# Patient Record
Sex: Male | Born: 1945 | Race: White | Hispanic: No | Marital: Married | Smoking: Former smoker
Health system: Southern US, Community
[De-identification: ages and names within clinical notes are randomized; demographics above are authoritative.]

## PROBLEM LIST (undated history)

## (undated) DIAGNOSIS — K219 Gastro-esophageal reflux disease without esophagitis: Secondary | ICD-10-CM

## (undated) DIAGNOSIS — E119 Type 2 diabetes mellitus without complications: Secondary | ICD-10-CM

## (undated) DIAGNOSIS — E539 Vitamin B deficiency, unspecified: Secondary | ICD-10-CM

## (undated) DIAGNOSIS — J449 Chronic obstructive pulmonary disease, unspecified: Secondary | ICD-10-CM

## (undated) DIAGNOSIS — L308 Other specified dermatitis: Secondary | ICD-10-CM

## (undated) DIAGNOSIS — N182 Chronic kidney disease, stage 2 (mild): Secondary | ICD-10-CM

## (undated) DIAGNOSIS — E559 Vitamin D deficiency, unspecified: Secondary | ICD-10-CM

## (undated) DIAGNOSIS — I10 Essential (primary) hypertension: Secondary | ICD-10-CM

## (undated) DIAGNOSIS — S7291XA Unspecified fracture of right femur, initial encounter for closed fracture: Secondary | ICD-10-CM

## (undated) DIAGNOSIS — C3490 Malignant neoplasm of unspecified part of unspecified bronchus or lung: Secondary | ICD-10-CM

## (undated) DIAGNOSIS — I77811 Abdominal aortic ectasia: Secondary | ICD-10-CM

## (undated) DIAGNOSIS — C61 Malignant neoplasm of prostate: Secondary | ICD-10-CM

## (undated) DIAGNOSIS — F1911 Other psychoactive substance abuse, in remission: Secondary | ICD-10-CM

## (undated) DIAGNOSIS — E785 Hyperlipidemia, unspecified: Secondary | ICD-10-CM

## (undated) DIAGNOSIS — F1021 Alcohol dependence, in remission: Secondary | ICD-10-CM

## (undated) DIAGNOSIS — S72009A Fracture of unspecified part of neck of unspecified femur, initial encounter for closed fracture: Secondary | ICD-10-CM

## (undated) DIAGNOSIS — G479 Sleep disorder, unspecified: Secondary | ICD-10-CM

## (undated) HISTORY — DX: Sleep disorder, unspecified: G47.9

## (undated) HISTORY — DX: Essential (primary) hypertension: I10

## (undated) HISTORY — DX: Gastro-esophageal reflux disease without esophagitis: K21.9

## (undated) HISTORY — DX: Hyperlipidemia, unspecified: E78.5

## (undated) HISTORY — DX: Unspecified fracture of right femur, initial encounter for closed fracture: S72.91XA

## (undated) HISTORY — DX: Other specified dermatitis: L30.8

## (undated) HISTORY — PX: HIP FRACTURE SURGERY: SHX118

## (undated) HISTORY — DX: Fracture of unspecified part of neck of unspecified femur, initial encounter for closed fracture: S72.009A

## (undated) HISTORY — PX: FEMUR FRACTURE SURGERY: SHX633

## (undated) HISTORY — DX: Type 2 diabetes mellitus without complications: E11.9

## (undated) HISTORY — PX: TIBIA FRACTURE SURGERY: SHX806

## (undated) HISTORY — DX: Vitamin D deficiency, unspecified: E55.9

## (undated) HISTORY — PX: COLONOSCOPY: SHX174

## (undated) HISTORY — DX: Vitamin B deficiency, unspecified: E53.9

## (undated) HISTORY — PX: JOINT REPLACEMENT: SHX530

## (undated) HISTORY — DX: Other psychoactive substance abuse, in remission: F19.11

---

## 1997-05-11 DIAGNOSIS — F1911 Other psychoactive substance abuse, in remission: Secondary | ICD-10-CM

## 1997-05-11 HISTORY — DX: Other psychoactive substance abuse, in remission: F19.11

## 1998-03-05 ENCOUNTER — Encounter: Admission: RE | Admit: 1998-03-05 | Discharge: 1998-06-03 | Payer: Self-pay | Admitting: Family Medicine

## 2004-09-26 ENCOUNTER — Ambulatory Visit (HOSPITAL_COMMUNITY): Admission: RE | Admit: 2004-09-26 | Discharge: 2004-09-26 | Payer: Self-pay | Admitting: Gastroenterology

## 2006-02-18 ENCOUNTER — Encounter: Admission: RE | Admit: 2006-02-18 | Discharge: 2006-05-19 | Payer: Self-pay | Admitting: Family Medicine

## 2006-07-06 ENCOUNTER — Encounter: Admission: RE | Admit: 2006-07-06 | Discharge: 2006-10-04 | Payer: Self-pay | Admitting: Family Medicine

## 2006-11-09 ENCOUNTER — Encounter: Admission: RE | Admit: 2006-11-09 | Discharge: 2006-11-09 | Payer: Self-pay | Admitting: Family Medicine

## 2009-02-22 ENCOUNTER — Encounter: Admission: RE | Admit: 2009-02-22 | Discharge: 2009-02-22 | Payer: Self-pay | Admitting: Family Medicine

## 2010-09-26 NOTE — Op Note (Signed)
NAMESEYON, Alan Hurst NO.:  [id number]   MEDICAL RECORD NO.:  [id number]          PATIENT TYPE:  AMB   LOCATION:  ENDO                         FACILITY:  [facility]   PHYSICIAN:  John C. Madilyn Fireman, M.D.    DATE OF BIRTH:  08-06-1945   DATE OF PROCEDURE:  09/26/2004  DATE OF DISCHARGE:                                 OPERATIVE REPORT   PROCEDURE:  Colonoscopy.   INDICATIONS FOR PROCEDURE:  Average risk colon cancer screening.   PROCEDURE:  The patient was placed in the left lateral decubitus position  and placed on the pulse monitor with continuous low-flow oxygen delivered by  nasal cannula and 75 mcg IV fentanyl and 7.5 milligrams IV Versed. Olympus  video colonoscope was inserted into the rectum and advanced to cecum,  confirmed by transillumination of McBurney's point and visualization of  ileocecal valve and appendiceal orifice. Prep was good. The cecum,  ascending, transverse, descending, sigmoid and rectum all appeared normal  with no masses, polyps, diverticula or other mucosal abnormalities. The  rectum appeared normal down to the anus where retroflexed view revealed  small internal hemorrhoids. The scope was then withdrawn and the patient  returned to the recovery room in stable condition.  He tolerated the  procedure well. There were no immediate complications.   IMPRESSION:  Internal hemorrhoids, otherwise normal study.   PLAN:  Next colonoscopy within 10 years, and consider sigmoidoscopy or  Hemoccults and five years.      JCH/MEDQ  D:  09/26/2004  T:  09/26/2004  Job:  [id number]   cc:   Talmadge Coventry, M.D.  [address]  [place]  [place] [address]  Fax: [phone]

## 2011-05-14 ENCOUNTER — Ambulatory Visit (INDEPENDENT_AMBULATORY_CARE_PROVIDER_SITE_OTHER): Payer: Medicare Other

## 2011-05-14 DIAGNOSIS — R319 Hematuria, unspecified: Secondary | ICD-10-CM

## 2011-05-14 DIAGNOSIS — J111 Influenza due to unidentified influenza virus with other respiratory manifestations: Secondary | ICD-10-CM

## 2011-05-14 DIAGNOSIS — M549 Dorsalgia, unspecified: Secondary | ICD-10-CM

## 2015-06-28 ENCOUNTER — Encounter (HOSPITAL_COMMUNITY): Payer: Self-pay | Admitting: *Deleted

## 2015-06-28 ENCOUNTER — Emergency Department (HOSPITAL_COMMUNITY)
Admission: EM | Admit: 2015-06-28 | Discharge: 2015-06-28 | Disposition: A | Discharge status: Home / Self Care | Payer: Medicare Other | Attending: Physician Assistant | Admitting: Physician Assistant

## 2015-06-28 DIAGNOSIS — H5711 Ocular pain, right eye: Secondary | ICD-10-CM | POA: Diagnosis present

## 2015-06-28 DIAGNOSIS — H53149 Visual discomfort, unspecified: Secondary | ICD-10-CM | POA: Diagnosis not present

## 2015-06-28 MED ORDER — FLUORESCEIN SODIUM 1 MG OP STRP
1.0000 | ORAL_STRIP | Freq: Once | OPHTHALMIC | Status: DC
  Filled 2015-06-28: qty 1

## 2015-06-28 MED ORDER — ERYTHROMYCIN 5 MG/GM OP OINT: TOPICAL_OINTMENT | OPHTHALMIC | Status: DC

## 2015-06-28 MED ORDER — ERYTHROMYCIN 5 MG/GM OP OINT
TOPICAL_OINTMENT | Freq: Once | OPHTHALMIC | Status: AC
  Administered 2015-06-28: 17:00:00 via OPHTHALMIC
  Filled 2015-06-28: qty 3.5

## 2015-06-28 MED ORDER — TETRACAINE HCL 0.5 % OP SOLN
2.0000 [drp] | Freq: Once | OPHTHALMIC | Status: DC
  Filled 2015-06-28: qty 4

## 2015-06-28 NOTE — Discharge Instructions (Signed)
You have been seen today for eye pain. Your exam showed no abnormalities. Follow up with ophthalmology as soon as possible. Call the office for an appointment as soon as you leave the ED. Follow up with PCP as needed. Return to ED should symptoms worsen. Use the erythromycin ointment 4 times a day for the next 5 days. Ibuprofen or Tylenol may be used for pain.

## 2015-06-28 NOTE — ED Notes (Signed)
Pt thinks he may have scratched rt eye in sleep Monday night, has had pain since,

## 2015-06-28 NOTE — ED Provider Notes (Signed)
CSN: [id number]     Arrival date & time 06/28/15  1326 History  By signing my name below, I, Rayna Sexton, attest that this documentation has been prepared under the direction and in the presence of Shawn C. Joy, PA-C. Electronically Signed: Rayna Sexton, ED Scribe. 06/28/2015. 3:24 PM.    Chief Complaint  Patient presents with  . Eye Pain   The history is provided by the patient. No language interpreter was used.    HPI Comments: Alan Hurst is a 70 y.o. male with a PMHx of pre-diabetes who presents to the Emergency Department complaining of constant, waxing and waning, 3/10, right eye pain onset 4 days ago. Pt is unsure of the cause of his pain noting that it intermittently worsens from his current 3/10 pain to 8/10. He reports associated eye watering and photophobia. He wears prescription eyewear but denies wearing contacts. Pt denies a hx of similar symptoms. Pt last saw his optometrist 8 months ago (Dr. Jodene Nam) whom he sees annually. He denies a PMHx of stroke or HTN. He denies HA or any other associated symptoms at this time.    History reviewed. No pertinent past medical history. Past Surgical History  Procedure Laterality Date  . Joint replacement     No family history on file. Social History  Substance Use Topics  . Smoking status: Never Smoker   . Smokeless tobacco: None  . Alcohol Use: No    Review of Systems  Constitutional: Negative for fever and chills.  Eyes: Positive for photophobia and pain. Negative for discharge, redness, itching and visual disturbance.  Neurological: Negative for headaches.    Allergies  Review of patient's allergies indicates no known allergies.  Home Medications   Prior to Admission medications   Medication Sig Start Date End Date Taking? Authorizing Provider  erythromycin ophthalmic ointment Place a 1/2 inch ribbon of ointment into the lower eyelid. Apply 4 times a day for the next 5 days. 06/28/15   Shawn C Joy, PA-C    BP 155/81 mmHg  Pulse 84  Temp(Src) 97.4 F (36.3 C) (Oral)  Resp 16  SpO2 98%    Physical Exam  Constitutional: He is oriented to person, place, and time. He appears well-developed and well-nourished.  HENT:  Head: Normocephalic and atraumatic.  No periorbital edema.  Eyes: Conjunctivae and EOM are normal. Pupils are equal, round, and reactive to light. Right eye exhibits no discharge.  Increased pain with leftward gaze of the right eye Tonometer reading: Left eye: 14 mmHg  Right eye: 16 mmHg [place] Lamp exam shows no increased uptake of fluorescein. Slit lamp exam was also performed with no signs of corneal abrasion or ulcer, iritis, anterior chamber damage, or globe damage.     Visual Acuity  Right Eye Distance: 20/40 Left Eye Distance: 20/70 Bilateral Distance:    Right Eye Near:   Left Eye Near:    Bilateral Near:       Neck: Normal range of motion. Neck supple.  Cardiovascular: Normal rate.   Pulmonary/Chest: Effort normal. No respiratory distress.  Neurological: He is alert and oriented to person, place, and time.  Skin: Skin is warm and dry.  Psychiatric: He has a normal mood and affect.  Nursing note and vitals reviewed.   ED Course  Procedures  DIAGNOSTIC STUDIES: Oxygen Saturation is 98% on RA, normal by my interpretation.    COORDINATION OF CARE: 3:20 PM Discussed next steps with pt. He verbalized understanding and is agreeable with the  plan.   SLIT LAMP EXAM: Tetracaine 2 drops used.  Lids everted and swept for exam, no evidence of foreign body.  Conjunctivae: No injection Cornea: No evidence of abrasion or corneal ulcer EOM: Intact  Pupils: PERRL  Fluorescein uptake: none   Patient tolerated procedure well without immediate complications.     Labs Review Labs Reviewed - No data to display  Imaging Review No results found.   EKG Interpretation None      MDM   Final diagnoses:  Eye pain, right    [name] presents with  right eye pain for the last 5 days.  Findings and plan of care discussed with Courteney Lyn Mackuen, MD.   No specific abnormal findings on physical or slit lamp exam. Normal pressures. No evidence of corneal ulcer, acute angle glaucoma, infarction, or any other serious issues.  Erythromycin ointment and follow-up with ophthalmology.  I personally performed the services described in this documentation, which was scribed in my presence. The recorded information has been reviewed and is accurate.   Lorayne Bender, PA-C 06/28/15 [place], MD 06/28/15 2246

## 2015-12-05 ENCOUNTER — Ambulatory Visit (INDEPENDENT_AMBULATORY_CARE_PROVIDER_SITE_OTHER): Payer: Medicare Other | Admitting: Family Medicine

## 2015-12-05 ENCOUNTER — Encounter: Payer: Self-pay | Admitting: Family Medicine

## 2015-12-05 VITALS — BP 112/71 | HR 91 | Ht 68.0 in | Wt 200.8 lb

## 2015-12-05 DIAGNOSIS — N183 Chronic kidney disease, stage 3 unspecified: Secondary | ICD-10-CM | POA: Insufficient documentation

## 2015-12-05 DIAGNOSIS — K219 Gastro-esophageal reflux disease without esophagitis: Secondary | ICD-10-CM

## 2015-12-05 DIAGNOSIS — L308 Other specified dermatitis: Secondary | ICD-10-CM | POA: Insufficient documentation

## 2015-12-05 DIAGNOSIS — Z87891 Personal history of nicotine dependence: Secondary | ICD-10-CM | POA: Insufficient documentation

## 2015-12-05 DIAGNOSIS — F1021 Alcohol dependence, in remission: Secondary | ICD-10-CM | POA: Insufficient documentation

## 2015-12-05 DIAGNOSIS — E669 Obesity, unspecified: Secondary | ICD-10-CM

## 2015-12-05 DIAGNOSIS — E0800 Diabetes mellitus due to underlying condition with hyperosmolarity without nonketotic hyperglycemic-hyperosmolar coma (NKHHC): Secondary | ICD-10-CM

## 2015-12-05 DIAGNOSIS — E785 Hyperlipidemia, unspecified: Secondary | ICD-10-CM | POA: Diagnosis not present

## 2015-12-05 DIAGNOSIS — E119 Type 2 diabetes mellitus without complications: Secondary | ICD-10-CM | POA: Insufficient documentation

## 2015-12-05 DIAGNOSIS — E539 Vitamin B deficiency, unspecified: Secondary | ICD-10-CM

## 2015-12-05 DIAGNOSIS — E559 Vitamin D deficiency, unspecified: Secondary | ICD-10-CM

## 2015-12-05 DIAGNOSIS — G479 Sleep disorder, unspecified: Secondary | ICD-10-CM | POA: Diagnosis not present

## 2015-12-05 HISTORY — DX: Other specified dermatitis: L30.8

## 2015-12-05 HISTORY — DX: Vitamin B deficiency, unspecified: E53.9

## 2015-12-05 HISTORY — DX: Alcohol dependence, in remission: F10.21

## 2015-12-05 HISTORY — DX: Vitamin D deficiency, unspecified: E55.9

## 2015-12-05 HISTORY — DX: Gastro-esophageal reflux disease without esophagitis: K21.9

## 2015-12-05 HISTORY — DX: Hyperlipidemia, unspecified: E78.5

## 2015-12-05 HISTORY — DX: Sleep disorder, unspecified: G47.9

## 2015-12-05 LAB — POCT GLYCOSYLATED HEMOGLOBIN (HGB A1C): HEMOGLOBIN A1C: 6

## 2015-12-05 NOTE — Progress Notes (Signed)
New patient office visit note:  Impression and Recommendations:    1. Diabetes mellitus due to underlying condition with hyperosmolarity without coma, without long-term current use of insulin ([place])   2. HLD (hyperlipidemia)   3. Gastroesophageal reflux disease, esophagitis presence not specified   4. Sleep disorder   5. Vitamin D insufficiency   6. Vitamin B deficiency   7. Recovering alcoholic in remission ([place])   8. History of tobacco abuse   9. Obese      Point of care A1c in the office today showed an A1c of 6.0.  Patient told me he prefers to stay on at least one tablet a day of the metformin. Continue to monitor sugars  Continue lovastatin and fish oil until we obtain values; prudent diet discussed  Continue omeprazole, symptoms well-controlled.  Melatonin works well, continue  Continue vitamin D supplementation. We will check labs in near future  Check vitamin B12 levels in the near future  We'll check liver enzymes etc.  Patient will get me med records from prior PCP to ensure he had for instance AAA ultrasound screening, low dose CT scan etc. Etc.  Prudent diet and exercise discussed with patient  Return in about 3 weeks (around 12/26/2015) for Fasting blood work, then OV with me one wk later..  The patient was counseled, risk factors were discussed, anticipatory guidance given.  Gross side effects, risk and benefits, and alternatives of medications discussed with patient.  Patient is aware that all medications have potential side effects and we are unable to predict every side effect or drug-drug interaction that may occur.  Expresses verbal understanding and consents to current therapy plan and treatment regimen.  Please see AVS handed out to patient at the end of our visit for further patient instructions/ counseling done pertaining to today's office visit.    Note: This document was prepared using Dragon voice recognition software and may include  unintentional dictation errors.  ----------------------------------------------------------------------------------------------------------------------    Subjective:    Chief Complaint  Patient presents with  . Establish Care    HPI: Alan Hurst is a pleasant 70 y.o. male who presents to [place] [facility] today to review their medical history with me and establish care.    Prior primary care doctor [facility] reg physicians [place].   Dm A1c lab- done about 3 months- have been under 6.2 over the past year. No high or lows.  Denies any diabetic sequela such as neuropathy, renal damage or visual changes etc.  Patient does not engage in regular exercise.  No other specialists besides eye doc.   Denies history of hypertension   Patient Active Problem List   Diagnosis Date Noted  . Diabetes mellitus 12/05/2015  . HLD (hyperlipidemia) 12/05/2015  . GERD (gastroesophageal reflux disease) 12/05/2015  . 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 Vitamin D insufficiency 12/05/2015  . Vitamin B deficiency 12/05/2015  . Recovering alcoholic in remission ([place]) 12/05/2015  . History of tobacco abuse 12/05/2015  . Obese 12/05/2015     Past Medical History:  Diagnosis Date  . Diabetes mellitus 12/05/2015   10 yrs- well controlled, no sequelae  . Diabetes mellitus without complication ([place])   . GERD (gastroesophageal reflux disease) 12/05/2015   Well controlled on meds  . HLD (hyperlipidemia) 12/05/2015   10+ yrs at least.   . Hyperlipidemia   . 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 Sleep disorder  12/05/2015  . Vitamin B deficiency 12/05/2015  . Vitamin D insufficiency 12/05/2015     Past Surgical History:  Procedure Laterality Date  . JOINT REPLACEMENT Bilateral    hips     Family History  Problem Relation Age of Onset  . Alcohol abuse Mother   . Alcohol abuse Father   . Congestive Heart Failure Father   . Kidney  disease Father      History  Drug Use No    History  Alcohol Use No    History  Smoking Status  . Former Smoker  . Quit date: 05/11/1993  Smokeless Tobacco  . Never Used    Patient's Medications  New Prescriptions   No medications on file  Previous Medications   ASPIRIN EC 81 MG TABLET    Take 1 tablet by mouth daily.   B COMPLEX VITAMINS (VITAMIN-B COMPLEX) TABS    Take 1 tablet by mouth daily.   CHOLECALCIFEROL (VITAMIN D) 2000 UNITS TABLET    Take 1 tablet by mouth daily.   LOVASTATIN (MEVACOR) 40 MG TABLET    Take 40 mg by mouth daily.   MELATONIN 1 MG TABS    Take 3 tablets by mouth at bedtime.   METFORMIN (GLUCOPHAGE) 500 MG TABLET    Take 500 mg by mouth daily.   OMEGA-3 FATTY ACIDS (FISH OIL) 1000 MG CAPS    Take 1 capsule by mouth daily.   OMEPRAZOLE (PRILOSEC) 20 MG CAPSULE    Take 20 mg by mouth daily.  Modified Medications   No medications on file  Discontinued Medications   ERYTHROMYCIN OPHTHALMIC OINTMENT    Place a 1/2 inch ribbon of ointment into the lower eyelid. Apply 4 times a day for the next 5 days.    Allergies: Review of patient's allergies indicates no known allergies.     ROS Review of Systems: General:   Denies fever, chills, unexplained weight loss.  Optho/Auditory:   Denies visual changes, blurred vision/LOV Respiratory:   Denies SOB, DOE.  Cardiovascular:   Denies chest pain, palpitations, new onset peripheral edema  Gastrointestinal:   Denies nausea, vomiting, diarrhea.  Genitourinary:    Denies dysuria, increased frequency, flank pain.  Endocrine:     Denies hot or cold intolerance, polyuria, polydipsia. Musculoskeletal:   Denies unexplained myalgias, joint swelling, unexplained arthralgias, gait problems.  Skin:  Denies rash, suspicious lesions Neurological:     Denies dizziness, unexplained weakness, lightheadedness, numbness  Psychiatric/Behavioral:   Denies mood changes, suicidal or homicidal ideations,  hallucinations  Objective:   Blood pressure 112/71, pulse 91, height '5\' 8"'$  (1.727 m), weight 200 lb 12.8 oz (91.1 kg). Body mass index is 30.53 kg/m.   General: Well Developed, well nourished, and in no acute distress.  Neuro: Alert and oriented x3, extra-ocular muscles intact, sensation grossly intact.  HEENT: Normocephalic, atraumatic, pupils equal round reactive to light, neck supple, no gross masses, no carotid bruits, no JVD apprec Skin: no gross suspicious lesions or rashes  Cardiac: Regular rate and rhythm, no murmurs rubs or gallops.  Respiratory: Essentially clear to auscultation bilaterally. Not using accessory muscles, speaking in full sentences.  Abdominal: Soft, not grossly distended Musculoskeletal: Ambulates w/o diff, FROM * 4 ext.  Vasc: less 2 sec cap RF, warm and pink  Psych:  No HI/SI, judgement and insight good.

## 2015-12-05 NOTE — Patient Instructions (Signed)
Diabetes and Exercise Exercising regularly is important. It is not just about losing weight. It has many health benefits, such as:  Improving your overall fitness, flexibility, and endurance.  Increasing your bone density.  Helping with weight control.  Decreasing your body fat.  Increasing your muscle strength.  Reducing stress and tension.  Improving your overall health. People with diabetes who exercise gain additional benefits because exercise:  Reduces appetite.  Improves the body's use of blood sugar (glucose).  Helps lower or control blood glucose.  Decreases blood pressure.  Helps control blood lipids (such as cholesterol and triglycerides).  Improves the body's use of the hormone insulin by:  Increasing the body's insulin sensitivity.  Reducing the body's insulin needs.  Decreases the risk for heart disease because exercising:  Lowers cholesterol and triglycerides levels.  Increases the levels of good cholesterol (such as high-density lipoproteins [HDL]) in the body.  Lowers blood glucose levels. YOUR ACTIVITY PLAN  Choose an activity that you enjoy, and set realistic goals. To exercise safely, you should begin practicing any new physical activity slowly, and gradually increase the intensity of the exercise over time. Your health care provider or diabetes educator can help create an activity plan that works for you. General recommendations include:  Encouraging children to engage in at least 60 minutes of physical activity each day.  Stretching and performing strength training exercises, such as yoga or weight lifting, at least 2 times per week.  Performing a total of at least 150 minutes of moderate-intensity exercise each week, such as brisk walking or water aerobics.  Exercising at least 3 days per week, making sure you allow no more than 2 consecutive days to pass without exercising.  Avoiding long periods of inactivity (90 minutes or more). When you  have to spend an extended period of time sitting down, take frequent breaks to walk or stretch. RECOMMENDATIONS FOR EXERCISING WITH TYPE 1 OR TYPE 2 DIABETES   Check your blood glucose before exercising. If blood glucose levels are greater than 240 mg/dL, check for urine ketones. Do not exercise if ketones are present.  Avoid injecting insulin into areas of the body that are going to be exercised. For example, avoid injecting insulin into:  The arms when playing tennis.  The legs when jogging.  Keep a record of:  Food intake before and after you exercise.  Expected peak times of insulin action.  Blood glucose levels before and after you exercise.  The type and amount of exercise you have done.  Review your records with your health care provider. Your health care provider will help you to develop guidelines for adjusting food intake and insulin amounts before and after exercising.  If you take insulin or oral hypoglycemic agents, watch for signs and symptoms of hypoglycemia. They include:  Dizziness.  Shaking.  Sweating.  Chills.  Confusion.  Drink plenty of water while you exercise to prevent dehydration or heat stroke. Body water is lost during exercise and must be replaced.  Talk to your health care provider before starting an exercise program to make sure it is safe for you. Remember, almost any type of activity is better than none.   This information is not intended to replace advice given to you by your health care provider. Make sure you discuss any questions you have with your health care provider.   Document Released: 07/18/2003 Document Revised: 09/11/2014 Document Reviewed: 10/04/2012 Elsevier Interactive Patient Education 2016 [place]. Diabetes and Foot Care Diabetes may cause you  to have problems because of poor blood supply (circulation) to your feet and legs. This may cause the skin on your feet to become thinner, break easier, and heal more slowly.  Your skin may become dry, and the skin may peel and crack. You may also have nerve damage in your legs and feet causing decreased feeling in them. You may not notice minor injuries to your feet that could lead to infections or more serious problems. Taking care of your feet is one of the most important things you can do for yourself.  HOME CARE INSTRUCTIONS  Wear shoes at all times, even in the house. Do not go barefoot. Bare feet are easily injured.  Check your feet daily for blisters, cuts, and redness. If you cannot see the bottom of your feet, use a mirror or ask someone for help.  Wash your feet with warm water (do not use hot water) and mild soap. Then pat your feet and the areas between your toes until they are completely dry. Do not soak your feet as this can dry your skin.  Apply a moisturizing lotion or petroleum jelly (that does not contain alcohol and is unscented) to the skin on your feet and to dry, brittle toenails. Do not apply lotion between your toes.  Trim your toenails straight across. Do not dig under them or around the cuticle. File the edges of your nails with an emery board or nail file.  Do not cut corns or calluses or try to remove them with medicine.  Wear clean socks or stockings every day. Make sure they are not too tight. Do not wear knee-high stockings since they may decrease blood flow to your legs.  Wear shoes that fit properly and have enough cushioning. To break in new shoes, wear them for just a few hours a day. This prevents you from injuring your feet. Always look in your shoes before you put them on to be sure there are no objects inside.  Do not cross your legs. This may decrease the blood flow to your feet.  If you find a minor scrape, cut, or break in the skin on your feet, keep it and the skin around it clean and dry. These areas may be cleansed with mild soap and water. Do not cleanse the area with peroxide, alcohol, or iodine.  When you remove an  adhesive bandage, be sure not to damage the skin around it.  If you have a wound, look at it several times a day to make sure it is healing.  Do not use heating pads or hot water bottles. They may burn your skin. If you have lost feeling in your feet or legs, you may not know it is happening until it is too late.  Make sure your health care provider performs a complete foot exam at least annually or more often if you have foot problems. Report any cuts, sores, or bruises to your health care provider immediately. SEEK MEDICAL CARE IF:   You have an injury that is not healing.  You have cuts or breaks in the skin.  You have an ingrown nail.  You notice redness on your legs or feet.  You feel burning or tingling in your legs or feet.  You have pain or cramps in your legs and feet.  Your legs or feet are numb.  Your feet always feel cold. SEEK IMMEDIATE MEDICAL CARE IF:   There is increasing redness, swelling, or pain in or around a  wound.  There is a red line that goes up your leg.  Pus is coming from a wound.  You develop a fever or as directed by your health care provider.  You notice a bad smell coming from an ulcer or wound.   This information is not intended to replace advice given to you by your health care provider. Make sure you discuss any questions you have with your health care provider.   Document Released: 04/24/2000 Document Revised: 12/28/2012 Document Reviewed: 10/04/2012 Elsevier Interactive Patient Education [employer].

## 2015-12-26 ENCOUNTER — Other Ambulatory Visit (INDEPENDENT_AMBULATORY_CARE_PROVIDER_SITE_OTHER): Payer: Medicare Other

## 2015-12-26 DIAGNOSIS — E785 Hyperlipidemia, unspecified: Secondary | ICD-10-CM

## 2015-12-26 DIAGNOSIS — E0800 Diabetes mellitus due to underlying condition with hyperosmolarity without nonketotic hyperglycemic-hyperosmolar coma (NKHHC): Secondary | ICD-10-CM | POA: Diagnosis not present

## 2015-12-26 DIAGNOSIS — E539 Vitamin B deficiency, unspecified: Secondary | ICD-10-CM

## 2015-12-26 DIAGNOSIS — G479 Sleep disorder, unspecified: Secondary | ICD-10-CM

## 2015-12-26 DIAGNOSIS — E559 Vitamin D deficiency, unspecified: Secondary | ICD-10-CM

## 2015-12-26 DIAGNOSIS — K219 Gastro-esophageal reflux disease without esophagitis: Secondary | ICD-10-CM

## 2015-12-27 LAB — COMPLETE METABOLIC PANEL WITH GFR
ALT: 15 U/L (ref 9–46)
AST: 18 U/L (ref 10–35)
Albumin: 3.8 g/dL (ref 3.6–5.1)
Alkaline Phosphatase: 47 U/L (ref 40–115)
BILIRUBIN TOTAL: 0.5 mg/dL (ref 0.2–1.2)
BUN: 16 mg/dL (ref 7–25)
CALCIUM: 9.2 mg/dL (ref 8.6–10.3)
CHLORIDE: 105 mmol/L (ref 98–110)
CO2: 24 mmol/L (ref 20–31)
Creat: 1.27 mg/dL — ABNORMAL HIGH (ref 0.70–1.25)
GFR, EST AFRICAN AMERICAN: 66 mL/min (ref >=60)
GFR, EST NON AFRICAN AMERICAN: 57 mL/min — AB (ref >=60)
Glucose, Bld: 108 mg/dL — ABNORMAL HIGH (ref 65–99)
Potassium: 4.4 mmol/L (ref 3.5–5.3)
Sodium: 137 mmol/L (ref 135–146)
Total Protein: 6.7 g/dL (ref 6.1–8.1)

## 2015-12-27 LAB — LIPID PANEL
CHOLESTEROL: 138 mg/dL (ref 125–200)
HDL: 51 mg/dL (ref >=40)
LDL CALC: 62 mg/dL (ref <130)
TRIGLYCERIDES: 123 mg/dL (ref <150)
Total CHOL/HDL Ratio: 2.7 Ratio (ref <=5.0)
VLDL: 25 mg/dL (ref <30)

## 2015-12-27 LAB — CBC WITH DIFFERENTIAL/PLATELET
BASOS ABS: 64 {cells}/uL (ref 0–200)
BASOS PCT: 1 %
EOS ABS: 192 {cells}/uL (ref 15–500)
Eosinophils Relative: 3 %
HEMATOCRIT: 45.4 % (ref 38.5–50.0)
HEMOGLOBIN: 14.9 g/dL (ref 13.2–17.1)
LYMPHS ABS: 2496 {cells}/uL (ref 850–3900)
Lymphocytes Relative: 39 %
MCH: 27.2 pg (ref 27.0–33.0)
MCHC: 32.8 g/dL (ref 32.0–36.0)
MCV: 82.8 fL (ref 80.0–100.0)
MPV: 9.2 fL (ref 7.5–12.5)
Monocytes Absolute: 704 cells/uL (ref 200–950)
Monocytes Relative: 11 %
NEUTROS ABS: 2944 {cells}/uL (ref 1500–7800)
Neutrophils Relative %: 46 %
PLATELETS: 270 10*3/uL (ref 140–400)
RBC: 5.48 MIL/uL (ref 4.20–5.80)
RDW: 14.5 % (ref 11.0–15.0)
WBC: 6.4 10*3/uL (ref 3.8–10.8)

## 2015-12-27 LAB — MICROALBUMIN / CREATININE URINE RATIO
Creatinine, Urine: 88 mg/dL (ref 20–370)
Microalb, Ur: <0.2 mg/dL

## 2015-12-27 LAB — VITAMIN D 25 HYDROXY (VIT D DEFICIENCY, FRACTURES): Vit D, 25-Hydroxy: 42 ng/mL (ref 30–100)

## 2015-12-27 LAB — TSH: TSH: 1.29 m[IU]/L (ref 0.40–4.50)

## 2015-12-27 LAB — VITAMIN B12: VITAMIN B 12: 400 pg/mL (ref 200–1100)

## 2016-01-02 ENCOUNTER — Encounter: Payer: Self-pay | Admitting: Family Medicine

## 2016-01-02 ENCOUNTER — Ambulatory Visit (INDEPENDENT_AMBULATORY_CARE_PROVIDER_SITE_OTHER): Payer: Medicare Other | Admitting: Family Medicine

## 2016-01-02 VITALS — BP 109/71 | HR 93 | Ht 68.0 in | Wt 200.9 lb

## 2016-01-02 DIAGNOSIS — N186 End stage renal disease: Secondary | ICD-10-CM

## 2016-01-02 DIAGNOSIS — Z992 Dependence on renal dialysis: Secondary | ICD-10-CM

## 2016-01-02 DIAGNOSIS — Z87891 Personal history of nicotine dependence: Secondary | ICD-10-CM

## 2016-01-02 DIAGNOSIS — E559 Vitamin D deficiency, unspecified: Secondary | ICD-10-CM

## 2016-01-02 DIAGNOSIS — E1122 Type 2 diabetes mellitus with diabetic chronic kidney disease: Secondary | ICD-10-CM | POA: Diagnosis not present

## 2016-01-02 DIAGNOSIS — E669 Obesity, unspecified: Secondary | ICD-10-CM

## 2016-01-02 DIAGNOSIS — N189 Chronic kidney disease, unspecified: Secondary | ICD-10-CM | POA: Insufficient documentation

## 2016-01-02 DIAGNOSIS — N183 Chronic kidney disease, stage 3 unspecified: Secondary | ICD-10-CM

## 2016-01-02 DIAGNOSIS — F1021 Alcohol dependence, in remission: Secondary | ICD-10-CM

## 2016-01-02 DIAGNOSIS — E539 Vitamin B deficiency, unspecified: Secondary | ICD-10-CM

## 2016-01-02 DIAGNOSIS — E785 Hyperlipidemia, unspecified: Secondary | ICD-10-CM

## 2016-01-02 DIAGNOSIS — K219 Gastro-esophageal reflux disease without esophagitis: Secondary | ICD-10-CM

## 2016-01-02 NOTE — Assessment & Plan Note (Addendum)
Encouraged weight loss; counseling done; handouts provided

## 2016-01-02 NOTE — Assessment & Plan Note (Signed)
>>  ASSESSMENT AND PLAN FOR TYPE 2 DIABETES MELLITUS WITHOUT COMPLICATION, WITHOUT LONG-TERM CURRENT USE OF INSULIN ([place]) WRITTEN ON 01/02/2016 10:08 PM BY OPALSKI, DEBORAH, DO  10 yrs- well controlled, no sequelae  Encouraged to monitor sugars.

## 2016-01-02 NOTE — Patient Instructions (Addendum)
We discussed you going on a B complex vitamin and go off your B6. Also men's once a day Centrum Silver.     - drink water- 1/2 wt in ounces water per day. 200lbs= 100 oz/waterday  Exercise- this will help your joints, your heart/ lungs and esp blood sugar and kidney function--> organs or always happier when they get more blood flow to them through exercise  - Make sure you try to control your blood sugars as much as possible which you have been doing.    Chronic Kidney Disease Chronic kidney disease occurs when the kidneys are damaged over a long period. The kidneys are two organs that lie on either side of the spine between the middle of the back and the front of the abdomen. The kidneys:  Remove wastes and extra water from the blood.  Produce important hormones. These help keep bones strong, regulate blood pressure, and help create red blood cells.  Balance the fluids and chemicals in the blood and tissues. A small amount of kidney damage may not cause problems, but a large amount of damage may make it difficult or impossible for the kidneys to work the way they should. If steps are not taken to slow down the kidney damage or stop it from getting worse, the kidneys may stop working permanently. Most of the time, chronic kidney disease does not go away. However, it can often be controlled, and those with the disease can usually live normal lives. CAUSES The most common causes of chronic kidney disease are diabetes and high blood pressure (hypertension). Chronic kidney disease may also be caused by:  Diseases that cause the kidneys' filters to become inflamed.  Diseases that affect the immune system.  Genetic diseases.  Medicines that damage the kidneys, such as anti-inflammatory medicines.  Poisoning or exposure to toxic substances.  A reoccurring kidney or urinary infection.  A problem with urine flow. This may be caused by:  Cancer.  Kidney stones.  An enlarged prostate  in males. SIGNS AND SYMPTOMS Because the kidney damage in chronic kidney disease occurs slowly, symptoms develop slowly and may not be obvious until the kidney damage becomes severe. A person may have a kidney disease for years without showing any symptoms. Symptoms can include:  Swelling (edema) of the legs, ankles, or feet.  Tiredness (lethargy).  Nausea or vomiting.  Confusion.  Problems with urination, such as:  Decreased urine production.  Frequent urination, especially at night.  Frequent accidents in children who are potty trained.  Muscle twitches and cramps.  Shortness of breath.  Weakness.  Persistent itchiness.  Loss of appetite.  Metallic taste in the mouth.  Trouble sleeping.  Slowed development in children.  Short stature in children. DIAGNOSIS Chronic kidney disease may be detected and diagnosed by tests, including blood, urine, imaging, or kidney biopsy tests. TREATMENT Most chronic kidney diseases cannot be cured. Treatment usually involves relieving symptoms and preventing or slowing the progression of the disease. Treatment may include:  A special diet. You may need to avoid alcohol and foods thatare salty and high in potassium.  Medicines. These may:  Lower blood pressure.  Relieve anemia.  Relieve swelling.  Protect the bones. HOME CARE INSTRUCTIONS  Follow your prescribed diet. Your health care provider may instruct you to limit daily salt (sodium) and protein intake.  Take medicines only as directed by your health care provider. Do not take any new medicines (prescription, over-the-counter, or nutritional supplements) unless approved by your health care provider.  Many medicines can worsen your kidney damage or need to have the dose adjusted.   Quit smoking if you smoke. Talk to your health care provider about a smoking cessation program.  Keep all follow-up visits as directed by your health care provider.  Monitor your blood  pressure.  Start or continue an exercise plan.  Get immunizations as directed by your health care provider.  Take vitamin and mineral supplements as directed by your health care provider. SEEK IMMEDIATE MEDICAL CARE IF:  Your symptoms get worse or you develop new symptoms.  You develop symptoms of end-stage kidney disease. These include:  Headaches.  Abnormally dark or light skin.  Numbness in the hands or feet.  Easy bruising.  Frequent hiccups.  Menstruation stops.  You have a fever.  You have decreased urine production.  You havepain or bleeding when urinating. MAKE SURE YOU:  Understand these instructions.  Will watch your condition.  Will get help right away if you are not doing well or get worse. FOR MORE INFORMATION   American Association of Kidney Patients: BombTimer.gl  National Kidney Foundation: www.kidney.[place]: https://mathis.com/  [facility]: www.lifeoptions.org and www.kidneyschool.org   This information is not intended to replace advice given to you by your health care provider. Make sure you discuss any questions you have with your health care provider.   Document Released: 02/04/2008 Document Revised: 05/18/2014 Document Reviewed: 12/25/2011 Elsevier Interactive Patient Education [employer].

## 2016-01-02 NOTE — Progress Notes (Signed)
Assessment and plan:  1. Controlled type 2 diabetes mellitus with chronic kidney disease on chronic dialysis, without long-term current use of insulin ([place])   2. Chronic kidney disease, stage 3 (moderate)   3. Obese   4. HLD (hyperlipidemia)   5. Recovering alcoholic in remission ([place])   6. History of tobacco abuse   7. Vitamin B deficiency   8. Vitamin D insufficiency   9. Gastroesophageal reflux disease, esophagitis presence not specified    - Blood pressure and blood sugars well-controlled, continue current medications.  - Extensive counseling regarding saturated and Transfats, low carbohydrate diet, effects of diabetes on blood vessels and the body-kidneys, cardiovascular organs etc.  - We discussed you going on a B complex vitamin and go off your B6- which he is not sure why he is on it.    Also take a men's once a day Centrum Silver.   - drink water- try to drink 1/2 wt in ounces water per day. Ie: 200lbs= 100 oz/waterday  - Exercise!- this will help your joints, your heart/ lungs and esp blood sugar and kidney function--> organs or always happier when they get more blood flow to them through exercise  - Make sure you try to control your blood sugars as much as possible which you have been doing- this will help your kidney disease.  Pt was in the office today for 40+ minutes, with over 50% time spent in face to face counseling of various medical concerns and in coordination of care Patient's Medications  New Prescriptions   No medications on file  Previous Medications   ASPIRIN EC 81 MG TABLET    Take 1 tablet by mouth daily.   B COMPLEX VITAMINS (VITAMIN-B COMPLEX) TABS    Take 1 tablet by mouth daily.   CHOLECALCIFEROL (VITAMIN D) 2000 UNITS TABLET    Take 1 tablet by mouth daily.   LISINOPRIL (PRINIVIL,ZESTRIL) 2.5 MG TABLET    Take 2.5 mg by mouth daily.   LOVASTATIN (MEVACOR) 40 MG TABLET    Take 40 mg  by mouth daily.   LOVASTATIN (MEVACOR) 40 MG TABLET    Take 40 mg by mouth daily.   MELATONIN 1 MG TABS    Take 3 tablets by mouth at bedtime.   METFORMIN (GLUCOPHAGE) 500 MG TABLET    Take 500 mg by mouth daily.   OMEGA-3 FATTY ACIDS (FISH OIL) 1000 MG CAPS    Take 1 capsule by mouth daily.   OMEPRAZOLE (PRILOSEC) 20 MG CAPSULE    Take 20 mg by mouth daily.  Modified Medications   No medications on file  Discontinued Medications   No medications on file    Return in about 2 months (around 03/06/2016) for Diabetes, Follow-up of current medical issues.  Anticipatory guidance and routine counseling done re: condition, txmnt options and need for follow up. All questions of patient's were answered.   Gross side effects, risk and benefits, and alternatives of medications discussed with patient.  Patient is aware that all medications have potential side effects and we are unable to predict every sideeffect or drug-drug interaction that may occur.  Expresses verbal understanding and consents to current therapy plan and treatment regiment.  Please see AVS handed out to patient at the end of our visit for additional patient instructions/ counseling done pertaining to today's office visit.  Note: This document was prepared using Dragon voice recognition software and may include unintentional dictation errors.   ----------------------------------------------------------------------------------------------------------------------  Subjective:   CC: Alan Hurst is a 70 y.o. male who presents to [place] at [facility] today for    Review of all of his recent labs that were done and general health counseling and promotion.  Diabetes-  type II. A1c well controlled and on metformin.  Denies side effects. Denies any high or lows or symptoms thereof. He has not checked his sugars regular. He denies any numbness or tingling in his extremities, visual changes etc  - Patient still  not engaging in regular exercise.     Wt Readings from Last 3 Encounters:  01/02/16 200 lb 14.4 oz (91.1 kg)  12/05/15 200 lb 12.8 oz (91.1 kg)   BP Readings from Last 3 Encounters:  01/02/16 109/71  12/05/15 112/71  06/28/15 155/81   Pulse Readings from Last 3 Encounters:  01/02/16 93  12/05/15 91  06/28/15 84      Full medical history updated and reviewed in the office today  Patient Active Problem List   Diagnosis Date Noted  . Chronic kidney disease- stage 3a 01/02/2016  . Controlled type 2 diabetes mellitus with chronic kidney disease on chronic dialysis, without long-term current use of insulin ([place]) 01/02/2016  . Diabetes mellitus type II, controlled ([place]) 12/05/2015  . HLD (hyperlipidemia) 12/05/2015  . GERD (gastroesophageal reflux disease) 12/05/2015  . 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 Vitamin D insufficiency 12/05/2015  . Vitamin B deficiency 12/05/2015  . Recovering alcoholic in remission ([place]) 12/05/2015  . History of tobacco abuse 12/05/2015  . Obese 12/05/2015    Past Medical History:  Diagnosis Date  . Diabetes mellitus 12/05/2015   10 yrs- well controlled, no sequelae  . Diabetes mellitus without complication ([place])   . GERD (gastroesophageal reflux disease) 12/05/2015   Well controlled on meds  . HLD (hyperlipidemia) 12/05/2015   10+ yrs at least.   . Hyperlipidemia   . 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 Vitamin B deficiency 12/05/2015  . Vitamin D insufficiency 12/05/2015    Past Surgical History:  Procedure Laterality Date  . JOINT REPLACEMENT Bilateral    hips    Social History  Substance Use Topics  . Smoking status: Former Smoker    Quit date: 05/11/1993  . Smokeless tobacco: Never Used  . Alcohol use No    family history includes Alcohol abuse in his father and mother; Congestive Heart Failure in his father; Kidney disease in his  father.   Medications: Current Outpatient Prescriptions  Medication Sig Dispense Refill  . aspirin EC 81 MG tablet Take 1 tablet by mouth daily.    . B Complex Vitamins (VITAMIN-B COMPLEX) TABS Take 1 tablet by mouth daily.    . Cholecalciferol (VITAMIN D) 2000 units tablet Take 1 tablet by mouth daily.    Marland Kitchen lisinopril (PRINIVIL,ZESTRIL) 2.5 MG tablet Take 2.5 mg by mouth daily.    Marland Kitchen lovastatin (MEVACOR) 40 MG tablet Take 40 mg by mouth daily.    . Melatonin 1 MG TABS Take 3 tablets by mouth at bedtime.    . metFORMIN (GLUCOPHAGE) 500 MG tablet Take 500 mg by mouth daily.    . Omega-3 Fatty Acids (FISH OIL) 1000 MG CAPS Take 1 capsule by mouth daily.    Marland Kitchen omeprazole (PRILOSEC) 20 MG capsule Take 20 mg by mouth daily.    Marland Kitchen lovastatin (MEVACOR) 40 MG tablet Take 40 mg by mouth daily.  No current facility-administered medications for this visit.     Allergies:  No Known Allergies   ROS:  Const:    no fevers, chills Eyes:    conjunctiva clear, no vision changes or blurred vision ENT:  no hearing difficulties, no dysphagia, no dysphonia, no nose bleeds CV:   no chest pain, arrhythmias, no orthopnea, no PND Pulm:   no SOB at rest or exertion, no Wheeze, no DIB, no hemoptysis GI:    no N/V/D/C, no abd pain GU:   no blood in urine or inc freq or urgency Heme/Onc:    no unexplained bleeding, no night sweats, no more fatigue than usual Neuro:   No dizziness, no LOC, No unexplained weakness or numbness Endo:   no unexplained wt loss or gain M-Sk:   no localized myalgias or arthralgias Psych:    No SI/HI, no memory prob or unexplained confusion    Objective:  Blood pressure 109/71, pulse 93, height _0  (1.727 m), weight 200 lb 14.4 oz (91.1 kg). Body mass index is 30.55 kg/m.  Gen: Well NAD, A and O *3 HEENT: [place]/AT, EOMI,  MMM, OP- clr Lungs: Normal work of breathing. CTA B/L, no Wh, rhonchi Heart: RRR, S1, S2 WNL's, no MRG Abd: Soft. No gross distention Exts: warm, pink,   Brisk capillary refill, warm and well perfused.    Recent Results (from the past 2160 hour(s))  POCT glycosylated hemoglobin (Hb A1C)     Status: Abnormal   Collection Time: 12/05/15  2:19 PM  Result Value Ref Range   Hemoglobin A1C 6.0   CBC with Differential/Platelet     Status: None   Collection Time: 12/26/15  8:08 AM  Result Value Ref Range   WBC 6.4 3.8 - 10.8 K/uL   RBC 5.48 4.20 - 5.80 MIL/uL   Hemoglobin 14.9 13.2 - 17.1 g/dL   HCT 45.4 38.5 - 50.0 %   MCV 82.8 80.0 - 100.0 fL   MCH 27.2 27.0 - 33.0 pg   MCHC 32.8 32.0 - 36.0 g/dL   RDW 14.5 11.0 - 15.0 %   Platelets 270 140 - 400 K/uL   MPV 9.2 7.5 - 12.5 fL   Neutro Abs 2,944 1,500 - 7,800 cells/uL   Lymphs Abs 2,496 850 - 3,900 cells/uL   Monocytes Absolute 704 200 - 950 cells/uL   Eosinophils Absolute 192 15 - 500 cells/uL   Basophils Absolute 64 0 - 200 cells/uL   Neutrophils Relative % 46 %   Lymphocytes Relative 39 %   Monocytes Relative 11 %   Eosinophils Relative 3 %   Basophils Relative 1 %   Smear Review Criteria for review not met   COMPLETE METABOLIC PANEL WITH GFR     Status: Abnormal   Collection Time: 12/26/15  8:08 AM  Result Value Ref Range   Sodium 137 135 - 146 mmol/L   Potassium 4.4 3.5 - 5.3 mmol/L   Chloride 105 98 - 110 mmol/L   CO2 24 20 - 31 mmol/L   Glucose, Bld 108 (H) 65 - 99 mg/dL   BUN 16 7 - 25 mg/dL   Creat 1.27 (H) 0.70 - 1.25 mg/dL    Comment:   For patients > or = 69 years of age: The upper reference limit for Creatinine is approximately 13% higher for people identified as African-American.      Total Bilirubin 0.5 0.2 - 1.2 mg/dL   Alkaline Phosphatase 47 40 - 115 U/L   AST 18  10 - 35 U/L   ALT 15 9 - 46 U/L   Total Protein 6.7 6.1 - 8.1 g/dL   Albumin 3.8 3.6 - 5.1 g/dL   Calcium 9.2 8.6 - 10.3 mg/dL   GFR, Est African American 66 >=60 mL/min   GFR, Est Non African American 57 (L) >=60 mL/min  Lipid panel     Status: None   Collection Time: 12/26/15  8:08 AM   Result Value Ref Range   Cholesterol 138 125 - 200 mg/dL   Triglycerides 123 <150 mg/dL   HDL 51 >=40 mg/dL   Total CHOL/HDL Ratio 2.7 <=5.0 Ratio   VLDL 25 <30 mg/dL   LDL Cholesterol 62 <130 mg/dL    Comment:   Total Cholesterol/HDL Ratio:CHD Risk                        Coronary Heart Disease Risk Table                                        Men       Women          1/2 Average Risk              3.4        3.3              Average Risk              5.0        4.4           2X Average Risk              9.6        7.1           3X Average Risk             23.4       11.0 Use the calculated Patient Ratio above and the CHD Risk table  to determine the patient's CHD Risk.   Microalbumin / creatinine urine ratio     Status: None   Collection Time: 12/26/15  8:08 AM  Result Value Ref Range   Creatinine, Urine 88 20 - 370 mg/dL   Microalb, Ur <0.2 Not estab mg/dL   Microalb Creat Ratio SEE NOTE <30 mcg/mg creat    Comment:   The microalbumin value is less than 0.2 mg/dL therefore we are unable to calculate excretion and/or creatinine ratio.   The ADA has defined abnormalities in albumin excretion as follows:           Category           Result                            (mcg/mg creatinine)                 Normal:    <30       Microalbuminuria:    30 - 299   Clinical albuminuria:    > or = 300   The ADA recommends that at least two of three specimens collected within a 3 - 6 month period be abnormal before considering a patient to be within a diagnostic category.     TSH     Status: None   Collection Time: 12/26/15  8:08 AM  Result Value Ref Range   TSH 1.29 0.40 - 4.50 mIU/L  VITAMIN D 25 Hydroxy (Vit-D Deficiency, Fractures)     Status: None   Collection Time: 12/26/15  8:08 AM  Result Value Ref Range   Vit D, 25-Hydroxy 42 30 - 100 ng/mL    Comment: Vitamin D Status           25-OH Vitamin D        Deficiency                <20 ng/mL        Insufficiency         20 -  29 ng/mL        Optimal             > or = 30 ng/mL   For 25-OH Vitamin D testing on patients on D2-supplementation and patients for whom quantitation of D2 and D3 fractions is required, the QuestAssureD 25-OH VIT D, (D2,D3), LC/MS/MS is recommended: order code [id number] (patients > 2 yrs).   Vitamin B12     Status: None   Collection Time: 12/26/15  8:08 AM  Result Value Ref Range   Vitamin B-12 400 200 - 1,100 pg/mL

## 2016-01-02 NOTE — Assessment & Plan Note (Signed)
On ACE inhibitor.

## 2016-01-02 NOTE — Assessment & Plan Note (Signed)
10 yrs- well controlled, no sequelae  Encouraged to monitor sugars.

## 2016-02-26 ENCOUNTER — Telehealth: Payer: Self-pay | Admitting: Family Medicine

## 2016-02-26 DIAGNOSIS — E0800 Diabetes mellitus due to underlying condition with hyperosmolarity without nonketotic hyperglycemic-hyperosmolar coma (NKHHC): Secondary | ICD-10-CM

## 2016-02-26 DIAGNOSIS — K219 Gastro-esophageal reflux disease without esophagitis: Secondary | ICD-10-CM

## 2016-02-26 MED ORDER — OMEPRAZOLE 20 MG PO CPDR: 20.0000 mg | DELAYED_RELEASE_CAPSULE | Freq: Every day | ORAL | 0 refills | Status: DC

## 2016-02-26 MED ORDER — LOVASTATIN 40 MG PO TABS: 40.0000 mg | ORAL_TABLET | Freq: Every day | ORAL | 0 refills | Status: DC

## 2016-02-26 MED ORDER — METFORMIN HCL 500 MG PO TABS: 500.0000 mg | ORAL_TABLET | Freq: Every day | ORAL | 0 refills | Status: DC

## 2016-02-26 MED ORDER — LISINOPRIL 2.5 MG PO TABS
2.5000 mg | ORAL_TABLET | Freq: Every day | ORAL | 0 refills | Status: DC
Start: 2016-02-26 — End: 2016-05-25

## 2016-02-26 NOTE — Addendum Note (Signed)
Addended by: Fonnie Mu on: 02/26/2016 02:43 PM   Modules accepted: Orders

## 2016-02-26 NOTE — Telephone Encounter (Signed)
Patient has an upcoming appt but states he will be out of these meds before then and would like a refill: metformin, lovastatin omeprazole and lisinopril

## 2016-03-02 ENCOUNTER — Ambulatory Visit (INDEPENDENT_AMBULATORY_CARE_PROVIDER_SITE_OTHER): Payer: Medicare Other | Admitting: Family Medicine

## 2016-03-02 ENCOUNTER — Encounter: Payer: Self-pay | Admitting: Family Medicine

## 2016-03-02 VITALS — BP 103/66 | HR 100 | Wt 195.7 lb

## 2016-03-02 DIAGNOSIS — E782 Mixed hyperlipidemia: Secondary | ICD-10-CM | POA: Diagnosis not present

## 2016-03-02 DIAGNOSIS — Z23 Encounter for immunization: Secondary | ICD-10-CM

## 2016-03-02 DIAGNOSIS — E663 Overweight: Secondary | ICD-10-CM

## 2016-03-02 DIAGNOSIS — N186 End stage renal disease: Secondary | ICD-10-CM

## 2016-03-02 DIAGNOSIS — E559 Vitamin D deficiency, unspecified: Secondary | ICD-10-CM

## 2016-03-02 DIAGNOSIS — N183 Chronic kidney disease, stage 3 unspecified: Secondary | ICD-10-CM

## 2016-03-02 DIAGNOSIS — E1122 Type 2 diabetes mellitus with diabetic chronic kidney disease: Secondary | ICD-10-CM | POA: Diagnosis not present

## 2016-03-02 DIAGNOSIS — Z992 Dependence on renal dialysis: Secondary | ICD-10-CM | POA: Diagnosis not present

## 2016-03-02 LAB — POCT GLYCOSYLATED HEMOGLOBIN (HGB A1C): HEMOGLOBIN A1C: 6.2

## 2016-03-02 NOTE — Patient Instructions (Addendum)
Has OD- at [facility]--> she is referrring pt to a Doc for cataracts sx eval---> told pt to get DM retinopathy exam- which you need yrly for diabetics  Please find out what your insurance company when you are due for your yearly physical. Please schedule that with me separately from when you come in for regular chronic disease management  Also please make sure you call GI and schedule your repeat colonoscopy.    Blood Glucose Monitoring, Adult Monitoring your blood glucose (also know as blood sugar) helps you to manage your diabetes. It also helps you and your health care provider monitor your diabetes and determine how well your treatment plan is working. WHY SHOULD YOU MONITOR YOUR BLOOD GLUCOSE?  It can help you understand how food, exercise, and medicine affect your blood glucose.  It allows you to know what your blood glucose is at any given moment. You can quickly tell if you are having low blood glucose (hypoglycemia) or high blood glucose (hyperglycemia).  It can help you and your health care provider know how to adjust your medicines.  It can help you understand how to manage an illness or adjust medicine for exercise. WHEN SHOULD YOU TEST? Your health care provider will help you decide how often you should check your blood glucose. This may depend on the type of diabetes you have, your diabetes control, or the types of medicines you are taking. Be sure to write down all of your blood glucose readings so that this information can be reviewed with your health care provider. See below for examples of testing times that your health care provider may suggest. Type 1 Diabetes  Test at least 2 times per day if your diabetes is well controlled, if you are using an insulin pump, or if you perform multiple daily injections.  If your diabetes is not well controlled or if you are sick, you may need to test more often.  It is a good idea to also test:  Before every insulin injection.  Before  and after exercise.  Between meals and 2 hours after a meal.  Occasionally between 2:00 a.m. and 3:00 a.m. Type 2 Diabetes  If you are taking insulin, test at least 2 times per day. However, it is best to test before every insulin injection.  If you take medicines by mouth (orally), test 2 times a day.  If you are on a controlled diet, test once a day.  If your diabetes is not well controlled or if you are sick, you may need to monitor more often. HOW TO MONITOR YOUR BLOOD GLUCOSE Supplies Needed  Blood glucose meter.  Test strips for your meter. Each meter has its own strips. You must use the strips that go with your own meter.  A pricking needle (lancet).  A device that holds the lancet (lancing device).  A journal or log book to write down your results. Procedure  Wash your hands with soap and water. Alcohol is not preferred.  Prick the side of your finger (not the tip) with the lancet.  Gently milk the finger until a small drop of blood appears.  Follow the instructions that come with your meter for inserting the test strip, applying blood to the strip, and using your blood glucose meter. Other Areas to Get Blood for Testing Some meters allow you to use other areas of your body (other than your finger) to test your blood. These areas are called alternative sites. The most common alternative sites are:  The forearm.  The thigh.  The back area of the lower leg.  The palm of the hand. The blood flow in these areas is slower. Therefore, the blood glucose values you get may be delayed, and the numbers are different from what you would get from your fingers. Do not use alternative sites if you think you are having hypoglycemia. Your reading will not be accurate. Always use a finger if you are having hypoglycemia. Also, if you cannot feel your lows (hypoglycemia unawareness), always use your fingers for your blood glucose checks. ADDITIONAL TIPS FOR GLUCOSE MONITORING  Do  not reuse lancets.  Always carry your supplies with you.  All blood glucose meters have a 24-hour "hotline" number to call if you have questions or need help.  Adjust (calibrate) your blood glucose meter with a control solution after finishing a few boxes of strips. BLOOD GLUCOSE RECORD KEEPING It is a good idea to keep a daily record or log of your blood glucose readings. Most glucose meters, if not all, keep your glucose records stored in the meter. Some meters come with the ability to download your records to your home computer. Keeping a record of your blood glucose readings is especially helpful if you are wanting to look for patterns. Make notes to go along with the blood glucose readings because you might forget what happened at that exact time. Keeping good records helps you and your health care provider to work together to achieve good diabetes management.    This information is not intended to replace advice given to you by your health care provider. Make sure you discuss any questions you have with your health care provider.   Document Released: 04/30/2003 Document Revised: 05/18/2014 Document Reviewed: 09/19/2012 Elsevier Interactive Patient Education [employer].

## 2016-03-02 NOTE — Progress Notes (Signed)
Impression and Recommendations:    1. Controlled type 2 diabetes mellitus with chronic kidney disease on chronic dialysis, without long-term current use of insulin ([place])   2. Mixed hyperlipidemia   3. Vitamin D insufficiency   4. Stage 3 chronic kidney disease   5. Overweight (BMI 25.0-29.9)   6. Need for prophylactic vaccination and inoculation against influenza   7. Need for prophylactic vaccination against Streptococcus pneumoniae (pneumococcus)    -Continue medications,  diabetes stable, well controlled. Patient understands he needs yearly eye exam to rule out diabetic retinopathy. He will make this appointment himself. Urine Microalbumin to creatinine ratio within normal limits. -Continue lovastatin and fish oil. Next -Continue vitamin D supplementation -Continue ACE inhibitor; blood pressure well controlled. Negative proteinuria; stage III CKD -Continue weight loss -- Vaccines updated.  RTC for yearly physicals in addition to chronic disease mgt  New Prescriptions   No medications on file    Modified Medications   No medications on file    Discontinued Medications   No medications on file    The patient was counseled, risk factors were discussed, anticipatory guidance given.  Gross side effects, risk and benefits, and alternatives of medications and treatment plan in general discussed with patient.  Patient is aware that all medications have potential side effects and we are unable to predict every side effect or drug-drug interaction that may occur.   Patient will call with any questions prior to using medication if they have concerns.  Expresses verbal understanding and consents to current therapy and treatment regimen.  No barriers to understanding were identified.  Red flag symptoms and signs discussed in detail.  Patient expressed understanding regarding what to do in case of emergency\urgent symptoms  Return in about 3 months (around 06/02/2016) for Follow-up  of current medical issues.  Please see AVS handed out to patient at the end of our visit for further patient instructions/ counseling done pertaining to today's office visit.    Note: This document was prepared using Dragon voice recognition software and may include unintentional dictation errors.   --------------------------------------------------------------------------------------------------------------------------------------------------------------------------------------------------------------------------------------------  Subjective:    CC:  Chief Complaint  Patient presents with  . Diabetes    HPI: Alan Hurst is a 70 y.o. male who presents to [place] at [facility] today for issues as discussed below.  DM:    A1c 6.2 today.  Not checking at all.  Denies any hyperglycemia or hypoglycemic symptoms. No complaints. No cardio vascular symptoms. No dysesthesias or vision problems.   HTN:    Well controlled, tol meds well. No headache, dizziness, visual changes, palpitations, leg swelling     Chol:    Tolerating meds well. No myalgias or generalized arthralgias that are new with the patient can attribute secondary to medications.  Colonoscopy:   Did the prelim PExam and all w GI- waiting for scheduling  Wt: Patient has lost 5 pounds since last seen over 2 months ago. Encouraged to continue.  Has OD- at [facility]--> she is referrring pt to a Doc for cataracts sx eval---> told pt to get DM retinopathy exam   Clinical care team he needs to be updated-asked patient to get a sheet from [address] on his way out an up-to-date information on it.   Wt Readings from Last 3 Encounters:  03/02/16 195 lb 11.2 oz (88.8 kg)  01/02/16 200 lb 14.4 oz (91.1 kg)  12/05/15 200 lb 12.8 oz (91.1 kg)   BP Readings from  Last 3 Encounters:  03/02/16 103/66  01/02/16 109/71  12/05/15 112/71   Pulse Readings from Last 3 Encounters:  03/02/16 100  01/02/16 93  12/05/15 91    BMI Readings from Last 3 Encounters:  03/02/16 29.76 kg/m  01/02/16 30.55 kg/m  12/05/15 30.53 kg/m     Patient Care Team    Relationship Specialty Notifications Start End  [name], DO PCP - General Family Medicine  11/21/15   Christain [place], [place] Referring Physician Optometry  03/02/16    Comment: [facility]    Patient Active Problem List   Diagnosis Date Noted  . Overweight (BMI 25.0-29.9) 03/02/2016  . Chronic kidney disease- stage 3a 01/02/2016  . Controlled type 2 diabetes mellitus with chronic kidney disease on chronic dialysis, without long-term current use of insulin ([place]) 01/02/2016  . Diabetes mellitus type II, controlled ([place]) 12/05/2015  . HLD (hyperlipidemia) 12/05/2015  . GERD (gastroesophageal reflux disease) 12/05/2015  . 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 Vitamin D insufficiency 12/05/2015  . Vitamin B deficiency 12/05/2015  . Recovering alcoholic in remission ([place]) 12/05/2015  . History of tobacco abuse 12/05/2015    Past Medical history, Surgical history, Family history, Social history, Allergies and Medications have been entered into the medical record, reviewed and changed as needed.   Allergies:  No Known Allergies  Review of Systems  Constitutional: Negative.  Negative for chills, diaphoresis, fever, malaise/fatigue and weight loss.  HENT: Negative.  Negative for congestion, sore throat and tinnitus.   Eyes: Negative.  Negative for blurred vision, double vision and photophobia.  Respiratory: Negative.  Negative for cough and wheezing.   Cardiovascular: Negative.  Negative for chest pain and palpitations.  Gastrointestinal: Negative.  Negative for blood in stool, diarrhea, nausea and vomiting.  Genitourinary: Negative.  Negative for dysuria, frequency and urgency.  Musculoskeletal: Negative.  Negative for joint pain and myalgias.  Skin: Negative.  Negative for itching and rash.  Neurological: Negative.  Negative for  dizziness, focal weakness, weakness and headaches.  Endo/Heme/Allergies: Negative.  Negative for environmental allergies and polydipsia. Does not bruise/bleed easily.  Psychiatric/Behavioral: Negative.  Negative for depression and memory loss. The patient is not nervous/anxious and does not have insomnia.      Objective:   Blood pressure 103/66, pulse 100, weight 195 lb 11.2 oz (88.8 kg). Body mass index is 29.76 kg/m. General: Well Developed, well nourished, appropriate for stated age.  Neuro: Alert and oriented x3, extra-ocular muscles intact, sensation grossly intact.  HEENT: Normocephalic, atraumatic, neck supple   Skin: Warm and dry, no gross rash. Cardiac: RRR, S1 S2,  no murmurs rubs or gallops.  Respiratory: ECTA B/L, Not using accessory muscles, speaking in full sentences-unlabored. Vascular:  No gross lower ext edema, cap RF less 2 sec. Psych: No HI/SI, judgement and insight good, Euthymic mood. Full Affect.

## 2016-04-15 ENCOUNTER — Other Ambulatory Visit: Payer: Self-pay | Admitting: Family Medicine

## 2016-04-15 DIAGNOSIS — E0800 Diabetes mellitus due to underlying condition with hyperosmolarity without nonketotic hyperglycemic-hyperosmolar coma (NKHHC): Secondary | ICD-10-CM

## 2016-04-15 DIAGNOSIS — K219 Gastro-esophageal reflux disease without esophagitis: Secondary | ICD-10-CM

## 2016-04-24 ENCOUNTER — Encounter: Payer: Self-pay | Admitting: Family Medicine

## 2016-04-24 ENCOUNTER — Ambulatory Visit (INDEPENDENT_AMBULATORY_CARE_PROVIDER_SITE_OTHER): Payer: Medicare Other | Admitting: Family Medicine

## 2016-04-24 VITALS — BP 114/68 | HR 87 | Ht 68.0 in | Wt 198.9 lb

## 2016-04-24 DIAGNOSIS — N183 Chronic kidney disease, stage 3 unspecified: Secondary | ICD-10-CM

## 2016-04-24 DIAGNOSIS — M722 Plantar fascial fibromatosis: Secondary | ICD-10-CM | POA: Diagnosis not present

## 2016-04-24 DIAGNOSIS — E1122 Type 2 diabetes mellitus with diabetic chronic kidney disease: Secondary | ICD-10-CM | POA: Diagnosis not present

## 2016-04-24 NOTE — Progress Notes (Signed)
Impression and Recommendations:    1. Controlled type 2 diabetes mellitus with chronic kidney disease, without long-term current use of insulin, unspecified CKD stage (HCC)   2. Stage 3 chronic kidney disease      Diabetes mellitus type II, controlled ([place]) Lab Results  Component Value Date   HGBA1C 6.2 03/02/2016   HGBA1C 6.0 12/05/2015   - Counseling done and diabetic foot exam WNL's today.   - Counseled patient on pathophysiology of disease and discussed various treatment options, which often includes dietary and lifestyle modifications as first line.  Importance of low carb/ketogenic diet discussed with patient in addition to regular exercise.   - Handouts given  - Check FBS and 2 hours after the biggest meal of your day.  Keep log and bring in next OV for my review.   Also, if you ever feel poorly, please check your blood pressure and blood sugar, as one or the other could be the cause of your symptoms.  - Being a diabetic, you need yearly eye and foot exams. Make appt.for diabetic eye exam  - Cont Metformin.  Chronic kidney disease- stage 3a - Cont Ace inh  - bp good control w/o Sx  - water intake, prudent diet and exercise d/c pt    No orders of the defined types were placed in this encounter.    New Prescriptions   No medications on file    Modified Medications   No medications on file    Discontinued Medications   No medications on file    The patient was counseled, risk factors were discussed, anticipatory guidance given.  Gross side effects, risk and benefits, and alternatives of medications and treatment plan in general discussed with patient.  Patient is aware that all medications have potential side effects and we are unable to predict every side effect or drug-drug interaction that may occur.   Patient will call with any questions prior to using medication if they have concerns.  Expresses verbal understanding and consents to current  therapy and treatment regimen.  No barriers to understanding were identified.  Red flag symptoms and signs discussed in detail.  Patient expressed understanding regarding what to do in case of emergency\urgent symptoms  [facility] with [facility] in .  Please see AVS handed out to patient at the end of our visit for further patient instructions/ counseling done pertaining to today's office visit.    Note: This document was prepared using Dragon voice recognition software and may include unintentional dictation errors.   --------------------------------------------------------------------------------------------------------------------------------------------------------------------------------------------------------------------------------------------    Subjective:    CC:  Chief Complaint  Patient presents with  . Foot Pain    HPI: Alan Hurst is a 70 y.o. male who presents to [place] at [facility] today for issues as discussed below.   3 wks ago awoke with sharp pain in L heel with wt bearing.  First steps of AM or after rest -hurt the W.   This started after [place] in [place] and walking around a lot more than usual.  Progressively less painful over next wk or so.   Then saw DM commercial on tv and got worried might be problems with that.    DM:  FBS- running-> NOT checking at all.     Wt Readings from Last 3 Encounters:  04/24/16 198 lb 14.4 oz (90.2 kg)  03/02/16 195 lb 11.2 oz (88.8 kg)  01/02/16 200 lb 14.4 oz (91.1 kg)   BP Readings from Last 3 Encounters:  04/24/16 114/68  03/02/16 103/66  01/02/16 109/71   Pulse Readings from Last 3 Encounters:  04/24/16 87  03/02/16 100  01/02/16 93   BMI Readings from Last 3 Encounters:  04/24/16 30.24 kg/m  03/02/16 29.76 kg/m  01/02/16 30.55 kg/m     Patient Care Team    Relationship Specialty Notifications Start End  [name], DO PCP -  General Family Medicine  11/21/15   Christain [place], [place] Referring Physician Optometry  03/02/16    Comment: [facility]    Patient Active Problem List   Diagnosis Date Noted  . Chronic kidney disease- stage 3a 01/02/2016    Priority: High  . Diabetes mellitus type II, controlled ([place]) 12/05/2015    Priority: High  . HLD (hyperlipidemia) 12/05/2015    Priority: High  . Overweight (BMI 25.0-29.9) 03/02/2016    Priority: Medium  . Recovering alcoholic in remission ([place]) 12/05/2015    Priority: Medium  . History of tobacco abuse 12/05/2015    Priority: Medium  . GERD (gastroesophageal reflux disease) 12/05/2015    Priority: Low  . Sleep disorder 12/05/2015    Priority: Low  . Vitamin D insufficiency 12/05/2015    Priority: Low  . Vitamin B deficiency 12/05/2015    Priority: Low  . 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   Allergies:  No Known Allergies  Review of Systems  Constitutional: Negative for chills and fever.  Musculoskeletal:       Left heel pain x 3 weeks ago, then resolved.  Pt is concerned because of having diabetes.     Objective:   Blood pressure 114/68, pulse 87, height '5\' 8"'$  (1.727 m), weight 198 lb 14.4 oz (90.2 kg). Body mass index is 30.24 kg/m. General: Well Developed, well nourished, appropriate for stated age.  Neuro: Alert and oriented x3, extra-ocular muscles intact, sensation grossly intact.  HEENT: Normocephalic, atraumatic, neck supple, no carotid bruits appreciated  Skin: no gross rash. Cardiac: RRR, S1 S2 Respiratory: ECTA B/L, Not using accessory muscles, speaking in full sentences-unlabored. Vascular:  Ext warm, dry, pink; cap RF less 2 sec. Psych: No HI/SI, judgement and insight good, Euthymic mood. Full Affect. Ext: ttp over heel, otherwise nl exam

## 2016-04-24 NOTE — Assessment & Plan Note (Addendum)
Lab Results  Component Value Date   HGBA1C 6.2 03/02/2016   HGBA1C 6.0 12/05/2015   - Counseling done and diabetic foot exam WNL's today.   - Counseled patient on pathophysiology of disease and discussed various treatment options, which often includes dietary and lifestyle modifications as first line.  Importance of low carb/ketogenic diet discussed with patient in addition to regular exercise.   - Handouts given  - Check FBS and 2 hours after the biggest meal of your day.  Keep log and bring in next OV for my review.   Also, if you ever feel poorly, please check your blood pressure and blood sugar, as one or the other could be the cause of your symptoms.  - Being a diabetic, you need yearly eye and foot exams. Make appt.for diabetic eye exam  - Cont Metformin.

## 2016-04-24 NOTE — Assessment & Plan Note (Addendum)
-   Cont Ace inh  - bp good control w/o Sx  - water intake, prudent diet and exercise d/c pt

## 2016-04-24 NOTE — Assessment & Plan Note (Signed)
>>  ASSESSMENT AND PLAN FOR TYPE 2 DIABETES MELLITUS WITHOUT COMPLICATION, WITHOUT LONG-TERM CURRENT USE OF INSULIN ([place]) WRITTEN ON 04/24/2016 11:59 PM BY OPALSKI, DEBORAH, DO  Lab Results  Component Value Date   HGBA1C 6.2 03/02/2016   HGBA1C 6.0 12/05/2015   - Counseling done and diabetic foot exam WNL's today.   - Counseled patient on pathophysiology of disease and discussed various treatment options, which often includes dietary and lifestyle modifications as first line.  Importance of low carb/ketogenic diet discussed with patient in addition to regular exercise.   - Handouts given  - Check FBS and 2 hours after the biggest meal of your day.  Keep log and bring in next OV for my review.   Also, if you ever feel poorly, please check your blood pressure and blood sugar, as one or the other could be the cause of your symptoms.  - Being a diabetic, you need yearly eye and foot exams. Make appt.for diabetic eye exam  - Cont Metformin.

## 2016-04-24 NOTE — Patient Instructions (Signed)
4 your type 2 diabetes you can check fasting blood sugar and 2 hours after largest meal if you wish. Numbers are well controlled blood sugar perfect is under 100 but a well-controlled diabetic should be less than 120. The 2 hour after largest meal should be less than 180-170.  Walking- 10 min daily

## 2016-05-18 ENCOUNTER — Ambulatory Visit: Payer: Medicare Other | Admitting: Family Medicine

## 2016-05-22 ENCOUNTER — Other Ambulatory Visit: Payer: Self-pay

## 2016-05-22 DIAGNOSIS — E1122 Type 2 diabetes mellitus with diabetic chronic kidney disease: Secondary | ICD-10-CM

## 2016-05-22 DIAGNOSIS — E559 Vitamin D deficiency, unspecified: Secondary | ICD-10-CM

## 2016-05-22 DIAGNOSIS — E539 Vitamin B deficiency, unspecified: Secondary | ICD-10-CM

## 2016-05-22 DIAGNOSIS — E782 Mixed hyperlipidemia: Secondary | ICD-10-CM

## 2016-05-25 ENCOUNTER — Other Ambulatory Visit: Payer: Medicare Other

## 2016-05-25 ENCOUNTER — Other Ambulatory Visit: Payer: Self-pay | Admitting: Family Medicine

## 2016-05-25 DIAGNOSIS — E0800 Diabetes mellitus due to underlying condition with hyperosmolarity without nonketotic hyperglycemic-hyperosmolar coma (NKHHC): Secondary | ICD-10-CM

## 2016-05-25 DIAGNOSIS — K219 Gastro-esophageal reflux disease without esophagitis: Secondary | ICD-10-CM

## 2016-05-26 ENCOUNTER — Other Ambulatory Visit (INDEPENDENT_AMBULATORY_CARE_PROVIDER_SITE_OTHER): Payer: Medicare Other

## 2016-05-26 DIAGNOSIS — E539 Vitamin B deficiency, unspecified: Secondary | ICD-10-CM

## 2016-05-26 DIAGNOSIS — E559 Vitamin D deficiency, unspecified: Secondary | ICD-10-CM | POA: Diagnosis not present

## 2016-05-26 DIAGNOSIS — E1122 Type 2 diabetes mellitus with diabetic chronic kidney disease: Secondary | ICD-10-CM | POA: Diagnosis not present

## 2016-05-26 DIAGNOSIS — E782 Mixed hyperlipidemia: Secondary | ICD-10-CM

## 2016-05-26 LAB — POCT GLYCOSYLATED HEMOGLOBIN (HGB A1C): Hemoglobin A1C: 5.9

## 2016-05-27 LAB — CBC WITH DIFFERENTIAL/PLATELET
BASOS ABS: 0.1 10*3/uL (ref 0.0–0.2)
Basos: 1 %
EOS (ABSOLUTE): 0.2 10*3/uL (ref 0.0–0.4)
Eos: 3 %
HEMOGLOBIN: 14.8 g/dL (ref 13.0–17.7)
Hematocrit: 43.9 % (ref 37.5–51.0)
IMMATURE GRANS (ABS): 0 10*3/uL (ref 0.0–0.1)
Immature Granulocytes: 0 %
LYMPHS: 34 %
Lymphocytes Absolute: 2 10*3/uL (ref 0.7–3.1)
MCH: 27.8 pg (ref 26.6–33.0)
MCHC: 33.7 g/dL (ref 31.5–35.7)
MCV: 83 fL (ref 79–97)
MONOCYTES: 11 %
Monocytes Absolute: 0.7 10*3/uL (ref 0.1–0.9)
NEUTROS ABS: 3 10*3/uL (ref 1.4–7.0)
Neutrophils: 51 %
PLATELETS: 277 10*3/uL (ref 150–379)
RBC: 5.32 x10E6/uL (ref 4.14–5.80)
RDW: 14.2 % (ref 12.3–15.4)
WBC: 5.9 10*3/uL (ref 3.4–10.8)

## 2016-05-27 LAB — COMPREHENSIVE METABOLIC PANEL
ALBUMIN: 4.1 g/dL (ref 3.5–4.8)
ALK PHOS: 57 IU/L (ref 39–117)
ALT: 16 IU/L (ref 0–44)
AST: 20 IU/L (ref 0–40)
Albumin/Globulin Ratio: 1.6 (ref 1.2–2.2)
BILIRUBIN TOTAL: 0.4 mg/dL (ref 0.0–1.2)
BUN / CREAT RATIO: 12 (ref 10–24)
BUN: 14 mg/dL (ref 8–27)
CHLORIDE: 101 mmol/L (ref 96–106)
CO2: 24 mmol/L (ref 18–29)
CREATININE: 1.2 mg/dL (ref 0.76–1.27)
Calcium: 9.4 mg/dL (ref 8.6–10.2)
GFR calc Af Amer: 70 mL/min/{1.73_m2} (ref >59)
GFR calc non Af Amer: 61 mL/min/{1.73_m2} (ref >59)
GLUCOSE: 106 mg/dL — AB (ref 65–99)
Globulin, Total: 2.6 g/dL (ref 1.5–4.5)
Potassium: 4.5 mmol/L (ref 3.5–5.2)
Sodium: 138 mmol/L (ref 134–144)
TOTAL PROTEIN: 6.7 g/dL (ref 6.0–8.5)

## 2016-05-27 LAB — LIPID PANEL
CHOLESTEROL TOTAL: 160 mg/dL (ref 100–199)
Chol/HDL Ratio: 3.1 ratio units (ref 0.0–5.0)
HDL: 51 mg/dL (ref >39)
LDL CALC: 81 mg/dL (ref 0–99)
Triglycerides: 140 mg/dL (ref 0–149)
VLDL CHOLESTEROL CAL: 28 mg/dL (ref 5–40)

## 2016-05-27 LAB — VITAMIN B12: Vitamin B-12: 703 pg/mL (ref 232–1245)

## 2016-05-27 LAB — TSH: TSH: 1.37 u[IU]/mL (ref 0.450–4.500)

## 2016-05-27 LAB — VITAMIN D 25 HYDROXY (VIT D DEFICIENCY, FRACTURES): Vit D, 25-Hydroxy: 47.6 ng/mL (ref 30.0–100.0)

## 2016-06-03 ENCOUNTER — Telehealth: Payer: Self-pay | Admitting: Family Medicine

## 2016-06-03 NOTE — Telephone Encounter (Signed)
Patient called back for lab results after missing your earlier attempt, he said he will be available this afternoon

## 2016-06-03 NOTE — Telephone Encounter (Signed)
See documentation on results note.  Charyl Bigger, CMA

## 2016-07-13 ENCOUNTER — Other Ambulatory Visit: Payer: Self-pay | Admitting: Family Medicine

## 2016-07-13 DIAGNOSIS — E0800 Diabetes mellitus due to underlying condition with hyperosmolarity without nonketotic hyperglycemic-hyperosmolar coma (NKHHC): Secondary | ICD-10-CM

## 2016-07-13 DIAGNOSIS — K219 Gastro-esophageal reflux disease without esophagitis: Secondary | ICD-10-CM

## 2016-08-04 ENCOUNTER — Emergency Department (HOSPITAL_COMMUNITY): Payer: Medicare Other

## 2016-08-04 ENCOUNTER — Encounter (HOSPITAL_COMMUNITY): Payer: Self-pay | Admitting: Emergency Medicine

## 2016-08-04 ENCOUNTER — Inpatient Hospital Stay (HOSPITAL_COMMUNITY)
Admission: EM | Admit: 2016-08-04 | Discharge: 2016-08-10 | DRG: 467 | Disposition: A | Discharge status: Home Health | Payer: Medicare Other | Attending: Family Medicine | Admitting: Family Medicine

## 2016-08-04 DIAGNOSIS — M25551 Pain in right hip: Secondary | ICD-10-CM | POA: Diagnosis present

## 2016-08-04 DIAGNOSIS — I10 Essential (primary) hypertension: Secondary | ICD-10-CM | POA: Diagnosis not present

## 2016-08-04 DIAGNOSIS — Z7982 Long term (current) use of aspirin: Secondary | ICD-10-CM

## 2016-08-04 DIAGNOSIS — W010XXA Fall on same level from slipping, tripping and stumbling without subsequent striking against object, initial encounter: Secondary | ICD-10-CM | POA: Diagnosis present

## 2016-08-04 DIAGNOSIS — K219 Gastro-esophageal reflux disease without esophagitis: Secondary | ICD-10-CM | POA: Diagnosis present

## 2016-08-04 DIAGNOSIS — Z7984 Long term (current) use of oral hypoglycemic drugs: Secondary | ICD-10-CM

## 2016-08-04 DIAGNOSIS — Y92000 Kitchen of unspecified non-institutional (private) residence as  the place of occurrence of the external cause: Secondary | ICD-10-CM

## 2016-08-04 DIAGNOSIS — K5900 Constipation, unspecified: Secondary | ICD-10-CM | POA: Diagnosis not present

## 2016-08-04 DIAGNOSIS — N189 Chronic kidney disease, unspecified: Secondary | ICD-10-CM | POA: Diagnosis present

## 2016-08-04 DIAGNOSIS — Z09 Encounter for follow-up examination after completed treatment for conditions other than malignant neoplasm: Secondary | ICD-10-CM

## 2016-08-04 DIAGNOSIS — Z79899 Other long term (current) drug therapy: Secondary | ICD-10-CM | POA: Diagnosis not present

## 2016-08-04 DIAGNOSIS — E1122 Type 2 diabetes mellitus with diabetic chronic kidney disease: Secondary | ICD-10-CM | POA: Diagnosis present

## 2016-08-04 DIAGNOSIS — E119 Type 2 diabetes mellitus without complications: Secondary | ICD-10-CM | POA: Diagnosis not present

## 2016-08-04 DIAGNOSIS — W19XXXA Unspecified fall, initial encounter: Secondary | ICD-10-CM

## 2016-08-04 DIAGNOSIS — I129 Hypertensive chronic kidney disease with stage 1 through stage 4 chronic kidney disease, or unspecified chronic kidney disease: Secondary | ICD-10-CM | POA: Diagnosis present

## 2016-08-04 DIAGNOSIS — S7221XA Displaced subtrochanteric fracture of right femur, initial encounter for closed fracture: Principal | ICD-10-CM | POA: Diagnosis present

## 2016-08-04 DIAGNOSIS — E785 Hyperlipidemia, unspecified: Secondary | ICD-10-CM | POA: Diagnosis present

## 2016-08-04 DIAGNOSIS — S7290XA Unspecified fracture of unspecified femur, initial encounter for closed fracture: Secondary | ICD-10-CM

## 2016-08-04 DIAGNOSIS — E1159 Type 2 diabetes mellitus with other circulatory complications: Secondary | ICD-10-CM

## 2016-08-04 DIAGNOSIS — M978XXA Periprosthetic fracture around other internal prosthetic joint, initial encounter: Secondary | ICD-10-CM

## 2016-08-04 DIAGNOSIS — Z96642 Presence of left artificial hip joint: Secondary | ICD-10-CM | POA: Diagnosis present

## 2016-08-04 DIAGNOSIS — Z87891 Personal history of nicotine dependence: Secondary | ICD-10-CM

## 2016-08-04 DIAGNOSIS — R110 Nausea: Secondary | ICD-10-CM | POA: Diagnosis not present

## 2016-08-04 DIAGNOSIS — Z96649 Presence of unspecified artificial hip joint: Secondary | ICD-10-CM

## 2016-08-04 DIAGNOSIS — M9701XA Periprosthetic fracture around internal prosthetic right hip joint, initial encounter: Secondary | ICD-10-CM | POA: Diagnosis present

## 2016-08-04 DIAGNOSIS — I152 Hypertension secondary to endocrine disorders: Secondary | ICD-10-CM

## 2016-08-04 DIAGNOSIS — Z419 Encounter for procedure for purposes other than remedying health state, unspecified: Secondary | ICD-10-CM

## 2016-08-04 LAB — CBC WITH DIFFERENTIAL/PLATELET
Basophils Absolute: 0 10*3/uL (ref 0.0–0.1)
Basophils Relative: 0 %
Eosinophils Absolute: 0.1 10*3/uL (ref 0.0–0.7)
Eosinophils Relative: 1 %
HCT: 39.3 % (ref 39.0–52.0)
Hemoglobin: 13 g/dL (ref 13.0–17.0)
Lymphocytes Relative: 22 %
Lymphs Abs: 2.2 10*3/uL (ref 0.7–4.0)
MCH: 27.3 pg (ref 26.0–34.0)
MCHC: 33.1 g/dL (ref 30.0–36.0)
MCV: 82.4 fL (ref 78.0–100.0)
Monocytes Absolute: 1 10*3/uL (ref 0.1–1.0)
Monocytes Relative: 11 %
Neutro Abs: 6.4 10*3/uL (ref 1.7–7.7)
Neutrophils Relative %: 66 %
Platelets: 243 10*3/uL (ref 150–400)
RBC: 4.77 MIL/uL (ref 4.22–5.81)
RDW: 14.7 % (ref 11.5–15.5)
WBC: 9.7 10*3/uL (ref 4.0–10.5)

## 2016-08-04 LAB — BASIC METABOLIC PANEL
Anion gap: 9 (ref 5–15)
BUN: 11 mg/dL (ref 6–20)
CO2: 25 mmol/L (ref 22–32)
Calcium: 8.8 mg/dL — ABNORMAL LOW (ref 8.9–10.3)
Chloride: 103 mmol/L (ref 101–111)
Creatinine, Ser: 1.02 mg/dL (ref 0.61–1.24)
GFR calc Af Amer: >60 mL/min (ref >60)
GFR calc non Af Amer: >60 mL/min (ref >60)
Glucose, Bld: 96 mg/dL (ref 65–99)
Potassium: 4 mmol/L (ref 3.5–5.1)
Sodium: 137 mmol/L (ref 135–145)

## 2016-08-04 MED ORDER — CEFAZOLIN SODIUM-DEXTROSE 2-4 GM/100ML-% IV SOLN: 2.0000 g | INTRAVENOUS | Status: AC

## 2016-08-04 MED ORDER — MORPHINE SULFATE (PF) 2 MG/ML IV SOLN
2.0000 mg | INTRAVENOUS | Status: DC | PRN
  Administered 2016-08-07 – 2016-08-08 (×2): 2 mg via INTRAVENOUS
  Filled 2016-08-04 (×2): qty 1

## 2016-08-04 MED ORDER — PRAVASTATIN SODIUM 40 MG PO TABS: 40.0000 mg | ORAL_TABLET | Freq: Every day | ORAL | Status: DC

## 2016-08-04 MED ORDER — POVIDONE-IODINE 10 % EX SWAB: 2.0000 "application " | Freq: Once | CUTANEOUS | Status: DC

## 2016-08-04 MED ORDER — HEPARIN SODIUM (PORCINE) 5000 UNIT/ML IJ SOLN
5000.0000 [IU] | Freq: Once | INTRAMUSCULAR | Status: AC
  Administered 2016-08-05: 5000 [IU] via SUBCUTANEOUS
  Filled 2016-08-04: qty 1

## 2016-08-04 MED ORDER — PANTOPRAZOLE SODIUM 40 MG PO TBEC
40.0000 mg | DELAYED_RELEASE_TABLET | Freq: Every day | ORAL | Status: DC
  Administered 2016-08-06 – 2016-08-10 (×4): 40 mg via ORAL
  Filled 2016-08-04 (×4): qty 1

## 2016-08-04 MED ORDER — SENNOSIDES-DOCUSATE SODIUM 8.6-50 MG PO TABS
1.0000 | ORAL_TABLET | Freq: Every evening | ORAL | Status: DC | PRN
  Administered 2016-08-08: 1 via ORAL
  Filled 2016-08-04: qty 1

## 2016-08-04 MED ORDER — LISINOPRIL 2.5 MG PO TABS
2.5000 mg | ORAL_TABLET | Freq: Every day | ORAL | Status: DC
  Administered 2016-08-05 – 2016-08-10 (×5): 2.5 mg via ORAL
  Filled 2016-08-04 (×5): qty 1

## 2016-08-04 MED ORDER — ACETAMINOPHEN 10 MG/ML IV SOLN
1000.0000 mg | INTRAVENOUS | Status: AC
Start: 2016-08-05 — End: 2016-08-06

## 2016-08-04 MED ORDER — ACETAMINOPHEN 325 MG PO TABS
650.0000 mg | ORAL_TABLET | Freq: Four times a day (QID) | ORAL | Status: DC | PRN
  Administered 2016-08-05 – 2016-08-06 (×7): 650 mg via ORAL
  Filled 2016-08-04 (×7): qty 2

## 2016-08-04 MED ORDER — SODIUM CHLORIDE 0.9 % IV SOLN
INTRAVENOUS | Status: AC
  Administered 2016-08-05 (×2): via INTRAVENOUS

## 2016-08-04 MED ORDER — BISACODYL 5 MG PO TBEC
5.0000 mg | DELAYED_RELEASE_TABLET | Freq: Every day | ORAL | Status: DC | PRN
  Administered 2016-08-08: 5 mg via ORAL
  Filled 2016-08-04: qty 1

## 2016-08-04 MED ORDER — CHLORHEXIDINE GLUCONATE 4 % EX LIQD
60.0000 mL | Freq: Once | CUTANEOUS | Status: AC
  Administered 2016-08-05: 4 via TOPICAL

## 2016-08-04 MED ORDER — TRANEXAMIC ACID 1000 MG/10ML IV SOLN
1000.0000 mg | INTRAVENOUS | Status: AC
  Filled 2016-08-04: qty 10

## 2016-08-04 NOTE — H&P (Signed)
Alan Hurst is an 71 y.o. male.   Chief Complaint: Right hip fx HPI: Alan Hurst was at home last night when he slipped on some water in his kitchen and fell on his right side. He had immediate pain. He was able to get up and hobble to his wife's wheelchair where he spent the night. He came to the ED this morning for evaluation and was found to have a periprosthetic right femur fx.  Past Medical History:  Diagnosis Date  . GERD (gastroesophageal reflux disease) 12/05/2015   Well controlled on meds  . HLD (hyperlipidemia) 12/05/2015   10+ yrs at least.   . 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 Vitamin B deficiency 12/05/2015  . Vitamin D insufficiency 12/05/2015    Past Surgical History:  Procedure Laterality Date  . JOINT REPLACEMENT Bilateral    hips    Family History  Problem Relation Age of Onset  . Alcohol abuse Mother   . Alcohol abuse Father   . Congestive Heart Failure Father   . Kidney disease Father    Social History:  reports that he quit smoking about 23 years ago. He has never used smokeless tobacco. He reports that he does not drink alcohol or use drugs.  Allergies: No Known Allergies   (Not in a hospital admission)  No results found for this or any previous visit (from the past 48 hour(s)). Dg Hip Unilat With Pelvis 2-3 Views Right  Result Date: 08/04/2016 CLINICAL DATA:  [place] last night landing on the right hip with pain and limited mobility EXAM: DG HIP (WITH OR WITHOUT PELVIS) 2-3V RIGHT COMPARISON:  None. FINDINGS: There is an oblique fracture of the proximal left femur several cms below the lesser trochanter. In addition there is a nondisplaced fracture of the lateral proximal right femur near the stem of the femoral component. It is noted that the femoral component head is not centered within the right acetabular cup. This could be due to thinning of the plastic insert over time, with an acute process less likely. The left  hip replacement is unremarkable. The bones are osteopenic. The pelvic rami are intact. The SI joints are corticated. IMPRESSION: 1. Oblique fracture of the proximal left subtrochanteric femur. 2. Transverse fracture of the proximal right femur near the femoral stem laterally. 3. Asymmetrical position of the head of the femoral component relative to the acetabular cup of questionable clinical significance. Electronically Signed   By: Ivar Drape M.D.   On: 08/04/2016 14:55    Review of Systems  Constitutional: Negative for weight loss.  HENT: Negative for ear discharge, ear pain, hearing loss and tinnitus.   Eyes: Negative for blurred vision, double vision, photophobia and pain.  Respiratory: Negative for cough, sputum production and shortness of breath.   Cardiovascular: Negative for chest pain.  Gastrointestinal: Negative for abdominal pain, nausea and vomiting.  Genitourinary: Negative for dysuria, flank pain, frequency and urgency.  Musculoskeletal: Positive for joint pain (Right hip). Negative for back pain, falls, myalgias and neck pain.  Neurological: Negative for dizziness, tingling, sensory change, focal weakness, loss of consciousness and headaches.  Endo/Heme/Allergies: Does not bruise/bleed easily.  Psychiatric/Behavioral: Negative for depression, memory loss and substance abuse. The patient is not nervous/anxious.     Blood pressure 127/74, pulse 77, height '5\' 9"'$  (1.753 m), weight 90.7 kg (200 lb), SpO2 97 %. Physical Exam  Constitutional: He appears well-developed and well-nourished. No distress.  HENT:  Head: Normocephalic  and atraumatic.  Eyes: Conjunctivae are normal. Right eye exhibits no discharge. Left eye exhibits no discharge. No scleral icterus.  Neck: Normal range of motion. Neck supple.  Cardiovascular: Normal rate and regular rhythm.   Respiratory: Effort normal. No respiratory distress.  GI: Soft.  Musculoskeletal:  Bilateral shoulder, elbow, wrist, digits- no  skin wounds, nontender, no instability, no blocks to motion  Sens  Ax/R/M/U intact  Mot   Ax/ R/ PIN/ M/ AIN/ U intact  Rad 2+  Pelvis--no traumatic wounds or rash, no ecchymosis, stable to manual stress, nontender  LLE No traumatic wounds, ecchymosis, or rash  Nontender  No effusions  Knee stable to varus/ valgus and anterior/posterior stress  Sens DPN, SPN, TN intact  Motor EHL, ext, flex, evers 5/5  DP 2+, PT 2+, No significant edema   RLE No traumatic wounds, ecchymosis, or rash             Slight external rotation  TTP hip  No effusions  Knee stable to varus/ valgus and anterior/posterior stress  Sens DPN, SPN, TN intact but some tingling in toes  Motor EHL, ext, flex, evers 5/5  DP 2+, PT 2+, No significant edema     Lymphadenopathy:    He has no cervical adenopathy.  Skin: He is not diaphoretic.     Assessment/Plan Right periprosthetic femur fx -- Will get full-length femur films and CT scan. ? Operative vs non-operative management. Dr. Stann Mainland to f/u with films and decide on definitive management. ?Left troch femur fx -- Given lack of pain to palpation or full weightbearing I suspect this is artifact HTN/Hyperlipidemia/DM -- All well controlled  IM to admit    Lisette Abu, PA-C Orthopedic Surgery [phone] 08/04/2016, 4:17 PM

## 2016-08-04 NOTE — ED Notes (Signed)
Report attempted 

## 2016-08-04 NOTE — Progress Notes (Signed)
I have seen the patient and discussed potential treatment plans.  Due to the complexity of his surgery, I have asked my total joint specialty partner Dr. Lyla Glassing to assume care.  He will need surgical management tomorrow afternoon, pending the proper equipment can be located.  Please keep pt NPO tonight at [place] and hold any chemical DVT ppx in the am.  Full consultation note forthcoming from Dr. Lyla Glassing.   Nicholes Stairs, MD [facility].

## 2016-08-04 NOTE — H&P (Signed)
[facility] Admission History and Physical Service Pager: [phone]  Patient name: Alan Hurst Medical record number: [id number] Date of birth: Sep 20, 1945 Age: 71 y.o. Gender: male  Primary Care Provider: [name], DO Consultants: Ortho Code Status: Full   Chief Complaint: Hip pain s/p fall   Assessment and Plan: Alan Hurst is a 71 y.o. male presenting with hip pain s/p fall. 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   Right Periprosthetic Femur Fracture: S/p mechanical fall. Xray right hip: Transverse fracture of the proximal right femur near the femoral stem laterally. Questionable left trochanter fracture on x-ray but given lack of TTP and ability to weight bear ortho suspected artifact. CT scan right hip and full length femur films ordered by ortho for decision making regarding operative vs. Non-operative management. CT right hip revealed acute, comminuted, closed periprosthetic fracture of the right femur along side the noncemented femoral stem of a total hip arthroplasty. A horizontal oblique fracture component was seen exiting roughly 2.5 cm from the tip of the uncemented femoral stem with minimal medial and anterior displacement of the distal fracture fragment. Patient reports pain fairly tolerable at admission and he is neurovascularly intact. Revised Cardiac Risk Index for Pre-operative risk is 0 with 0.4% chance of major cardiac risk.  -admit to FPTS; vital signs per unit  -ortho consulted, appreciated recs  -given CT right hip findings, Dr. Stann Mainland has asked total joint specialist Dr. Lyla Glassing to assume care; plan for OR for surgical management afternoon of 3/28  -pain control: tylenol 650 mg q6h prn and morphine 2 mg q4h prn  -PT/OT consult  -SQ heparin for DVT prophylaxis tonight; no DVT prophylaxis in AM  -NPO at MN for anticipated surgery  -EKG for pre-operative assessment  -PT/INR    HLD: Last lipid panel Jan 2018: Cholesterol 160, HDL 51, LDL 81, Triglycerides 140. On Lovastatin 40 mg and ASA 81 mg daily at home.  -pravastatin (formulary substitute for home lovastatin)  -ASCVD risk calculated to be >7.5%, patient may benefit from high intensity statin  -hold ASA for surgery   T2DM: Well controlled with recent A1c 5.9 in Jan 2018. On Metformin 500 mg daily.  -CBG daily  -hold metformin  -will order SSI if necessary in setting of stress during hospitalization   CKD: Cr 1.02 at admission. Baseline Cr appears to be 1.2. Likely related to h/o T2DM.  -monitor BMET -avoid nephrotoxic agents   FEN/GI: Carb Modified Diet then NPO at MN, NS 125 cc/hr x12h overnight, Protonix  Prophylaxis: SQ heparin then hold for OR   Disposition: Admit to FPTS; pending surgery   History of Present Illness:  Alan Hurst is a 71 y.o. male presenting with right hip pain after slipping on water on the kitchen floor on the evening of 3/26. Patient reports landing on the right hip and having persistent and immediate pain since his fall. Denies pain elsewhere in his joints. Denies LOC and hitting his head at the time of the fall. Came in this morning for evaluation.   In the ED, imaging revealed periprosthetic fracture of the right femur. Patient reports intact sensation of the right LE but does endorse some tingling in his toes on the right foot. Was able to bear weight on his left side without pain but not right prior to arrival at ED. Ortho was consulted and are planning for surgical management. FPTS was asked to admit for medical  management.   Prior to this fall, patient was quite active. 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 Reports he can lift her from the wheelchair without assistance.   Review Of Systems: Per HPI with the following additions:   Review of Systems  Constitutional: Negative for fever.  Eyes: Negative for blurred vision.  Cardiovascular: Negative for chest  pain and leg swelling.  Gastrointestinal: Negative for abdominal pain, nausea and vomiting.  Neurological: Negative for focal weakness, weakness and headaches.    Patient Active Problem List   Diagnosis Date Noted  . Overweight (BMI 25.0-29.9) 03/02/2016  . Chronic kidney disease- stage 3a 01/02/2016  . Diabetes mellitus type II, controlled ([place]) 12/05/2015  . HLD (hyperlipidemia) 12/05/2015  . GERD (gastroesophageal reflux disease) 12/05/2015  . 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 Vitamin D insufficiency 12/05/2015  . Vitamin B deficiency 12/05/2015  . Recovering alcoholic in remission ([place]) 12/05/2015  . History of tobacco abuse 12/05/2015    Past Medical History: Past Medical History:  Diagnosis Date  . GERD (gastroesophageal reflux disease) 12/05/2015   Well controlled on meds  . HLD (hyperlipidemia) 12/05/2015   10+ yrs at least.   . 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 Vitamin B deficiency 12/05/2015  . Vitamin D insufficiency 12/05/2015    Past Surgical History: Past Surgical History:  Procedure Laterality Date  . JOINT REPLACEMENT Bilateral    hips    Social History: Social History  Substance Use Topics  . Smoking status: Former Smoker    Quit date: 05/11/1993  . Smokeless tobacco: Never Used  . Alcohol use No   Additional social history: Former [employer] use. Has not drank for several years. No drug use.   Please also refer to relevant sections of EMR.  Family History: Family History  Problem Relation Age of Onset  . Alcohol abuse Mother   . Alcohol abuse Father   . Congestive Heart Failure Father   . Kidney disease Father     Allergies and Medications: No Known Allergies No current facility-administered medications on file prior to encounter.    Current Outpatient Prescriptions on File Prior to Encounter  Medication Sig Dispense Refill  . aspirin EC 81 MG tablet Take 1 tablet by  mouth daily.    . B Complex Vitamins (VITAMIN-B COMPLEX) TABS Take 1 tablet by mouth daily.    . Cholecalciferol (VITAMIN D) 2000 units tablet Take 1 tablet by mouth daily.    Marland Kitchen lisinopril (PRINIVIL,ZESTRIL) 2.5 MG tablet TAKE 1 TABLET BY MOUTH  DAILY 90 tablet 0  . lovastatin (MEVACOR) 40 MG tablet TAKE 1 TABLET BY MOUTH  DAILY 90 tablet 0  . magnesium oxide (MAG-OX) 400 MG tablet Take 400 mg by mouth daily.    . Melatonin 1 MG TABS Take 3 tablets by mouth at bedtime.    . metFORMIN (GLUCOPHAGE) 500 MG tablet TAKE 1 TABLET BY MOUTH  DAILY 90 tablet 0  . Multiple Vitamin (MULTIVITAMIN) tablet Take 1 tablet by mouth daily.    . Omega-3 Fatty Acids (FISH OIL) 1000 MG CAPS Take 1 capsule by mouth daily.    Marland Kitchen omeprazole (PRILOSEC) 20 MG capsule TAKE 1 CAPSULE BY MOUTH  DAILY 90 capsule 0    Objective: BP 127/74   Pulse 77   Ht '5\' 9"'$  (1.753 m)   Wt 200 lb (90.7 kg)   SpO2 97%   BMI 29.53 kg/m  Exam:  General: pleasant male lying in bed, NAD Eyes: EOMI. PERRL. ENTM: MMM. Oropharynx clear.  Neck: Full ROM. Supply.  Cardiovascular: RRR. No murmurs appreciated. 2+ pedal pulses.  Respiratory: CTAB. Normal WOB.  Gastrointestinal: +BS, soft, NTND  MSK: TTP over right hip and proximal femur. No TTP over left hip or femur. Moves extremities spontaneously.  Derm: Warm and dry.  Neuro: Sensation to LE grossly intact. Able to wiggle toes bilaterally. Strength 5/5 in LE bilaterally.  Psych: Appropriate mood and affect.   Labs and Imaging: CBC BMET   Recent Labs Lab 08/04/16 1628  WBC 9.7  HGB 13.0  HCT 39.3  PLT 243    Recent Labs Lab 08/04/16 1628  NA 137  K 4.0  CL 103  CO2 25  BUN 11  CREATININE 1.02  GLUCOSE 96  CALCIUM 8.8*     Ct Hip Right Wo Contrast  Result Date: 08/04/2016 CLINICAL DATA:  Periprostatic fracture of the right hip. EXAM: CT OF THE RIGHT HIP WITHOUT CONTRAST TECHNIQUE: Multidetector CT imaging of the right hip was performed according to the standard  protocol. Multiplanar CT image reconstructions were also generated. COMPARISON:  Same day radiographs of the right hip FINDINGS: Bones/Joint/Cartilage There is an acute, closed periprosthetic fracture of the right femur. A sagittal oblique fracture is noted through the proximal third of the femoral shaft extending from greater trochanter (just lateral to the femoral stem) and converging with a horizontal oblique fracture near the junction of the proximal middle third of the femoral shaft, exiting laterally. Slight medial displacement of the distal fracture fragment by 3 mm with 4 mm of anterior displacement is noted at this level. The horizontal oblique fracture is seen approximately 2.5 cm from the tip of the femoral stem. Small posterior butterfly fragment noted posteriorly just proximal to the horizontal oblique fracture, series 7, image 46. Ligaments Suboptimally assessed by CT. Muscles and Tendons No intramuscular hematoma or acute hemorrhage. Soft tissues No focal fluid collection. Mild eccentric positioning of the femoral head prosthetic within the acetabular component suggests asymmetric lining wear. IMPRESSION: 1. Acute, comminuted, closed periprosthetic fracture of the right femur along side the noncemented femoral stem of a total hip arthroplasty. A horizontal oblique fracture component is seen exiting roughly 2.5 cm from the tip of the uncemented femoral stem with minimal medial and anterior displacement of the distal fracture fragment. The larger sagittal oblique fracture is noted from the level of the greater trochanter just lateral to the femoral component converging with a horizontal oblique fracture distally. 2. Asymmetric positioning of the femoral head within the acetabular component suggest presence of asymmetric lining wear. Electronically Signed   By: [name] M.D.   On: 08/04/2016 19:09   Dg Hip Unilat With Pelvis 2-3 Views Right  Result Date: 08/04/2016 CLINICAL DATA:  [place] last night  landing on the right hip with pain and limited mobility EXAM: DG HIP (WITH OR WITHOUT PELVIS) 2-3V RIGHT COMPARISON:  None. FINDINGS: There is an oblique fracture of the proximal left femur several cms below the lesser trochanter. In addition there is a nondisplaced fracture of the lateral proximal right femur near the stem of the femoral component. It is noted that the femoral component head is not centered within the right acetabular cup. This could be due to thinning of the plastic insert over time, with an acute process less likely. The left hip replacement is unremarkable. The bones are osteopenic. The pelvic rami are intact. The SI joints are corticated. IMPRESSION: 1.  Oblique fracture of the proximal left subtrochanteric femur. 2. Transverse fracture of the proximal right femur near the femoral stem laterally. 3. Asymmetrical position of the head of the femoral component relative to the acetabular cup of questionable clinical significance. Electronically Signed   By: Ivar Drape M.D.   On: 08/04/2016 14:55   Dg Femur, Min 2 Views Right  Result Date: 08/04/2016 CLINICAL DATA:  Patient slipped and fell. Fracture of the right femur. EXAM: RIGHT FEMUR 2 VIEWS COMPARISON:  None. FINDINGS: Acute, comminuted closed fracture of the proximal femoral shaft surrounding an uncemented femoral stem component of the total hip arthroplasty. A horizontal component is seen laterally approximately 2.5 cm from the tip of the femoral stem. An oblique fracture seen more proximally along the medial aspect. No significant displacement. Asymmetric positioning of the femoral head prosthetic within the acetabular component suggests asymmetric lining wear. IMPRESSION: 1. Acute, closed periprosthetic comminuted fracture of the right femur as above. 2. Asymmetric lining wear suggested given the non centered appearance of the femoral head within the acetabular component. Electronically Signed   By: [name] M.D.   On: 08/04/2016  19:13    Nicolette Bang, DO 08/04/2016, 5:31 PM PGY-2, [place] Intern pager: [phone], text pages welcome

## 2016-08-04 NOTE — ED Notes (Signed)
Patient is stable and ready to be transport to the floor at this time.  Report was called to 5N RN.  Belongings taken with the patient to the floor.

## 2016-08-04 NOTE — ED Triage Notes (Signed)
Pt in via [facility] after fall last night, sustaining injury to R hip. Per pt, he slipped on water on his kitchen floor, landed directly on R hip. Denies hitting head, no LOC, doesn't take blood thinners. R leg shortening & rotation noted per EMS assessment. Hx of bilateral hip replacements, good pulses, PMS in RLE

## 2016-08-04 NOTE — ED Notes (Signed)
Gave pt ice water, per Aaron Edelman - RN

## 2016-08-05 DIAGNOSIS — E119 Type 2 diabetes mellitus without complications: Secondary | ICD-10-CM

## 2016-08-05 DIAGNOSIS — M9701XA Periprosthetic fracture around internal prosthetic right hip joint, initial encounter: Secondary | ICD-10-CM

## 2016-08-05 DIAGNOSIS — W19XXXA Unspecified fall, initial encounter: Secondary | ICD-10-CM

## 2016-08-05 DIAGNOSIS — I10 Essential (primary) hypertension: Secondary | ICD-10-CM

## 2016-08-05 LAB — CBC
HCT: 37.7 % — ABNORMAL LOW (ref 39.0–52.0)
Hemoglobin: 12.6 g/dL — ABNORMAL LOW (ref 13.0–17.0)
MCH: 27.3 pg (ref 26.0–34.0)
MCHC: 33.4 g/dL (ref 30.0–36.0)
MCV: 81.8 fL (ref 78.0–100.0)
Platelets: 235 10*3/uL (ref 150–400)
RBC: 4.61 MIL/uL (ref 4.22–5.81)
RDW: 14.6 % (ref 11.5–15.5)
WBC: 7.1 10*3/uL (ref 4.0–10.5)

## 2016-08-05 LAB — BASIC METABOLIC PANEL
Anion gap: 8 (ref 5–15)
BUN: 10 mg/dL (ref 6–20)
CO2: 25 mmol/L (ref 22–32)
Calcium: 8.9 mg/dL (ref 8.9–10.3)
Chloride: 104 mmol/L (ref 101–111)
Creatinine, Ser: 1.07 mg/dL (ref 0.61–1.24)
GFR calc Af Amer: >60 mL/min (ref >60)
GFR calc non Af Amer: >60 mL/min (ref >60)
Glucose, Bld: 150 mg/dL — ABNORMAL HIGH (ref 65–99)
Potassium: 3.8 mmol/L (ref 3.5–5.1)
Sodium: 137 mmol/L (ref 135–145)

## 2016-08-05 LAB — PROTIME-INR
INR: 1.09
Prothrombin Time: 14.1 seconds (ref 11.4–15.2)

## 2016-08-05 LAB — GLUCOSE, CAPILLARY
GLUCOSE-CAPILLARY: 106 mg/dL — AB (ref 65–99)
Glucose-Capillary: 100 mg/dL — ABNORMAL HIGH (ref 65–99)

## 2016-08-05 LAB — SURGICAL PCR SCREEN
MRSA, PCR: NEGATIVE
Staphylococcus aureus: POSITIVE — AB

## 2016-08-05 MED ORDER — ATORVASTATIN CALCIUM 40 MG PO TABS
40.0000 mg | ORAL_TABLET | Freq: Every day | ORAL | Status: DC
  Administered 2016-08-05 – 2016-08-10 (×6): 40 mg via ORAL
  Filled 2016-08-05 (×6): qty 1

## 2016-08-05 MED ORDER — MUPIROCIN 2 % EX OINT
1.0000 "application " | TOPICAL_OINTMENT | Freq: Two times a day (BID) | CUTANEOUS | Status: AC
  Administered 2016-08-05 – 2016-08-09 (×9): 1 via NASAL
  Filled 2016-08-05: qty 22

## 2016-08-05 MED ORDER — CHLORHEXIDINE GLUCONATE CLOTH 2 % EX PADS
6.0000 | MEDICATED_PAD | Freq: Every day | CUTANEOUS | Status: AC
  Administered 2016-08-05 – 2016-08-09 (×4): 6 via TOPICAL

## 2016-08-05 MED ORDER — ENOXAPARIN SODIUM 40 MG/0.4ML ~~LOC~~ SOLN
40.0000 mg | Freq: Once | SUBCUTANEOUS | Status: AC
  Administered 2016-08-05: 40 mg via SUBCUTANEOUS
  Filled 2016-08-05: qty 0.4

## 2016-08-05 NOTE — Progress Notes (Signed)
Family Medicine Teaching Service Daily Progress Note Intern Pager: [phone]  Patient name: CALAN DOREN Medical record number: [id number] Date of birth: August 26, 1945 Age: 71 y.o. Gender: male  Primary Care Provider: [name], DO Consultants: Ortho  Code Status: FULL  Pt Overview and Major Events to Date:  Admit 3/28  Assessment and Plan: Alan Hurst is a 71 y.o. male presenting with hip pain s/p fall. 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   Right Periprosthetic Femur Fracture: S/p mechanical fall. Xray right hip: Transverse fracture of the proximal right femur near the femoral stem laterally. Questionable left trochanter fracture on x-ray but given lack of TTP and ability to weight bear ortho suspected artifact.  CT right hip revealed acute, comminuted, closed periprosthetic fracture of the right femur along side the noncemented femoral stem of a total hip arthroplasty. A horizontal oblique fracture component was seen exiting roughly 2.5 cm from the tip of the uncemented femoral stem with minimal medial and anterior displacement of the distal fracture fragment. Patient reports pain fairly tolerable at admission and he is neurovascularly intact. Revised Cardiac Risk Index for Pre-operative risk is 0 with 0.4% chance of major cardiac risk.  -Ortho consulted, appreciate recs  -given CT right hip findings, Dr. Stann Mainland has asked total joint specialist Dr. Lyla Glassing to assume care; plan for OR for surgical management  (3/30)  -pain control: tylenol 650 mg q6h prn and morphine 2 mg q4h prn .  Patient has not required the morphine.  -PT/OT consult  -SQ heparin for DVT prophylaxis yesterday evening.  SCDs today.   HLD: Last lipid panel Jan 2018: Cholesterol 160, HDL 51, LDL 81, Triglycerides 140. On Lovastatin 40 mg and ASA 81 mg daily at home.   -pravastatin (formulary substitute for home lovastatin)  -ASCVD risk calculated to be >7.5%.   Will d/c Lovastatin and start high intensity statin Lipitor 40 mg daily today.   -hold ASA for surgery   T2DM: Well controlled with recent A1c 5.9 in Jan 2018. On Metformin 500 mg daily.  -CBG daily  -hold metformin  -will order SSI if necessary in setting of stress during hospitalization   CKD: Cr 1.07 this morning. Baseline Cr appears to be 1.2. Likely related to h/o T2DM.  -monitor BMET -avoid nephrotoxic agents   FEN/GI: carb modified,  NS 125 cc/hr x12h overnight, Protonix  Prophylaxis: SQ heparin then hold for OR   Disposition: Dispo pending clinical improvement. Plan for OR on Friday.   Subjective:  Patient appears comfortable in bed.  Has not required any of his morphine and reports pain is bearable.  No concerns at this time.    Objective: Temp:  [98 F (36.7 C)] 98 F (36.7 C) (03/28 1427) Pulse Rate:  [76-87] 80 (03/28 1427) Resp:  [16-18] 16 (03/28 1427) BP: (105-136)/(59-104) 127/67 (03/28 1427) SpO2:  [97 %-99 %] 97 % (03/28 1427)   Physical Exam: General: pleasant male lying in bed, NAD Eyes: EOMI, PERRL ENTM: MMM. o/p clear  Neck: Supple  Cardiovascular: RRR. No MRG noted  Respiratory: CTAB. Normal WOB.  Gastrointestinal: +BS, soft, NTND  MSK: TTP over right hip and proximal femur. No TTP over left hip or femur. Moves extremities spontaneously.  Derm: Skin is warm and dry  Neuro: Sensation to LE grossly intact. Able to wiggle toes bilaterally. Strength 5/5 in LE bilaterally.  Psych: Appropriate mood and affect.   Laboratory:  Recent Labs Lab 08/04/16 [id number]  08/05/16 0237  WBC 9.7 7.1  HGB 13.0 12.6*  HCT 39.3 37.7*  PLT 243 235    Recent Labs Lab 08/04/16 1628 08/05/16 0237  NA 137 137  K 4.0 3.8  CL 103 104  CO2 25 25  BUN 11 10  CREATININE 1.02 1.07  CALCIUM 8.8* 8.9  GLUCOSE 96 150*   Imaging/Diagnostic Tests: Ct Hip Right Wo Contrast  Result Date: 08/04/2016 CLINICAL DATA:  Periprostatic fracture of the right hip. EXAM: CT OF  THE RIGHT HIP WITHOUT CONTRAST TECHNIQUE: Multidetector CT imaging of the right hip was performed according to the standard protocol. Multiplanar CT image reconstructions were also generated. COMPARISON:  Same day radiographs of the right hip FINDINGS: Bones/Joint/Cartilage There is an acute, closed periprosthetic fracture of the right femur. A sagittal oblique fracture is noted through the proximal third of the femoral shaft extending from greater trochanter (just lateral to the femoral stem) and converging with a horizontal oblique fracture near the junction of the proximal middle third of the femoral shaft, exiting laterally. Slight medial displacement of the distal fracture fragment by 3 mm with 4 mm of anterior displacement is noted at this level. The horizontal oblique fracture is seen approximately 2.5 cm from the tip of the femoral stem. Small posterior butterfly fragment noted posteriorly just proximal to the horizontal oblique fracture, series 7, image 46. Ligaments Suboptimally assessed by CT. Muscles and Tendons No intramuscular hematoma or acute hemorrhage. Soft tissues No focal fluid collection. Mild eccentric positioning of the femoral head prosthetic within the acetabular component suggests asymmetric lining wear. IMPRESSION: 1. Acute, comminuted, closed periprosthetic fracture of the right femur along side the noncemented femoral stem of a total hip arthroplasty. A horizontal oblique fracture component is seen exiting roughly 2.5 cm from the tip of the uncemented femoral stem with minimal medial and anterior displacement of the distal fracture fragment. The larger sagittal oblique fracture is noted from the level of the greater trochanter just lateral to the femoral component converging with a horizontal oblique fracture distally. 2. Asymmetric positioning of the femoral head within the acetabular component suggest presence of asymmetric lining wear. Electronically Signed   By: [name] M.D.    On: 08/04/2016 19:09   Dg Femur, Min 2 Views Right  Result Date: 08/04/2016 CLINICAL DATA:  Patient slipped and fell. Fracture of the right femur. EXAM: RIGHT FEMUR 2 VIEWS COMPARISON:  None. FINDINGS: Acute, comminuted closed fracture of the proximal femoral shaft surrounding an uncemented femoral stem component of the total hip arthroplasty. A horizontal component is seen laterally approximately 2.5 cm from the tip of the femoral stem. An oblique fracture seen more proximally along the medial aspect. No significant displacement. Asymmetric positioning of the femoral head prosthetic within the acetabular component suggests asymmetric lining wear. IMPRESSION: 1. Acute, closed periprosthetic comminuted fracture of the right femur as above. 2. Asymmetric lining wear suggested given the non centered appearance of the femoral head within the acetabular component. Electronically Signed   By: [name] M.D.   On: 08/04/2016 19:13    Lovenia Kim, MD 08/05/2016, 4:06 PM PGY-1, [place] Intern pager: [phone], text pages welcome

## 2016-08-05 NOTE — Consult Note (Signed)
ORTHOPAEDIC CONSULTATION  REQUESTING PHYSICIAN: Alan Dawn, MD  PCP:  Alan Dance, DO  Chief Complaint: Right periprosthetic femur fracture  HPI: Alan Hurst is a 71 y.o. male who complains of right hip pain. 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 Amada Jupiter in 1995. He tripped and fell at home on 08/03/2016, and he landed on his right hip. He had difficulty weightbearing. He came to the hospital yesterday, where x-rays and a CT scan revealed a right [place] B2 periprosthetic femur fracture. 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 He was admitted to the hospitalist service for perioperative risk stratification and medical optimization. Due to the complexity of this injury, I was asked to see the patient by Dr. [name].  Past Medical History:  Diagnosis Date  . GERD (gastroesophageal reflux disease) 12/05/2015   Well controlled on meds  . HLD (hyperlipidemia) 12/05/2015   10+ yrs at least.   . 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 Vitamin B deficiency 12/05/2015  . Vitamin D insufficiency 12/05/2015   Past Surgical History:  Procedure Laterality Date  . JOINT REPLACEMENT Bilateral    hips   Social History   Social History  . Marital status: Married    Spouse name: N/A  . Number of children: N/A  . Years of education: N/A   Social History Main Topics  . Smoking status: Former Smoker    Quit date: 05/11/1993  . Smokeless tobacco: Never Used  . Alcohol use No  . Drug use: No  . Sexual activity: Yes    Birth control/ protection: None   Other Topics Concern  . None   Social History Narrative  . None   Family History  Problem Relation Age of Onset  . Alcohol abuse Mother   . Alcohol abuse Father   . Congestive Heart Failure Father   . Kidney disease Father    No Known Allergies Prior to Admission medications   Medication Sig Start Date End  Date Taking? Authorizing Provider  aspirin EC 81 MG tablet Take 1 tablet by mouth daily. 04/25/13  Yes Historical Provider, MD  B Complex Vitamins (VITAMIN-B COMPLEX) TABS Take 1 tablet by mouth daily.   Yes Historical Provider, MD  Cholecalciferol (VITAMIN D) 2000 units tablet Take 1 tablet by mouth daily. 04/25/13  Yes Historical Provider, MD  lisinopril (PRINIVIL,ZESTRIL) 2.5 MG tablet TAKE 1 TABLET BY MOUTH  DAILY 05/25/16  Yes [name], DO  lovastatin (MEVACOR) 40 MG tablet TAKE 1 TABLET BY MOUTH  DAILY 05/25/16  Yes [name], DO  magnesium oxide (MAG-OX) 400 MG tablet Take 400 mg by mouth daily.   Yes Historical Provider, MD  Melatonin 1 MG TABS Take 3 tablets by mouth at bedtime.   Yes Historical Provider, MD  metFORMIN (GLUCOPHAGE) 500 MG tablet TAKE 1 TABLET BY MOUTH  DAILY 05/25/16  Yes Alan Dance, DO  Multiple Vitamin (MULTIVITAMIN) tablet Take 1 tablet by mouth daily.   Yes Historical Provider, MD  Omega-3 Fatty Acids (FISH OIL) 1000 MG CAPS Take 1 capsule by mouth daily. 04/25/13  Yes Historical Provider, MD  omeprazole (PRILOSEC) 20 MG capsule TAKE 1 CAPSULE BY MOUTH  DAILY 05/25/16  Yes Alan Dance, DO   Ct Hip Right Wo Contrast  Result Date: 08/04/2016 CLINICAL DATA:  Periprostatic fracture of the right hip. EXAM: CT OF THE RIGHT HIP WITHOUT CONTRAST TECHNIQUE: Multidetector CT imaging  of the right hip was performed according to the standard protocol. Multiplanar CT image reconstructions were also generated. COMPARISON:  Same day radiographs of the right hip FINDINGS: Bones/Joint/Cartilage There is an acute, closed periprosthetic fracture of the right femur. A sagittal oblique fracture is noted through the proximal third of the femoral shaft extending from greater trochanter (just lateral to the femoral stem) and converging with a horizontal oblique fracture near the junction of the proximal middle third of the femoral shaft, exiting laterally. Slight medial  displacement of the distal fracture fragment by 3 mm with 4 mm of anterior displacement is noted at this level. The horizontal oblique fracture is seen approximately 2.5 cm from the tip of the femoral stem. Small posterior butterfly fragment noted posteriorly just proximal to the horizontal oblique fracture, series 7, image 46. Ligaments Suboptimally assessed by CT. Muscles and Tendons No intramuscular hematoma or acute hemorrhage. Soft tissues No focal fluid collection. Mild eccentric positioning of the femoral head prosthetic within the acetabular component suggests asymmetric lining wear. IMPRESSION: 1. Acute, comminuted, closed periprosthetic fracture of the right femur along side the noncemented femoral stem of a total hip arthroplasty. A horizontal oblique fracture component is seen exiting roughly 2.5 cm from the tip of the uncemented femoral stem with minimal medial and anterior displacement of the distal fracture fragment. The larger sagittal oblique fracture is noted from the level of the greater trochanter just lateral to the femoral component converging with a horizontal oblique fracture distally. 2. Asymmetric positioning of the femoral head within the acetabular component suggest presence of asymmetric lining wear. Electronically Signed   By: [name] M.D.   On: 08/04/2016 19:09   Dg Hip Unilat With Pelvis 2-3 Views Right  Result Date: 08/04/2016 CLINICAL DATA:  [place] last night landing on the right hip with pain and limited mobility EXAM: DG HIP (WITH OR WITHOUT PELVIS) 2-3V RIGHT COMPARISON:  None. FINDINGS: There is an oblique fracture of the proximal left femur several cms below the lesser trochanter. In addition there is a nondisplaced fracture of the lateral proximal right femur near the stem of the femoral component. It is noted that the femoral component head is not centered within the right acetabular cup. This could be due to thinning of the plastic insert over time, with an acute  process less likely. The left hip replacement is unremarkable. The bones are osteopenic. The pelvic rami are intact. The SI joints are corticated. IMPRESSION: 1. Oblique fracture of the proximal left subtrochanteric femur. 2. Transverse fracture of the proximal right femur near the femoral stem laterally. 3. Asymmetrical position of the head of the femoral component relative to the acetabular cup of questionable clinical significance. Electronically Signed   By: Ivar Drape M.D.   On: 08/04/2016 14:55   Dg Femur, Min 2 Views Right  Result Date: 08/04/2016 CLINICAL DATA:  Patient slipped and fell. Fracture of the right femur. EXAM: RIGHT FEMUR 2 VIEWS COMPARISON:  None. FINDINGS: Acute, comminuted closed fracture of the proximal femoral shaft surrounding an uncemented femoral stem component of the total hip arthroplasty. A horizontal component is seen laterally approximately 2.5 cm from the tip of the femoral stem. An oblique fracture seen more proximally along the medial aspect. No significant displacement. Asymmetric positioning of the femoral head prosthetic within the acetabular component suggests asymmetric lining wear. IMPRESSION: 1. Acute, closed periprosthetic comminuted fracture of the right femur as above. 2. Asymmetric lining wear suggested given the non centered appearance of the femoral  head within the acetabular component. Electronically Signed   By: [name] M.D.   On: 08/04/2016 19:13    Positive ROS: All other systems have been reviewed and were otherwise negative with the exception of those mentioned in the HPI and as above.  Physical Exam: General: Alert, no acute distress Cardiovascular: No pedal edema Respiratory: No cyanosis, no use of accessory musculature GI: No organomegaly, abdomen is soft and non-tender Skin: No lesions in the area of chief complaint Neurologic: Sensation intact distally Psychiatric: Patient is competent for consent with normal mood and affect Lymphatic:  No axillary or cervical lymphadenopathy  MUSCULOSKELETAL: Examination of the right hip reveals a healed posterior incision. He has pain with attempted logrolling of the hip. He is neurovascularly intact distally.  Assessment: Right [place] B2 periprosthetic femur fracture with polyliner wear  Plan: I discussed the situation with the patient. He has a failed right total hip arthroplasty secondary to femur fracture and loose femoral component. He is going to require revision right total hip arthroplasty. He will also need a polyliner exchange versus revision of the acetabular component. We discussed the risks, benefits, and alternatives. We will tentatively plan for surgery today, however due to implant availability, we may need to reschedule him for Friday morning. Continue nothing by mouth for now. Hold chemical DVT prophylaxis for now.   The risks, benefits, and alternatives were discussed with the patient. There are risks associated with the surgery including, but not limited to, problems with anesthesia (death), infection, instability (giving out of the joint), dislocation, differences in leg length/angulation/rotation, fracture of bones, loosening or failure of implants, malunion, nonunion, hematoma (blood accumulation) which may require surgical drainage, blood clots, pulmonary embolism, nerve injury (foot drop and lateral thigh numbness), and blood vessel injury. The patient understands these risks and elects to proceed.  Giavanna Kang, Horald Pollen, MD Cell [phone]    08/05/2016 8:12 AM

## 2016-08-05 NOTE — Progress Notes (Signed)
Patient arrived to unit [place] from emergency department.Assisted patient to bed by nursing staff.Patient denies pain except with movement slight external rotation noted right leg.Patient alert and oriented x 4 .Educated patient to nursing unit ,call bell and fall risk policy.Patient is at risk fall yellow arm bracelet and yellow socks applied.Patient verbalized understanding not to get up out of bed. No acute distress noted at present time will continue to monitor.

## 2016-08-05 NOTE — Progress Notes (Signed)
Patient surgical screen came back positive for for staphylococcus aureus .Dr.Wallace notified and protocol for positive staphylococcus aureus initiated.Patient informed no questions at present time.

## 2016-08-05 NOTE — Progress Notes (Signed)
OR unable to accommodate surgery today due to high volume of cases & emergencies. Will schedule surgery for Friday. Patient may have diet. Will give Lovenox one time dose today. NPO after MN Thursday night.

## 2016-08-05 NOTE — Anesthesia Preprocedure Evaluation (Addendum)
Anesthesia Evaluation  Patient identified by MRN, date of birth, ID band Patient awake    Reviewed: Allergy & Precautions, NPO status , Patient's Chart, lab work & pertinent test results  Airway Mallampati: II  TM Distance: >3 FB Neck ROM: Full    Dental no notable dental hx. (+) Edentulous Upper, Edentulous Lower   Pulmonary former smoker,    Pulmonary exam normal breath sounds clear to auscultation       Cardiovascular hypertension, Normal cardiovascular exam Rhythm:Regular Rate:Normal     Neuro/Psych    GI/Hepatic GERD  ,  Endo/Other  diabetes  Renal/GU Renal InsufficiencyRenal disease     Musculoskeletal   Abdominal   Peds  Hematology negative hematology ROS (+)   Anesthesia Other Findings   Reproductive/Obstetrics                                                            Anesthesia Evaluation  Patient identified by MRN, date of birth, ID band Patient awake    Reviewed: Allergy & Precautions, NPO status , Patient's Chart, lab work & pertinent test results  Airway        Dental   Pulmonary former smoker,           Cardiovascular hypertension,      Neuro/Psych    GI/Hepatic GERD  ,  Endo/Other  diabetes  Renal/GU Renal InsufficiencyRenal disease     Musculoskeletal   Abdominal   Peds  Hematology negative hematology ROS (+)   Anesthesia Other Findings   Reproductive/Obstetrics                             Anesthesia Physical Anesthesia Plan  ASA: III  Anesthesia Plan: General   Post-op Pain Management:    Induction: Intravenous  Airway Management Planned: Oral ETT  Additional Equipment:   Intra-op Plan:   Post-operative Plan: Extubation in OR  Informed Consent: I have reviewed the patients History and Physical, chart, labs and discussed the procedure including the risks, benefits and alternatives for the  proposed anesthesia with the patient or authorized representative who has indicated his/her understanding and acceptance.   Dental advisory given  Plan Discussed with: CRNA  Anesthesia Plan Comments:         Anesthesia Quick Evaluation  Anesthesia Physical Anesthesia Plan  ASA: III  Anesthesia Plan: General   Post-op Pain Management:    Induction: Intravenous  Airway Management Planned: Oral ETT  Additional Equipment:   Intra-op Plan:   Post-operative Plan: Extubation in OR  Informed Consent: I have reviewed the patients History and Physical, chart, labs and discussed the procedure including the risks, benefits and alternatives for the proposed anesthesia with the patient or authorized representative who has indicated his/her understanding and acceptance.   Dental advisory given  Plan Discussed with: CRNA  Anesthesia Plan Comments:         Anesthesia Quick Evaluation

## 2016-08-06 LAB — CBC
HEMATOCRIT: 41.1 % (ref 39.0–52.0)
HEMOGLOBIN: 13.7 g/dL (ref 13.0–17.0)
MCH: 27.3 pg (ref 26.0–34.0)
MCHC: 33.3 g/dL (ref 30.0–36.0)
MCV: 82 fL (ref 78.0–100.0)
Platelets: 253 10*3/uL (ref 150–400)
RBC: 5.01 MIL/uL (ref 4.22–5.81)
RDW: 14.5 % (ref 11.5–15.5)
WBC: 8.4 10*3/uL (ref 4.0–10.5)

## 2016-08-06 LAB — BASIC METABOLIC PANEL
ANION GAP: 9 (ref 5–15)
BUN: 12 mg/dL (ref 6–20)
CHLORIDE: 105 mmol/L (ref 101–111)
CO2: 24 mmol/L (ref 22–32)
Calcium: 9.2 mg/dL (ref 8.9–10.3)
Creatinine, Ser: 1.1 mg/dL (ref 0.61–1.24)
GFR calc non Af Amer: >60 mL/min (ref >60)
Glucose, Bld: 127 mg/dL — ABNORMAL HIGH (ref 65–99)
POTASSIUM: 4.2 mmol/L (ref 3.5–5.1)
SODIUM: 138 mmol/L (ref 135–145)

## 2016-08-06 LAB — GLUCOSE, CAPILLARY: GLUCOSE-CAPILLARY: 117 mg/dL — AB (ref 65–99)

## 2016-08-06 NOTE — Progress Notes (Signed)
Patient seen and evaluated. Pain is controlled. Plan for surgery tomorrow. Hold chemical DVT ppx today (received lovenox yesterday). NPO after MN.

## 2016-08-06 NOTE — Progress Notes (Signed)
Family Medicine Teaching Service Daily Progress Note Intern Pager: [phone]  Patient name: Alan Hurst Medical record number: [id number] Date of birth: 11/27/1945 Age: 71 y.o. Gender: male  Primary Care Provider: [name], DO Consultants: Ortho  Code Status: FULL  Pt Overview and Major Events to Date:  Admit 3/28  Assessment and Plan: Alan Hurst is a 71 y.o. male presenting with hip pain s/p fall. 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   Right Periprosthetic Femur Fracture: S/p mechanical fall. Xray right hip: Transverse fracture of the proximal right femur near the femoral stem laterally. Questionable left trochanter fracture on x-ray but given lack of TTP and ability to weight bear ortho suspected artifact.  CT right hip revealed acute, comminuted, closed periprosthetic fracture of the right femur along side the noncemented femoral stem of a total hip arthroplasty. A horizontal oblique fracture component was seen exiting roughly 2.5 cm from the tip of the uncemented femoral stem with minimal medial and anterior displacement of the distal fracture fragment. Patient reports pain fairly tolerable at admission and he is neurovascularly intact. Revised Cardiac Risk Index for Pre-operative risk is 0 with 0.4% chance of major cardiac risk.  -Ortho consulted, appreciate recs  -given CT right hip findings, Dr. Stann Mainland has asked total joint specialist Dr. Lyla Glassing to assume care; plan for OR for surgical management  (3/30) OR unable to accommodate surgery 3/28 due to high volume of cases.  -Diet carb modified today, 1 dose Lovenox given then NPO tonight for surgery tomorrow.  SCDs for now.  -pain control: tylenol 650 mg q6h prn and morphine 2 mg q4h prn .  Patient has not required the morphine.  -PT/OT consult   HLD: Last lipid panel Jan 2018: Cholesterol 160, HDL 51, LDL 81, Triglycerides 140. On Lovastatin 40 mg and ASA 81 mg daily  at home.   -pravastatin (formulary substitute for home lovastatin)  -ASCVD risk calculated to be >7.5%.  Discontinued lovastatin and started on high intensity statin Lipitor 40 mg daily 3/28.   -hold ASA for surgery   T2DM: Well controlled with recent A1c 5.9 in Jan 2018. On Metformin 500 mg daily.  -CBG daily  -hold metformin  -will order SSI if necessary in setting of stress during hospitalization   CKD: Cr 1.10 this morning. Baseline Cr appears to be 1.2. Likely related to h/o T2DM.  -monitor BMET -avoid nephrotoxic agents   FEN/GI: carb modified with NPO at MN,  NS 125 cc/hr x12h overnight, Protonix  Prophylaxis: 1 dose Lovenox then hold for OR   Disposition: Dispo pending clinical improvement. Plan for OR tomorrow 3/30.   Subjective:  Patient appears comfortable in bed.  Reports the pain is tolerable.  Has not required any of his morphine.  Would like for [place] to have SW speak with him regarding care for his wife at home who has MS.  Will place CSW consult.    Objective: Temp:  [97.7 F (36.5 C)-98.6 F (37 C)] 97.7 F (36.5 C) (03/29 0416) Pulse Rate:  [80-87] 84 (03/29 0416) Resp:  [16] 16 (03/29 0416) BP: (100-135)/(67) 135/67 (03/29 0416) SpO2:  [95 %-97 %] 95 % (03/29 0416)   Physical Exam: General: pleasant male lying in bed, NAD   Eyes: EOMI, PERRL  ENTM: MMM. o/p clear  Neck: Supple  Cardiovascular: RRR. No MRG noted  Respiratory: CTAB. Normal WOB.  Gastrointestinal: +BS, soft, NTND  MSK: TTP over right hip  and proximal femur Derm: Skin is warm and dry  Neuro: Sensation to LE grossly intact. Able to wiggle toes bilaterally. Strength 5/5 in LE bilaterally.  Psych: Appropriate mood and affect.   Laboratory:  Recent Labs Lab 08/04/16 1628 08/05/16 0237 08/06/16 0804  WBC 9.7 7.1 8.4  HGB 13.0 12.6* 13.7  HCT 39.3 37.7* 41.1  PLT 243 235 253    Recent Labs Lab 08/04/16 1628 08/05/16 0237 08/06/16 0804  NA 137 137 138  K 4.0 3.8 4.2  CL 103  104 105  CO2 '25 25 24  '$ BUN '11 10 12  '$ CREATININE 1.02 1.07 1.10  CALCIUM 8.8* 8.9 9.2  GLUCOSE 96 150* 127*   Imaging/Diagnostic Tests: No results found.   Lovenia Kim, MD 08/06/2016, 12:17 PM PGY-1, [facility] Intern pager: [phone], text pages welcome

## 2016-08-06 NOTE — Plan of Care (Signed)
Problem: Safety: Goal: Ability to remain free from injury will improve Outcome: Progressing Safety precautions maintained  Problem: Tissue Perfusion: Goal: Risk factors for ineffective tissue perfusion will decrease Outcome: Progressing On Lovenox, SCDs are on, no signs of DVT noted  Problem: Activity: Goal: Ability to ambulate and perform ADLs will improve Outcome: Not Progressing On bed rest  Problem: Physical Regulation: Goal: Will remain free from infection Outcome: Progressing No s/s of infection  Problem: Pain Management: Goal: Pain level will decrease Outcome: Progressing Medicated once with Tylenol for pain with full relief

## 2016-08-07 ENCOUNTER — Inpatient Hospital Stay (HOSPITAL_COMMUNITY): Payer: Medicare Other

## 2016-08-07 ENCOUNTER — Encounter (HOSPITAL_COMMUNITY)
Admission: EM | Disposition: A | Discharge status: Home Health | Payer: Self-pay | Source: Home / Self Care | Attending: Family Medicine

## 2016-08-07 ENCOUNTER — Inpatient Hospital Stay (HOSPITAL_COMMUNITY): Payer: Medicare Other | Admitting: Anesthesiology

## 2016-08-07 ENCOUNTER — Encounter (HOSPITAL_COMMUNITY): Payer: Self-pay | Admitting: Anesthesiology

## 2016-08-07 DIAGNOSIS — Z96649 Presence of unspecified artificial hip joint: Secondary | ICD-10-CM

## 2016-08-07 DIAGNOSIS — M978XXA Periprosthetic fracture around other internal prosthetic joint, initial encounter: Secondary | ICD-10-CM

## 2016-08-07 HISTORY — PX: TOTAL HIP REVISION: SHX763

## 2016-08-07 LAB — BASIC METABOLIC PANEL
ANION GAP: 10 (ref 5–15)
BUN: 15 mg/dL (ref 6–20)
CALCIUM: 9.1 mg/dL (ref 8.9–10.3)
CO2: 24 mmol/L (ref 22–32)
Chloride: 104 mmol/L (ref 101–111)
Creatinine, Ser: 1.12 mg/dL (ref 0.61–1.24)
GFR calc Af Amer: >60 mL/min (ref >60)
GFR calc non Af Amer: >60 mL/min (ref >60)
Glucose, Bld: 145 mg/dL — ABNORMAL HIGH (ref 65–99)
Potassium: 3.9 mmol/L (ref 3.5–5.1)
Sodium: 138 mmol/L (ref 135–145)

## 2016-08-07 LAB — CBC
HCT: 40 % (ref 39.0–52.0)
HEMOGLOBIN: 13.4 g/dL (ref 13.0–17.0)
MCH: 27.1 pg (ref 26.0–34.0)
MCHC: 33.5 g/dL (ref 30.0–36.0)
MCV: 80.8 fL (ref 78.0–100.0)
Platelets: 257 10*3/uL (ref 150–400)
RBC: 4.95 MIL/uL (ref 4.22–5.81)
RDW: 14.6 % (ref 11.5–15.5)
WBC: 7.4 10*3/uL (ref 4.0–10.5)

## 2016-08-07 LAB — ABO/RH: ABO/RH(D): O POS

## 2016-08-07 LAB — TYPE AND SCREEN
ABO/RH(D): O POS
Antibody Screen: NEGATIVE

## 2016-08-07 LAB — GLUCOSE, CAPILLARY: GLUCOSE-CAPILLARY: 120 mg/dL — AB (ref 65–99)

## 2016-08-07 SURGERY — TOTAL HIP REVISION
Anesthesia: General | Site: Hip | Laterality: Right

## 2016-08-07 MED ORDER — ALBUMIN HUMAN 5 % IV SOLN
INTRAVENOUS | Status: DC | PRN
  Administered 2016-08-07 (×2): via INTRAVENOUS

## 2016-08-07 MED ORDER — HYDROMORPHONE HCL 1 MG/ML IJ SOLN
INTRAMUSCULAR | Status: DC | PRN
  Administered 2016-08-07 (×2): 0.5 mg via INTRAVENOUS

## 2016-08-07 MED ORDER — 0.9 % SODIUM CHLORIDE (POUR BTL) OPTIME
TOPICAL | Status: DC | PRN
  Administered 2016-08-07: 1000 mL

## 2016-08-07 MED ORDER — SUGAMMADEX SODIUM 200 MG/2ML IV SOLN
INTRAVENOUS | Status: DC | PRN
  Administered 2016-08-07: 200 mg via INTRAVENOUS

## 2016-08-07 MED ORDER — DEXTROSE 5 % IV SOLN
INTRAVENOUS | Status: DC | PRN
  Administered 2016-08-07: 09:00:00 via INTRAVENOUS

## 2016-08-07 MED ORDER — MENTHOL 3 MG MT LOZG: 1.0000 | LOZENGE | OROMUCOSAL | Status: DC | PRN

## 2016-08-07 MED ORDER — PHENYLEPHRINE HCL 10 MG/ML IJ SOLN
INTRAMUSCULAR | Status: DC | PRN
  Administered 2016-08-07 (×3): 40 ug via INTRAVENOUS

## 2016-08-07 MED ORDER — CEFAZOLIN SODIUM 1 G IJ SOLR
INTRAMUSCULAR | Status: AC
  Filled 2016-08-07: qty 20

## 2016-08-07 MED ORDER — MIDAZOLAM HCL 2 MG/2ML IJ SOLN
INTRAMUSCULAR | Status: AC
  Filled 2016-08-07: qty 2

## 2016-08-07 MED ORDER — PHENYLEPHRINE 40 MCG/ML (10ML) SYRINGE FOR IV PUSH (FOR BLOOD PRESSURE SUPPORT)
PREFILLED_SYRINGE | INTRAVENOUS | Status: AC
  Filled 2016-08-07: qty 20

## 2016-08-07 MED ORDER — VANCOMYCIN HCL 1000 MG IV SOLR
INTRAVENOUS | Status: DC | PRN
  Administered 2016-08-07: 1000 mg via INTRAVENOUS

## 2016-08-07 MED ORDER — CEFAZOLIN SODIUM-DEXTROSE 2-3 GM-% IV SOLR
INTRAVENOUS | Status: DC | PRN
  Administered 2016-08-07 (×2): 2 g via INTRAVENOUS

## 2016-08-07 MED ORDER — ROCURONIUM BROMIDE 100 MG/10ML IV SOLN
INTRAVENOUS | Status: DC | PRN
Start: 2016-08-07 — End: 2016-08-07
  Administered 2016-08-07: 50 mg via INTRAVENOUS

## 2016-08-07 MED ORDER — PHENOL 1.4 % MT LIQD: 1.0000 | OROMUCOSAL | Status: DC | PRN

## 2016-08-07 MED ORDER — DEXAMETHASONE SODIUM PHOSPHATE 10 MG/ML IJ SOLN
INTRAMUSCULAR | Status: AC
  Filled 2016-08-07: qty 1

## 2016-08-07 MED ORDER — VECURONIUM BROMIDE 10 MG IV SOLR
INTRAVENOUS | Status: DC | PRN
  Administered 2016-08-07: 2 mg via INTRAVENOUS
  Administered 2016-08-07: 4 mg via INTRAVENOUS
  Administered 2016-08-07 (×2): 2 mg via INTRAVENOUS

## 2016-08-07 MED ORDER — FENTANYL CITRATE (PF) 250 MCG/5ML IJ SOLN
INTRAMUSCULAR | Status: AC
  Filled 2016-08-07: qty 5

## 2016-08-07 MED ORDER — PROPOFOL 500 MG/50ML IV EMUL
INTRAVENOUS | Status: DC | PRN
  Administered 2016-08-07: 50 ug/kg/min via INTRAVENOUS

## 2016-08-07 MED ORDER — PHENYLEPHRINE 40 MCG/ML (10ML) SYRINGE FOR IV PUSH (FOR BLOOD PRESSURE SUPPORT)
PREFILLED_SYRINGE | INTRAVENOUS | Status: AC
  Filled 2016-08-07: qty 10

## 2016-08-07 MED ORDER — METOCLOPRAMIDE HCL 5 MG/ML IJ SOLN: 5.0000 mg | Freq: Three times a day (TID) | INTRAMUSCULAR | Status: DC | PRN

## 2016-08-07 MED ORDER — ONDANSETRON HCL 4 MG/2ML IJ SOLN
INTRAMUSCULAR | Status: DC | PRN
  Administered 2016-08-07 (×2): 4 mg via INTRAVENOUS

## 2016-08-07 MED ORDER — ROCURONIUM BROMIDE 50 MG/5ML IV SOSY
PREFILLED_SYRINGE | INTRAVENOUS | Status: AC
  Filled 2016-08-07: qty 5

## 2016-08-07 MED ORDER — APIXABAN 2.5 MG PO TABS
2.5000 mg | ORAL_TABLET | Freq: Two times a day (BID) | ORAL | Status: DC
  Administered 2016-08-08 – 2016-08-10 (×5): 2.5 mg via ORAL
  Filled 2016-08-07 (×5): qty 1

## 2016-08-07 MED ORDER — FENTANYL CITRATE (PF) 100 MCG/2ML IJ SOLN
INTRAMUSCULAR | Status: DC | PRN
  Administered 2016-08-07 (×3): 50 ug via INTRAVENOUS
  Administered 2016-08-07: 100 ug via INTRAVENOUS
  Administered 2016-08-07: 50 ug via INTRAVENOUS
  Administered 2016-08-07: 25 ug via INTRAVENOUS
  Administered 2016-08-07: 50 ug via INTRAVENOUS
  Administered 2016-08-07: 25 ug via INTRAVENOUS
  Administered 2016-08-07: 50 ug via INTRAVENOUS

## 2016-08-07 MED ORDER — VECURONIUM BROMIDE 10 MG IV SOLR
INTRAVENOUS | Status: AC
  Filled 2016-08-07: qty 10

## 2016-08-07 MED ORDER — ONDANSETRON HCL 4 MG/2ML IJ SOLN
INTRAMUSCULAR | Status: AC
  Filled 2016-08-07: qty 2

## 2016-08-07 MED ORDER — LACTATED RINGERS IV SOLN
INTRAVENOUS | Status: DC | PRN
  Administered 2016-08-07 (×4): via INTRAVENOUS

## 2016-08-07 MED ORDER — PROMETHAZINE HCL 25 MG/ML IJ SOLN: 6.2500 mg | INTRAMUSCULAR | Status: DC | PRN

## 2016-08-07 MED ORDER — PROPOFOL 10 MG/ML IV BOLUS
INTRAVENOUS | Status: AC
  Filled 2016-08-07: qty 20

## 2016-08-07 MED ORDER — SODIUM CHLORIDE 0.9 % IR SOLN
Status: DC | PRN
  Administered 2016-08-07: 3000 mL

## 2016-08-07 MED ORDER — HYDROCODONE-ACETAMINOPHEN 5-325 MG PO TABS
1.0000 | ORAL_TABLET | Freq: Four times a day (QID) | ORAL | Status: DC | PRN
  Administered 2016-08-07 – 2016-08-10 (×11): 2 via ORAL
  Filled 2016-08-07 (×11): qty 2

## 2016-08-07 MED ORDER — ARTIFICIAL TEARS OP OINT
TOPICAL_OINTMENT | OPHTHALMIC | Status: AC
  Filled 2016-08-07: qty 3.5

## 2016-08-07 MED ORDER — OXYCODONE HCL 5 MG/5ML PO SOLN: 5.0000 mg | Freq: Once | ORAL | Status: DC | PRN

## 2016-08-07 MED ORDER — PROPOFOL 10 MG/ML IV BOLUS
INTRAVENOUS | Status: DC | PRN
  Administered 2016-08-07: 120 mg via INTRAVENOUS

## 2016-08-07 MED ORDER — OXYCODONE HCL 5 MG PO TABS: 5.0000 mg | ORAL_TABLET | Freq: Once | ORAL | Status: DC | PRN

## 2016-08-07 MED ORDER — EPHEDRINE SULFATE 50 MG/ML IJ SOLN
INTRAMUSCULAR | Status: DC | PRN
  Administered 2016-08-07: 15 mg via INTRAVENOUS
  Administered 2016-08-07: 5 mg via INTRAVENOUS

## 2016-08-07 MED ORDER — CEFAZOLIN SODIUM-DEXTROSE 2-4 GM/100ML-% IV SOLN
2.0000 g | Freq: Four times a day (QID) | INTRAVENOUS | Status: AC
  Administered 2016-08-07 – 2016-08-08 (×2): 2 g via INTRAVENOUS
  Filled 2016-08-07 (×2): qty 100

## 2016-08-07 MED ORDER — LIDOCAINE HCL (CARDIAC) 20 MG/ML IV SOLN
INTRAVENOUS | Status: DC | PRN
  Administered 2016-08-07: 60 mg via INTRAVENOUS

## 2016-08-07 MED ORDER — SUGAMMADEX SODIUM 200 MG/2ML IV SOLN
INTRAVENOUS | Status: AC
  Filled 2016-08-07: qty 2

## 2016-08-07 MED ORDER — MIDAZOLAM HCL 5 MG/5ML IJ SOLN
INTRAMUSCULAR | Status: DC | PRN
  Administered 2016-08-07: 2 mg via INTRAVENOUS

## 2016-08-07 MED ORDER — LIDOCAINE 2% (20 MG/ML) 5 ML SYRINGE
INTRAMUSCULAR | Status: AC
  Filled 2016-08-07: qty 5

## 2016-08-07 MED ORDER — METOCLOPRAMIDE HCL 5 MG/ML IJ SOLN
INTRAMUSCULAR | Status: AC
  Filled 2016-08-07: qty 2

## 2016-08-07 MED ORDER — METOCLOPRAMIDE HCL 5 MG PO TABS: 5.0000 mg | ORAL_TABLET | Freq: Three times a day (TID) | ORAL | Status: DC | PRN

## 2016-08-07 MED ORDER — DEXTROSE 5 % IV SOLN
INTRAVENOUS | Status: DC | PRN
  Administered 2016-08-07: 08:00:00 via INTRAVENOUS

## 2016-08-07 MED ORDER — PHENYLEPHRINE HCL 10 MG/ML IJ SOLN
INTRAMUSCULAR | Status: DC | PRN
  Administered 2016-08-07: 45 ug/min via INTRAVENOUS

## 2016-08-07 MED ORDER — ARTIFICIAL TEARS OP OINT
TOPICAL_OINTMENT | OPHTHALMIC | Status: DC | PRN
  Administered 2016-08-07: 1 via OPHTHALMIC

## 2016-08-07 MED ORDER — ONDANSETRON HCL 4 MG PO TABS: 4.0000 mg | ORAL_TABLET | Freq: Four times a day (QID) | ORAL | Status: DC | PRN

## 2016-08-07 MED ORDER — HYDROMORPHONE HCL 1 MG/ML IJ SOLN
0.2500 mg | INTRAMUSCULAR | Status: DC | PRN
Start: 2016-08-07 — End: 2016-08-07

## 2016-08-07 MED ORDER — EPHEDRINE 5 MG/ML INJ
INTRAVENOUS | Status: AC
  Filled 2016-08-07: qty 10

## 2016-08-07 MED ORDER — DEXAMETHASONE SODIUM PHOSPHATE 10 MG/ML IJ SOLN
INTRAMUSCULAR | Status: DC | PRN
  Administered 2016-08-07: 10 mg via INTRAVENOUS

## 2016-08-07 MED ORDER — HYDROMORPHONE HCL 1 MG/ML IJ SOLN
INTRAMUSCULAR | Status: AC
  Filled 2016-08-07: qty 1

## 2016-08-07 MED ORDER — ONDANSETRON HCL 4 MG/2ML IJ SOLN
4.0000 mg | Freq: Four times a day (QID) | INTRAMUSCULAR | Status: DC | PRN
Start: 2016-08-07 — End: 2016-08-11

## 2016-08-07 SURGICAL SUPPLY — 79 items
ADH SKN CLS APL DERMABOND .7 (GAUZE/BANDAGES/DRESSINGS) ×1
BAG DECANTER FOR FLEXI CONT (MISCELLANEOUS) ×3 IMPLANT
BIT DRILL AO MATTA 3.2MX230M (BIT) IMPLANT
BLADE SAW SGTL 18X1.27X75 (BLADE) ×2 IMPLANT
BLADE SAW SGTL 18X1.27X75MM (BLADE) ×1
BUR EGG ELITE 4.0 (BURR) ×1 IMPLANT
BUR EGG ELITE 4.0MM (BURR) ×1
CABLE CERLAGE W/CRIMP 1.8 (Cable) ×3 IMPLANT
CABLE CERLAGE W/CRIMP 1.8MM (Cable) ×3 IMPLANT
CHLORAPREP W/TINT 26ML (MISCELLANEOUS) ×3 IMPLANT
COVER SURGICAL LIGHT HANDLE (MISCELLANEOUS) ×3 IMPLANT
DERMABOND ADVANCED (GAUZE/BANDAGES/DRESSINGS) ×2
DERMABOND ADVANCED .7 DNX12 (GAUZE/BANDAGES/DRESSINGS) ×1 IMPLANT
DRAPE C-ARM 42X72 X-RAY (DRAPES) ×2 IMPLANT
DRAPE C-ARMOR (DRAPES) ×2 IMPLANT
DRAPE HALF SHEET 40X57 (DRAPES) ×2 IMPLANT
DRAPE HIP W/POCKET STRL (DRAPE) ×3 IMPLANT
DRAPE INCISE IOBAN 66X45 STRL (DRAPES) ×5 IMPLANT
DRAPE INCISE IOBAN 85X60 (DRAPES) ×3 IMPLANT
DRAPE POUCH INSTRU U-SHP 10X18 (DRAPES) ×3 IMPLANT
DRAPE SURG 17X11 SM STRL (DRAPES) ×3 IMPLANT
DRAPE U-SHAPE 47X51 STRL (DRAPES) ×5 IMPLANT
DRILL BIT AO MATTA 3.2MX230M (BIT) ×3
DRSG AQUACEL AG ADV 3.5X 6 (GAUZE/BANDAGES/DRESSINGS) ×2 IMPLANT
DRSG AQUACEL AG ADV 3.5X10 (GAUZE/BANDAGES/DRESSINGS) ×3 IMPLANT
DRSG AQUACEL AG ADV 3.5X14 (GAUZE/BANDAGES/DRESSINGS) ×2 IMPLANT
DRSG MEPILEX BORDER 4X4 (GAUZE/BANDAGES/DRESSINGS) ×2 IMPLANT
ELECT BLADE 4.0 EZ CLEAN MEGAD (MISCELLANEOUS) ×3
ELECT BLADE TIP CTD 4 INCH (ELECTRODE) ×3 IMPLANT
ELECT REM PT RETURN 9FT ADLT (ELECTROSURGICAL) ×3
ELECTRODE BLDE 4.0 EZ CLN MEGD (MISCELLANEOUS) IMPLANT
ELECTRODE REM PT RTRN 9FT ADLT (ELECTROSURGICAL) ×1 IMPLANT
EVACUATOR 1/8 PVC DRAIN (DRAIN) ×2 IMPLANT
FACESHIELD WRAPAROUND (MASK) ×3 IMPLANT
FACESHIELD WRAPAROUND OR TEAM (MASK) ×4 IMPLANT
GLOVE BIO SURGEON STRL SZ8.5 (GLOVE) ×3 IMPLANT
GLOVE BIOGEL PI IND STRL 8.5 (GLOVE) ×2 IMPLANT
GLOVE BIOGEL PI INDICATOR 8.5 (GLOVE) ×4
GOWN SPEC L3 XXLG W/TWL (GOWN DISPOSABLE) ×6 IMPLANT
GOWN STRL REUS W/TWL 2XL LVL3 (GOWN DISPOSABLE) ×5 IMPLANT
GUIDEROD T2 3X1000 (ROD) ×2 IMPLANT
HANDPIECE INTERPULSE COAX TIP (DISPOSABLE) ×3
HEAD FEM +0XOFST TPR 32X (Joint) IMPLANT
HIP BIOLOX FEM HD 32MM (Joint) ×3 IMPLANT
HOOD PEEL AWAY FLYTE STAYCOOL (MISCELLANEOUS) ×6 IMPLANT
INSERT ACTB 10D 58X32XSTRL LF (Orthopedic Implant) IMPLANT
INSERT OSTEO CFIRE ACET (Orthopedic Implant) ×3 IMPLANT
INSRT ACTB 10D 58X32XSTRL LF (Orthopedic Implant) ×1 IMPLANT
KIT BASIN OR (CUSTOM PROCEDURE TRAY) ×3 IMPLANT
MANIFOLD NEPTUNE II (INSTRUMENTS) ×3 IMPLANT
NDL SAFETY ECLIPSE 18X1.5 (NEEDLE) ×1 IMPLANT
NEEDLE HYPO 18GX1.5 SHARP (NEEDLE) ×3
NS IRRIG 1000ML POUR BTL (IV SOLUTION) ×3 IMPLANT
PACK TOTAL JOINT (CUSTOM PROCEDURE TRAY) ×3 IMPLANT
PILLOW ABDUCTION HIP (SOFTGOODS) ×2 IMPLANT
RASP HELIOCORDIAL MED (MISCELLANEOUS) ×2 IMPLANT
REAMER SHAFT BIXCUT (INSTRUMENTS) ×2 IMPLANT
REST MOD HIP SYS SZ 55MMP0 V40 (Hips) ×3 IMPLANT
SCREW CORTEX ST MATTA 4.5X40MM (Screw) ×2 IMPLANT
SCREW LOCKING 30X5.0MM (Screw) ×2 IMPLANT
SET HNDPC FAN SPRY TIP SCT (DISPOSABLE) IMPLANT
STAPLER VISISTAT 35W (STAPLE) ×4 IMPLANT
STEM FEM MODULAR SYS 195X17MM (Stem) ×2 IMPLANT
SUCTION FRAZIER HANDLE 10FR (MISCELLANEOUS) ×2
SUCTION TUBE FRAZIER 10FR DISP (MISCELLANEOUS) ×1 IMPLANT
SUT ETHIBOND 2 V 37 (SUTURE) ×6 IMPLANT
SUT ETHILON 3 0 PS 1 (SUTURE) ×2 IMPLANT
SUT MNCRL AB 4-0 PS2 18 (SUTURE) ×3 IMPLANT
SUT MON AB 2-0 CT1 36 (SUTURE) ×8 IMPLANT
SUT VIC AB 1 CT1 27 (SUTURE) ×6
SUT VIC AB 1 CT1 27XBRD ANBCTR (SUTURE) ×1 IMPLANT
SUT VIC AB 2-0 CT1 27 (SUTURE) ×3
SUT VIC AB 2-0 CT1 TAPERPNT 27 (SUTURE) ×1 IMPLANT
SUT VLOC 180 0 24IN GS25 (SUTURE) ×3 IMPLANT
SYR 50ML LL SCALE MARK (SYRINGE) ×1 IMPLANT
SYSTEM RST MD HIP SZ55MMP0 V40 (Hips) IMPLANT
TOWEL OR 17X26 10 PK STRL BLUE (TOWEL DISPOSABLE) ×6 IMPLANT
TRAY CATH 16FR W/PLASTIC CATH (SET/KITS/TRAYS/PACK) IMPLANT
TRAY FOLEY W/METER SILVER 16FR (SET/KITS/TRAYS/PACK) ×2 IMPLANT

## 2016-08-07 NOTE — ED Provider Notes (Signed)
[facility] [place] [facility] DEPT Provider Note   CSN: [id number] Arrival date & time: 08/04/16  1301     History   Chief Complaint Chief Complaint  Patient presents with  . Hip Pain  . Fall    HPI Alan Hurst is a 71 y.o. male.  HPI Patient presents to the emergency department with pain in his right hip following a fall.  The patient states that he slipped on some water in his kitchen.  The patient states he has had his hips replaced, but feels pain in his right hip.  Patient had no other injuries. The patient denies chest pain, shortness of breath, headache,blurred vision, neck pain, fever, cough, weakness, numbness, dizziness, anorexia, edema, abdominal pain, nausea, vomiting, diarrhea, rash, back pain, dysuria, hematemesis, bloody stool, near syncope, or syncope. Past Medical History:  Diagnosis Date  . GERD (gastroesophageal reflux disease) 12/05/2015   Well controlled on meds  . HLD (hyperlipidemia) 12/05/2015   10+ yrs at least.   . 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 Vitamin B deficiency 12/05/2015  . Vitamin D insufficiency 12/05/2015    Patient Active Problem List   Diagnosis Date Noted  . 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 Fall   . 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 Essential hypertension   . Femur fracture ([place]) 08/04/2016  . Overweight (BMI 25.0-29.9) 03/02/2016  . Chronic kidney disease- stage 3a 01/02/2016  . Type 2 diabetes mellitus without complication, without long-term current use of insulin ([place]) 12/05/2015  . HLD (hyperlipidemia) 12/05/2015  . GERD (gastroesophageal reflux disease) 12/05/2015  . 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 Vitamin D insufficiency 12/05/2015  . Vitamin B deficiency 12/05/2015  . Recovering alcoholic in remission ([place]) 12/05/2015  . History of tobacco abuse 12/05/2015    Past  Surgical History:  Procedure Laterality Date  . JOINT REPLACEMENT Bilateral    hips       Home Medications    Prior to Admission medications   Medication Sig Start Date End Date Taking? Authorizing Provider  aspirin EC 81 MG tablet Take 1 tablet by mouth daily. 04/25/13  Yes Historical Provider, MD  B Complex Vitamins (VITAMIN-B COMPLEX) TABS Take 1 tablet by mouth daily.   Yes Historical Provider, MD  Cholecalciferol (VITAMIN D) 2000 units tablet Take 1 tablet by mouth daily. 04/25/13  Yes Historical Provider, MD  lisinopril (PRINIVIL,ZESTRIL) 2.5 MG tablet TAKE 1 TABLET BY MOUTH  DAILY 05/25/16  Yes [name], DO  lovastatin (MEVACOR) 40 MG tablet TAKE 1 TABLET BY MOUTH  DAILY 05/25/16  Yes [name], DO  magnesium oxide (MAG-OX) 400 MG tablet Take 400 mg by mouth daily.   Yes Historical Provider, MD  Melatonin 1 MG TABS Take 3 tablets by mouth at bedtime.   Yes Historical Provider, MD  metFORMIN (GLUCOPHAGE) 500 MG tablet TAKE 1 TABLET BY MOUTH  DAILY 05/25/16  Yes [name], DO  Multiple Vitamin (MULTIVITAMIN) tablet Take 1 tablet by mouth daily.   Yes Historical Provider, MD  Omega-3 Fatty Acids (FISH OIL) 1000 MG CAPS Take 1 capsule by mouth daily. 04/25/13  Yes Historical Provider, MD  omeprazole (PRILOSEC) 20 MG capsule TAKE 1 CAPSULE BY MOUTH  DAILY 05/25/16  Yes [name], DO    Family History Family History  Problem Relation Age of Onset  . Alcohol abuse Mother   . Alcohol abuse  Father   . Congestive Heart Failure Father   . Kidney disease Father     Social History Social History  Substance Use Topics  . Smoking status: Former Smoker    Quit date: 05/11/1993  . Smokeless tobacco: Never Used  . Alcohol use No     Allergies   No known allergies   Review of Systems Review of Systems All other systems negative except as documented in the HPI. All pertinent positives and negatives as reviewed in the HPI. Physical Exam Updated Vital  Signs BP 124/74 (BP Location: Right Arm)   Pulse (!) 113   Temp 98.2 F (36.8 C) (Oral)   Resp 14   Ht '5\' 9"'$  (1.753 m)   Wt 90.7 kg   SpO2 96%   BMI 29.53 kg/m   Physical Exam  Constitutional: He is oriented to person, place, and time. He appears well-developed and well-nourished. No distress.  HENT:  Head: Normocephalic and atraumatic.  Mouth/Throat: Oropharynx is clear and moist.  Eyes: Pupils are equal, round, and reactive to light.  Neck: Normal range of motion. Neck supple.  Cardiovascular: Normal rate, regular rhythm and normal heart sounds.  Exam reveals no gallop and no friction rub.   No murmur heard. Pulmonary/Chest: Effort normal and breath sounds normal. No respiratory distress. He has no wheezes.  Musculoskeletal:       Right hip: He exhibits decreased range of motion, decreased strength, tenderness and bony tenderness.  Neurological: He is alert and oriented to person, place, and time. He exhibits normal muscle tone. Coordination normal.  Skin: Skin is warm and dry. Capillary refill takes less than 2 seconds. No rash noted. No erythema.  Psychiatric: He has a normal mood and affect. His behavior is normal.  Nursing note and vitals reviewed.    ED Treatments / Results  Labs (all labs ordered are listed, but only abnormal results are displayed) Labs Reviewed  SURGICAL PCR SCREEN - Abnormal; Notable for the following:       Result Value   Staphylococcus aureus POSITIVE (*)    All other components within normal limits  BASIC METABOLIC PANEL - Abnormal; Notable for the following:    Calcium 8.8 (*)    All other components within normal limits  BASIC METABOLIC PANEL - Abnormal; Notable for the following:    Glucose, Bld 150 (*)    All other components within normal limits  CBC - Abnormal; Notable for the following:    Hemoglobin 12.6 (*)    HCT 37.7 (*)    All other components within normal limits  GLUCOSE, CAPILLARY - Abnormal; Notable for the following:     Glucose-Capillary 100 (*)    All other components within normal limits  GLUCOSE, CAPILLARY - Abnormal; Notable for the following:    Glucose-Capillary 106 (*)    All other components within normal limits  BASIC METABOLIC PANEL - Abnormal; Notable for the following:    Glucose, Bld 127 (*)    All other components within normal limits  GLUCOSE, CAPILLARY - Abnormal; Notable for the following:    Glucose-Capillary 117 (*)    All other components within normal limits  BASIC METABOLIC PANEL - Abnormal; Notable for the following:    Glucose, Bld 145 (*)    All other components within normal limits  GLUCOSE, CAPILLARY - Abnormal; Notable for the following:    Glucose-Capillary 120 (*)    All other components within normal limits  CBC WITH DIFFERENTIAL/PLATELET  PROTIME-INR  CBC  CBC  TYPE AND SCREEN  ABO/RH    EKG  EKG Interpretation None       Radiology Pelvis Portable  Result Date: 08/07/2016 CLINICAL DATA:  Post RIGHT TOTAL HIP REVISION For Periprosthetic Right Femur Fracture. Proximal AP Femur imaged on Pelvis. Surgeon held for a frogleg lateral and did want/need a distal latera femur image since the first image had complete hardware. EXAM: PORTABLE PELVIS 1-2 VIEWS COMPARISON:  08/07/2016 FINDINGS: Remote left hip arthroplasty. Recent right hip revision. Femoral head component is well seated in the acetabular component based on the frontal view. No acute fracture. IMPRESSION: Status post right hip revision. Electronically Signed   By: Nolon Nations M.D.   On: 08/07/2016 13:27   Dg C-arm Gt 120 Min  Result Date: 08/07/2016 CLINICAL DATA:  Right hip revision EXAM: OPERATIVE RIGHT HIP WITH PELVIS; DG C-ARM GT 120 MIN COMPARISON:  08/04/2016 FLUOROSCOPY TIME:  Radiation Exposure Index (as provided by the fluoroscopic device): Not available If the device does not provide the exposure index: Fluoroscopy Time:  34 seconds Number of Acquired Images:  4 FINDINGS: New right hip  prosthesis is noted. No acute fracture is seen. Wires are noted surrounding the proximal femoral shaft. The fracture fragments previously seen are in near anatomic alignment. IMPRESSION: Status post right hip revision with proximal femoral wire placement Electronically Signed   By: Inez Catalina M.D.   On: 08/07/2016 11:53   Dg Hip Operative Unilat W Or W/o Pelvis Right  Result Date: 08/07/2016 CLINICAL DATA:  Right hip revision EXAM: OPERATIVE RIGHT HIP WITH PELVIS; DG C-ARM GT 120 MIN COMPARISON:  08/04/2016 FLUOROSCOPY TIME:  Radiation Exposure Index (as provided by the fluoroscopic device): Not available If the device does not provide the exposure index: Fluoroscopy Time:  34 seconds Number of Acquired Images:  4 FINDINGS: New right hip prosthesis is noted. No acute fracture is seen. Wires are noted surrounding the proximal femoral shaft. The fracture fragments previously seen are in near anatomic alignment. IMPRESSION: Status post right hip revision with proximal femoral wire placement Electronically Signed   By: Inez Catalina M.D.   On: 08/07/2016 11:53   Dg Femur Port, Min 2 Views Right  Result Date: 08/07/2016 CLINICAL DATA:  Post right hip revision. EXAM: RIGHT FEMUR PORTABLE 2 VIEW COMPARISON:  08/04/2016 FINDINGS: Interval revision of the right hip replacement with longer femoral shaft component and 3 cerclage wires across a nondisplaced midshaft right femoral fracture. Surgical drains in place. No hardware complicating feature. IMPRESSION: Right hip revision across the nondisplaced femoral shaft fracture. No complicating feature. Electronically Signed   By: Rolm Baptise M.D.   On: 08/07/2016 13:21    Procedures Procedures (including critical care time)  Medications Ordered in ED Medications  ceFAZolin (ANCEF) IVPB 2g/100 mL premix (2 g Intravenous Not Given 08/05/16 1600)  tranexamic acid (CYKLOKAPRON) 1,000 mg in sodium chloride 0.9 % 100 mL IVPB (1,000 mg Intravenous Not Given 08/05/16  1538)  acetaminophen (OFIRMEV) IV 1,000 mg (1,000 mg Intravenous Not Given 08/05/16 1600)  lisinopril (PRINIVIL,ZESTRIL) tablet 2.5 mg ( Oral MAR Unhold 08/07/16 1419)  pantoprazole (PROTONIX) EC tablet 40 mg ( Oral MAR Unhold 08/07/16 1419)  0.9 %  sodium chloride infusion ( Intravenous New Bag/Given 08/05/16 0807)  senna-docusate (Senokot-S) tablet 1 tablet ( Oral MAR Unhold 08/07/16 1419)  bisacodyl (DULCOLAX) EC tablet 5 mg ( Oral MAR Unhold 08/07/16 1419)  acetaminophen (TYLENOL) tablet 650 mg ( Oral MAR Unhold 08/07/16 1419)  morphine 2 MG/ML injection  2 mg ( Intravenous MAR Unhold 08/07/16 1419)  mupirocin ointment (BACTROBAN) 2 % 1 application ( Nasal MAR Unhold 08/07/16 1419)  Chlorhexidine Gluconate Cloth 2 % PADS 6 each ( Topical MAR Unhold 08/07/16 1419)  atorvastatin (LIPITOR) tablet 40 mg ( Oral MAR Unhold 08/07/16 1419)  HYDROcodone-acetaminophen (NORCO/VICODIN) 5-325 MG per tablet 1-2 tablet (2 tablets Oral Given 08/07/16 1438)  ondansetron (ZOFRAN) tablet 4 mg (not administered)    Or  ondansetron (ZOFRAN) injection 4 mg (not administered)  metoCLOPramide (REGLAN) tablet 5-10 mg (not administered)    Or  metoCLOPramide (REGLAN) injection 5-10 mg (not administered)  menthol-cetylpyridinium (CEPACOL) lozenge 3 mg (not administered)    Or  phenol (CHLORASEPTIC) mouth spray 1 spray (not administered)  ceFAZolin (ANCEF) IVPB 2g/100 mL premix (not administered)  apixaban (ELIQUIS) tablet 2.5 mg (not administered)  chlorhexidine (HIBICLENS) 4 % liquid 4 application (4 application Topical Given 08/05/16 0031)  heparin injection 5,000 Units (5,000 Units Subcutaneous Given 08/05/16 0028)  enoxaparin (LOVENOX) injection 40 mg (40 mg Subcutaneous Given 08/05/16 1703)     Initial Impression / Assessment and Plan / ED Course  I have reviewed the triage vital signs and the nursing notes.  Pertinent labs & imaging results that were available during my care of the patient were reviewed by me and  considered in my medical decision making (see chart for details).     I spoke with orthopedic surgery along with the [facility]. He will be admitted for periprosthetic fracture.  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

## 2016-08-07 NOTE — Progress Notes (Signed)
Orthopedic Tech Progress Note Patient Details:  Alan Hurst Dec 29, 1945 [id number]  Patient ID: [name], male   DOB: 1946-02-24, 71 y.o.   MRN: [id number]   [place] Pink 08/07/2016, 1:09 PMCalled [employer] for right Hip abduction brace.

## 2016-08-07 NOTE — Transfer of Care (Signed)
Immediate Anesthesia Transfer of Care Note  Patient: KHAYMAN KIRSCH  Procedure(s) Performed: Procedure(s): RIGHT TOTAL HIP REVISION (Right)  Patient Location: PACU  Anesthesia Type:General  Level of Consciousness: patient cooperative and responds to stimulation  Airway & Oxygen Therapy: Patient Spontanous Breathing and Patient connected to nasal cannula oxygen  Post-op Assessment: Report given to RN and Post -op Vital signs reviewed and stable  Post vital signs: Reviewed and stable  Last Vitals:  Vitals:   08/07/16 0600 08/07/16 1238  BP: 131/68   Pulse: 88   Resp: 16   Temp: 36.7 C (P) 36.5 C    Last Pain:  Vitals:   08/07/16 1238  TempSrc:   PainSc: (P) 0-No pain      Patients Stated Pain Goal: 2 (28/40/69 8614)  Complications: No apparent anesthesia complications

## 2016-08-07 NOTE — Brief Op Note (Signed)
08/07/2016  12:13 PM  PATIENT:  Alan Hurst  71 y.o. male  PRE-OPERATIVE DIAGNOSIS:  Periprosthetic Right Femur Fracture  POST-OPERATIVE DIAGNOSIS:  Periprosthetic Right Femur Fracture  PROCEDURE:  Procedure(s): TOTAL HIP REVISION (Right)  SURGEON:  Surgeon(s) and Role:    * Rod Can, MD - Primary  PHYSICIAN ASSISTANT: None  ASSISTANTS: Ky Barban, RNFA   ANESTHESIA:   general  EBL:  Total I/O In: 3800 [I.V.:3300; IV Piggyback:500] Out: 76 [Urine:460; Blood:400]  BLOOD ADMINISTERED:none  DRAINS: (1) Hemovact drain(s) in the right hip with  Suction Open   LOCAL MEDICATIONS USED:  NONE  SPECIMEN:  No Specimen  DISPOSITION OF SPECIMEN:  PATHOLOGY  COUNTS:  YES  TOURNIQUET:  * No tourniquets in log *  DICTATION: .Other Dictation: Dictation Number [id number]  PLAN OF CARE: Admit to inpatient   PATIENT DISPOSITION:  PACU - hemodynamically stable.   Delay start of Pharmacological VTE agent (>24hrs) due to surgical blood loss or risk of bleeding: no

## 2016-08-07 NOTE — Interval H&P Note (Signed)
History and Physical Interval Note:  08/07/2016 7:43 AM  Alan Hurst  has presented today for surgery, with the diagnosis of Periprosthetic Right Femur Fracture  The various methods of treatment have been discussed with the patient and family. After consideration of risks, benefits and other options for treatment, the patient has consented to  Procedure(s): TOTAL HIP REVISION (Right) as a surgical intervention .  The patient's history has been reviewed, patient examined, no change in status, stable for surgery.  I have reviewed the patient's chart and labs.  Questions were answered to the patient's satisfaction.     The risks, benefits, and alternatives were discussed with the patient. There are risks associated with the surgery including, but not limited to, problems with anesthesia (death), infection, instability (giving out of the joint), dislocation, differences in leg length/angulation/rotation, fracture of bones, malunion, nonunion, loosening or failure of implants, hematoma (blood accumulation) which may require surgical drainage, blood clots, pulmonary embolism, nerve injury (foot drop and lateral thigh numbness), and blood vessel injury. The patient understands these risks and elects to proceed.   Alan Hurst, Alan Hurst

## 2016-08-07 NOTE — H&P (View-Only) (Signed)
ORTHOPAEDIC CONSULTATION  REQUESTING PHYSICIAN: [name], MD  PCP:  [name], DO  Chief Complaint: Right periprosthetic femur fracture  HPI: Alan Hurst is a 71 y.o. male who complains of right hip pain. 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 Amada Jupiter in 1995. He tripped and fell at home on 08/03/2016, and he landed on his right hip. He had difficulty weightbearing. He came to the hospital yesterday, where x-rays and a CT scan revealed a right [place] B2 periprosthetic femur fracture. 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 He was admitted to the hospitalist service for perioperative risk stratification and medical optimization. Due to the complexity of this injury, I was asked to see the patient by Dr. [name].  Past Medical History:  Diagnosis Date  . GERD (gastroesophageal reflux disease) 12/05/2015   Well controlled on meds  . HLD (hyperlipidemia) 12/05/2015   10+ yrs at least.   . 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 Vitamin B deficiency 12/05/2015  . Vitamin D insufficiency 12/05/2015   Past Surgical History:  Procedure Laterality Date  . JOINT REPLACEMENT Bilateral    hips   Social History   Social History  . Marital status: Married    Spouse name: N/A  . Number of children: N/A  . Years of education: N/A   Social History Main Topics  . Smoking status: Former Smoker    Quit date: 05/11/1993  . Smokeless tobacco: Never Used  . Alcohol use No  . Drug use: No  . Sexual activity: Yes    Birth control/ protection: None   Other Topics Concern  . None   Social History Narrative  . None   Family History  Problem Relation Age of Onset  . Alcohol abuse Mother   . Alcohol abuse Father   . Congestive Heart Failure Father   . Kidney disease Father    No Known Allergies Prior to Admission medications   Medication Sig Start Date End  Date Taking? Authorizing Provider  aspirin EC 81 MG tablet Take 1 tablet by mouth daily. 04/25/13  Yes Historical Provider, MD  B Complex Vitamins (VITAMIN-B COMPLEX) TABS Take 1 tablet by mouth daily.   Yes Historical Provider, MD  Cholecalciferol (VITAMIN D) 2000 units tablet Take 1 tablet by mouth daily. 04/25/13  Yes Historical Provider, MD  lisinopril (PRINIVIL,ZESTRIL) 2.5 MG tablet TAKE 1 TABLET BY MOUTH  DAILY 05/25/16  Yes [name], DO  lovastatin (MEVACOR) 40 MG tablet TAKE 1 TABLET BY MOUTH  DAILY 05/25/16  Yes [name], DO  magnesium oxide (MAG-OX) 400 MG tablet Take 400 mg by mouth daily.   Yes Historical Provider, MD  Melatonin 1 MG TABS Take 3 tablets by mouth at bedtime.   Yes Historical Provider, MD  metFORMIN (GLUCOPHAGE) 500 MG tablet TAKE 1 TABLET BY MOUTH  DAILY 05/25/16  Yes [name], DO  Multiple Vitamin (MULTIVITAMIN) tablet Take 1 tablet by mouth daily.   Yes Historical Provider, MD  Omega-3 Fatty Acids (FISH OIL) 1000 MG CAPS Take 1 capsule by mouth daily. 04/25/13  Yes Historical Provider, MD  omeprazole (PRILOSEC) 20 MG capsule TAKE 1 CAPSULE BY MOUTH  DAILY 05/25/16  Yes [name], DO   Ct Hip Right Wo Contrast  Result Date: 08/04/2016 CLINICAL DATA:  Periprostatic fracture of the right hip. EXAM: CT OF THE RIGHT HIP WITHOUT CONTRAST TECHNIQUE: Multidetector CT imaging  of the right hip was performed according to the standard protocol. Multiplanar CT image reconstructions were also generated. COMPARISON:  Same day radiographs of the right hip FINDINGS: Bones/Joint/Cartilage There is an acute, closed periprosthetic fracture of the right femur. A sagittal oblique fracture is noted through the proximal third of the femoral shaft extending from greater trochanter (just lateral to the femoral stem) and converging with a horizontal oblique fracture near the junction of the proximal middle third of the femoral shaft, exiting laterally. Slight medial  displacement of the distal fracture fragment by 3 mm with 4 mm of anterior displacement is noted at this level. The horizontal oblique fracture is seen approximately 2.5 cm from the tip of the femoral stem. Small posterior butterfly fragment noted posteriorly just proximal to the horizontal oblique fracture, series 7, image 46. Ligaments Suboptimally assessed by CT. Muscles and Tendons No intramuscular hematoma or acute hemorrhage. Soft tissues No focal fluid collection. Mild eccentric positioning of the femoral head prosthetic within the acetabular component suggests asymmetric lining wear. IMPRESSION: 1. Acute, comminuted, closed periprosthetic fracture of the right femur along side the noncemented femoral stem of a total hip arthroplasty. A horizontal oblique fracture component is seen exiting roughly 2.5 cm from the tip of the uncemented femoral stem with minimal medial and anterior displacement of the distal fracture fragment. The larger sagittal oblique fracture is noted from the level of the greater trochanter just lateral to the femoral component converging with a horizontal oblique fracture distally. 2. Asymmetric positioning of the femoral head within the acetabular component suggest presence of asymmetric lining wear. Electronically Signed   By: [name] M.D.   On: 08/04/2016 19:09   Dg Hip Unilat With Pelvis 2-3 Views Right  Result Date: 08/04/2016 CLINICAL DATA:  [place] last night landing on the right hip with pain and limited mobility EXAM: DG HIP (WITH OR WITHOUT PELVIS) 2-3V RIGHT COMPARISON:  None. FINDINGS: There is an oblique fracture of the proximal left femur several cms below the lesser trochanter. In addition there is a nondisplaced fracture of the lateral proximal right femur near the stem of the femoral component. It is noted that the femoral component head is not centered within the right acetabular cup. This could be due to thinning of the plastic insert over time, with an acute  process less likely. The left hip replacement is unremarkable. The bones are osteopenic. The pelvic rami are intact. The SI joints are corticated. IMPRESSION: 1. Oblique fracture of the proximal left subtrochanteric femur. 2. Transverse fracture of the proximal right femur near the femoral stem laterally. 3. Asymmetrical position of the head of the femoral component relative to the acetabular cup of questionable clinical significance. Electronically Signed   By: Ivar Drape M.D.   On: 08/04/2016 14:55   Dg Femur, Min 2 Views Right  Result Date: 08/04/2016 CLINICAL DATA:  Patient slipped and fell. Fracture of the right femur. EXAM: RIGHT FEMUR 2 VIEWS COMPARISON:  None. FINDINGS: Acute, comminuted closed fracture of the proximal femoral shaft surrounding an uncemented femoral stem component of the total hip arthroplasty. A horizontal component is seen laterally approximately 2.5 cm from the tip of the femoral stem. An oblique fracture seen more proximally along the medial aspect. No significant displacement. Asymmetric positioning of the femoral head prosthetic within the acetabular component suggests asymmetric lining wear. IMPRESSION: 1. Acute, closed periprosthetic comminuted fracture of the right femur as above. 2. Asymmetric lining wear suggested given the non centered appearance of the femoral  head within the acetabular component. Electronically Signed   By: [name] M.D.   On: 08/04/2016 19:13    Positive ROS: All other systems have been reviewed and were otherwise negative with the exception of those mentioned in the HPI and as above.  Physical Exam: General: Alert, no acute distress Cardiovascular: No pedal edema Respiratory: No cyanosis, no use of accessory musculature GI: No organomegaly, abdomen is soft and non-tender Skin: No lesions in the area of chief complaint Neurologic: Sensation intact distally Psychiatric: Patient is competent for consent with normal mood and affect Lymphatic:  No axillary or cervical lymphadenopathy  MUSCULOSKELETAL: Examination of the right hip reveals a healed posterior incision. He has pain with attempted logrolling of the hip. He is neurovascularly intact distally.  Assessment: Right [place] B2 periprosthetic femur fracture with polyliner wear  Plan: I discussed the situation with the patient. He has a failed right total hip arthroplasty secondary to femur fracture and loose femoral component. He is going to require revision right total hip arthroplasty. He will also need a polyliner exchange versus revision of the acetabular component. We discussed the risks, benefits, and alternatives. We will tentatively plan for surgery today, however due to implant availability, we may need to reschedule him for Friday morning. Continue nothing by mouth for now. Hold chemical DVT prophylaxis for now.   The risks, benefits, and alternatives were discussed with the patient. There are risks associated with the surgery including, but not limited to, problems with anesthesia (death), infection, instability (giving out of the joint), dislocation, differences in leg length/angulation/rotation, fracture of bones, loosening or failure of implants, malunion, nonunion, hematoma (blood accumulation) which may require surgical drainage, blood clots, pulmonary embolism, nerve injury (foot drop and lateral thigh numbness), and blood vessel injury. The patient understands these risks and elects to proceed.  Mikaella Escalona, Horald Pollen, MD Cell [phone]    08/05/2016 8:12 AM

## 2016-08-07 NOTE — Anesthesia Procedure Notes (Addendum)
Procedure Name: Intubation Date/Time: 08/07/2016 8:13 AM Performed by: Jacquiline Doe A Pre-anesthesia Checklist: Patient identified, Emergency Drugs available, Suction available and Patient being monitored Patient Re-evaluated:Patient Re-evaluated prior to inductionOxygen Delivery Method: Circle System Utilized and Circle system utilized Preoxygenation: Pre-oxygenation with 100% oxygen Intubation Type: IV induction Ventilation: Mask ventilation without difficulty and Oral airway inserted - appropriate to patient size Laryngoscope Size: Mac and 3 Grade View: Grade I Tube type: Oral Tube size: 7.5 mm Number of attempts: 1 Airway Equipment and Method: Stylet and Oral airway Placement Confirmation: ETT inserted through vocal cords under direct vision,  positive ETCO2 and breath sounds checked- equal and bilateral Secured at: 23 cm Tube secured with: Tape Dental Injury: Teeth and Oropharynx as per pre-operative assessment

## 2016-08-07 NOTE — Progress Notes (Signed)
FPTS Interim Progress Note  Visited patient in room post-op as he was in the OR this AM.  Wife and family are at bedside.  He appears comfortable and pain well controlled on Vicodin Q6 PRN and IV Morphine 2g Q4 PRN.  Addressed questions with family and no concerns at this time.  Will continue to monitor throughout the day.   BP 124/74 (BP Location: Right Arm)   Pulse (!) 113   Temp 98.2 F (36.8 C) (Oral)   Resp 14   Ht '5\' 9"'$  (1.753 m)   Wt 200 lb (90.7 kg)   SpO2 96%   BMI 29.53 kg/m    Physical exam:  General: pleasant 71 y/o male laying in bed, NAD    Eyes: EOMI, PERRL  ENTM: MMM. o/p clear, Cook in place  Neck: Supple  Cardiovascular: RRR, no MRG noted, palpable pulses Respiratory: CTA B/L with comfortable work of breathing on 2L O2 Gastrointestinal: soft, NT, ND, +BS MSK: Dressing over surgical site, SCDs in place bilaterally  Derm: Skin is warm and dry  Neuro: AAOx3, Sensation to LE grossly intact. Able to wiggle toes bilaterally.  Psych: Appropriate mood and affect.   Lovenia Kim, MD 08/07/2016, 4:48 PM PGY-1, [facility] Service pager [phone]

## 2016-08-07 NOTE — Progress Notes (Signed)
Family Medicine Teaching Service Daily Progress Note Intern Pager: [phone]  Patient name: Alan Hurst Medical record number: [id number] Date of birth: 03-Jun-1945 Age: 71 y.o. Gender: male  Primary Care Provider: [name], DO Consultants: Ortho  Code Status: FULL  Pt Overview and Major Events to Date:  Admit 3/28  Assessment and Plan: Alan Hurst is a 71 y.o. male presenting with hip pain s/p fall. 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   Right Periprosthetic Femur Fracture: S/p mechanical fall.  CT right hip revealed acute, comminuted, closed periprosthetic fracture of the right femur along side the noncemented femoral stem of a total hip arthroplasty. A horizontal oblique fracture component was seen exiting roughly 2.5 cm from the tip of the uncemented femoral stem with minimal medial and anterior displacement of the distal fracture fragment.  -pre-op pain control: tylenol 650 mg q6h prn and morphine 2 mg q4h prn .  Patient has not required any morphine.  -Currently in OR for orthopedic surgery this AM, Dr. Lyla Glassing  -Will follow up ortho post-op recs -PT/OT consult   HLD: Last lipid panel Jan 2018: Cholesterol 160, HDL 51, LDL 81, Triglycerides 140. On Lovastatin 40 mg and ASA 81 mg daily at home.   -ASCVD risk calculated to be >7.5%.  Discontinued lovastatin and started on high intensity statin Lipitor 40 mg daily 3/28.   -hold ASA for surgery   T2DM: Well controlled with recent A1c 5.9 in Jan 2018. On Metformin 500 mg daily.  -CBG daily  -hold metformin  -will order SSI if necessary in setting of stress during hospitalization   CKD: Cr 1.12 this morning. Baseline Cr appears to be 1.2. Likely related to h/o T2DM.  -monitor BMET -avoid nephrotoxic agents   FEN/GI: NPO,  Protonix  Prophylaxis:  hold for OR   Disposition: Surgery today. Dispo pending clinical improvement, PT/OT to determine home vs SNF.    Subjective:  Patient in OR this morning so did not see. Will check on him after surgery.    Objective: Temp:  [98 F (36.7 C)-98.4 F (36.9 C)] 98 F (36.7 C) (03/30 0600) Pulse Rate:  [85-91] 88 (03/30 0600) Resp:  [16] 16 (03/30 0600) BP: (121-131)/(68-78) 131/68 (03/30 0600) SpO2:  [94 %-97 %] 94 % (03/30 0600)   Physical Exam: Please see interim progress note later today.  Patient not in room this morning.   Laboratory:  Recent Labs Lab 08/05/16 0237 08/06/16 0804 08/07/16 0508  WBC 7.1 8.4 7.4  HGB 12.6* 13.7 13.4  HCT 37.7* 41.1 40.0  PLT 235 253 257    Recent Labs Lab 08/05/16 0237 08/06/16 0804 08/07/16 0508  NA 137 138 138  K 3.8 4.2 3.9  CL 104 105 104  CO2 '25 24 24  '$ BUN '10 12 15  '$ CREATININE 1.07 1.10 1.12  CALCIUM 8.9 9.2 9.1  GLUCOSE 150* 127* 145*   Imaging/Diagnostic Tests:   Lovenia Kim, MD 08/07/2016, 10:23 AM PGY-1, [place] Intern pager: [phone], text pages welcome

## 2016-08-07 NOTE — Discharge Instructions (Signed)
Dr. Rod Can Joint Replacement Specialist [facility] [address], [place], [place] [address] [phone]   TOTAL HIP REPLACEMENT POSTOPERATIVE DIRECTIONS    Hip Rehabilitation, Guidelines Following Surgery   WEIGHT BEARING Partial weight bearing with assist device as directed.  Touch Down Weight Bearing Right Leg with walker  The results of a hip operation are greatly improved after range of motion and muscle strengthening exercises. Follow all safety measures which are given to protect your hip. If any of these exercises cause increased pain or swelling in your joint, decrease the amount until you are comfortable again. Then slowly increase the exercises. Call your caregiver if you have problems or questions.   HOME CARE INSTRUCTIONS  Most of the following instructions are designed to prevent the dislocation of your new hip.  Remove items at home which could result in a fall. This includes throw rugs or furniture in walking pathways.  Continue medications as instructed at time of discharge.  You may have some home medications which will be placed on hold until you complete the course of blood thinner medication.  You may start showering once you are discharged home. Do not remove your dressing. Do not put on socks or shoes without following the instructions of your caregivers.   Sit on chairs with arms. Use the chair arms to help push yourself up when arising.  Arrange for the use of a toilet seat elevator so you are not sitting low.   Walk with walker as instructed.  You may resume a sexual relationship in one month or when given the OK by your caregiver.  Use walker as long as suggested by your caregivers.  Avoid periods of inactivity such as sitting longer than an hour when not asleep. This helps prevent blood clots.  You may return to work once you are cleared by Engineer, production.  Do not drive a car for 6 weeks or until released by your  surgeon.  Do not drive while taking narcotics.  Wear elastic stockings for two weeks following surgery during the day but you may remove then at night.  Make sure you keep all of your appointments after your operation with all of your doctors and caregivers. You should call the office at the above phone number and make an appointment for approximately two weeks after the date of your surgery. Please pick up a stool softener and laxative for home use as long as you are requiring pain medications.  ICE to the affected hip every three hours for 30 minutes at a time and then as needed for pain and swelling. Continue to use ice on the hip for pain and swelling from surgery. You may notice swelling that will progress down to the foot and ankle.  This is normal after surgery.  Elevate the leg when you are not up walking on it.   It is important for you to complete the blood thinner medication as prescribed by your doctor.  Continue to use the breathing machine which will help keep your temperature down.  It is common for your temperature to cycle up and down following surgery, especially at night when you are not up moving around and exerting yourself.  The breathing machine keeps your lungs expanded and your temperature down.  RANGE OF MOTION AND STRENGTHENING EXERCISES  These exercises are designed to help you keep full movement of your hip joint. Follow your caregiver's or physical therapist's instructions. Perform all exercises about fifteen times, three times  per day or as directed. Exercise both hips, even if you have had only one joint replacement. These exercises can be done on a training (exercise) mat, on the floor, on a table or on a bed. Use whatever works the best and is most comfortable for you. Use music or television while you are exercising so that the exercises are a pleasant break in your day. This will make your life better with the exercises acting as a break in routine you can look forward  to.  Lying on your back, slowly slide your foot toward your buttocks, raising your knee up off the floor. Then slowly slide your foot back down until your leg is straight again.  Lying on your back spread your legs as far apart as you can without causing discomfort.  Lying on your side, raise your upper leg and foot straight up from the floor as far as is comfortable. Slowly lower the leg and repeat.  Lying on your back, tighten up the muscle in the front of your thigh (quadriceps muscles). You can do this by keeping your leg straight and trying to raise your heel off the floor. This helps strengthen the largest muscle supporting your knee.  Lying on your back, tighten up the muscles of your buttocks both with the legs straight and with the knee bent at a comfortable angle while keeping your heel on the floor.   SKILLED REHAB INSTRUCTIONS: If the patient is transferred to a skilled rehab facility following release from the hospital, a list of the current medications will be sent to the facility for the patient to continue.  When discharged from the skilled rehab facility, please have the facility set up the patient's [place] prior to being released. Also, the skilled facility will be responsible for providing the patient with their medications at time of release from the facility to include their pain medication and their blood thinner medication. If the patient is still at the rehab facility at time of the two week follow up appointment, the skilled rehab facility will also need to assist the patient in arranging follow up appointment in our office and any transportation needs.  MAKE SURE YOU:  Understand these instructions.  Will watch your condition.  Will get help right away if you are not doing well or get worse.  Pick up stool softner and laxative for home use following surgery while on pain medications. Do not remove your dressing. The dressing is waterproof--it is OK to  take showers. Continue to use ice for pain and swelling after surgery. Do not use any lotions or creams on the incision until instructed by your surgeon. Total Hip Protocol. POSTERIOR HIP PRECAUTIONS. WEAR HIP ABDUCTION BRACE AT ALL TIMES FOR 6 WEEKS. You may remove it for showering.   Information on my medicine - ELIQUIS (apixaban)  Why was Eliquis prescribed for you? Eliquis was prescribed for you to reduce the risk of blood clots forming after orthopedic surgery.    What do You need to know about Eliquis? Take your Eliquis TWICE DAILY - one tablet in the morning and one tablet in the evening with or without food.  It would be best to take the dose about the same time each day.  If you have difficulty swallowing the tablet whole please discuss with your pharmacist how to take the medication safely.  Take Eliquis exactly as prescribed by your doctor and DO NOT stop taking Eliquis without talking to the doctor who  prescribed the medication.  Stopping without other medication to take the place of Eliquis may increase your risk of developing a clot.  After discharge, you should have regular check-up appointments with your healthcare provider that is prescribing your Eliquis.  What do you do if you miss a dose? If a dose of ELIQUIS is not taken at the scheduled time, take it as soon as possible on the same day and twice-daily administration should be resumed.  The dose should not be doubled to make up for a missed dose.  Do not take more than one tablet of ELIQUIS at the same time.  Important Safety Information A possible side effect of Eliquis is bleeding. You should call your healthcare provider right away if you experience any of the following: ? Bleeding from an injury or your nose that does not stop. ? Unusual colored urine (red or dark brown) or unusual colored stools (red or black). ? Unusual bruising for unknown reasons. ? A serious fall or if you hit your head (even if  there is no bleeding).  Some medicines may interact with Eliquis and might increase your risk of bleeding or clotting while on Eliquis. To help avoid this, consult your healthcare provider or pharmacist prior to using any new prescription or non-prescription medications, including herbals, vitamins, non-steroidal anti-inflammatory drugs (NSAIDs) and supplements.  This website has more information on Eliquis (apixaban): http://www.eliquis.com/eliquis/home

## 2016-08-08 LAB — BASIC METABOLIC PANEL
ANION GAP: 7 (ref 5–15)
BUN: 14 mg/dL (ref 6–20)
CHLORIDE: 103 mmol/L (ref 101–111)
CO2: 27 mmol/L (ref 22–32)
Calcium: 8.4 mg/dL — ABNORMAL LOW (ref 8.9–10.3)
Creatinine, Ser: 1.23 mg/dL (ref 0.61–1.24)
GFR calc Af Amer: >60 mL/min (ref >60)
GFR calc non Af Amer: 58 mL/min — ABNORMAL LOW (ref >60)
GLUCOSE: 166 mg/dL — AB (ref 65–99)
POTASSIUM: 4.7 mmol/L (ref 3.5–5.1)
Sodium: 137 mmol/L (ref 135–145)

## 2016-08-08 LAB — CBC
HEMATOCRIT: 28.8 % — AB (ref 39.0–52.0)
HEMOGLOBIN: 9.5 g/dL — AB (ref 13.0–17.0)
MCH: 26.8 pg (ref 26.0–34.0)
MCHC: 33 g/dL (ref 30.0–36.0)
MCV: 81.4 fL (ref 78.0–100.0)
Platelets: 247 10*3/uL (ref 150–400)
RBC: 3.54 MIL/uL — AB (ref 4.22–5.81)
RDW: 14.4 % (ref 11.5–15.5)
WBC: 12 10*3/uL — AB (ref 4.0–10.5)

## 2016-08-08 LAB — GLUCOSE, CAPILLARY: Glucose-Capillary: 120 mg/dL — ABNORMAL HIGH (ref 65–99)

## 2016-08-08 NOTE — Progress Notes (Signed)
Family Medicine Teaching Service Daily Progress Note Intern Pager: [phone]  Patient name: Alan Hurst Medical record number: [id number] Date of birth: 1945-07-05 Age: 71 y.o. Gender: male  Primary Care Provider: [name], DO Consultants: Ortho  Code Status: FULL  Pt Overview and Major Events to Date:  -Admit 3/28 -Total hip revision 3/30 with Dr. Lyla Glassing  Assessment and Plan: Alan Hurst is a 71 y.o. male presenting with hip pain s/p fall, found to have a periprosthetic femur fracture. 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   Right Periprosthetic Femur Fracture: POD #1 from a total hip revision on 3/31. Required morphine x 1 overnight. -Ortho following, appreciate recommendations -Norco 1-2 tablets q6hrs prn and morphine '2mg'$  q4hrs prn for pain. -Zofran and Reglan prn for nausea -PT/OT   HLD: Last lipid panel Jan 2018: Cholesterol 160, HDL 51, LDL 81, Triglycerides 140. On Lovastatin 40 mg and ASA 81 mg daily at home.   -Switch to Lipitor 40 mg daily -Restart home Aspirin '81mg'$  today (previously holding for surgery)  T2DM: Well controlled with recent A1c 5.9 in Jan 2018. On Metformin 500 mg daily.  -CBG daily  -hold metformin while hospitalized  CKD: Cr 1.12 > 1.23. Baseline Cr appears to be 1.2. Likely related to h/o T2DM.  -monitor BMET -avoid nephrotoxic agents   FEN/GI: Carb-modified diet, Protonix  Prophylaxis: Eliquis  Disposition: Pending clinical improvement and PT/OT evals today. Pt would like to return home, as he is the sole caregiver for his wife.  Subjective:  Pt states he is doing fine today. Has a good appetite. Pain is moderately well controlled. He states he needed a dose of morphine overnight, but his pain is a little better this morning. States he is using the incentive spirometry regularly.  Objective: Temp:  [97.7 F (36.5 C)-98.2 F (36.8 C)] 98 F (36.7 C) (03/31 0415) Pulse  Rate:  [108-125] 108 (03/31 0415) Resp:  [11-19] 14 (03/30 1427) BP: (95-127)/(55-77) 111/56 (03/31 0415) SpO2:  [90 %-97 %] 94 % (03/31 0415)   Physical Exam: General: elderly male sitting up in bed eating breakfast, in NAD, pleasant HEENT: EOMI, MMM Cardiovascular: RRR, no murmurs, 2+ DP pulses bilaterally Respiratory: CTAB, [place] in place Gastrointestinal: soft, NT, ND, +BS MSK: Hip brace in place Neuro: AAOx3, sensation intact to light touch in the distal extremities  Laboratory:  Recent Labs Lab 08/06/16 0804 08/07/16 0508 08/08/16 0313  WBC 8.4 7.4 12.0*  HGB 13.7 13.4 9.5*  HCT 41.1 40.0 28.8*  PLT 253 257 247    Recent Labs Lab 08/06/16 0804 08/07/16 0508 08/08/16 0313  NA 138 138 137  K 4.2 3.9 4.7  CL 105 104 103  CO2 '24 24 27  '$ BUN '12 15 14  '$ CREATININE 1.10 1.12 1.23  CALCIUM 9.2 9.1 8.4*  GLUCOSE 127* 145* 166*   Imaging/Diagnostic Tests: CT right hip- acute, comminuted, closed periprosthetic fracture of the right femur along side the noncemented femoral stem of a total hip arthroplasty. A horizontal oblique fracture component wasseen exiting roughly 2.5 cm from the tip of the uncemented femoral stem with minimal medial and anterior displacement of the distal fracture fragment.   Sela Hua, MD 08/08/2016, 11:02 AM PGY-2, [place] Intern pager: [phone], text pages welcome

## 2016-08-08 NOTE — Care Management Note (Signed)
71 yo M sustained a fall at home while trying to break up a fight between his cat and dog. He sustained a R hip fx at his previous prosthesis which required a revision to his THA.  Received referral to assist pt with HHPT and DME.  Met with pt at bedside. He plans to return home with the support of his brother and friends. He is the primary caregiver for his wife who has MS. She has private duty care while he is recovering. He reports a good support system. He has a shower bench and a 3-in-1 BSC.  PT is recommending a RW and HHPT. Asked pt if he has a preference for a [facility] agency. They used [facility] in the past and he prefers to use them again.  Contacted Jamaine and Reggie at [facility] for referrals.

## 2016-08-08 NOTE — Evaluation (Addendum)
Occupational Therapy Evaluation Patient Details Name: Alan Hurst MRN: [id number] DOB: 01-Dec-1945 Today's Date: 08/08/2016    History of Present Illness Pt is a 71 yo male admitted through ED following a fall at home on 08/03/16. Pt was trying to break up a fight between his cat and dog. Pt suffered a R hip fx at his previous prosthesis which required a Revision to his THA on 08/07/16. PMH significant for HTN, DM2, HLD, B [place], recovering alcoholic.    Clinical Impression   PTA, pt was independent with ADL and functional mobility and was the primary caregiver for his wife who has MS. Pt currently requires maximum assistance with LB ADL while adhering to posterior hip precautions and moderate assistance with toilet transfers. Pt would benefit from continued OT services while admitted to improve independence with ADL and functional mobility. Feel pt would benefit from short-term SNF placement for continued rehabilitation but pt politely declines this as he reports that his wife does better when he is at home. As such, pt will require home health OT services with home health aide post-acute D/C to maximize safety and independence with ADL in the home environment. Will continue to follow acutely.  Of note, following ambulation for toilet transfer, pt's HR 138. After rest and assistance back to bed, HR 128. Notified RN who is monitoring.    Follow Up Recommendations  Home health OT;Supervision/Assistance - 24 hour; [place] aide   Equipment Recommendations  None recommended by OT    Recommendations for Other Services       Precautions / Restrictions Precautions Precautions: Posterior Hip Precaution Booklet Issued: No Precaution Comments: Reviewed precautions verbally Required Braces or Orthoses: Other Brace/Splint Other Brace/Splint: abductor brace Restrictions Weight Bearing Restrictions: Yes RLE Weight Bearing: Partial weight bearing (TDWB also listed) RLE Partial Weight  Bearing Percentage or Pounds: 30      Mobility Bed Mobility Overal bed mobility: Needs Assistance Bed Mobility: Sit to Supine     Supine to sit: Mod assist Sit to supine: Min assist   General bed mobility comments: Min assist to raise R LE into bed.  Transfers Overall transfer level: Needs assistance Equipment used: Rolling walker (2 wheeled) Transfers: Sit to/from [facility] Sit to Stand: Mod assist;Min assist Stand pivot transfers: Min assist       General transfer comment: Mod assist for initial sit<>stand from lower surface; min assist from raised bed.    Balance Overall balance assessment: Needs assistance Sitting-balance support: Single extremity supported;Feet supported Sitting balance-Leahy Scale: Fair Sitting balance - Comments: leaning to left and using rail for support Postural control: Left lateral lean Standing balance support: Bilateral upper extremity supported;During functional activity Standing balance-Leahy Scale: Poor Standing balance comment: Relies on RW for stability                           ADL either performed or assessed with clinical judgement   ADL Overall ADL's : Needs assistance/impaired Eating/Feeding: Set up;Sitting   Grooming: Set up;Sitting   Upper Body Bathing: Set up;Sitting   Lower Body Bathing: Maximal assistance;Sit to/from stand   Upper Body Dressing : Set up;Sitting   Lower Body Dressing: Maximal assistance;Sit to/from stand   Toilet Transfer: Moderate assistance;Ambulation;RW;BSC Toilet Transfer Details (indicate cue type and reason): Simulated with sit<>stand followed by ambulation. Pt fatigues easily and required therapeutic rest breaks. Toileting- Clothing Manipulation and Hygiene: Minimal assistance;Sit to/from stand  Functional mobility during ADLs: Minimal assistance;Moderate assistance;Rolling walker General ADL Comments: As pt fatigued, he required increased assistance with  functional mobility during toilet transfers. Educated on posterior hip precautions and brace wear.     Vision Patient Visual Report: No change from baseline Vision Assessment?: No apparent visual deficits     Perception     Praxis      Pertinent Vitals/Pain Pain Assessment: Faces Faces Pain Scale: Hurts even more Pain Location: right hip Pain Descriptors / Indicators: Aching;Grimacing;Guarding Pain Intervention(s): Limited activity within patient's tolerance;Monitored during session;Repositioned     Hand Dominance Left   Extremity/Trunk Assessment Upper Extremity Assessment Upper Extremity Assessment: Overall WFL for tasks assessed   Lower Extremity Assessment Lower Extremity Assessment: RLE deficits/detail RLE Deficits / Details: Pt with post-op pain and weakness. RLE: Unable to fully assess due to immobilization       Communication Communication Communication: No difficulties   Cognition Arousal/Alertness: Awake/alert Behavior During Therapy: WFL for tasks assessed/performed Overall Cognitive Status: Within Functional Limits for tasks assessed                                     General Comments       Exercises Exercises: Total Joint Total Joint Exercises Ankle Circles/Pumps: AROM;Both;20 reps;Supine Quad Sets: AROM;Both;10 reps;Supine   Shoulder Instructions      Home Living Family/patient expects to be discharged to:: Private residence Living Arrangements: Spouse/significant other Available Help at Discharge: Family;Friend(s);Personal care attendant;Available 24 hours/day;Available PRN/intermittently Type of Home: House Home Access: Ramped entrance     Home Layout: One level     Bathroom Shower/Tub: Occupational psychologist: Standard     Home Equipment: Bedside commode;Shower seat;Other (comment);Grab bars - toilet;Grab bars - tub/shower;Hand held shower head (hoyer lift, trapeze bar)          Prior  Functioning/Environment Level of Independence: Independent        Comments: was completely and full time caregiver for his wife prior to admission        OT Problem List: Decreased strength;Decreased range of motion;Decreased activity tolerance;Impaired balance (sitting and/or standing);Decreased safety awareness;Decreased knowledge of use of DME or AE;Decreased knowledge of precautions;Pain      OT Treatment/Interventions: Self-care/ADL training;Therapeutic exercise;Energy conservation;Therapeutic activities;Patient/family education;Balance training    OT Goals(Current goals can be found in the care plan section) Acute Rehab OT Goals Patient Stated Goal: to get home to his wife  OT Goal Formulation: With patient Time For Goal Achievement: 08/22/16 Potential to Achieve Goals: Good ADL Goals Pt Will Perform Lower Body Bathing: with modified independence;with adaptive equipment;sit to/from stand Pt Will Perform Lower Body Dressing: with modified independence;with adaptive equipment;sit to/from stand Pt Will Transfer to Toilet: with modified independence;ambulating;bedside commode (BSC over toilet) Pt Will Perform Toileting - Clothing Manipulation and hygiene: with modified independence;sit to/from stand Pt Will Perform Tub/Shower Transfer: Shower transfer;with modified independence;3 in 1;rolling walker;ambulating  OT Frequency: Min 2X/week   Barriers to D/C:            Co-evaluation              End of Session Equipment Utilized During Treatment: Gait belt;Rolling walker (Abductor brace)  Activity Tolerance: Patient tolerated treatment well Patient left: in bed;with call bell/phone within reach;with bed alarm set  OT Visit Diagnosis: Pain;Other abnormalities of gait and mobility (R26.89) Pain - Right/Left: Right Pain - part of body: Hip  Time: 1325-1400 OT Time Calculation (min): 35 min Charges:  OT General Charges $OT Visit: 1 Procedure OT  Evaluation $OT Eval Moderate Complexity: 1 Procedure OT Treatments $Self Care/Home Management : 8-22 mins G-Codes:     Norman Herrlich, MS OTR/L  Pager: [place] A Dorothye Berni 08/08/2016, 3:12 PM

## 2016-08-08 NOTE — Evaluation (Signed)
Physical Therapy Evaluation Patient Details Name: Alan Hurst MRN: [id number] DOB: July 22, 1945 Today's Date: 08/08/2016   History of Present Illness  Pt is a 71 yo male admitted through ED following a fall at home on 08/03/16. Pt was trying to break up a fight between his cat and dog. Pt suffered a R hip fx at his previous prosthesis which required a Revision to his THA on 08/07/16. PMH significant for HTN, DM2, HLD, B [place], recovering alcoholic.   Clinical Impression  Pt is POD 1 following the above procedure. Prior to admission, pt was living with his wife in a single level home where pt is her PCG. Pt's wife has MS. Currently, pt has help from friends, family and paid help to assist with his wife and himself once he returns home. Pt would like to defer a rehab stay and go home with HHPT services as his wife does better when he is home. Pt requires Min to Mod A for mobility this session but anticipate this will improve during acute stay prior to discharge. Pt will require continued acute PT services in order to address the below deficits to assist with a smooth transition home.     Follow Up Recommendations Home health PT;Supervision for mobility/OOB    Equipment Recommendations  Rolling walker with 5" wheels    Recommendations for Other Services       Precautions / Restrictions Precautions Precautions: Posterior Hip Precaution Booklet Issued: No Precaution Comments: Reviewed precautions verbally Required Braces or Orthoses: Other Brace/Splint Other Brace/Splint: abductor brace Restrictions Weight Bearing Restrictions: Yes RLE Weight Bearing: Partial weight bearing RLE Partial Weight Bearing Percentage or Pounds: 30      Mobility  Bed Mobility Overal bed mobility: Needs Assistance Bed Mobility: Supine to Sit     Supine to sit: Mod assist     General bed mobility comments: Mod A to bring RLE EOB and cues for using railings to sit upright at  EOB  Transfers Overall transfer level: Needs assistance Equipment used: Rolling walker (2 wheeled) Transfers: Sit to/from [facility] Sit to Stand: Mod assist;From elevated surface Stand pivot transfers: Min assist       General transfer comment: Mod A for sit to stand and MIn A throughout pivot transfer with cues for wbing restrictions.   Ambulation/Gait             General Gait Details: deferred this session. Will perform next session  Stairs            Wheelchair Mobility    Modified Rankin (Stroke Patients Only)       Balance Overall balance assessment: Needs assistance Sitting-balance support: Single extremity supported;Feet supported Sitting balance-Leahy Scale: Fair Sitting balance - Comments: leaning to left and using rail for support Postural control: Left lateral lean Standing balance support: Bilateral upper extremity supported;During functional activity Standing balance-Leahy Scale: Poor Standing balance comment: Relies on RW for stability                             Pertinent Vitals/Pain Pain Assessment: 0-10 Pain Score: 4  Pain Location: right hip Pain Descriptors / Indicators: Aching;Grimacing;Guarding Pain Intervention(s): Monitored during session;Premedicated before session;Repositioned    Home Living Family/patient expects to be discharged to:: Private residence Living Arrangements: Spouse/significant other Available Help at Discharge: Family;Friend(s);Personal care attendant;Available 24 hours/day;Available PRN/intermittently Type of Home: House Home Access: Ramped entrance     Home Layout:  One level Home Equipment: Bedside commode;Shower seat;Other (comment);Grab bars - toilet;Grab bars - tub/shower;Hand held shower head (hoyer lift, trapeze bar)      Prior Function Level of Independence: Independent         Comments: was completely and full time caregiver for his wife prior to admission      Hand Dominance   Dominant Hand: Left    Extremity/Trunk Assessment   Upper Extremity Assessment Upper Extremity Assessment: Defer to OT evaluation    Lower Extremity Assessment Lower Extremity Assessment: RLE deficits/detail RLE Deficits / Details: pt with post op pain and weakness. No formal MMT performed. Grossly 3/5 ankle and 2/5 knee and hip.  RLE: Unable to fully assess due to immobilization       Communication   Communication: No difficulties  Cognition Arousal/Alertness: Awake/alert Behavior During Therapy: WFL for tasks assessed/performed Overall Cognitive Status: Within Functional Limits for tasks assessed                                        General Comments      Exercises Total Joint Exercises Ankle Circles/Pumps: AROM;Both;20 reps;Supine Quad Sets: AROM;Both;10 reps;Supine   Assessment/Plan    PT Assessment Patient needs continued PT services  PT Problem List Decreased range of motion;Decreased strength;Decreased activity tolerance;Decreased balance;Decreased mobility;Decreased knowledge of use of DME;Pain       PT Treatment Interventions DME instruction;Gait training;Functional mobility training;Therapeutic activities;Therapeutic exercise;Balance training    PT Goals (Current goals can be found in the Care Plan section)  Acute Rehab PT Goals Patient Stated Goal: to get home to his wife  PT Goal Formulation: With patient Time For Goal Achievement: 08/15/16 Potential to Achieve Goals: Good    Frequency Min 5X/week   Barriers to discharge        Co-evaluation               End of Session Equipment Utilized During Treatment: Gait belt;Other (comment) (abduction brace) Activity Tolerance: Patient tolerated treatment well Patient left: in chair;with call bell/phone within reach Nurse Communication: Mobility status PT Visit Diagnosis: Difficulty in walking, not elsewhere classified (R26.2);Pain Pain - Right/Left:  Right Pain - part of body: Hip    Time: 6644-0347 PT Time Calculation (min) (ACUTE ONLY): 34 min   Charges:   PT Evaluation $PT Eval Moderate Complexity: 1 Procedure PT Treatments $Therapeutic Activity: 8-22 mins   PT G Codes:        Scheryl Marten PT, DPT  [phone]   Shanon Rosser 08/08/2016, 12:04 PM

## 2016-08-08 NOTE — Progress Notes (Signed)
   Subjective: 1 Day Post-Op Procedure(s) (LRB): RIGHT TOTAL HIP REVISION (Right)  Pt doing fairly well Pain is mild to moderate at times Denies any numbness or tingling distally Hip brace in place Patient reports pain as moderate.  Objective:   VITALS:   Vitals:   08/07/16 2300 08/08/16 0415  BP: 119/60 (!) 111/56  Pulse: (!) 121 (!) 108  Resp:    Temp: 98 F (36.7 C) 98 F (36.7 C)    Right hip incision healing well Brace in place Drain pulled nv intact distally  LABS  Recent Labs  08/06/16 0804 08/07/16 0508 08/08/16 0313  HGB 13.7 13.4 9.5*  HCT 41.1 40.0 28.8*  WBC 8.4 7.4 12.0*  PLT 253 257 247     Recent Labs  08/06/16 0804 08/07/16 0508 08/08/16 0313  NA 138 138 137  K 4.2 3.9 4.7  BUN '12 15 14  '$ CREATININE 1.10 1.12 1.23  GLUCOSE 127* 145* 166*     Assessment/Plan: 1 Day Post-Op Procedure(s) (LRB): RIGHT TOTAL HIP REVISION (Right) PT/OT D/c planning Pain management Pulmonary toilet    Brad Jakaree Pickard, MPAS, PA-C  08/08/2016, 10:08 AM

## 2016-08-09 LAB — CBC
HCT: 26.6 % — ABNORMAL LOW (ref 39.0–52.0)
Hemoglobin: 9 g/dL — ABNORMAL LOW (ref 13.0–17.0)
MCH: 27.8 pg (ref 26.0–34.0)
MCHC: 33.8 g/dL (ref 30.0–36.0)
MCV: 82.1 fL (ref 78.0–100.0)
PLATELETS: 230 10*3/uL (ref 150–400)
RBC: 3.24 MIL/uL — AB (ref 4.22–5.81)
RDW: 14.9 % (ref 11.5–15.5)
WBC: 12.3 10*3/uL — AB (ref 4.0–10.5)

## 2016-08-09 LAB — BASIC METABOLIC PANEL
Anion gap: 7 (ref 5–15)
BUN: 20 mg/dL (ref 6–20)
CO2: 26 mmol/L (ref 22–32)
Calcium: 8.4 mg/dL — ABNORMAL LOW (ref 8.9–10.3)
Chloride: 101 mmol/L (ref 101–111)
Creatinine, Ser: 1.21 mg/dL (ref 0.61–1.24)
GFR calc Af Amer: >60 mL/min (ref >60)
GFR, EST NON AFRICAN AMERICAN: 59 mL/min — AB (ref >60)
GLUCOSE: 135 mg/dL — AB (ref 65–99)
POTASSIUM: 4.9 mmol/L (ref 3.5–5.1)
Sodium: 134 mmol/L — ABNORMAL LOW (ref 135–145)

## 2016-08-09 LAB — GLUCOSE, CAPILLARY: Glucose-Capillary: 134 mg/dL — ABNORMAL HIGH (ref 65–99)

## 2016-08-09 MED ORDER — POLYETHYLENE GLYCOL 3350 17 G PO PACK
17.0000 g | PACK | Freq: Two times a day (BID) | ORAL | Status: DC
  Administered 2016-08-09 – 2016-08-10 (×3): 17 g via ORAL
  Filled 2016-08-09 (×3): qty 1

## 2016-08-09 MED ORDER — SENNOSIDES-DOCUSATE SODIUM 8.6-50 MG PO TABS
2.0000 | ORAL_TABLET | Freq: Two times a day (BID) | ORAL | Status: DC
  Administered 2016-08-09 – 2016-08-10 (×3): 2 via ORAL
  Filled 2016-08-09 (×3): qty 2

## 2016-08-09 NOTE — Discharge Summary (Signed)
[facility] Discharge Summary  Patient name: Alan Hurst Medical record number: [id number] Date of birth: April 18, 1946 Age: 71 y.o. Gender: male Date of Admission: 08/04/2016  Date of Discharge: 08/10/2016 Admitting Physician: [name], MD  Primary Care Provider: [name], DO Consultants: Orthopedic Surgery   Indication for Hospitalization: Hip pain s/p fall   Discharge Diagnoses/Problem List:  Right periprosthetic femur fracture HLD CKD T2DM  Disposition: Home with [facility] PT  Discharge Condition: Stable, improved  Discharge Exam:  General: Sitting up in bed in NAD HEENT: EOMI, MMM Cardiovascular: mildly tachycardic, no murmurs, 2+ DP pulses bilaterally Respiratory: CTAB, normal work of breathing on RA Gastrointestinal: soft, NT, nondistended, +BS MSK: Hip brace in place, lower extremities warm and well-perfused Neuro: AAOx3, sensation intact to light touch in the distal extremities  Brief Hospital Course:  Alan Hurst a 71 y.o.malepresenting with hip pain s/p fall, found to have a periprosthetic femur fracture. 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   Pt sustained mechanical fall at home in kitchen after slipping on wet floor. CT right hip revealed acute, comminuted, closed periprosthetic fracture of the right femur and horizontal oblique fracture. Orthopedics were consulted and performed repair of the fractures 3/30. Pt tolerated procedures well. PT/OT recommended home health with rolling walker. A bedside commode was also provided. Orthopedics d/c pt on Eliquis for DVT prophylaxis. Pt to wear hip abduction brace and f/u at ortho office.  Issues for Follow Up:  1. ASCVD risk calculated to be >7.5%.  At home was taking Lovastatin 40 mg but may benefit from high intensity statin.  Was started on Lipitor 40 mg daily this hospitalization.  2. To follow with Orthopedics outpatient and  wear hip abduction brace in the meantime. Pt d/c on Eliquis 2.5 mg PO BID for 1 month. 3. HHPT/OT arranged prior to d/c. Rolling walker, bedside commode provided. 4. Pt given Senna-S and Miralax with Norco 5-325 mg q6h, #14 pills and no refills. Bowl movement passed after constipation for 1 week prior to d/c. Please follow up.  Significant Procedures: Surgery for THA revision 08/07/2016.   Significant Labs and Imaging:   Recent Labs Lab 08/08/16 0313 08/09/16 0520 08/10/16 0531  WBC 12.0* 12.3* 11.0*  HGB 9.5* 9.0* 8.4*  HCT 28.8* 26.6* 24.9*  PLT 247 230 245    Recent Labs Lab 08/06/16 0804 08/07/16 0508 08/07/16 1014 08/08/16 0313 08/09/16 0520 08/10/16 0531  NA 138 138 139 137 134* 133*  K 4.2 3.9 4.3 4.7 4.9 4.3  CL 105 104  --  103 101 100*  CO2 24 24  --  '27 26 26  '$ GLUCOSE 127* 145* 160* 166* 135* 116*  BUN 12 15  --  '14 20 15  '$ CREATININE 1.10 1.12  --  1.23 1.21 0.99  CALCIUM 9.2 9.1  --  8.4* 8.4* 8.4*   Results/Tests Pending at Time of Discharge: None  Discharge Medications:  Allergies as of 08/10/2016      Reactions   No Known Allergies       Medication List    STOP taking these medications   aspirin EC 81 MG tablet     TAKE these medications   apixaban 2.5 MG Tabs tablet Commonly known as:  ELIQUIS Take 1 tablet (2.5 mg total) by mouth 2 (two) times daily.   Fish Oil 1000 MG Caps Take 1 capsule by mouth daily.   HYDROcodone-acetaminophen 5-325 MG tablet  Commonly known as:  NORCO/VICODIN Take 1-2 tablets by mouth every 6 (six) hours as needed for moderate pain.   lisinopril 2.5 MG tablet Commonly known as:  PRINIVIL,ZESTRIL TAKE 1 TABLET BY MOUTH  DAILY   lovastatin 40 MG tablet Commonly known as:  MEVACOR TAKE 1 TABLET BY MOUTH  DAILY   magnesium oxide 400 MG tablet Commonly known as:  MAG-OX Take 400 mg by mouth daily.   Melatonin 1 MG Tabs Take 3 tablets by mouth at bedtime.   metFORMIN 500 MG tablet Commonly known as:   GLUCOPHAGE TAKE 1 TABLET BY MOUTH  DAILY   multivitamin tablet Take 1 tablet by mouth daily.   omeprazole 20 MG capsule Commonly known as:  PRILOSEC TAKE 1 CAPSULE BY MOUTH  DAILY   polyethylene glycol packet Commonly known as:  MIRALAX / GLYCOLAX Take 17 g by mouth 2 (two) times daily.   senna-docusate 8.6-50 MG tablet Commonly known as:  Senokot-S Take 2 tablets by mouth 2 (two) times daily.   Vitamin D 2000 units tablet Take 1 tablet by mouth daily.   Vitamin-B Complex Tabs Take 1 tablet by mouth daily.     ASK your doctor about these medications   Raised Toilet Seat Misc 1 Device by Does not apply route once. Ask about: Should I take this medication?            Durable Medical Equipment        Start     Ordered   08/10/16 0000  For home use only DME Walker rolling    Comments:  5 inch roller  Question:  Patient needs a walker to treat with the following condition  Answer:  Leg fracture   08/10/16 1607   08/10/16 0000  DME Bedside commode    Question:  Patient needs a bedside commode to treat with the following condition  Answer:  Leg fracture   08/10/16 1607     Discharge Instructions: Please refer to Patient Instructions section of EMR for full details.  Patient was counseled important signs and symptoms that should prompt return to medical care, changes in medications, dietary instructions, activity restrictions, and follow up appointments.   Follow-Up Appointments: Follow-up Information    S[place], Horald Pollen, MD. Schedule an appointment as soon as possible for a visit in 2 week(s).   Specialty:  Orthopedic Surgery Why:  For wound re-check, For suture removal Contact information: [address] [address] [place] [address] [place] [address] [phone]        [facility] Follow up.   Why:  A representative from [facility] will contact you to arrange start date and time for therapy. Contact information: [place] [address] [place], DO 08/11/2016, 6:04 AM PGY-1, [facility]

## 2016-08-09 NOTE — Progress Notes (Signed)
  Patient Details:  SABAN HEINLEN June 13, 1945 [id number]    Subjective: 2 Days Post-Op Procedure(s) (LRB): RIGHT TOTAL HIP REVISION (Right)   The patient is comfortable in bed. He is tolerating food with no difficulty.  He has not had a bowel movement since Monday but he is passing flatus.  He has been doing PT with some difficulty. Denies any numbness or tingling distally Patient reports pain as mild to moderate.  Objective:   VITALS:   Vitals:   08/08/16 2200 08/09/16 0500  BP: (!) 117/50 (!) 126/59  Pulse: (!) 121 (!) 123  Resp:    Temp: 99.4 F (37.4 C) 98.5 F (36.9 C)    Alert and Oriented x 3 Dressing intact at the right hip and in good condition with the brace in place. Grossly neurovascularly intact distally. Sensation intact to light touch. Pulses 2+ bilaterally. Capillary refill less than 2 seconds in the right foot. Able to wiggle toes with no difficulty.   LABS  Recent Labs  08/07/16 0508 08/08/16 0313 08/09/16 0520  HGB 13.4 9.5* 9.0*  HCT 40.0 28.8* 26.6*  WBC 7.4 12.0* 12.3*  PLT 257 247 230     Recent Labs  08/07/16 0508 08/08/16 0313 08/09/16 0520  NA 138 137 134*  K 3.9 4.7 4.9  BUN '15 14 20  '$ CREATININE 1.12 1.23 1.21  GLUCOSE 145* 166* 135*     Assessment/Plan: 2 Days Post-Op Procedure(s) (LRB): RIGHT TOTAL HIP REVISION (Right) Continue PT/OT. Encouraged patient to continue with Pulmonary Toilet protocol.  Continue with pain management as needed.  Continue discharge planning.   Brynda Peon 08/09/2016, 8:52 AM

## 2016-08-09 NOTE — Progress Notes (Signed)
Physical Therapy Treatment Patient Details Name: Alan Hurst MRN: [id number] DOB: 06-Jan-1946 Today's Date: 08/09/2016    History of Present Illness Pt is a 71 yo male admitted through ED following a fall at home on 08/03/16. Pt was trying to break up a fight between his cat and dog. Pt suffered a R hip fx at his previous prosthesis which required a Revision to his THA on 08/07/16. PMH significant for HTN, DM2, HLD, B [place], recovering alcoholic.     PT Comments    Pt is POD 2 and continues to be moving slowly with therapy. Performed short distance gait to recliner with cues for sequencing. Pt maintains good weight bearing through RLE. Pt continues to be in increased pain, but has hopes of returning home. HR is elevated prior to this session and pt admits to be anxious regarding having a BM and being able to return home to his wife.     Follow Up Recommendations  Home health PT;Supervision for mobility/OOB     Equipment Recommendations  Rolling walker with 5" wheels    Recommendations for Other Services       Precautions / Restrictions Precautions Precautions: Posterior Hip Precaution Booklet Issued: No Precaution Comments: Reviewed precautions verbally Required Braces or Orthoses: Other Brace/Splint Other Brace/Splint: abductor brace Restrictions Weight Bearing Restrictions: Yes RLE Weight Bearing: Partial weight bearing RLE Partial Weight Bearing Percentage or Pounds: 30    Mobility  Bed Mobility Overal bed mobility: Needs Assistance Bed Mobility: Supine to Sit     Supine to sit: Mod assist     General bed mobility comments: Mod A this session to bring RLE OOB  Transfers Overall transfer level: Needs assistance Equipment used: Rolling walker (2 wheeled) Transfers: Sit to/from [facility] Sit to Stand: Mod assist;Min assist;From elevated surface Stand pivot transfers: Min assist       General transfer comment: Mod assist for  initial sit<>stand from lower surface; min assist from raised bed.  Ambulation/Gait                 Stairs            Wheelchair Mobility    Modified Rankin (Stroke Patients Only)       Balance Overall balance assessment: Needs assistance Sitting-balance support: Single extremity supported;Feet supported Sitting balance-Leahy Scale: Fair   Postural control: Left lateral lean Standing balance support: Bilateral upper extremity supported;During functional activity Standing balance-Leahy Scale: Poor Standing balance comment: Relies on RW for stability                            Cognition Arousal/Alertness: Awake/alert Behavior During Therapy: WFL for tasks assessed/performed Overall Cognitive Status: Within Functional Limits for tasks assessed                                        Exercises Total Joint Exercises Ankle Circles/Pumps: AROM;Both;20 reps;Supine Quad Sets: AROM;Both;10 reps;Supine    General Comments        Pertinent Vitals/Pain Pain Assessment: 0-10 Pain Score: 6  Pain Location: right hip Pain Descriptors / Indicators: Aching;Grimacing;Guarding Pain Intervention(s): Monitored during session;Limited activity within patient's tolerance;Repositioned;RN gave pain meds during session;Patient requesting pain meds-RN notified;Ice applied    Home Living  Prior Function            PT Goals (current goals can now be found in the care plan section) Acute Rehab PT Goals Patient Stated Goal: to get home to his wife  Progress towards PT goals: Progressing toward goals    Frequency    Min 5X/week      PT Plan Current plan remains appropriate    Co-evaluation             End of Session Equipment Utilized During Treatment: Gait belt;Other (comment) (abduction brace) Activity Tolerance: Patient tolerated treatment well Patient left: in chair;with call bell/phone within  reach Nurse Communication: Mobility status PT Visit Diagnosis: Difficulty in walking, not elsewhere classified (R26.2);Pain Pain - Right/Left: Right Pain - part of body: Hip     Time: 9163-8466 PT Time Calculation (min) (ACUTE ONLY): 30 min  Charges:  $Therapeutic Exercise: 8-22 mins $Therapeutic Activity: 8-22 mins                    G Codes:       Scheryl Marten PT, DPT  [phone]    Jacqulyn Liner Sloan Leiter 08/09/2016, 12:31 PM

## 2016-08-09 NOTE — Progress Notes (Signed)
Family Medicine Teaching Service Daily Progress Note Intern Pager: [phone]  Patient name: Alan Hurst Medical record number: [id number] Date of birth: 10/18/1945 Age: 71 y.o. Gender: male  Primary Care Provider: [name], DO Consultants: Ortho  Code Status: FULL  Pt Overview and Major Events to Date:  -Admit 3/28 -Total hip revision 3/30 with Dr. Lyla Glassing  Assessment and Plan: Alan Hurst is a 71 y.o. male presenting with hip pain s/p fall, found to have a periprosthetic femur fracture. 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   Right Periprosthetic Femur Fracture: POD #2 from a total hip revision on 3/30. Did not require morphine overnight. -Ortho following, appreciate recommendations -Stop Morphine prn, continue Norco 1-2 tablets q6hrs prn -Zofran and Reglan prn for nausea -PT/OT recommending home health- orders have been placed.  Constipation: Pt has not had a BM for almost 1 week. Likely related to narcotics. No vomiting and passing gas so SBO or ileus is unlikely. Abdomen mildly distended but soft without rebound or guarding. - Schedule Senokot-S 2 tablets bid and Miralax bid. - May need to consider suppository vs enema if no BM today  HLD: Last lipid panel Jan 2018: Cholesterol 160, HDL 51, LDL 81, Triglycerides 140. On Lovastatin 40 mg and ASA 81 mg daily at home.   -Lipitor 40 mg daily -Continue home Aspirin '81mg'$    T2DM: Well controlled with recent A1c 5.9 in Jan 2018. On Metformin 500 mg daily. CBG 134 this morning. -CBG daily  -hold metformin while hospitalized  CKD: Cr 1.12 > 1.23 > 1.21. Baseline Cr appears to be 1.2. Likely related to h/o T2DM.  -monitor BMET -avoid nephrotoxic agents   FEN/GI: Carb-modified diet, Protonix  Prophylaxis: Eliquis  Disposition: Discussed with ortho PA in person, plan for discharge home 4/2  Subjective:  Patient states he is doing fine today. He was able to get  up with PT yesterday. He has not had a bowel movement for almost 1 week and is starting to have some abdominal discomfort. No vomiting. Passing gas.  Objective: Temp:  [98.5 F (36.9 C)-99.4 F (37.4 C)] 98.5 F (36.9 C) (04/01 0500) Pulse Rate:  [121-123] 123 (04/01 0500) BP: (117-126)/(50-59) 126/59 (04/01 0500) SpO2:  [94 %-95 %] 95 % (04/01 0500)   Physical Exam: General: Sitting up in bed in NAD HEENT: EOMI, MMM Cardiovascular: RRR, no murmurs, 2+ DP pulses bilaterally Respiratory: CTAB, [place] in place Gastrointestinal: soft, NT, mildly distended, +BS MSK: Hip brace in place, lower extremities warm and well-perfused Neuro: AAOx3, sensation intact to light touch in the distal extremities  Laboratory:  Recent Labs Lab 08/07/16 0508 08/08/16 0313 08/09/16 0520  WBC 7.4 12.0* 12.3*  HGB 13.4 9.5* 9.0*  HCT 40.0 28.8* 26.6*  PLT 257 247 230    Recent Labs Lab 08/07/16 0508 08/08/16 0313 08/09/16 0520  NA 138 137 134*  K 3.9 4.7 4.9  CL 104 103 101  CO2 '24 27 26  '$ BUN '15 14 20  '$ CREATININE 1.12 1.23 1.21  CALCIUM 9.1 8.4* 8.4*  GLUCOSE 145* 166* 135*   Imaging/Diagnostic Tests: CT right hip- acute, comminuted, closed periprosthetic fracture of the right femur along side the noncemented femoral stem of a total hip arthroplasty. A horizontal oblique fracture component wasseen exiting roughly 2.5 cm from the tip of the uncemented femoral stem with minimal medial and anterior displacement of the distal fracture fragment.   Alan Hua, MD 08/09/2016, 8:47 AM  PGY-2, [place] Intern pager: [phone], text pages welcome

## 2016-08-09 NOTE — Plan of Care (Signed)
Problem: Safety: Goal: Ability to remain free from injury will improve Outcome: Progressing Safety precautions maintained  Problem: Tissue Perfusion: Goal: Risk factors for ineffective tissue perfusion will decrease Outcome: Progressing No s/s of dvt noted, scd are on. Patient is on Lovenox  Problem: Activity: Goal: Risk for activity intolerance will decrease Outcome: Progressing resting in the bed  Problem: Bowel/Gastric: Goal: Will not experience complications related to bowel motility Outcome: Progressing Given Senna, reported no BM since the 26th  Problem: Physical Regulation: Goal: Will remain free from infection Outcome: Progressing No s/s of infection noted, vs wnl Goal: Postoperative complications will be avoided or minimized Outcome: Progressing No post op complications noted  Problem: Self-Concept: Goal: Verbalizations of decreased anxiety will increase Outcome: Progressing No signs of anxiety noted, patient is very calm and quiet  Problem: Pain Management: Goal: Pain level will decrease Outcome: Progressing Medicated once for pain with full relief

## 2016-08-10 DIAGNOSIS — S7290XA Unspecified fracture of unspecified femur, initial encounter for closed fracture: Secondary | ICD-10-CM

## 2016-08-10 LAB — BASIC METABOLIC PANEL
ANION GAP: 7 (ref 5–15)
BUN: 15 mg/dL (ref 6–20)
CALCIUM: 8.4 mg/dL — AB (ref 8.9–10.3)
CO2: 26 mmol/L (ref 22–32)
Chloride: 100 mmol/L — ABNORMAL LOW (ref 101–111)
Creatinine, Ser: 0.99 mg/dL (ref 0.61–1.24)
GFR calc non Af Amer: >60 mL/min (ref >60)
Glucose, Bld: 116 mg/dL — ABNORMAL HIGH (ref 65–99)
Potassium: 4.3 mmol/L (ref 3.5–5.1)
Sodium: 133 mmol/L — ABNORMAL LOW (ref 135–145)

## 2016-08-10 LAB — CBC
HEMATOCRIT: 24.9 % — AB (ref 39.0–52.0)
HEMOGLOBIN: 8.4 g/dL — AB (ref 13.0–17.0)
MCH: 27.4 pg (ref 26.0–34.0)
MCHC: 33.7 g/dL (ref 30.0–36.0)
MCV: 81.1 fL (ref 78.0–100.0)
Platelets: 245 10*3/uL (ref 150–400)
RBC: 3.07 MIL/uL — ABNORMAL LOW (ref 4.22–5.81)
RDW: 14.7 % (ref 11.5–15.5)
WBC: 11 10*3/uL — AB (ref 4.0–10.5)

## 2016-08-10 LAB — POCT I-STAT 4, (NA,K, GLUC, HGB,HCT)
Glucose, Bld: 160 mg/dL — ABNORMAL HIGH (ref 65–99)
HCT: 34 % — ABNORMAL LOW (ref 39.0–52.0)
HEMOGLOBIN: 11.6 g/dL — AB (ref 13.0–17.0)
POTASSIUM: 4.3 mmol/L (ref 3.5–5.1)
SODIUM: 139 mmol/L (ref 135–145)

## 2016-08-10 LAB — GLUCOSE, CAPILLARY
GLUCOSE-CAPILLARY: 115 mg/dL — AB (ref 65–99)
GLUCOSE-CAPILLARY: 137 mg/dL — AB (ref 65–99)

## 2016-08-10 MED ORDER — MILK AND MOLASSES ENEMA
1.0000 | Freq: Once | RECTAL | Status: AC
  Administered 2016-08-10: 250 mL via RECTAL
  Filled 2016-08-10: qty 250

## 2016-08-10 MED ORDER — HYDROCODONE-ACETAMINOPHEN 5-325 MG PO TABS: 1.0000 | ORAL_TABLET | Freq: Four times a day (QID) | ORAL | 0 refills | Status: AC | PRN

## 2016-08-10 MED ORDER — APIXABAN 2.5 MG PO TABS: 2.5000 mg | ORAL_TABLET | Freq: Two times a day (BID) | ORAL | 0 refills | Status: DC

## 2016-08-10 MED ORDER — RAISED TOILET SEAT MISC: 1.0000 | Freq: Once | 0 refills | Status: AC

## 2016-08-10 MED ORDER — POLYETHYLENE GLYCOL 3350 17 G PO PACK: 17.0000 g | PACK | Freq: Two times a day (BID) | ORAL | 0 refills | Status: DC

## 2016-08-10 MED ORDER — SENNOSIDES-DOCUSATE SODIUM 8.6-50 MG PO TABS: 2.0000 | ORAL_TABLET | Freq: Two times a day (BID) | ORAL | 0 refills | Status: DC

## 2016-08-10 NOTE — Progress Notes (Signed)
qPhysical Therapy Treatment Patient Details Name: Alan Hurst MRN: [id number] DOB: Jul 21, 1945 Today's Date: 08/10/2016    History of Present Illness Pt is a 71 yo male admitted through ED following a fall at home on 08/03/16. Pt was trying to break up a fight between his cat and dog. Pt suffered a R hip fx at his previous prosthesis which required a Revision to his THA on 08/07/16. PMH significant for HTN, DM2, HLD, B [place], recovering alcoholic.     PT Comments    Patient is making progress toward mobility goals. Continues to be limited by elevated HR up to 143 with ambulation with pt diaphoretic and fatigued.  Discussed self monitoring for rest breaks. Continue to progress as tolerated.   Follow Up Recommendations  Home health PT;Supervision for mobility/OOB     Equipment Recommendations  Rolling walker with 5" wheels    Recommendations for Other Services       Precautions / Restrictions Precautions Precautions: Posterior Hip Precaution Comments: pt able to recall 2/3 precautions; reviewed 3/3 Required Braces or Orthoses: Other Brace/Splint Other Brace/Splint: abductor brace Restrictions Weight Bearing Restrictions: Yes RLE Weight Bearing: Partial weight bearing RLE Partial Weight Bearing Percentage or Pounds: 30    Mobility  Bed Mobility Overal bed mobility: Needs Assistance Bed Mobility: Supine to Sit     Supine to sit: Min guard     General bed mobility comments: min guard for safety; increased time and effort; cues for sequencing and technique to maintain post hip precautions  Transfers Overall transfer level: Needs assistance Equipment used: Rolling walker (2 wheeled) Transfers: Sit to/from Stand Sit to Stand: Min assist;From elevated surface         General transfer comment: assist to stabilize RW and to steady upon standing; cues for safe hand placement and technique to maintian PWB status  Ambulation/Gait Ambulation/Gait assistance:  Min assist Ambulation Distance (Feet):  (83f X2; seated break due to elevated HR and symptomatic) Assistive device: Rolling walker (2 wheeled) Gait Pattern/deviations: Step-to pattern;Decreased stance time - right;Decreased step length - left;Decreased weight shift to right Gait velocity: decreased   General Gait Details: cues for sequencing and technique to maintain PWB status; L shoe donned to help maintain WB status; pt required seated rest break due to HR up to 143 with ambulation and pt diaphoretic and reported feeling like his rate is racing; HR down to 112 with seated break and back up next bout of ambulation to upper 130s   Stairs            Wheelchair Mobility    Modified Rankin (Stroke Patients Only)       Balance Overall balance assessment: Needs assistance Sitting-balance support: Single extremity supported;Feet supported Sitting balance-Leahy Scale: Fair Sitting balance - Comments: L lateral lean   Standing balance support: Bilateral upper extremity supported;During functional activity Standing balance-Leahy Scale: Poor Standing balance comment: Relies on RW for stability                            Cognition Arousal/Alertness: Awake/alert Behavior During Therapy: WFL for tasks assessed/performed Overall Cognitive Status: Within Functional Limits for tasks assessed                                        Exercises      General Comments  Pertinent Vitals/Pain Pain Assessment: Faces Faces Pain Scale: Hurts little more Pain Location: right hip Pain Descriptors / Indicators: Aching;Grimacing;Guarding Pain Intervention(s): Limited activity within patient's tolerance;Monitored during session;Premedicated before session;Repositioned    Home Living                      Prior Function            PT Goals (current goals can now be found in the care plan section) Acute Rehab PT Goals Patient Stated Goal: to get  home to his wife  Progress towards PT goals: Progressing toward goals    Frequency    Min 5X/week      PT Plan Current plan remains appropriate    Co-evaluation             End of Session Equipment Utilized During Treatment: Gait belt;Other (comment) (abduction brace) Activity Tolerance: Treatment limited secondary to medical complications (Comment) (elevated HR) Patient left: in chair;with call bell/phone within reach Nurse Communication: Mobility status PT Visit Diagnosis: Difficulty in walking, not elsewhere classified (R26.2);Pain Pain - Right/Left: Right Pain - part of body: Hip     Time: 6599-3570 PT Time Calculation (min) (ACUTE ONLY): 35 min  Charges:  $Gait Training: 8-22 mins $Therapeutic Activity: 8-22 mins                    G Codes:       Earney Navy, PTA Pager: [phone]     Darliss Cheney 08/10/2016, 1:57 PM

## 2016-08-10 NOTE — Progress Notes (Signed)
Family Medicine Teaching Service Daily Progress Note Intern Pager: [phone]  Patient name: Alan Hurst Medical record number: [id number] Date of birth: [birth year] Age: 71 y.o. Gender: male  Primary Care Provider: [name], DO Consultants: Ortho  Code Status: FULL  Pt Overview and Major Events to Date:  03/28: Admit for right hip pain 03/30: Total hip revision with Dr. Lyla Glassing  Assessment and Plan: Alan Hurst is a 71 y.o. male presenting with hip pain s/p fall, found to have a periprosthetic femur fracture. 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   Right Periprosthetic Femur Fracture: POD #3 from a total hip revision on 3/30. --Ortho following, appreciate recommendations --Norco 1-2 tablets q6hrs prn --Zofran and Reglan prn for nausea --PT/OT recommending home health- orders have been placed.  Constipation: Pt has not had a BM for almost 1 week. Likely related to narcotics. No vomiting and passing gas so SBO or ileus is unlikely. Abdomen mildly distended but soft without rebound or guarding. --Schedule Senokot-S 2 tablets bid and Miralax bid. --May need to consider suppository vs enema if no BM today  HLD: Last lipid panel Jan 2018: Cholesterol 160, HDL 51, LDL 81, Triglycerides 140. On Lovastatin 40 mg and ASA 81 mg daily at home.   -Lipitor 40 mg daily -Continue home Aspirin '81mg'$    T2DM: Well controlled with recent A1c 5.9 in Jan 2018. On Metformin 500 mg daily. CBG 134 this morning. --CBG daily  --Hold metformin while hospitalized  CKD: Cr 1.12 > 1.23 > 1.21. Baseline Cr appears to be 1.2. Likely related to h/o T2DM.  --Monitor BMET --Avoid nephrotoxic agents   FEN/GI: Carb-modified diet, Protonix  Prophylaxis: Eliquis  Disposition: Pending bowel movement. Anticipate discharge home later today.  Subjective:  Patient states he is doing great today. Pain is well-controlled. Passing gas but no bowel  movement as of 1 week.  Objective: Temp:  [98.6 F (37 C)-98.7 F (37.1 C)] 98.6 F (37 C) (04/02 0648) Pulse Rate:  [107-111] 111 (04/02 0648) Resp:  [16] 16 (04/02 0648) BP: (118-126)/(51-59) 118/51 (04/02 0648) SpO2:  [94 %-98 %] 94 % (04/02 8588)   Physical Exam: General: Sitting up in bed in NAD HEENT: EOMI, MMM Cardiovascular: RRR, no murmurs, 2+ DP pulses bilaterally Respiratory: CTAB, Alan Hurst in place Gastrointestinal: soft, NT, nondistended, +BS MSK: Hip brace in place, lower extremities warm and well-perfused Neuro: AAOx3, sensation intact to light touch in the distal extremities  Laboratory:  Recent Labs Lab 08/08/16 0313 08/09/16 0520 08/10/16 0531  WBC 12.0* 12.3* 11.0*  HGB 9.5* 9.0* 8.4*  HCT 28.8* 26.6* 24.9*  PLT 247 230 245    Recent Labs Lab 08/08/16 0313 08/09/16 0520 08/10/16 0531  NA 137 134* 133*  K 4.7 4.9 4.3  CL 103 101 100*  CO2 '27 26 26  '$ BUN '14 20 15  '$ CREATININE 1.23 1.21 0.99  CALCIUM 8.4* 8.4* 8.4*  GLUCOSE 166* 135* 116*   Imaging/Diagnostic Tests: CT right hip- acute, comminuted, closed periprosthetic fracture of the right femur along side the noncemented femoral stem of a total hip arthroplasty. A horizontal oblique fracture component wasseen exiting roughly 2.5 cm from the tip of the uncemented femoral stem with minimal medial and anterior displacement of the distal fracture fragment.   [place] Bing, DO 08/10/2016, 7:23 AM PGY-1, [place] Intern pager: [phone], text pages welcome

## 2016-08-10 NOTE — Anesthesia Postprocedure Evaluation (Signed)
Anesthesia Post Note  Patient: Alan Hurst  Procedure(s) Performed: Procedure(s) (LRB): RIGHT TOTAL HIP REVISION (Right)  Patient location during evaluation: PACU Anesthesia Type: General Level of consciousness: awake and alert Pain management: pain level controlled Vital Signs Assessment: post-procedure vital signs reviewed and stable Respiratory status: spontaneous breathing, nonlabored ventilation and respiratory function stable Cardiovascular status: blood pressure returned to baseline and stable Postop Assessment: no signs of nausea or vomiting Anesthetic complications: no       Last Vitals:  Vitals:   08/09/16 2137 08/10/16 0648  BP: (!) 126/59 (!) 118/51  Pulse: (!) 107 (!) 111  Resp: 16 16  Temp: 37.1 C 37 C    Last Pain:  Vitals:   08/10/16 0648  TempSrc: Oral  PainSc:                  Lynda Rainwater

## 2016-08-10 NOTE — Progress Notes (Signed)
   Subjective:  Patient reports pain as mild to moderate.  No c/o. Currently working with PT.  Objective:   VITALS:   Vitals:   08/08/16 2200 08/09/16 0500 08/09/16 2137 08/10/16 0648  BP: (!) 117/50 (!) 126/59 (!) 126/59 (!) 118/51  Pulse: (!) 121 (!) 123 (!) 107 (!) 111  Resp:   16 16  Temp: 99.4 F (37.4 C) 98.5 F (36.9 C) 98.7 F (37.1 C) 98.6 F (37 C)  TempSrc: Oral Oral Oral Oral  SpO2: 94% 95% 98% 94%  Weight:      Height:        NAD ABD soft Sensation intact distally Intact pulses distally Dorsiflexion/Plantar flexion intact Incision: dressing C/D/I Compartment soft   Lab Results  Component Value Date   WBC 11.0 (H) 08/10/2016   HGB 8.4 (L) 08/10/2016   HCT 24.9 (L) 08/10/2016   MCV 81.1 08/10/2016   PLT 245 08/10/2016   BMET    Component Value Date/Time   NA 133 (L) 08/10/2016 0531   NA 138 05/26/2016 0921   K 4.3 08/10/2016 0531   CL 100 (L) 08/10/2016 0531   CO2 26 08/10/2016 0531   GLUCOSE 116 (H) 08/10/2016 0531   BUN 15 08/10/2016 0531   BUN 14 05/26/2016 0921   CREATININE 0.99 08/10/2016 0531   CREATININE 1.27 (H) 12/26/2015 0808   CALCIUM 8.4 (L) 08/10/2016 0531   GFRNONAA >60 08/10/2016 0531   GFRNONAA 57 (L) 12/26/2015 0808   GFRAA >60 08/10/2016 0531   GFRAA 66 12/26/2015 0808     Assessment/Plan: 3 Days Post-Op   Active Problems:   Femur fracture ([place])   Fall   

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
## 2016-08-10 NOTE — Care Management Important Message (Signed)
Important Message  Patient Details  Name: Alan Hurst MRN: [id number] Date of Birth: [birth year]   Medicare Important Message Given:  Yes    Orbie Pyo 08/10/2016, 4:43 PM
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
## 2016-08-11 ENCOUNTER — Telehealth: Payer: Self-pay

## 2016-08-11 ENCOUNTER — Encounter (HOSPITAL_COMMUNITY): Payer: Self-pay | Admitting: Orthopedic Surgery

## 2016-08-11 NOTE — Telephone Encounter (Signed)
I cannot prescribe pain medication beyond 7 days. Instructed patient to call orthopedic office for additional pain medication. Thank you. -- Harriet Butte, [place], PGY-1

## 2016-08-11 NOTE — Telephone Encounter (Signed)
Pt called because he was told he would be given pain meds to cover him for 2 weeks. His Rx was for 14 pills and to take 1-2 tablets every 6 hours for pain. He only has enough pills for tomorrow. Does not have enough for tomorrow pm. Please let him know when and where he can pick up the Rx. Ottis Stain, CMA

## 2016-08-12 ENCOUNTER — Telehealth: Payer: Self-pay

## 2016-08-12 NOTE — Telephone Encounter (Signed)
Pt informed and said he called ortho today. Bennetta Rudden Kennon Holter, CMA
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
## 2016-08-25 ENCOUNTER — Telehealth: Payer: Self-pay | Admitting: Family Medicine

## 2016-08-25 NOTE — Telephone Encounter (Signed)
Alan Hurst @ [facility] a [facility] cld they need Pt's latest BP & A1C reading faxed to [phone]-- or call to [phone] pls use Ref# [id number]

## 2016-08-25 NOTE — Telephone Encounter (Signed)
Please go ahead and send requested records.  Charyl Bigger, CMA

## 2016-08-29 ENCOUNTER — Other Ambulatory Visit: Payer: Self-pay | Admitting: Family Medicine

## 2016-08-29 DIAGNOSIS — E0800 Diabetes mellitus due to underlying condition with hyperosmolarity without nonketotic hyperglycemic-hyperosmolar coma (NKHHC): Secondary | ICD-10-CM

## 2016-08-29 DIAGNOSIS — K219 Gastro-esophageal reflux disease without esophagitis: Secondary | ICD-10-CM

## 2016-08-31 NOTE — Telephone Encounter (Signed)
Please call pt to schedule OV.  He must have office visit prior to any further refills on medications.  Charyl Bigger, CMA

## 2016-09-01 ENCOUNTER — Telehealth: Payer: Self-pay

## 2016-09-01 NOTE — Telephone Encounter (Signed)
Alan Hurst, OT with [facility] called stating that pt had total hip replacement and is only 10% weight bearing.  He is having problems with keeping his groin dry d/t urinating on himself and now has a very red rash in the groin area.  Per Dr. Raliegh Scarlet, advised Mr. Hurst to instruct pt to keep area as dry as possible, allow the groin area to air out several times daily and use OTC Monistat or Lamisil to the area.  Pt to call if symptoms persist.  Mr. Hurst stated that he would relay this information to the patient.  Charyl Bigger, CMA

## 2016-09-07 ENCOUNTER — Telehealth: Payer: Self-pay

## 2016-09-07 ENCOUNTER — Ambulatory Visit (INDEPENDENT_AMBULATORY_CARE_PROVIDER_SITE_OTHER): Payer: Medicare Other | Admitting: Family Medicine

## 2016-09-07 ENCOUNTER — Ambulatory Visit: Payer: Medicare Other

## 2016-09-07 ENCOUNTER — Telehealth: Payer: Self-pay | Admitting: Family Medicine

## 2016-09-07 ENCOUNTER — Encounter: Payer: Self-pay | Admitting: Family Medicine

## 2016-09-07 VITALS — BP 98/59 | HR 112 | Ht 68.0 in | Wt 195.0 lb

## 2016-09-07 DIAGNOSIS — B356 Tinea cruris: Secondary | ICD-10-CM | POA: Diagnosis not present

## 2016-09-07 DIAGNOSIS — Z79899 Other long term (current) drug therapy: Secondary | ICD-10-CM | POA: Diagnosis not present

## 2016-09-07 DIAGNOSIS — B354 Tinea corporis: Secondary | ICD-10-CM | POA: Diagnosis not present

## 2016-09-07 MED ORDER — TERBINAFINE HCL 250 MG PO TABS: 250.0000 mg | ORAL_TABLET | Freq: Every day | ORAL | 0 refills | Status: DC

## 2016-09-07 MED ORDER — MICONAZOLE NITRATE 2 % EX CREA: 1.0000 "application " | TOPICAL_CREAM | Freq: Two times a day (BID) | CUTANEOUS | 0 refills | Status: DC

## 2016-09-07 MED ORDER — ZINC OXIDE 40 % EX OINT: 1.0000 "application " | TOPICAL_OINTMENT | CUTANEOUS | 0 refills | Status: DC | PRN

## 2016-09-07 NOTE — Telephone Encounter (Signed)
Please advise.  T. Nelson, CMA 

## 2016-09-07 NOTE — Patient Instructions (Signed)
  Jock Itch Jock itch (tinea cruris) is a fungal infection of the skin in the groin area. It is sometimes called ringworm, even though it is not caused by worms. It is caused by a fungus, which is a type of germ that thrives in dark, damp places. Jock itch causes a rash and itching in the groin and upper thigh area. It usually goes away in 2-3 weeks with treatment. What are the causes? The fungus that causes jock itch may be spread by:  Touching a fungus infection elsewhere on your body-such as athlete's foot-and then touching your groin area.  Sharing towels or clothing with an infected person. What increases the risk? Jock itch is most common in men and adolescent boys. This condition is more likely to develop from:  Being in hot, humid climates.  Wearing tight-fitting clothing or wet bathing suits for long periods of time.  Participating in sports.  Being overweight.  Having diabetes. What are the signs or symptoms? Symptoms of jock itch may include:  A red, pink, or brown rash in the groin area. The rash may spread to the thighs, anus, and buttocks.  Dry and scaly skin on or around the rash.  Itchiness. How is this diagnosed? Most often, a health care provider can make the diagnosis by looking at your rash. Sometimes, a scraping of the infected skin will be taken. This sample may be tested by looking at it under a microscope or by trying to grow the fungus from the sample (culture). How is this treated? Treatment for this condition may include:  Antifungal medicine to kill the fungus. This may be in various forms:  Skin cream or ointment.  Medicine taken by mouth.  Skin cream or ointment to reduce the itching.  Compresses or medicated powders to dry the infected skin. Follow these instructions at home:  Take medicines only as directed by your health care provider. Apply skin creams or ointments exactly as directed.  Wear loose-fitting clothing.  Men should wear  cotton boxer shorts.  Women should wear cotton underwear.  Change your underwear every day to keep your groin dry.  Avoid hot baths.  Dry your groin area well after bathing.  Use a separate towel to dry your groin area. This will help to prevent a spreading of the infection to other areas of your body.  Do not scratch the affected area.  Do not share towels with other people. Contact a health care provider if:  Your rash does not improve or it gets worse after 2 weeks of treatment.  Your rash is spreading.  Your rash returns after treatment is finished.  You have a fever.  You have redness, swelling, or pain in the area around your rash.  You have fluid, blood, or pus coming from your rash.  Your have your rash for more than 4 weeks. This information is not intended to replace advice given to you by your health care provider. Make sure you discuss any questions you have with your health care provider. Document Released: 04/17/2002 Document Revised: 10/03/2015 Document Reviewed: 02/06/2014 Elsevier Interactive Patient Education  2017 [employer].

## 2016-09-07 NOTE — Telephone Encounter (Signed)
Patient forgot to talk to Dr. Jenetta Downer about a med while he was here today. While in patient he was put on Eliquis, he wants to know if he needs to continue with the med and if so he needs a refill called into the [facility] on [address]. He would like a call back either way explaining if he needs to continue it or not.

## 2016-09-07 NOTE — Telephone Encounter (Signed)
Pt called stating that Dr. Raliegh Scarlet advised him to use a powder for his yeast infection, however he doesn't remember the name of it.  Please advise of the name.  Charyl Bigger, CMA

## 2016-09-07 NOTE — Progress Notes (Signed)
Impression and Recommendations:    1. Tinea cruris   2. Tinea corporis   3. Long-term current use of high risk medication other than anticoagulant    - extensive tineal infection starting in groin region and now spread to other areas due to self inoculation.   - Will need oral and topical cream and powder recommended - avoid scratching and spreading - keep areas dry and open to air as much as possible;  May use diaper rash type ointments to help protect skin in contact with wet diaper/pads - lft future to monitor/   The patient was counseled, risk factors were discussed, anticipatory guidance given.   New Prescriptions   LIVER OIL-ZINC OXIDE (DESITIN) 40 % OINTMENT    Apply 1 application topically as needed for irritation. Apply after each diaper/pad change   MICONAZOLE (MICATIN) 2 % CREAM    Apply 1 application topically 2 (two) times daily. Use for 2 months or until rash completely gone   TERBINAFINE (LAMISIL) 250 MG TABLET    Take 1 tablet (250 mg total) by mouth daily.     Meds ordered this encounter  Medications         . terbinafine (LAMISIL) 250 MG tablet    Sig: Take 1 tablet (250 mg total) by mouth daily.    Dispense:  30 tablet    Refill:  0  . miconazole (MICATIN) 2 % cream    Sig: Apply 1 application topically 2 (two) times daily. Use for 2 months or until rash completely gone    Dispense:  28.35 g    Refill:  0  . liver oil-zinc oxide (DESITIN) 40 % ointment    Sig: Apply 1 application topically as needed for irritation. Apply after each diaper/pad change    Dispense:  56.7 g    Refill:  0   Orders Placed This Encounter  Procedures  . ALT  . AST   Gross side effects, risk and benefits, and alternatives of medications and treatment plan in general discussed with patient.  Patient is aware that all medications have potential side effects and we are unable to predict every side effect or drug-drug interaction that may occur.   Patient will call with any  questions prior to using medication if they have concerns.  Expresses verbal understanding and consents to current therapy and treatment regimen.  No barriers to understanding were identified.  Red flag symptoms and signs discussed in detail.  Patient expressed understanding regarding what to do in case of emergency\urgent symptoms  Please see AVS handed out to patient at the end of our visit for further patient instructions/ counseling done pertaining to today's office visit.   Return in about 4 weeks (around 10/05/2016), or if symptoms worsen or fail to improve return sooner/ call, for 4 wks- check ALT/AST and reck rash.     Note: This document was prepared using Dragon voice recognition software and may include unintentional dictation errors.   --------------------------------------------------------------------------------------------------------------------------------------------------------------------------------------------------------------------------------------------    Subjective:    CC:  Chief Complaint  Patient presents with  . Rash    HPI: Alan Hurst is a 71 y.o. male who presents to [employer] at [facility] today for issues as discussed below.   Rash every since sitting in wet diapers over night- ever since he had hip and femur sx recently by [facility]--> Sitting in wetness.  + itch started in groin.  Pt itches a lot and now has  spread to chest, antecubital fossa b/l, and even face.  No f/c, no open wounds/ sores per pt.   In his wife's electric wheelchair.  Self care has been lacking lately   Wt Readings from Last 3 Encounters:  09/07/16 195 lb (88.5 kg)  08/04/16 200 lb (90.7 kg)  04/24/16 198 lb 14.4 oz (90.2 kg)   BP Readings from Last 3 Encounters:  09/07/16 (!) 98/59  08/10/16 104/61  04/24/16 114/68   Pulse Readings from Last 3 Encounters:  09/07/16 (!) 112  08/10/16 (!) 106  04/24/16 87   BMI Readings from Last 3 Encounters:    09/07/16 29.65 kg/m  08/04/16 29.53 kg/m  04/24/16 30.24 kg/m     Patient Care Team    Relationship Specialty Notifications Start End  [name], DO PCP - General Family Medicine  11/21/15   Christain Sacramento, [place] Referring Physician Optometry  03/02/16    Comment: [employer]     Patient Active Problem List   Diagnosis Date Noted  . Chronic kidney disease- stage 3a 01/02/2016    Priority: High  . Type 2 diabetes mellitus without complication, without long-term current use of insulin ([place]) 12/05/2015    Priority: High  . HLD (hyperlipidemia) 12/05/2015    Priority: High  . Overweight (BMI 25.0-29.9) 03/02/2016    Priority: Medium  . Recovering alcoholic in remission ([place]) 12/05/2015    Priority: Medium  . History of tobacco abuse 12/05/2015    Priority: Medium  . GERD (gastroesophageal reflux disease) 12/05/2015    Priority: Low  . Sleep disorder 12/05/2015    Priority: Low  . Vitamin D insufficiency 12/05/2015    Priority: Low  . Vitamin B deficiency 12/05/2015    Priority: Low  . 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 Fall   . 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 Essential hypertension   . Femur fracture ([place]) 08/04/2016  . 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    Current Meds  Medication Sig  . apixaban (ELIQUIS) 2.5 MG TABS tablet Take 1 tablet (2.5 mg total) by mouth 2 (two) times daily.  . B Complex Vitamins (VITAMIN-B COMPLEX) TABS Take 1 tablet by mouth daily.  . Cholecalciferol (VITAMIN D) 2000 units tablet Take 1 tablet by mouth daily.  Marland Kitchen lisinopril (PRINIVIL,ZESTRIL) 2.5 MG tablet TAKE 1 TABLET BY MOUTH  DAILY  . lovastatin (MEVACOR) 40 MG tablet TAKE 1 TABLET BY MOUTH  DAILY  . magnesium oxide (MAG-OX)  400 MG tablet Take 400 mg by mouth daily.  . Melatonin 1 MG TABS Take 3 tablets by mouth at bedtime.  . metFORMIN (GLUCOPHAGE) 500 MG tablet TAKE 1 TABLET BY MOUTH  DAILY  . Multiple Vitamin (MULTIVITAMIN) tablet Take 1 tablet by mouth daily.  . Omega-3 Fatty Acids (FISH OIL) 1000 MG CAPS Take 1 capsule by mouth daily.  Marland Kitchen omeprazole (PRILOSEC) 20 MG capsule TAKE 1 CAPSULE BY MOUTH  DAILY  . senna-docusate (SENOKOT-S) 8.6-50 MG tablet Take 2 tablets by mouth 2 (two) times daily.  . [DISCONTINUED] polyethylene glycol (MIRALAX / GLYCOLAX) packet Take 17 g by mouth 2 (two) times daily.    Allergies:  Allergies  Allergen Reactions  . No Known Allergies      Review of Systems: General:   Denies fever, chills, unexplained weight  loss.  Optho/Auditory:   Denies visual changes, blurred vision/LOV Respiratory:   Denies wheeze, DOE more than baseline levels.  Cardiovascular:   Denies chest pain, palpitations, new onset peripheral edema  Gastrointestinal:   Denies nausea, vomiting, diarrhea, abd pain.  Genitourinary: Denies dysuria, freq/ urgency, flank pain or discharge from genitals.  Endocrine:     Denies hot or cold intolerance, polyuria, polydipsia. Musculoskeletal:   Denies unexplained myalgias, joint swelling, unexplained arthralgias, gait problems.  Skin:  Denies new onset rash, suspicious lesions Neurological:     Denies dizziness, unexplained weakness, numbness  Psychiatric/Behavioral:   Denies mood changes, suicidal or homicidal ideations, hallucinations   Objective:   Blood pressure (!) 98/59, pulse (!) 112, height '5\' 8"'$  (1.727 m), weight 195 lb (88.5 kg). Body mass index is 29.65 kg/m. General:  Well Developed, well nourished, appropriate for stated age.  Neuro:  Alert and oriented,  extra-ocular muscles intact, no conjunctival injection  HEENT:  Normocephalic, atraumatic Skin:  warm, pink.  + extensive tineal rash groin, upper legs, chest/trunk, b/l arms, even face.   Respiratory: Not using accessory muscles, speaking in full sentences- unlabored. Vascular:  Ext warm, no cyanosis apprec.; cap RF less 2 sec. Psych:  No HI/SI, judgement and insight good

## 2016-09-08 ENCOUNTER — Telehealth: Payer: Self-pay | Admitting: Family Medicine

## 2016-09-08 NOTE — Telephone Encounter (Signed)
Done.  See previous phone notes.  Charyl Bigger, CMA

## 2016-09-08 NOTE — Telephone Encounter (Signed)
Per hosp d/c notes:    "Pt sustained mechanical fall at home in kitchen after slipping on wet floor. CT right hip revealed acute, comminuted, closed periprosthetic fracture of the right femur and horizontal oblique fracture. Orthopedics were consulted and performed repair of the fractures 3/30. Pt tolerated procedures well. PT/OT recommended home health with rolling walker. A bedside commode was also provided. Orthopedics d/c pt on Eliquis for DVT prophylaxis. Pt to wear hip abduction brace and f/u at ortho office."  --> Thus pt will have to ask the treating doc about this.

## 2016-09-08 NOTE — Telephone Encounter (Signed)
Pt informed.  Pt expressed understanding and is agreeable.  T. Mehar Sagen, CMA  

## 2016-09-08 NOTE — Telephone Encounter (Signed)
Pt informed.  Pt expressed understanding and is agreeable.  T. Breon Diss, CMA  

## 2016-09-08 NOTE — Telephone Encounter (Signed)
Patient wants to speak with someone clinical, he has additional questions about some of things Dr. Jenetta Downer prescribed. He didn't directly specify what these items were on the VM.

## 2016-09-08 NOTE — Telephone Encounter (Signed)
Any OTC antifungal powder will do- I suggested lotrimin during our OV.

## 2016-09-18 ENCOUNTER — Telehealth: Payer: Self-pay | Admitting: Family Medicine

## 2016-09-18 NOTE — Telephone Encounter (Signed)
Patient called and stated that the rash he saw Dr. Jenetta Downer originally for a few weeks ago is clearing up but has now spread around the rest of his body and wants to speak to someone clinical about what he should do.

## 2016-09-21 NOTE — Telephone Encounter (Signed)
Pt states that the rash has spread to his legs, arms, under hip brace, and waist.  The rash is itching, no oozing sores, no chills, no fever, no N & V.  Pt states that his right LE is swelling, but denies fever to the area or sensitivity to touch.  Please advise.  Charyl Bigger, CMA

## 2016-09-21 NOTE — Telephone Encounter (Signed)
If his ROS is essentially negative excpt for itch and rash looks like it did when I saw him, we can give him an anti-itch med such as atarax to help with sx.    O/w tinea does not resolve quickly as we discussed last ov, and ESP in this HOT HUMID weather- esp if he is sweaty / damp under brace etc, it will be VERY difficult to clear up the tinea.   If rash worsens, let us know and maybe [name] can fit him onto her schedule to be seen for acute care OV in near future.

## 2016-09-22 ENCOUNTER — Other Ambulatory Visit: Payer: Self-pay | Admitting: Family Medicine

## 2016-09-22 DIAGNOSIS — L308 Other specified dermatitis: Secondary | ICD-10-CM

## 2016-09-22 DIAGNOSIS — R21 Rash and other nonspecific skin eruption: Secondary | ICD-10-CM | POA: Insufficient documentation

## 2016-09-22 MED ORDER — HYDROXYZINE HCL 50 MG PO TABS: 50.0000 mg | ORAL_TABLET | Freq: Four times a day (QID) | ORAL | 0 refills | Status: DC | PRN

## 2016-09-22 NOTE — Progress Notes (Signed)
Pt informed.  T. Tate Zagal, CMA 

## 2016-09-22 NOTE — Telephone Encounter (Signed)
Pt informed and expressed understanding.  Pt would like to have RX for Atarax sent to pharmacy.  Charyl Bigger, CMA

## 2016-09-22 NOTE — Progress Notes (Signed)
Please let pt know I sent it in

## 2016-09-28 ENCOUNTER — Ambulatory Visit (INDEPENDENT_AMBULATORY_CARE_PROVIDER_SITE_OTHER): Payer: Medicare Other | Admitting: Family Medicine

## 2016-09-28 ENCOUNTER — Encounter: Payer: Self-pay | Admitting: Family Medicine

## 2016-09-28 VITALS — BP 108/71 | HR 100 | Ht 68.0 in | Wt 181.5 lb

## 2016-09-28 DIAGNOSIS — Z79899 Other long term (current) drug therapy: Secondary | ICD-10-CM | POA: Diagnosis not present

## 2016-09-28 DIAGNOSIS — B354 Tinea corporis: Secondary | ICD-10-CM | POA: Diagnosis not present

## 2016-09-28 DIAGNOSIS — B356 Tinea cruris: Secondary | ICD-10-CM | POA: Diagnosis not present

## 2016-09-28 DIAGNOSIS — L259 Unspecified contact dermatitis, unspecified cause: Secondary | ICD-10-CM

## 2016-09-28 MED ORDER — TRIAMCINOLONE ACETONIDE 0.1 % EX CREA: 1.0000 "application " | TOPICAL_CREAM | Freq: Two times a day (BID) | CUTANEOUS | 0 refills | Status: DC

## 2016-09-28 NOTE — Progress Notes (Signed)
Impression and Recommendations:    1. Contact dermatitis, unspecified contact dermatitis type, unspecified trigger- diaper   2. Tinea corporis   3. Tinea cruris   4. Long-term current use of high risk medication other than anticoagulant     ---> please obtain LFT's prior leaving clinic today   I believe you have a dermatitis eczematous reaction on top of your pre-existing fungal infection from the diaper rash.   Please do not use the diapers any longer.  For this reason, you do not have to use the zinc oxide.   Please continue the Lamisil pills as well as the miconazole creams in the groin and buttock region.  After your done with the Lamisil pills, you can use terbinafine cream if you wish.   For the eczema, please use the triamcinolone cream - to be picked up at the pharmacy.   Also to help with itching, please use Benadryl which is an H1 blocker as well as Zantac ( ranitdine) an H2 blocker.     Please do not use these medicines along with hydroxyzine\  atarax as you will have compounded the sedating effects of the meds.   Also, we put in referral for you to follow up with dermatology as you have in the past.  Please call [name] if you have any questions about this in the future.   The patient was counseled, risk factors were discussed, anticipatory guidance given.   Meds ordered this encounter  Medications         . terbinafine (LAMISIL) 250 MG tablet    Sig: Take 1 tablet (250 mg total) by mouth daily.    Dispense:  30 tablet    Refill:  0  . miconazole (MICATIN) 2 % cream    Sig: Apply 1 application topically 2 (two) times daily. Use for 2 months or until rash completely gone    Dispense:  28.35 g    Refill:  0  . liver oil-zinc oxide (DESITIN) 40 % ointment    Sig: Apply 1 application topically as needed for irritation. Apply after each diaper/pad change    Dispense:  56.7 g    Refill:  0   Orders Placed This Encounter  Procedures  . Ambulatory  referral to Dermatology   Gross side effects, risk and benefits, and alternatives of medications and treatment plan in general discussed with patient.  Patient is aware that all medications have potential side effects and we are unable to predict every side effect or drug-drug interaction that may occur.   Patient will call with any questions prior to using medication if they have concerns.  Expresses verbal understanding and consents to current therapy and treatment regimen.  No barriers to understanding were identified.  Red flag symptoms and signs discussed in detail.  Patient expressed understanding regarding what to do in case of emergency\urgent symptoms  Please see AVS handed out to patient at the end of our visit for further patient instructions/ counseling done pertaining to today's office visit.   Return if symptoms worsen or fail to improve, for also f/up chronic med prob.     Note: This document was prepared using Dragon voice recognition software and may include unintentional dictation errors.   --------------------------------------------------------------------------------------------------------------------------------------------------------------------------------------------------------------------------------------------    Subjective:    CC:  Chief Complaint  Patient presents with  . Rash    HPI: Alan Hurst is a 71 y.o. male who presents to [place] at [facility] today  for issues as discussed below.  Rash directly in groin somewhat improved and now where diaper and hip brace appeared to contact skin--> W.  Pt relates today that this the same itchy rash he had in yrs past-- seen by Derm- Dr Radford Pax- txed him several rounds of creams, medicines, injections- more than a few.   Pt says that this rash lasted months in past and was difficult to resolve.    IN groin- much improved- then he put on his hip brace and rash went down leg and upper hip- now  into foot/ ankle etc.  Really itchy.   No benadryl etc.   Last OV:    Rash every since sitting in wet diapers over night- ever since he had hip and femur sx recently by [facility]--> Sitting in wetness.  + itch started in groin.  Pt itches a lot and now has spread to chest, antecubital fossa b/l, and even face.  No f/c, no open wounds/ sores per pt.   In his wife's electric wheelchair.  Self care has been lacking lately   Wt Readings from Last 3 Encounters:  09/28/16 181 lb 8 oz (82.3 kg)  09/07/16 195 lb (88.5 kg)  08/04/16 200 lb (90.7 kg)   BP Readings from Last 3 Encounters:  09/28/16 108/71  09/07/16 (!) 98/59  08/10/16 104/61   Pulse Readings from Last 3 Encounters:  09/28/16 100  09/07/16 (!) 112  08/10/16 (!) 106   BMI Readings from Last 3 Encounters:  09/28/16 27.60 kg/m  09/07/16 29.65 kg/m  08/04/16 29.53 kg/m     Patient Care Team    Relationship Specialty Notifications Start End  [name], DO PCP - General Family Medicine  11/21/15   Christain Sacramento, [place] Referring Physician Optometry  03/02/16    Comment: Mirna Mires, MD Attending Physician Dermatology  09/28/16      Patient Active Problem List   Diagnosis Date Noted  . Chronic kidney disease- stage 3a 01/02/2016    Priority: High  . Type 2 diabetes mellitus without complication, without long-term current use of insulin ([place]) 12/05/2015    Priority: High  . HLD (hyperlipidemia) 12/05/2015    Priority: High  . Overweight (BMI 25.0-29.9) 03/02/2016    Priority: Medium  . Recovering alcoholic in remission ([place]) 12/05/2015    Priority: Medium  . History of tobacco abuse 12/05/2015    Priority: Medium  . GERD (gastroesophageal reflux disease) 12/05/2015    Priority: Low  . Sleep disorder 12/05/2015    Priority: Low  . Vitamin D insufficiency 12/05/2015    Priority: Low  . Vitamin B deficiency 12/05/2015    Priority: Low  . Rash and nonspecific skin eruption 09/22/2016  .  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 Fall   . 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 Essential hypertension   . Femur fracture ([place]) 08/04/2016  . 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    Current Meds  Medication Sig  . B Complex Vitamins (VITAMIN-B COMPLEX) TABS Take 1 tablet by mouth daily.  . Cholecalciferol (VITAMIN D) 2000 units tablet Take 1 tablet by mouth daily.  . hydrOXYzine (ATARAX/VISTARIL) 50 MG tablet Take 1 tablet (50 mg total) by mouth every 6 (six) hours as needed for itching.  Marland Kitchen lisinopril (  PRINIVIL,ZESTRIL) 2.5 MG tablet TAKE 1 TABLET BY MOUTH  DAILY  . liver oil-zinc oxide (DESITIN) 40 % ointment Apply 1 application topically as needed for irritation. Apply after each diaper/pad change  . lovastatin (MEVACOR) 40 MG tablet TAKE 1 TABLET BY MOUTH  DAILY  . magnesium oxide (MAG-OX) 400 MG tablet Take 400 mg by mouth daily.  . Melatonin 1 MG TABS Take 3 tablets by mouth at bedtime.  . metFORMIN (GLUCOPHAGE) 500 MG tablet TAKE 1 TABLET BY MOUTH  DAILY  . miconazole (MICATIN) 2 % cream Apply 1 application topically 2 (two) times daily. Use for 2 months or until rash completely gone  . Multiple Vitamin (MULTIVITAMIN) tablet Take 1 tablet by mouth daily.  . Omega-3 Fatty Acids (FISH OIL) 1000 MG CAPS Take 1 capsule by mouth daily.  Marland Kitchen omeprazole (PRILOSEC) 20 MG capsule TAKE 1 CAPSULE BY MOUTH  DAILY  . terbinafine (LAMISIL) 250 MG tablet Take 1 tablet (250 mg total) by mouth daily.    Allergies:  Allergies  Allergen Reactions  . No Known Allergies      Review of Systems: General:   Denies fever, chills, unexplained weight loss.  Optho/Auditory:   Denies visual changes, blurred vision/LOV Respiratory:   Denies wheeze,  DOE more than baseline levels.  Cardiovascular:   Denies chest pain, palpitations, new onset peripheral edema  Gastrointestinal:   Denies nausea, vomiting, diarrhea, abd pain.  Genitourinary: Denies dysuria, freq/ urgency, flank pain or discharge from genitals.  Endocrine:     Denies hot or cold intolerance, polyuria, polydipsia. Musculoskeletal:   Denies unexplained myalgias, joint swelling, unexplained arthralgias, gait problems.  Skin:  Denies new onset rash, suspicious lesions Neurological:     Denies dizziness, unexplained weakness, numbness  Psychiatric/Behavioral:   Denies mood changes, suicidal or homicidal ideations, hallucinations   Objective:   Blood pressure 108/71, pulse 100, height '5\' 8"'$  (1.727 m), weight 181 lb 8 oz (82.3 kg). Body mass index is 27.6 kg/m. General:  Well Developed, well nourished, appropriate for stated age.  Neuro:  Alert and oriented,  extra-ocular muscles intact, no conjunctival injection  HEENT:  Normocephalic, atraumatic Skin:  warm, pink.  + extensive erythematous maculopapular rash groin, upper legs, and appears smaller erythematous papules on chest/trunk, b/l arms and legs- different appearance Respiratory: Not using accessory muscles, speaking in full sentences- unlabored. Vascular:  Ext warm, no cyanosis apprec.; cap RF less 2 sec. Psych:  No HI/SI, judgement and insight good

## 2016-09-28 NOTE — Patient Instructions (Addendum)
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   Please do not use the diapers any longer.  For this reason, you do not have to use the zinc oxide.   Please continue the Lamisil pills as well as the miconazole creams in the groin and buttock region.  After your done with the Lamisil pills, you can use terbinafine cream if you wish.   For the eczema, please use the triamcinolone cream - to be picked up at the pharmacy.   Also to help with itching, please use Benadryl which is an H1 blocker as well as Zantac ( ranitdine) an H2 blocker.     Please do not use these medicines along with hydroxyzine\  atarax as you will have compounded the sedating effects of the meds.   Also, we put in referral for you to follow up with dermatology as you have in the past.  Please call [name] if you have any questions about this in the future.    Eczema Eczema, also called atopic dermatitis, is a skin disorder that causes inflammation of the skin. It causes a red rash and dry, scaly skin. The skin becomes very itchy. Eczema is generally worse during the cooler winter months and often improves with the warmth of summer. Eczema usually starts showing signs in infancy. Some children outgrow eczema, but it may last through adulthood. What are the causes? The exact cause of eczema is not known, but it appears to run in families. People with eczema often have a family history of eczema, allergies, asthma, or hay fever. Eczema is not contagious. Flare-ups of the condition may be caused by:  Contact with something you are sensitive or allergic to.  Stress. What are the signs or symptoms?  Dry, scaly skin.  Red, itchy rash.  Itchiness. This may occur before the skin rash and may be very intense. How is this diagnosed? The diagnosis of eczema is usually made based on symptoms and  medical history. How is this treated? Eczema cannot be cured, but symptoms usually can be controlled with treatment and other strategies. A treatment plan might include:  Controlling the itching and scratching.  Use over-the-counter antihistamines as directed for itching. This is especially useful at night when the itching tends to be worse.  Use over-the-counter steroid creams as directed for itching.  Avoid scratching. Scratching makes the rash and itching worse. It may also result in a skin infection (impetigo) due to a break in the skin caused by scratching.  Keeping the skin well moisturized with creams every day. This will seal in moisture and help prevent dryness. Lotions that contain alcohol and water should be avoided because they can dry the skin.  Limiting exposure to things that you are sensitive or allergic to (allergens).  Recognizing situations that cause stress.  Developing a plan to manage stress. Follow these instructions at home:  Only take over-the-counter or prescription medicines as directed by your health care provider.  Do not use anything on the skin without checking with your health care provider.  Keep baths or showers short (5 minutes) in warm (not hot) water. Use mild cleansers for bathing. These should be unscented. You may add nonperfumed bath oil to the bath water. It is best to avoid soap and bubble bath.  Immediately after a bath or shower, when the skin is still damp, apply a moisturizing  ointment to the entire body. This ointment should be a petroleum ointment. This will seal in moisture and help prevent dryness. The thicker the ointment, the better. These should be unscented.  Keep fingernails cut short. Children with eczema may need to wear soft gloves or mittens at night after applying an ointment.  Dress in clothes made of cotton or cotton blends. Dress lightly, because heat increases itching.  A child with eczema should stay away from anyone  with fever blisters or cold sores. The virus that causes fever blisters (herpes simplex) can cause a serious skin infection in children with eczema. Contact a health care provider if:  Your itching interferes with sleep.  Your rash gets worse or is not better within 1 week after starting treatment.  You see pus or soft yellow scabs in the rash area.  You have a fever.  You have a rash flare-up after contact with someone who has fever blisters. This information is not intended to replace advice given to you by your health care provider. Make sure you discuss any questions you have with your health care provider. Document Released: 04/24/2000 Document Revised: 10/03/2015 Document Reviewed: 11/28/2012 Elsevier Interactive Patient Education  2017 [employer].

## 2016-09-29 LAB — AST: AST: 20 IU/L (ref 0–40)

## 2016-09-29 LAB — ALT: ALT: 12 IU/L (ref 0–44)

## 2016-10-07 ENCOUNTER — Ambulatory Visit: Payer: Medicare Other | Admitting: Family Medicine

## 2016-10-09 HISTORY — PX: CATARACT EXTRACTION, BILATERAL: SHX1313

## 2016-10-19 ENCOUNTER — Other Ambulatory Visit: Payer: Self-pay | Admitting: Family Medicine

## 2016-10-19 DIAGNOSIS — K219 Gastro-esophageal reflux disease without esophagitis: Secondary | ICD-10-CM

## 2016-10-19 DIAGNOSIS — E0800 Diabetes mellitus due to underlying condition with hyperosmolarity without nonketotic hyperglycemic-hyperosmolar coma (NKHHC): Secondary | ICD-10-CM

## 2016-10-26 ENCOUNTER — Encounter: Payer: Self-pay | Admitting: Family Medicine

## 2016-10-26 ENCOUNTER — Ambulatory Visit (INDEPENDENT_AMBULATORY_CARE_PROVIDER_SITE_OTHER): Payer: Medicare Other | Admitting: Family Medicine

## 2016-10-26 VITALS — BP 105/71 | HR 96 | Ht 68.0 in | Wt 179.8 lb

## 2016-10-26 DIAGNOSIS — R21 Rash and other nonspecific skin eruption: Secondary | ICD-10-CM | POA: Diagnosis not present

## 2016-10-26 DIAGNOSIS — N183 Chronic kidney disease, stage 3 unspecified: Secondary | ICD-10-CM

## 2016-10-26 DIAGNOSIS — L308 Other specified dermatitis: Secondary | ICD-10-CM

## 2016-10-26 DIAGNOSIS — E782 Mixed hyperlipidemia: Secondary | ICD-10-CM | POA: Diagnosis not present

## 2016-10-26 DIAGNOSIS — G479 Sleep disorder, unspecified: Secondary | ICD-10-CM

## 2016-10-26 DIAGNOSIS — E663 Overweight: Secondary | ICD-10-CM | POA: Diagnosis not present

## 2016-10-26 DIAGNOSIS — K219 Gastro-esophageal reflux disease without esophagitis: Secondary | ICD-10-CM | POA: Diagnosis not present

## 2016-10-26 DIAGNOSIS — E119 Type 2 diabetes mellitus without complications: Secondary | ICD-10-CM

## 2016-10-26 MED ORDER — LOVASTATIN 40 MG PO TABS: 40.0000 mg | ORAL_TABLET | Freq: Every day | ORAL | 1 refills | Status: DC

## 2016-10-26 MED ORDER — LISINOPRIL 2.5 MG PO TABS: 2.5000 mg | ORAL_TABLET | Freq: Every day | ORAL | 1 refills | Status: DC

## 2016-10-26 MED ORDER — METFORMIN HCL 500 MG PO TABS: 500.0000 mg | ORAL_TABLET | Freq: Every day | ORAL | 1 refills | Status: DC

## 2016-10-26 MED ORDER — OMEPRAZOLE 20 MG PO CPDR: 20.0000 mg | DELAYED_RELEASE_CAPSULE | Freq: Every day | ORAL | 1 refills | Status: DC

## 2016-10-26 NOTE — Assessment & Plan Note (Signed)
>>  ASSESSMENT AND PLAN FOR TYPE 2 DIABETES MELLITUS WITHOUT COMPLICATION, WITHOUT LONG-TERM CURRENT USE OF INSULIN ([place]) WRITTEN ON 10/26/2016 10:30 AM BY OPALSKI, DEBORAH, DO  A1c 5.9 in January.  Patient has been well controlled for many years, wishes to continue medications.  Discussed how metformin can slow progression of diabetes and since patient doing well and desires to continue, we will continue medication.  Yearly foot and eye exams.    Patient having cataract surgery and upcoming couple months.

## 2016-10-26 NOTE — Assessment & Plan Note (Signed)
Omeprazole appears to be a daily necessity.  Refill provided.

## 2016-10-26 NOTE — Assessment & Plan Note (Signed)
Refill Mevacor given.  FLP last checked in January

## 2016-10-26 NOTE — Assessment & Plan Note (Signed)
Continue melatonin when necessary.

## 2016-10-26 NOTE — Assessment & Plan Note (Signed)
A1c 5.9 in January.  Patient has been well controlled for many years, wishes to continue medications.  Discussed how metformin can slow progression of diabetes and since patient doing well and desires to continue, we will continue medication.  Yearly foot and eye exams.    Patient having cataract surgery and upcoming couple months.

## 2016-10-26 NOTE — Patient Instructions (Signed)

## 2016-10-26 NOTE — Assessment & Plan Note (Signed)
Recently seen Dr. Denna Haggard -agreed with contact dermatitis diagnosis and triamcinolone cream was prescribed to patient.    Patient never filled since that's what we gave him.  Rash now resolved.

## 2016-10-26 NOTE — Assessment & Plan Note (Signed)
Patient has lost a few pounds, encouraged to continue.

## 2016-10-26 NOTE — Progress Notes (Signed)
Impression and Recommendations:    1. Type 2 diabetes mellitus without complication, without long-term current use of insulin ([place])   2. Gastroesophageal reflux disease, esophagitis presence not specified   3. Stage 3 chronic kidney disease   4. Mixed hyperlipidemia   5. Sleep disorder   6. Rash and nonspecific skin eruption   7. Psoriasiform eczema   8. Overweight (BMI 25.0-29.9)     Chronic kidney disease- stage 3a Continue ACE inhibitor.  Serum creatinine decreasing when last checked.  Blood pressure stable without any side effects.  Discussed adequate water intake, exercise daily and prudent diet  HLD (hyperlipidemia) Refill Mevacor given.  FLP last checked in January    Type 2 diabetes mellitus without complication, without long-term current use of insulin ([facility]) A1c 5.9 in January.  Patient has been well controlled for many years, wishes to continue medications.  Discussed how metformin can slow progression of diabetes and since patient doing well and desires to continue, we will continue medication.  Yearly foot and eye exams.    Patient having cataract surgery and upcoming couple months.   Overweight (BMI 25.0-29.9) Patient has lost a few pounds, encouraged to continue.  GERD (gastroesophageal reflux disease) Omeprazole appears to be a daily necessity.  Refill provided.  Sleep disorder Continue melatonin when necessary.  Psoriasiform eczema Recently seen Dr. Denna Haggard -agreed with contact dermatitis diagnosis and triamcinolone cream was prescribed to patient.    Patient never filled since that's what we gave him.  Rash now resolved.    Education and routine counseling performed. Handouts provided.   Told patient they will need a yearly complete physical exam - which is when we do appropriate screening tests etc; this will need to be made in addition to any chronic disease mgt appts.   -  Refills given of metformin, omeprazole, Mevacor and  lisinopril today.   Return for For wellness exam & health maintenance eval-  in 3-4 mo.  The patient was counseled, risk factors were discussed, anticipatory guidance given.  Gross side effects, risk and benefits, and alternatives of medications discussed with patient.  Patient is aware that all medications have potential side effects and we are unable to predict every side effect or drug-drug interaction that may occur.  Expresses verbal understanding and consents to current therapy plan and treatment regimen.  Please see AVS handed out to patient at the end of our visit for further patient instructions/ counseling done pertaining to today's office visit.    Note: This document was prepared using Dragon voice recognition software and may include unintentional dictation errors.     Subjective:    Chief Complaint  Patient presents with  . Skin Problem    patient is here for follow up      Alan Hurst is a 71 y.o. male who presents [facility] today for Diabetes Management.    Having cataract sx next mo.     status post R total hip replacement: IN wheelchair about 1/2 time now since sx still- mandated by ortho.   F/up appt with them 26th this month. Off opiods excpt for tramadol only, feeling better  Rash- now resolved.   tvery please ith dr. Evern Core. He total pain to use trmcnolonecrea-same oe h we pried.    Gerd:  Omeprazole- needs to take daily.  If he trys not to take it-  will get heartburn in afternoons and be very uncomfortable.   CKD:  Lisinopril-   On low dose to help protect kidneys, no s-e, tol well  DM HPI: -  He has not really been working on diet and exercise for diabetes  Home glucose readings range under 100- FBS.    Denies polyuria/polydipsia. Denies hypo/ hyperglycemia symptoms - He denies new onset of: chest pain, exercise intolerance, shortness of breath, dizziness, visual changes, headache, lower extremity swelling or  claudication.   Last A1C in the office was:  Lab Results  Component Value Date   HGBA1C 5.9 05/26/2016   HGBA1C 6.2 03/02/2016   HGBA1C 6.0 12/05/2015    Lab Results  Component Value Date   MICROALBUR <0.2 12/26/2015   LDLCALC 81 05/26/2016   CREATININE 0.99 08/10/2016    Last 3 blood pressure readings in our office are as follows: BP Readings from Last 3 Encounters:  10/26/16 105/71  09/28/16 108/71  09/07/16 (!) 98/59     Patient Care Team    Relationship Specialty Notifications Start End  [name], DO PCP - General Family Medicine  11/21/15   Christain [place], [place] Referring Physician Optometry  03/02/16    Comment: Blanche East, MD Consulting Physician Dermatology  10/26/16      Patient Active Problem List   Diagnosis Date Noted  . Chronic kidney disease- stage 3a 01/02/2016    Priority: High  . Type 2 diabetes mellitus without complication, without long-term current use of insulin ([place]) 12/05/2015    Priority: High  . HLD (hyperlipidemia) 12/05/2015    Priority: High  . Overweight (BMI 25.0-29.9) 03/02/2016    Priority: Medium  . Recovering alcoholic in remission ([place]) 12/05/2015    Priority: Medium  . History of tobacco abuse 12/05/2015    Priority: Medium  . GERD (gastroesophageal reflux disease) 12/05/2015    Priority: Low  . Sleep disorder 12/05/2015    Priority: Low  . Vitamin D insufficiency 12/05/2015    Priority: Low  . Vitamin B deficiency 12/05/2015    Priority: Low  . Rash and nonspecific skin eruption 09/22/2016  . 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 Fall   . Periprosthetic fracture around internal prosthetic right hip joint ([place])   . Essential hypertension   . Femur fracture ([place]) 08/04/2016  . 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 Femur fracture, right ([place])   . GERD (gastroesophageal reflux disease) 12/05/2015   Well controlled on meds   . Hip fracture ([place])   . HLD (hyperlipidemia) 12/05/2015   10+ yrs at least.   . 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   [facility]  . 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 Vitamin B deficiency 12/05/2015  . Vitamin D insufficiency 12/05/2015     Past Surgical History:  Procedure Laterality Date  . FEMUR FRACTURE SURGERY    . HIP FRACTURE SURGERY    . JOINT REPLACEMENT Bilateral    hips  . TIBIA FRACTURE SURGERY    . TOTAL HIP REVISION Right 08/07/2016   Procedure: RIGHT TOTAL HIP REVISION;  Surgeon: Rod Can, MD;  Location: [facility];  Service: Orthopedics;  Laterality: Right;     Family History  Problem Relation Age of Onset  . Alcohol abuse Mother   . Alcohol abuse Father   . Congestive Heart Failure Father   . Kidney disease Father      History  Drug Use No  ,  History  Alcohol Use No  ,  History  Smoking  Status  . Former Smoker  . Quit date: 05/11/1993  Smokeless Tobacco  . Never Used  ,    Current Outpatient Prescriptions on File Prior to Visit  Medication Sig Dispense Refill  . B Complex Vitamins (VITAMIN-B COMPLEX) TABS Take 1 tablet by mouth daily.    . Cholecalciferol (VITAMIN D) 2000 units tablet Take 1 tablet by mouth daily.    . hydrOXYzine (ATARAX/VISTARIL) 50 MG tablet Take 1 tablet (50 mg total) by mouth every 6 (six) hours as needed for itching. 90 tablet 0  . liver oil-zinc oxide (DESITIN) 40 % ointment Apply 1 application topically as needed for irritation. Apply after each diaper/pad change 56.7 g 0  . magnesium oxide (MAG-OX) 400 MG tablet Take 400 mg by mouth daily.    . Melatonin 1 MG TABS Take 3 tablets by mouth at bedtime.    . miconazole (MICATIN) 2 % cream Apply 1 application topically 2 (two) times daily. Use for 2 months or until rash completely gone 28.35 g 0  . Multiple Vitamin (MULTIVITAMIN) tablet Take 1 tablet by mouth daily.    . Omega-3 Fatty Acids (FISH OIL) 1000 MG CAPS Take 1 capsule by mouth daily.    . traMADol (ULTRAM) 50 MG tablet  Take 1 tablet by mouth every 6 (six) hours as needed.    . triamcinolone cream (KENALOG) 0.1 % Apply 1 application topically 2 (two) times daily. Do not apply to fungal areas- groin, buttocks etc 453.6 g 0   No current facility-administered medications on file prior to visit.      Allergies  Allergen Reactions  . No Known Allergies      Review of Systems:   General:  Denies fever, chills Optho/Auditory:   Denies visual changes, blurred vision Respiratory:   Denies SOB, cough, wheeze, DIB  Cardiovascular:   Denies chest pain, palpitations, painful respirations Gastrointestinal:   Denies nausea, vomiting, diarrhea.  Endocrine:     Denies new hot or cold intolerance Musculoskeletal:  Denies joint swelling, gait issues, or new unexplained myalgias/ arthralgias Skin:  Denies rash, suspicious lesions  Neurological:    Denies dizziness, unexplained weakness, numbness  Psychiatric/Behavioral:   Denies mood changes    Objective:     Blood pressure 105/71, pulse 96, height 5\' 8"  (1.727 m), weight 179 lb 12 oz (81.5 kg).  Body mass index is 27.33 kg/m.  General: Well Developed, well nourished, and in no acute distress.  HEENT: Normocephalic, atraumatic, pupils equal round reactive to light, neck supple, No carotid bruits, no JVD Skin: Warm and dry, cap RF less 2 sec, no rash Cardiac: Regular rate and rhythm, S1, S2 WNL's, no murmurs rubs or gallops Respiratory: ECTA B/L, Not using accessory muscles, speaking in full sentences. NeuroM-Sk: Ambulates w/ assistance- wheeled walker, sensation grossly intact.  Ext: scant edema b/l lower ext, but improved from prior Psych: No HI/SI, judgement and insight good, Euthymic mood. Full Affect.

## 2016-10-26 NOTE — Assessment & Plan Note (Signed)
Continue ACE inhibitor.  Serum creatinine decreasing when last checked.  Blood pressure stable without any side effects.  Discussed adequate water intake, exercise daily and prudent diet

## 2017-01-26 ENCOUNTER — Ambulatory Visit: Payer: Medicare Other | Admitting: Family Medicine

## 2017-02-22 ENCOUNTER — Other Ambulatory Visit: Payer: Medicare Other

## 2017-02-22 DIAGNOSIS — E785 Hyperlipidemia, unspecified: Secondary | ICD-10-CM

## 2017-02-22 DIAGNOSIS — E539 Vitamin B deficiency, unspecified: Secondary | ICD-10-CM

## 2017-02-22 DIAGNOSIS — I10 Essential (primary) hypertension: Secondary | ICD-10-CM

## 2017-02-22 DIAGNOSIS — Z0000 Encounter for general adult medical examination without abnormal findings: Secondary | ICD-10-CM

## 2017-02-22 DIAGNOSIS — E559 Vitamin D deficiency, unspecified: Secondary | ICD-10-CM

## 2017-02-22 DIAGNOSIS — E119 Type 2 diabetes mellitus without complications: Secondary | ICD-10-CM

## 2017-02-23 LAB — HEMOGLOBIN A1C
Est. average glucose Bld gHb Est-mCnc: 117 mg/dL
Hgb A1c MFr Bld: 5.7 % — ABNORMAL HIGH (ref 4.8–5.6)

## 2017-02-23 LAB — CBC WITH DIFFERENTIAL/PLATELET
Basophils Absolute: 0 10*3/uL (ref 0.0–0.2)
Basos: 1 %
EOS (ABSOLUTE): 0.1 10*3/uL (ref 0.0–0.4)
EOS: 2 %
Hematocrit: 41.8 % (ref 37.5–51.0)
Hemoglobin: 13.7 g/dL (ref 13.0–17.7)
IMMATURE GRANS (ABS): 0 10*3/uL (ref 0.0–0.1)
IMMATURE GRANULOCYTES: 0 %
Lymphocytes Absolute: 2.1 10*3/uL (ref 0.7–3.1)
Lymphs: 33 %
MCH: 26.9 pg (ref 26.6–33.0)
MCHC: 32.8 g/dL (ref 31.5–35.7)
MCV: 82 fL (ref 79–97)
Monocytes Absolute: 0.7 10*3/uL (ref 0.1–0.9)
Monocytes: 11 %
Neutrophils Absolute: 3.3 10*3/uL (ref 1.4–7.0)
Neutrophils: 53 %
PLATELETS: 304 10*3/uL (ref 150–379)
RBC: 5.1 x10E6/uL (ref 4.14–5.80)
RDW: 18.4 % — ABNORMAL HIGH (ref 12.3–15.4)
WBC: 6.2 10*3/uL (ref 3.4–10.8)

## 2017-02-23 LAB — COMPREHENSIVE METABOLIC PANEL
A/G RATIO: 2 (ref 1.2–2.2)
ALT: 13 IU/L (ref 0–44)
AST: 17 IU/L (ref 0–40)
Albumin: 4.2 g/dL (ref 3.5–4.8)
Alkaline Phosphatase: 70 IU/L (ref 39–117)
BUN/Creatinine Ratio: 9 — ABNORMAL LOW (ref 10–24)
BUN: 9 mg/dL (ref 8–27)
Bilirubin Total: 0.3 mg/dL (ref 0.0–1.2)
CALCIUM: 9.5 mg/dL (ref 8.6–10.2)
CO2: 26 mmol/L (ref 20–29)
CREATININE: 1.03 mg/dL (ref 0.76–1.27)
Chloride: 98 mmol/L (ref 96–106)
GFR, EST AFRICAN AMERICAN: 84 mL/min/{1.73_m2} (ref >59)
GFR, EST NON AFRICAN AMERICAN: 73 mL/min/{1.73_m2} (ref >59)
Globulin, Total: 2.1 g/dL (ref 1.5–4.5)
Glucose: 104 mg/dL — ABNORMAL HIGH (ref 65–99)
Potassium: 5 mmol/L (ref 3.5–5.2)
Sodium: 137 mmol/L (ref 134–144)
TOTAL PROTEIN: 6.3 g/dL (ref 6.0–8.5)

## 2017-02-23 LAB — VITAMIN D 25 HYDROXY (VIT D DEFICIENCY, FRACTURES): VIT D 25 HYDROXY: 45.5 ng/mL (ref 30.0–100.0)

## 2017-02-23 LAB — TSH: TSH: 0.806 u[IU]/mL (ref 0.450–4.500)

## 2017-02-23 LAB — LIPID PANEL
CHOLESTEROL TOTAL: 132 mg/dL (ref 100–199)
Chol/HDL Ratio: 2.7 ratio (ref 0.0–5.0)
HDL: 49 mg/dL (ref >39)
LDL Calculated: 62 mg/dL (ref 0–99)
TRIGLYCERIDES: 104 mg/dL (ref 0–149)
VLDL CHOLESTEROL CAL: 21 mg/dL (ref 5–40)

## 2017-02-23 LAB — VITAMIN B12: Vitamin B-12: 515 pg/mL (ref 232–1245)

## 2017-02-25 ENCOUNTER — Ambulatory Visit: Payer: Medicare Other | Admitting: Family Medicine

## 2017-03-01 ENCOUNTER — Ambulatory Visit (INDEPENDENT_AMBULATORY_CARE_PROVIDER_SITE_OTHER): Payer: Medicare Other | Admitting: Family Medicine

## 2017-03-01 ENCOUNTER — Encounter: Payer: Self-pay | Admitting: Family Medicine

## 2017-03-01 VITALS — BP 138/71 | HR 86 | Ht 68.0 in | Wt 190.6 lb

## 2017-03-01 DIAGNOSIS — K219 Gastro-esophageal reflux disease without esophagitis: Secondary | ICD-10-CM | POA: Diagnosis not present

## 2017-03-01 DIAGNOSIS — I10 Essential (primary) hypertension: Secondary | ICD-10-CM

## 2017-03-01 DIAGNOSIS — E782 Mixed hyperlipidemia: Secondary | ICD-10-CM

## 2017-03-01 DIAGNOSIS — Z87891 Personal history of nicotine dependence: Secondary | ICD-10-CM | POA: Diagnosis not present

## 2017-03-01 DIAGNOSIS — E0822 Diabetes mellitus due to underlying condition with diabetic chronic kidney disease: Secondary | ICD-10-CM | POA: Diagnosis not present

## 2017-03-01 DIAGNOSIS — E1169 Type 2 diabetes mellitus with other specified complication: Secondary | ICD-10-CM | POA: Insufficient documentation

## 2017-03-01 DIAGNOSIS — E1159 Type 2 diabetes mellitus with other circulatory complications: Secondary | ICD-10-CM

## 2017-03-01 DIAGNOSIS — N183 Chronic kidney disease, stage 3 unspecified: Secondary | ICD-10-CM

## 2017-03-01 DIAGNOSIS — E663 Overweight: Secondary | ICD-10-CM | POA: Diagnosis not present

## 2017-03-01 DIAGNOSIS — Z23 Encounter for immunization: Secondary | ICD-10-CM | POA: Diagnosis not present

## 2017-03-01 DIAGNOSIS — R05 Cough: Secondary | ICD-10-CM

## 2017-03-01 DIAGNOSIS — E119 Type 2 diabetes mellitus without complications: Secondary | ICD-10-CM | POA: Diagnosis not present

## 2017-03-01 DIAGNOSIS — G479 Sleep disorder, unspecified: Secondary | ICD-10-CM

## 2017-03-01 DIAGNOSIS — E559 Vitamin D deficiency, unspecified: Secondary | ICD-10-CM | POA: Diagnosis not present

## 2017-03-01 DIAGNOSIS — I152 Hypertension secondary to endocrine disorders: Secondary | ICD-10-CM

## 2017-03-01 DIAGNOSIS — R059 Cough, unspecified: Secondary | ICD-10-CM | POA: Insufficient documentation

## 2017-03-01 MED ORDER — OMEPRAZOLE 20 MG PO CPDR: 20.0000 mg | DELAYED_RELEASE_CAPSULE | Freq: Every day | ORAL | 1 refills | Status: DC

## 2017-03-01 MED ORDER — FLUTICASONE PROPIONATE 50 MCG/ACT NA SUSP: 1.0000 | Freq: Two times a day (BID) | NASAL | 6 refills | Status: DC

## 2017-03-01 MED ORDER — METFORMIN HCL 500 MG PO TABS
500.0000 mg | ORAL_TABLET | Freq: Every day | ORAL | 1 refills | Status: DC
Start: 2017-03-01 — End: 2017-03-05

## 2017-03-01 MED ORDER — LISINOPRIL 2.5 MG PO TABS: 2.5000 mg | ORAL_TABLET | Freq: Every day | ORAL | 1 refills | Status: DC

## 2017-03-01 MED ORDER — LOVASTATIN 40 MG PO TABS: 40.0000 mg | ORAL_TABLET | Freq: Every day | ORAL | 1 refills | Status: DC

## 2017-03-01 NOTE — Progress Notes (Signed)
Impression and Recommendations:    1. Type 2 diabetes mellitus without complication, without long-term current use of insulin (HCC)   2. Stage 3 chronic kidney disease ([place])   3. Mixed hyperlipidemia   4. Sleep disorder   5. Overweight (BMI 25.0-29.9)   6. Hypertension associated with diabetes ([place])   7. 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, without long-term current use of insulin ([place])   8. Mixed diabetic hyperlipidemia associated with type 2 diabetes mellitus ([place])   9. Cough in adult at night primarily when lying down   10. History of tobacco abuse   11. Vitamin D deficiency   12. Gastroesophageal reflux disease, esophagitis presence not specified    - Old labs reviewed and cholesterol, A1c, kidney function etc. are all within acceptable limits and are well controlled.  Refill of all his chronic meds given today  Ayr or Milta Deiters med sinus rinse.  Please let me know if using the Flonase over the next month or so does not help resolve that postnasal drip you get when you're mostly at night lying down.  As discussed do not meet criteria for low-dose CT scan however, it's something we can consider in the future if needed.  Since you had a nurse come to your house that was likely your Medicare wellness yrly exam where they asked you questions if you're safe at home, risk for falling etc.  So in the future make an appointment for a complete physical exam with me.    If you would like call your insurance and asked them how to get an actual physical exam from your doctor  The patient was counseled, risk factors were discussed, anticipatory guidance given.   New Prescriptions   FLUTICASONE (FLONASE) 50 MCG/ACT NASAL SPRAY    Place 1 spray into both nostrils 2 (two) times daily. After sinus rinse    Discontinued Medications   DUREZOL 0.05 % EMUL    INSTILL 1 DROP INTO LEFT EYE 3 TIMES A DAY   HYDROXYZINE (ATARAX/VISTARIL) 50 MG TABLET     Take 1 tablet (50 mg total) by mouth every 6 (six) hours as needed for itching.   LIVER OIL-ZINC OXIDE (DESITIN) 40 % OINTMENT    Apply 1 application topically as needed for irritation. Apply after each diaper/pad change   MICONAZOLE (MICATIN) 2 % CREAM    Apply 1 application topically 2 (two) times daily. Use for 2 months or until rash completely gone   MOXIFLOXACIN (VIGAMOX) 0.5 % OPHTHALMIC SOLUTION    INSTILL 1 DROP INTO LEFT EYE 4 TIMES A DAY   PROLENSA 0.07 % SOLN    USE 1 DROP IN LEFT EYE AT BEDTIME   TRAMADOL (ULTRAM) 50 MG TABLET    Take 1 tablet by mouth every 6 (six) hours as needed.   TRIAMCINOLONE CREAM (KENALOG) 0.1 %    Apply 1 application topically 2 (two) times daily. Do not apply to fungal areas- groin, buttocks etc    Modified Medications   Modified Medication Previous Medication   LISINOPRIL (PRINIVIL,ZESTRIL) 2.5 MG TABLET lisinopril (PRINIVIL,ZESTRIL) 2.5 MG tablet      Take 1 tablet (2.5 mg total) by mouth daily.    Take 1 tablet (2.5 mg total) by mouth daily.   LOVASTATIN (MEVACOR) 40 MG TABLET lovastatin (MEVACOR) 40 MG tablet      Take 1 tablet (40 mg total) by mouth daily.    Take 1 tablet (40 mg  total) by mouth daily.   METFORMIN (GLUCOPHAGE) 500 MG TABLET metFORMIN (GLUCOPHAGE) 500 MG tablet      Take 1 tablet (500 mg total) by mouth daily.    Take 1 tablet (500 mg total) by mouth daily.   OMEPRAZOLE (PRILOSEC) 20 MG CAPSULE omeprazole (PRILOSEC) 20 MG capsule      Take 1 capsule (20 mg total) by mouth daily.    Take 1 capsule (20 mg total) by mouth daily.     Meds ordered this encounter  Medications  . aspirin EC 81 MG tablet    Sig: Take 81 mg by mouth daily.  . fluticasone (FLONASE) 50 MCG/ACT nasal spray    Sig: Place 1 spray into both nostrils 2 (two) times daily. After sinus rinse    Dispense:  16 g    Refill:  6  . omeprazole (PRILOSEC) 20 MG capsule    Sig: Take 1 capsule (20 mg total) by mouth daily.    Dispense:  90 capsule    Refill:  1  .  lovastatin (MEVACOR) 40 MG tablet    Sig: Take 1 tablet (40 mg total) by mouth daily.    Dispense:  90 tablet    Refill:  1  . lisinopril (PRINIVIL,ZESTRIL) 2.5 MG tablet    Sig: Take 1 tablet (2.5 mg total) by mouth daily.    Dispense:  90 tablet    Refill:  1  . metFORMIN (GLUCOPHAGE) 500 MG tablet    Sig: Take 1 tablet (500 mg total) by mouth daily.    Dispense:  90 tablet    Refill:  1     No orders of the defined types were placed in this encounter.    Gross side effects, risk and benefits, and alternatives of medications and treatment plan in general discussed with patient.  Patient is aware that all medications have potential side effects and we are unable to predict every side effect or drug-drug interaction that may occur.   Patient will call with any questions prior to using medication if they have concerns.  Expresses verbal understanding and consents to current therapy and treatment regimen.  No barriers to understanding were identified.  Red flag symptoms and signs discussed in detail.  Patient expressed understanding regarding what to do in case of emergency\urgent symptoms  Please see AVS handed out to patient at the end of our visit for further patient instructions/ counseling done pertaining to today's office visit.   Return in about 4 months (around 07/02/2017) for cpe near future..     Note: This document was prepared using Dragon voice recognition software and may include unintentional dictation errors.  [name] 11:05 AM --------------------------------------------------------------------------------------------------------------------------------------------------------------------------------------------------------------------------------------------    Subjective:    CC:  Chief Complaint  Patient presents with  . Follow-up    HPI: Alan Hurst is a 71 y.o. male who presents to [facility] today for issues as  discussed below.  Patient is here for follow-up of his chronic problems and discussion of recent labs done on 10\15\18.   - Acute compliant: Cough mostly at Night when lying down ever since sx.  Taking gerd meds daily.  No congestion or PND.   Denies heartburn.   No SOB, feels some congestion in chest and that he has to clear his throat.  DM HPI: A1c 5.7 just 7 days ago.  This is improved from 6.2 just 12 months ago.-   -  He has been working on  diet and exercise for diabetes  Pt is currently maintained on the following medications for diabetes:   see med list today  Medication compliance - good; metformin 500 once daily.  Home glucose readings range not checking   Denies polyuria/polydipsia.  Denies hypo/ hyperglycemia symptoms   Pancreatitis:    Never  Last diabetic eye exam was No results found for: HMDIABEYEEXA  Foot exam- UTD  Last A1C in the office was:  Lab Results  Component Value Date   HGBA1C 5.7 (H) 02/22/2017   HGBA1C 5.9 05/26/2016   HGBA1C 6.2 03/02/2016    Lab Results  Component Value Date   MICROALBUR <0.2 12/26/2015   LDLCALC 62 02/22/2017   CREATININE 1.03 02/22/2017      Wt Readings from Last 3 Encounters:  03/01/17 190 lb 9.6 oz (86.5 kg)  10/26/16 179 lb 12 oz (81.5 kg)  09/28/16 181 lb 8 oz (82.3 kg)    BP Readings from Last 3 Encounters:  03/01/17 138/71  10/26/16 105/71  09/28/16 108/71    Pulse Readings from Last 3 Encounters:  03/01/17 86  10/26/16 96  09/28/16 100    1. HTN HPI:  -  His blood pressure has been controlled at home.   - Patient reports good compliance with blood pressure medications  - Denies medication S-E   - Smoking Status noted   - He denies new onset of: chest pain, exercise intolerance, shortness of breath, dizziness, visual changes, headache, lower extremity swelling or claudication.   Today their BP is BP: 138/71   Last 3 blood pressure readings in our office are as follows: BP Readings from  Last 3 Encounters:  03/01/17 138/71  10/26/16 105/71  09/28/16 108/71     Filed Weights   03/01/17 1032  Weight: 190 lb 9.6 oz (86.5 kg)      CHOL HPI:   -  He  is currently managed with:  See med list from today  Txmnt compliance- good  Patient reports very little compliance with low chol/ saturated and trans fat diet.  No exercise  RUQ pain- n    Muscle aches- n  No other s-e  Last lipid panel as follows:  Lab Results  Component Value Date   CHOL 132 02/22/2017   HDL 49 02/22/2017   LDLCALC 62 02/22/2017   TRIG 104 02/22/2017   CHOLHDL 2.7 02/22/2017    Hepatic Function Latest Ref Rng & Units 02/22/2017 09/28/2016 05/26/2016  Total Protein 6.0 - 8.5 g/dL 6.3 - 6.7  Albumin 3.5 - 4.8 g/dL 4.2 - 4.1  AST 0 - 40 IU/L 17 20 20   ALT 0 - 44 IU/L 13 12 16   Alk Phosphatase 39 - 117 IU/L 70 - 57  Total Bilirubin 0.0 - 1.2 mg/dL 0.3 - 0.4       Wt Readings from Last 3 Encounters:  03/01/17 190 lb 9.6 oz (86.5 kg)  10/26/16 179 lb 12 oz (81.5 kg)  09/28/16 181 lb 8 oz (82.3 kg)   BP Readings from Last 3 Encounters:  03/01/17 138/71  10/26/16 105/71  09/28/16 108/71   Pulse Readings from Last 3 Encounters:  03/01/17 86  10/26/16 96  09/28/16 100   BMI Readings from Last 3 Encounters:  03/01/17 28.98 kg/m  10/26/16 27.33 kg/m  09/28/16 27.60 kg/m     Patient Care Team    Relationship Specialty Notifications Start End  [name], DO PCP - General Family Medicine  11/21/15   Christain [place], [place] Referring  Physician Optometry  03/02/16    Comment: Blanche East, MD Consulting Physician Dermatology  10/26/16      Patient Active Problem List   Diagnosis Date Noted  . 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, without long-term current use of insulin ([place]) 03/01/2017    Priority: High  . Mixed diabetic hyperlipidemia associated with type 2 diabetes mellitus ([place]) 03/01/2017    Priority: High  .  Hypertension associated with diabetes ([place]) 03/01/2017    Priority: High  . Type 2 diabetes mellitus without complication, without long-term current use of insulin ([place]) 12/05/2015    Priority: High  . Overweight (BMI 25.0-29.9) 03/02/2016    Priority: Medium  . Chronic kidney disease- stage 3a 01/02/2016    Priority: Medium  . HLD (hyperlipidemia) 12/05/2015    Priority: Medium  . Recovering alcoholic in remission ([place]) 12/05/2015    Priority: Medium  . History of tobacco abuse 12/05/2015    Priority: Medium  . GERD (gastroesophageal reflux disease) 12/05/2015    Priority: Low  . Sleep disorder 12/05/2015    Priority: Low  . Vitamin D insufficiency 12/05/2015    Priority: Low  . Vitamin B deficiency 12/05/2015    Priority: Low  . Cough in adult at night primarily when lying down 03/01/2017  . Vitamin D deficiency 03/01/2017  . Rash and nonspecific skin eruption 09/22/2016  . 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 Fall   . 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 Essential hypertension   . Femur fracture ([place]) 08/04/2016  . 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    Current Meds  Medication Sig  . aspirin EC 81 MG tablet Take 81 mg by mouth daily.  . B Complex Vitamins (VITAMIN-B COMPLEX) TABS Take 1 tablet by mouth daily.  . Cholecalciferol (VITAMIN D) 2000 units tablet Take 1 tablet by mouth daily.  Marland Kitchen lisinopril (PRINIVIL,ZESTRIL) 2.5 MG tablet Take 1 tablet (2.5 mg total) by mouth daily.  Marland Kitchen lovastatin (MEVACOR) 40 MG tablet Take 1 tablet (40 mg total) by mouth daily.  . magnesium oxide (MAG-OX) 400 MG tablet Take 400 mg by mouth daily.  . Melatonin 1 MG TABS Take 3 tablets by mouth at bedtime.  . metFORMIN (GLUCOPHAGE) 500 MG  tablet Take 1 tablet (500 mg total) by mouth daily.  . Multiple Vitamin (MULTIVITAMIN) tablet Take 1 tablet by mouth daily.  . Omega-3 Fatty Acids (FISH OIL) 1000 MG CAPS Take 1 capsule by mouth daily.  Marland Kitchen omeprazole (PRILOSEC) 20 MG capsule Take 1 capsule (20 mg total) by mouth daily.  . [DISCONTINUED] DUREZOL 0.05 % EMUL INSTILL 1 DROP INTO LEFT EYE 3 TIMES A DAY  . [DISCONTINUED] hydrOXYzine (ATARAX/VISTARIL) 50 MG tablet Take 1 tablet (50 mg total) by mouth every 6 (six) hours as needed for itching.  . [DISCONTINUED] lisinopril (PRINIVIL,ZESTRIL) 2.5 MG tablet Take 1 tablet (2.5 mg total) by mouth daily.  . [DISCONTINUED] liver oil-zinc oxide (DESITIN) 40 % ointment Apply 1 application topically as needed for irritation. Apply after each diaper/pad change  . [DISCONTINUED] lovastatin (MEVACOR) 40 MG tablet Take 1 tablet (40 mg total) by mouth daily.  . [DISCONTINUED] metFORMIN (GLUCOPHAGE) 500 MG tablet Take 1 tablet (500 mg total) by mouth daily.  . [DISCONTINUED] miconazole (  MICATIN) 2 % cream Apply 1 application topically 2 (two) times daily. Use for 2 months or until rash completely gone  . [DISCONTINUED] moxifloxacin (VIGAMOX) 0.5 % ophthalmic solution INSTILL 1 DROP INTO LEFT EYE 4 TIMES A DAY  . [DISCONTINUED] omeprazole (PRILOSEC) 20 MG capsule Take 1 capsule (20 mg total) by mouth daily.  . [DISCONTINUED] PROLENSA 0.07 % SOLN USE 1 DROP IN LEFT EYE AT BEDTIME  . [DISCONTINUED] traMADol (ULTRAM) 50 MG tablet Take 1 tablet by mouth every 6 (six) hours as needed.  . [DISCONTINUED] triamcinolone cream (KENALOG) 0.1 % Apply 1 application topically 2 (two) times daily. Do not apply to fungal areas- groin, buttocks etc    Allergies:  Allergies  Allergen Reactions  . No Known Allergies      Review of Systems: General:   Denies fever, chills, unexplained weight loss.  Optho/Auditory:   Denies visual changes, blurred vision/LOV Respiratory:   Denies wheeze, DOE more than baseline  levels.  Cardiovascular:   Denies chest pain, palpitations, new onset peripheral edema  Gastrointestinal:   Denies nausea, vomiting, diarrhea, abd pain.  Genitourinary: Denies dysuria, freq/ urgency, flank pain or discharge from genitals.  Endocrine:     Denies hot or cold intolerance, polyuria, polydipsia. Musculoskeletal:   Denies unexplained myalgias, joint swelling, unexplained arthralgias, gait problems.  Skin:  Denies new onset rash, suspicious lesions Neurological:     Denies dizziness, unexplained weakness, numbness  Psychiatric/Behavioral:   Denies mood changes, suicidal or homicidal ideations, hallucinations    Objective:   Blood pressure 138/71, pulse 86, height 5' 8"  (1.727 m), weight 190 lb 9.6 oz (86.5 kg). Body mass index is 28.98 kg/m. General:  Well Developed, well nourished, appropriate for stated age.  Neuro:  Alert and oriented,  extra-ocular muscles intact  HEENT:  Normocephalic, atraumatic, neck supple, no carotid bruits appreciated  Skin:  no gross rash, warm, pink. Cardiac:  RRR, S1 S2 Respiratory:  ECTA B/L and A/P, Not using accessory muscles, speaking in full sentences- unlabored. Vascular:  Ext warm, no cyanosis apprec.; cap RF less 2 sec. Psych:  No HI/SI, judgement and insight good, Euthymic mood. Full Affect.

## 2017-03-01 NOTE — Addendum Note (Signed)
Addended by: Lanier Prude D on: 03/01/2017 11:28 AM   Modules accepted: Orders

## 2017-03-01 NOTE — Patient Instructions (Addendum)
Ayr or Milta Deiters med sinus rinse.  Please let me know if using the Flonase over the next month or so does not help resolve that postnasal drip you get when you're mostly at night lying down.  As discussed do not meet criteria for low-dose CT scan however, it's something we can consider in the future if needed.  Since you had a nurse come to your house that was likely your Medicare wellness yrly exam where they asked you questions if you're safe at home, risk for falling etc.  So in the future make an appointment for a complete physical exam with me.    If you would like call your insurance and asked them how to get an actual physical exam from your doctor   Food Choices for Gastroesophageal Reflux Disease, Adult When you have gastroesophageal reflux disease (GERD), the foods you eat and your eating habits are very important. Choosing the right foods can help ease your discomfort. What guidelines do I need to follow?  Choose fruits, vegetables, whole grains, and low-fat dairy products.  Choose low-fat meat, fish, and poultry.  Limit fats such as oils, salad dressings, butter, nuts, and avocado.  Keep a food diary. This helps you identify foods that cause symptoms.  Avoid foods that cause symptoms. These may be different for everyone.  Eat small meals often instead of 3 large meals a day.  Eat your meals slowly, in a place where you are relaxed.  Limit fried foods.  Cook foods using methods other than frying.  Avoid drinking alcohol.  Avoid drinking large amounts of liquids with your meals.  Avoid bending over or lying down until 2-3 hours after eating. What foods are not recommended? These are some foods and drinks that may make your symptoms worse: Vegetables Tomatoes. Tomato juice. Tomato and spaghetti sauce. Chili peppers. Onion and garlic. Horseradish. Fruits Oranges, grapefruit, and lemon (fruit and juice). Meats High-fat meats, fish, and poultry. This includes hot dogs, ribs,  ham, sausage, salami, and bacon. Dairy Whole milk and chocolate milk. Sour cream. Cream. Butter. Ice cream. Cream cheese. Drinks Coffee and tea. Bubbly (carbonated) drinks or energy drinks. Condiments Hot sauce. Barbecue sauce. Sweets/Desserts Chocolate and cocoa. Donuts. Peppermint and spearmint. Fats and Oils High-fat foods. This includes [place] fries and potato chips. Other Vinegar. Strong spices. This includes black pepper, white pepper, red pepper, cayenne, curry powder, cloves, ginger, and chili powder. The items listed above may not be a complete list of foods and drinks to avoid. Contact your dietitian for more information. This information is not intended to replace advice given to you by your health care provider. Make sure you discuss any questions you have with your health care provider. Document Released: 10/27/2011 Document Revised: 10/03/2015 Document Reviewed: 03/01/2013 Elsevier Interactive Patient Education  2017 [employer].

## 2017-03-04 ENCOUNTER — Encounter: Payer: Self-pay | Admitting: Family Medicine

## 2017-03-05 ENCOUNTER — Telehealth: Payer: Self-pay | Admitting: Family Medicine

## 2017-03-05 ENCOUNTER — Other Ambulatory Visit: Payer: Self-pay

## 2017-03-05 DIAGNOSIS — N183 Chronic kidney disease, stage 3 unspecified: Secondary | ICD-10-CM

## 2017-03-05 DIAGNOSIS — K219 Gastro-esophageal reflux disease without esophagitis: Secondary | ICD-10-CM

## 2017-03-05 DIAGNOSIS — E119 Type 2 diabetes mellitus without complications: Secondary | ICD-10-CM

## 2017-03-05 DIAGNOSIS — E782 Mixed hyperlipidemia: Secondary | ICD-10-CM

## 2017-03-05 MED ORDER — OMEPRAZOLE 20 MG PO CPDR: 20.0000 mg | DELAYED_RELEASE_CAPSULE | Freq: Every day | ORAL | 1 refills | Status: DC

## 2017-03-05 MED ORDER — LOVASTATIN 40 MG PO TABS: 40.0000 mg | ORAL_TABLET | Freq: Every day | ORAL | 1 refills | Status: DC

## 2017-03-05 MED ORDER — METFORMIN HCL 500 MG PO TABS: 500.0000 mg | ORAL_TABLET | Freq: Every day | ORAL | 1 refills | Status: DC

## 2017-03-05 MED ORDER — LISINOPRIL 2.5 MG PO TABS: 2.5000 mg | ORAL_TABLET | Freq: Every day | ORAL | 1 refills | Status: DC

## 2017-03-05 NOTE — Telephone Encounter (Signed)
Corrected the issue and resent medications to pharmacy. Patient notified.  MPulliam, CMA/RT(R)

## 2017-03-05 NOTE — Telephone Encounter (Signed)
Patient called states only the Fluticasone (nasal spray was to go to [facility] ).  --- All other Rx(s) should have been sent to [facility] for 90 day supply:   1) Lisinopril 2.5 MG tablets (takes 1 daily by mouth)    2)Lovastatin 40 MG tablets (takes 1 daily by mouth)       3) Metformin 500 MG tablets (takes 1 daily by mouth)       4)Prilocec 20 MG capsules  (takes 1 daily by mouth)  Patient states did not collect any of the Rx except for the Fluticasone(nasal spray)--he will wait for [facility] to send the others via mail order.  --glh

## 2017-03-05 NOTE — Telephone Encounter (Signed)
Patient called states that refills from last office visit was suppose to be sent to [facility].  I resent all refills to [facility] and canceled ones at [facility]  Patient notified. MPulliam, CMA/RT(R)

## 2017-03-25 ENCOUNTER — Ambulatory Visit (INDEPENDENT_AMBULATORY_CARE_PROVIDER_SITE_OTHER): Payer: Medicare Other | Admitting: Family Medicine

## 2017-03-25 ENCOUNTER — Encounter: Payer: Self-pay | Admitting: Family Medicine

## 2017-03-25 VITALS — BP 132/86 | HR 90 | Ht 68.0 in | Wt 193.3 lb

## 2017-03-25 DIAGNOSIS — Z719 Counseling, unspecified: Secondary | ICD-10-CM

## 2017-03-25 DIAGNOSIS — Z23 Encounter for immunization: Secondary | ICD-10-CM

## 2017-03-25 DIAGNOSIS — E119 Type 2 diabetes mellitus without complications: Secondary | ICD-10-CM

## 2017-03-25 DIAGNOSIS — N183 Chronic kidney disease, stage 3 unspecified: Secondary | ICD-10-CM

## 2017-03-25 DIAGNOSIS — Z0000 Encounter for general adult medical examination without abnormal findings: Secondary | ICD-10-CM

## 2017-03-25 DIAGNOSIS — E0822 Diabetes mellitus due to underlying condition with diabetic chronic kidney disease: Secondary | ICD-10-CM

## 2017-03-25 DIAGNOSIS — J309 Allergic rhinitis, unspecified: Secondary | ICD-10-CM

## 2017-03-25 DIAGNOSIS — Z1211 Encounter for screening for malignant neoplasm of colon: Secondary | ICD-10-CM

## 2017-03-25 DIAGNOSIS — Z7984 Long term (current) use of oral hypoglycemic drugs: Secondary | ICD-10-CM

## 2017-03-25 LAB — POCT UA - MICROALBUMIN
Albumin/Creatinine Ratio, Urine, POC: <30
CREATININE, POC: 200 mg/dL
MICROALBUMIN (UR) POC: 10 mg/L

## 2017-03-25 MED ORDER — ZOSTER VAC RECOMB ADJUVANTED 50 MCG/0.5ML IM SUSR: 0.5000 mL | Freq: Once | INTRAMUSCULAR | 0 refills | Status: AC

## 2017-03-25 MED ORDER — FLUTICASONE PROPIONATE 50 MCG/ACT NA SUSP: 1.0000 | Freq: Two times a day (BID) | NASAL | 6 refills | Status: DC

## 2017-03-25 NOTE — Progress Notes (Signed)
Male physical  Impression and Recommendations:    1. Encounter for wellness examination   2. Type 2 diabetes mellitus without complication, without long-term current use of insulin (HCC)   3. Health education/counseling   4. 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 Allergic rhinitis, unspecified seasonality, unspecified trigger   6. Need for Tdap vaccination   7. Need for zoster vaccination   8. Screening for colon cancer    Orders Placed This Encounter  Procedures   Tdap vaccine greater than or equal to 7yo IM   Ambulatory referral to Gastroenterology    Referral Priority:   Routine    Referral Type:   Consultation    Referral Reason:   Specialty Services Required    Number of Visits Requested:   1   HM Diabetes Foot Exam     No problem-specific Assessment & Plan notes found for this encounter.      Medication List         Accurate as of 03/25/17  2:31 PM. Always use your most recent med list.            aspirin EC 81 MG tablet   Fish Oil 1000 MG Caps   fluticasone 50 MCG/ACT nasal spray Commonly known as:  FLONASE Place 1 spray 2 (two) times daily into both nostrils. After sinus rinse   Iron 240 (27 Fe) MG Tabs   lisinopril 2.5 MG tablet Commonly known as:  PRINIVIL,ZESTRIL Take 1 tablet (2.5 mg total) by mouth daily.   lovastatin 40 MG tablet Commonly known as:  MEVACOR Take 1 tablet (40 mg total) by mouth daily.   magnesium oxide 400 MG tablet Commonly known as:  MAG-OX   Melatonin 1 MG Tabs   metFORMIN 500 MG tablet Commonly known as:  GLUCOPHAGE Take 1 tablet (500 mg total) by mouth daily.   multivitamin tablet   omeprazole 20 MG capsule Commonly known as:  PRILOSEC Take 1 capsule (20 mg total) by mouth daily.   Vitamin D 2000 units tablet   Vitamin-B Complex Tabs   Zoster Vaccine Adjuvanted injection Commonly known as:   SHINGRIX Inject 0.5 mLs once for 1 dose into the muscle.         Where to Get Your Medications     These medications were sent to [facility] - [place], [place] - [address]  [address], [place] [address]    Phone:  [phone]  fluticasone 50 MCG/ACT nasal spray Zoster Vaccine Adjuvanted injection      Please see AVS handed out to patient at the end of our visit for further patient instructions/ counseling done pertaining to today's office visit.  1) Anticipatory Guidance: Discussed importance of wearing a seatbelt while driving, not texting while driving;   sunscreen when outside along with skin surveillance; eating a balanced and modest diet; physical activity at least 25 minutes per day or 150 min/ week moderate to intense activity.  2) Immunizations / Screenings / Labs:   All immunizations are up-to-date per recommendations or will be updated today. Patient is due for dental and vision screens which pt will schedule independently. Will obtain CBC, CMP, HgA1c, Lipid panel, TSH and vit D when fasting, if not already done recently.   3) Weight:  BMI meaning discussed with patient.  Discussed goal of losing 5-10% of current body weight which would improve  overall feelings of well being and improve objective health data. Improve nutrient density of diet through increasing intake of fruits and vegetables and decreasing saturated fats, white flour products and refined sugars.    Gross side effects, risk and benefits, and alternatives of medications discussed with patient.  Patient is aware that all medications have potential side effects and we are unable to predict every side effect or drug-drug interaction that may occur.  Expresses verbal understanding and consents to current therapy plan and treatment regimen.  Follow-up preventative CPE in 1 year. Follow-up office visit pending lab work.  F/up sooner for chronic care management  and/or prn    Subjective:    CC: CPE  HPI: Alan Hurst is a 71 y.o. male who presents to [facility] today for a yearly health maintenance exam.     Health Maintenance Summary Reviewed and updated, unless pt declines services.  Aspirin: administering 81 mg daily Colonoscopy:     (Unnecessary secondary to < 6 or > 15 years old.) Tdap: Up to date: needs TD  Pneumovax/PPSV23:  Up to date: see Immunizations. Prevnar 13/PCV13:    To be administered at this encounter. Zostavax:    Postponed. Tobacco History Reviewed:    CT scan for screening lung CA:    Abdominal Ultrasound:     ( Unnecessary secondary to < 32 or > 62 years old) Alcohol:    No concerns, no excessive use Exercise Habits:    STD concerns:   none Drug Use:   None Birth control method:   n/a Testicular/penile concerns:        Health Maintenance  Topic Date Due   FOOT EXAM  10/26/2017 (Originally 02/15/1956)   OPHTHALMOLOGY EXAM  10/26/2017 (Originally 02/15/1956)   COLONOSCOPY  10/26/2017 (Originally 09/27/2014)   TETANUS/TDAP  10/26/2017 (Originally 02/14/1965)   Hepatitis C Screening  10/26/2028 (Originally [birth year])   HEMOGLOBIN A1C  08/23/2017   INFLUENZA VACCINE  Completed   PNA vac Low Risk Adult  Completed      Wt Readings from Last 3 Encounters:  03/25/17 193 lb 4.8 oz (87.7 kg)  03/01/17 190 lb 9.6 oz (86.5 kg)  10/26/16 179 lb 12 oz (81.5 kg)   BP Readings from Last 3 Encounters:  03/25/17 132/86  03/01/17 138/71  10/26/16 105/71   Pulse Readings from Last 3 Encounters:  03/25/17 90  03/01/17 86  10/26/16 96    Patient Active Problem List   Diagnosis Date Noted   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, without long-term current use of insulin (HCC) 03/01/2017    Priority: High   Mixed diabetic hyperlipidemia associated with type 2 diabetes mellitus (HCC) 03/01/2017    Priority: High   Hypertension associated with  diabetes (HCC) 03/01/2017    Priority: High   Type 2 diabetes mellitus without complication, without long-term current use of insulin (HCC) 12/05/2015    Priority: High   Overweight (BMI 25.0-29.9) 03/02/2016    Priority: Medium   Chronic kidney disease- stage 3a 01/02/2016    Priority: Medium   HLD (hyperlipidemia) 12/05/2015    Priority: Medium   Recovering alcoholic in remission (HCC) 12/05/2015    Priority: Medium   History of tobacco abuse 12/05/2015    Priority: Medium   GERD (gastroesophageal reflux disease) 12/05/2015    Priority: Low   Sleep disorder 12/05/2015    Priority: Low   Vitamin D insufficiency 12/05/2015    Priority:  Low   Vitamin B deficiency 12/05/2015    Priority: Low   Health education/counseling 03/25/2017   Allergic rhinitis 03/25/2017   Cough in adult at night primarily when lying down 03/01/2017   Vitamin D deficiency 03/01/2017   Rash and nonspecific skin eruption 09/22/2016   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   [facility]   Sleep disorder 12/05/2015   Vitamin B deficiency 12/05/2015   Vitamin D insufficiency 12/05/2015    Past Surgical History:  Procedure Laterality Date   FEMUR FRACTURE SURGERY     HIP FRACTURE SURGERY     JOINT REPLACEMENT Bilateral    hips   TIBIA FRACTURE SURGERY     TOTAL HIP REVISION Right 08/07/2016   Procedure: RIGHT TOTAL HIP REVISION;  Surgeon: Samson Frederic, MD;  Location: [facility] OR;  Service: Orthopedics;  Laterality: Right;     Family History  Problem Relation Age of Onset   Alcohol abuse Mother    Alcohol abuse Father    Congestive Heart Failure Father    Kidney disease Father     Social History   Substance and Sexual Activity  Drug Use No  ,  Social History   Substance and Sexual Activity  Alcohol Use No  ,  Social History   Tobacco Use  Smoking Status Former Smoker   Last attempt to quit: 05/11/1993   Years since quitting: 23.8  Smokeless Tobacco Never Used  ,  Social History   Substance and Sexual Activity  Sexual Activity Yes   Birth control/protection: None      Medication List         Accurate as of 03/25/17  2:31 PM. Always use your most recent med list.            aspirin EC 81 MG tablet   Fish Oil 1000 MG Caps   fluticasone 50 MCG/ACT nasal spray Commonly known as:  FLONASE Place 1 spray 2 (two) times daily into both nostrils. After sinus rinse   Iron 240 (27 Fe) MG Tabs   lisinopril 2.5 MG tablet Commonly known as:  PRINIVIL,ZESTRIL Take 1 tablet (2.5 mg total) by mouth daily.   lovastatin 40 MG tablet Commonly known as:  MEVACOR Take 1 tablet (40 mg total) by mouth daily.   magnesium oxide 400 MG tablet Commonly known as:  MAG-OX   Melatonin 1 MG Tabs   metFORMIN 500 MG tablet Commonly known as:  GLUCOPHAGE Take 1 tablet (500 mg total) by mouth daily.   multivitamin tablet   omeprazole 20 MG capsule Commonly known as:  PRILOSEC Take 1 capsule (20 mg total) by mouth daily.   Vitamin D 2000 units tablet   Vitamin-B Complex Tabs   Zoster Vaccine Adjuvanted injection Commonly known as:  SHINGRIX Inject 0.5 mLs once for 1 dose into the muscle.         Where to Get Your Medications     These medications were sent to [facility] [phone] -  [place],  - [address]  [address], [place] [address]    Phone:  [phone]  fluticasone 50 MCG/ACT nasal spray Zoster Vaccine Adjuvanted injection      No known allergies  Review of Systems: General:   Denies fever, chills, unexplained weight loss.  Optho/Auditory:   Denies visual changes, blurred vision/LOV Respiratory:   Denies SOB, DOE more than baseline levels.   Cardiovascular:   Denies chest pain, palpitations, new onset peripheral edema  Gastrointestinal:   Denies nausea, vomiting, diarrhea.  Genitourinary: Denies dysuria, freq/ urgency, flank pain or discharge from genitals.  Endocrine:     Denies hot or cold intolerance, polyuria, polydipsia. Musculoskeletal:   Denies unexplained myalgias, joint swelling, unexplained arthralgias, gait problems.  Skin:  Denies rash, suspicious lesions Neurological:     Denies dizziness, unexplained weakness, numbness  Psychiatric/Behavioral:   Denies mood changes, suicidal or homicidal ideations, hallucinations    Objective:    Blood pressure 132/86, pulse 90, height 5\' 8"  (1.727 m), weight 193 lb 4.8 oz (87.7 kg). Body mass index is 29.39 kg/m. General Appearance:    Alert, cooperative, no distress, appears stated age  Head:    Normocephalic, without obvious abnormality, atraumatic  Eyes:    PERRL, conjunctiva/corneas clear, EOM's intact, fundi    benign, both eyes  Ears:    Normal TM's and external ear canals, both ears  Nose:   Nares normal, septum midline, mucosa normal, no drainage    or sinus tenderness  Throat:   Lips w/o lesion, mucosa moist, and tongue normal; teeth and   gums normal  Neck:   Supple, symmetrical, trachea midline, no adenopathy;    thyroid:  no enlargement/tenderness/nodules; no carotid   bruit or JVD  Back:     Symmetric, no curvature, ROM normal, no CVA tenderness  Lungs:     Clear to auscultation bilaterally, respirations unlabored, no       Wh/ R/ R  Chest Wall:    No tenderness or gross deformity; normal excursion   Heart:    Regular rate and rhythm, S1 and S2 normal, no murmur, rub   or gallop  Abdomen:     Soft, non-tender, bowel sounds active all  four quadrants, NO   G/R/R, no masses, no organomegaly  Genitalia:    Ext genitalia: without lesion, no penile rash or discharge, no hernias appreciated   Rectal:    Normal tone, prostate WNL's and equal b/l, no tenderness; guaiac negative stool  Extremities:   Extremities normal, atraumatic, no cyanosis or gross edema  Pulses:   2+ and symmetric all extremities  Skin:   Warm, dry, Skin color, texture, turgor normal, no obvious rashes or lesions  M-Sk:   Ambulates * 4 w/o difficulty, no gross deformities, tone WNL  Neurologic:   CNII-XII intact, normal strength, sensation and reflexes    Throughout Psych:  No HI/SI, judgement and insight good, Euthymic mood. Full Affect.

## 2017-03-25 NOTE — Patient Instructions (Addendum)
Preventive Care for Adults, Male A healthy lifestyle and preventive care can promote health and wellness. Preventive health guidelines for men include the following key practices:  A routine yearly physical is a good way to check with your health care provider about your health and preventative screening. It is a chance to share any concerns and updates on your health and to receive a thorough exam.  Visit your dentist for a routine exam and preventative care every 6 months. Brush your teeth twice a day and floss once a day. Good oral hygiene prevents tooth decay and gum disease.  The frequency of eye exams is based on your age, health, family medical history, use of contact lenses, and other factors. Follow your health care provider's recommendations for frequency of eye exams.  Eat a healthy diet. Foods such as vegetables, fruits, whole grains, low-fat dairy products, and lean protein foods contain the nutrients you need without too many calories. Decrease your intake of foods high in solid fats, added sugars, and salt. Eat the right amount of calories for you.Get information about a proper diet from your health care provider, if necessary.  Regular physical exercise is one of the most important things you can do for your health. Most adults should get at least 150 minutes of moderate-intensity exercise (any activity that increases your heart rate and causes you to sweat) each week. In addition, most adults need muscle-strengthening exercises on 2 or more days a week.  Maintain a healthy weight. The body mass index (BMI) is a screening tool to identify possible weight problems. It provides an estimate of body fat based on height and weight. Your health care provider can find your BMI and can help you achieve or maintain a healthy weight.For adults 20 years and older:  A BMI below 18.5 is considered underweight.  A BMI of 18.5 to 24.9 is normal.  A BMI of 25 to 29.9 is considered  overweight.  A BMI of 30 and above is considered obese.  Maintain normal blood lipids and cholesterol levels by exercising and minimizing your intake of saturated fat. Eat a balanced diet with plenty of fruit and vegetables. Blood tests for lipids and cholesterol should begin at age 20 and be repeated every 5 years. If your lipid or cholesterol levels are high, you are over 50, or you are at high risk for heart disease, you may need your cholesterol levels checked more frequently.Ongoing high lipid and cholesterol levels should be treated with medicines if diet and exercise are not working.  If you smoke, find out from your health care provider how to quit. If you do not use tobacco, do not start.  Lung cancer screening is recommended for adults aged 55-80 years who are at high risk for developing lung cancer because of a history of smoking. A yearly low-dose CT scan of the lungs is recommended for people who have at least a 30-pack-year history of smoking and are a current smoker or have quit within the past 15 years. A pack year of smoking is smoking an average of 1 pack of cigarettes a day for 1 year (for example: 1 pack a day for 30 years or 2 packs a day for 15 years). Yearly screening should continue until the smoker has stopped smoking for at least 15 years. Yearly screening should be stopped for people who develop a health problem that would prevent them from having lung cancer treatment.  If you choose to drink alcohol, do not have more   than 2 drinks per day. One drink is considered to be 12 ounces (355 mL) of beer, 5 ounces (148 mL) of wine, or 1.5 ounces (44 mL) of liquor.  Avoid use of street drugs. Do not share needles with anyone. Ask for help if you need support or instructions about stopping the use of drugs.  High blood pressure causes heart disease and increases the risk of stroke. Your blood pressure should be checked at least every 1-2 years. Ongoing high blood pressure should be  treated with medicines, if weight loss and exercise are not effective.  If you are 45-79 years old, ask your health care provider if you should take aspirin to prevent heart disease.  Diabetes screening is done by taking a blood sample to check your blood glucose level after you have not eaten for a certain period of time (fasting). If you are not overweight and you do not have risk factors for diabetes, you should be screened once every 3 years starting at age 45. If you are overweight or obese and you are 40-70 years of age, you should be screened for diabetes every year as part of your cardiovascular risk assessment.  Colorectal cancer can be detected and often prevented. Most routine colorectal cancer screening begins at the age of 50 and continues through age 75. However, your health care provider may recommend screening at an earlier age if you have risk factors for colon cancer. On a yearly basis, your health care provider may provide home test kits to check for hidden blood in the stool. Use of a small camera at the end of a tube to directly examine the colon (sigmoidoscopy or colonoscopy) can detect the earliest forms of colorectal cancer. Talk to your health care provider about this at age 50, when routine screening begins. Direct exam of the colon should be repeated every 5-10 years through age 75, unless early forms of precancerous polyps or small growths are found.  People who are at an increased risk for hepatitis B should be screened for this virus. You are considered at high risk for hepatitis B if:  You were born in a country where hepatitis B occurs often. Talk with your health care provider about which countries are considered high risk.  Your parents were born in a high-risk country and you have not received a shot to protect against hepatitis B (hepatitis B vaccine).  You have HIV or AIDS.  You use needles to inject street drugs.  You live with, or have sex with, someone who  has hepatitis B.  You are a man who has sex with other men (MSM).  You get hemodialysis treatment.  You take certain medicines for conditions such as cancer, organ transplantation, and autoimmune conditions.  Hepatitis C blood testing is recommended for all people born from 1945 through 1965 and any individual with known risks for hepatitis C.  Practice safe sex. Use condoms and avoid high-risk sexual practices to reduce the spread of sexually transmitted infections (STIs). STIs include gonorrhea, chlamydia, syphilis, trichomonas, herpes, HPV, and human immunodeficiency virus (HIV). Herpes, HIV, and HPV are viral illnesses that have no cure. They can result in disability, cancer, and death.  If you are a man who has sex with other men, you should be screened at least once per year for:  HIV.  Urethral, rectal, and pharyngeal infection of gonorrhea, chlamydia, or both.  If you are at risk of being infected with HIV, it is recommended that you take a   a prescription medicine daily to prevent HIV infection. This is called preexposure prophylaxis (PrEP). You are considered at risk if:  You are a man who has sex with other men (MSM) and have other risk factors.  You are a heterosexual man, are sexually active, and are at increased risk for HIV infection.  You take drugs by injection.  You are sexually active with a partner who has HIV.  Talk with your health care provider about whether you are at high risk of being infected with HIV. If you choose to begin PrEP, you should first be tested for HIV. You should then be tested every 3 months for as long as you are taking PrEP.  A one-time screening for abdominal aortic aneurysm (AAA) and surgical repair of large AAAs by ultrasound are recommended for men ages 31 to 16 years who are current or former smokers.  Healthy men should no longer receive prostate-specific antigen (PSA) blood tests as part of routine cancer screening. Talk with your health  care provider about prostate cancer screening.  Testicular cancer screening is not recommended for adult males who have no symptoms. Screening includes self-exam, a health care provider exam, and other screening tests. Consult with your health care provider about any symptoms you have or any concerns you have about testicular cancer.  Use sunscreen. Apply sunscreen liberally and repeatedly throughout the day. You should seek shade when your shadow is shorter than you. Protect yourself by wearing long sleeves, pants, a wide-brimmed hat, and sunglasses year round, whenever you are outdoors.  Once a month, do a whole-body skin exam, using a mirror to look at the skin on your back. Tell your health care provider about new moles, moles that have irregular borders, moles that are larger than a pencil eraser, or moles that have changed in shape or color.  Stay current with required vaccines (immunizations).  Influenza vaccine. All adults should be immunized every year.  Tetanus, diphtheria, and acellular pertussis (Td, Tdap) vaccine. An adult who has not previously received Tdap or who does not know his vaccine status should receive 1 dose of Tdap. This initial dose should be followed by tetanus and diphtheria toxoids (Td) booster doses every 10 years. Adults with an unknown or incomplete history of completing a 3-dose immunization series with Td-containing vaccines should begin or complete a primary immunization series including a Tdap dose. Adults should receive a Td booster every 10 years.  Varicella vaccine. An adult without evidence of immunity to varicella should receive 2 doses or a second dose if he has previously received 1 dose.  Human papillomavirus (HPV) vaccine. Males aged 11-21 years who have not received the vaccine previously should receive the 3-dose series. Males aged 22-26 years may be immunized. Immunization is recommended through the age of 45 years for any male who has sex with males  and did not get any or all doses earlier. Immunization is recommended for any person with an immunocompromised condition through the age of 31 years if he did not get any or all doses earlier. During the 3-dose series, the second dose should be obtained 4-8 weeks after the first dose. The third dose should be obtained 24 weeks after the first dose and 16 weeks after the second dose.  Zoster vaccine. One dose is recommended for adults aged 26 years or older unless certain conditions are present.  Measles, mumps, and rubella (MMR) vaccine. Adults born before [birth year] generally are considered immune to measles and mumps. Adults born in  1957 or later should have 1 or more doses of MMR vaccine unless there is a contraindication to the vaccine or there is laboratory evidence of immunity to each of the three diseases. A routine second dose of MMR vaccine should be obtained at least 28 days after the first dose for students attending postsecondary schools, health care workers, or international travelers. People who received inactivated measles vaccine or an unknown type of measles vaccine during 1963-1967 should receive 2 doses of MMR vaccine. People who received inactivated mumps vaccine or an unknown type of mumps vaccine before 1979 and are at high risk for mumps infection should consider immunization with 2 doses of MMR vaccine. Unvaccinated health care workers born before [birth year] who lack laboratory evidence of measles, mumps, or rubella immunity or laboratory confirmation of disease should consider measles and mumps immunization with 2 doses of MMR vaccine or rubella immunization with 1 dose of MMR vaccine.  Pneumococcal 13-valent conjugate (PCV13) vaccine. When indicated, a person who is uncertain of his immunization history and has no record of immunization should receive the PCV13 vaccine. All adults 42 years of age and older should receive this vaccine. An adult aged 90 years or older who has certain medical  conditions and has not been previously immunized should receive 1 dose of PCV13 vaccine. This PCV13 should be followed with a dose of pneumococcal polysaccharide (PPSV23) vaccine. Adults who are at high risk for pneumococcal disease should obtain the PPSV23 vaccine at least 8 weeks after the dose of PCV13 vaccine. Adults older than 71 years of age who have normal immune system function should obtain the PPSV23 vaccine dose at least 1 year after the dose of PCV13 vaccine.  Pneumococcal polysaccharide (PPSV23) vaccine. When PCV13 is also indicated, PCV13 should be obtained first. All adults aged 70 years and older should be immunized. An adult younger than age 71 years who has certain medical conditions should be immunized. Any person who resides in a nursing home or long-term care facility should be immunized. An adult smoker should be immunized. People with an immunocompromised condition and certain other conditions should receive both PCV13 and PPSV23 vaccines. People with human immunodeficiency virus (HIV) infection should be immunized as soon as possible after diagnosis. Immunization during chemotherapy or radiation therapy should be avoided. Routine use of PPSV23 vaccine is not recommended for American Indians, [place] Natives, or people younger than 65 years unless there are medical conditions that require PPSV23 vaccine. When indicated, people who have unknown immunization and have no record of immunization should receive PPSV23 vaccine. One-time revaccination 5 years after the first dose of PPSV23 is recommended for people aged 19-64 years who have chronic kidney failure, nephrotic syndrome, asplenia, or immunocompromised conditions. People who received 1-2 doses of PPSV23 before age 77 years should receive another dose of PPSV23 vaccine at age 59 years or later if at least 5 years have passed since the previous dose. Doses of PPSV23 are not needed for people immunized with PPSV23 at or after age 61  years.  Meningococcal vaccine. Adults with asplenia or persistent complement component deficiencies should receive 2 doses of quadrivalent meningococcal conjugate (MenACWY-D) vaccine. The doses should be obtained at least 2 months apart. Microbiologists working with certain meningococcal bacteria, [employer] recruits, people at risk during an outbreak, and people who travel to or live in countries with a high rate of meningitis should be immunized. A first-year college student up through age 48 years who is living in a residence hall should receive  a dose if he did not receive a dose on or after his 16th birthday. Adults who have certain high-risk conditions should receive one or more doses of vaccine.  Hepatitis A vaccine. Adults who wish to be protected from this disease, have chronic liver disease, work with hepatitis A-infected animals, work in hepatitis A research labs, or travel to or work in countries with a high rate of hepatitis A should be immunized. Adults who were previously unvaccinated and who anticipate close contact with an international adoptee during the first 60 days after arrival in the [place] from a country with a high rate of hepatitis A should be immunized.  Hepatitis B vaccine. Adults should be immunized if they wish to be protected from this disease, are under age 91 years and have diabetes, have chronic liver disease, have had more than one sex partner in the past 6 months, may be exposed to blood or other infectious body fluids, are household contacts or sex partners of hepatitis B positive people, are clients or workers in certain care facilities, or travel to or work in countries with a high rate of hepatitis B.  Haemophilus influenzae type b (Hib) vaccine. A previously unvaccinated person with asplenia or sickle cell disease or having a scheduled splenectomy should receive 1 dose of Hib vaccine. Regardless of previous immunization, a recipient of a hematopoietic stem cell  transplant should receive a 3-dose series 6-12 months after his successful transplant. Hib vaccine is not recommended for adults with HIV infection. Preventive Service / Frequency Ages 82 to 41  Blood pressure check.** / Every 3-5 years.  Lipid and cholesterol check.** / Every 5 years beginning at age 45.  Hepatitis C blood test.** / For any individual with known risks for hepatitis C.  Skin self-exam. / Monthly.  Influenza vaccine. / Every year.  Tetanus, diphtheria, and acellular pertussis (Tdap, Td) vaccine.** / Consult your health care provider. 1 dose of Td every 10 years.  Varicella vaccine.** / Consult your health care provider.  HPV vaccine. / 3 doses over 6 months, if 72 or younger.  Measles, mumps, rubella (MMR) vaccine.** / You need at least 1 dose of MMR if you were born in 1957 or later. You may also need a second dose.  Pneumococcal 13-valent conjugate (PCV13) vaccine.** / Consult your health care provider.  Pneumococcal polysaccharide (PPSV23) vaccine.** / 1 to 2 doses if you smoke cigarettes or if you have certain conditions.  Meningococcal vaccine.** / 1 dose if you are age 69 to 66 years and a Market researcher living in a residence hall, or have one of several medical conditions. You may also need additional booster doses.  Hepatitis A vaccine.** / Consult your health care provider.  Hepatitis B vaccine.** / Consult your health care provider.  Haemophilus influenzae type b (Hib) vaccine.** / Consult your health care provider. Ages 71 to 25  Blood pressure check.** / Every year.  Lipid and cholesterol check.** / Every 5 years beginning at age 29.  Lung cancer screening. / Every year if you are aged 37-80 years and have a 30-pack-year history of smoking and currently smoke or have quit within the past 15 years. Yearly screening is stopped once you have quit smoking for at least 15 years or develop a health problem that would prevent you from having  lung cancer treatment.  Fecal occult blood test (FOBT) of stool. / Every year beginning at age 54 and continuing until age 15. You may not have to  this test if you get a colonoscopy every 10 years.  Flexible sigmoidoscopy** or colonoscopy.** / Every 5 years for a flexible sigmoidoscopy or every 10 years for a colonoscopy beginning at age 50 and continuing until age 75.  Hepatitis C blood test.** / For all people born from 1945 through 1965 and any individual with known risks for hepatitis C.  Skin self-exam. / Monthly.  Influenza vaccine. / Every year.  Tetanus, diphtheria, and acellular pertussis (Tdap/Td) vaccine.** / Consult your health care provider. 1 dose of Td every 10 years.  Varicella vaccine.** / Consult your health care provider.  Zoster vaccine.** / 1 dose for adults aged 60 years or older.  Measles, mumps, rubella (MMR) vaccine.** / You need at least 1 dose of MMR if you were born in 1957 or later. You may also need a second dose.  Pneumococcal 13-valent conjugate (PCV13) vaccine.** / Consult your health care provider.  Pneumococcal polysaccharide (PPSV23) vaccine.** / 1 to 2 doses if you smoke cigarettes or if you have certain conditions.  Meningococcal vaccine.** / Consult your health care provider.  Hepatitis A vaccine.** / Consult your health care provider.  Hepatitis B vaccine.** / Consult your health care provider.  Haemophilus influenzae type b (Hib) vaccine.** / Consult your health care provider. Ages 65 and over  Blood pressure check.** / Every year.  Lipid and cholesterol check.**/ Every 5 years beginning at age 20.  Lung cancer screening. / Every year if you are aged 55-80 years and have a 30-pack-year history of smoking and currently smoke or have quit within the past 15 years. Yearly screening is stopped once you have quit smoking for at least 15 years or develop a health problem that would prevent you from having lung cancer treatment.  Fecal  occult blood test (FOBT) of stool. / Every year beginning at age 50 and continuing until age 75. You may not have to do this test if you get a colonoscopy every 10 years.  Flexible sigmoidoscopy** or colonoscopy.** / Every 5 years for a flexible sigmoidoscopy or every 10 years for a colonoscopy beginning at age 50 and continuing until age 75.  Hepatitis C blood test.** / For all people born from 1945 through 1965 and any individual with known risks for hepatitis C.  Abdominal aortic aneurysm (AAA) screening.** / A one-time screening for ages 65 to 75 years who are current or former smokers.  Skin self-exam. / Monthly.  Influenza vaccine. / Every year.  Tetanus, diphtheria, and acellular pertussis (Tdap/Td) vaccine.** / 1 dose of Td every 10 years.  Varicella vaccine.** / Consult your health care provider.  Zoster vaccine.** / 1 dose for adults aged 60 years or older.  Pneumococcal 13-valent conjugate (PCV13) vaccine.** / 1 dose for all adults aged 65 years and older.  Pneumococcal polysaccharide (PPSV23) vaccine.** / 1 dose for all adults aged 65 years and older.  Meningococcal vaccine.** / Consult your health care provider.  Hepatitis A vaccine.** / Consult your health care provider.  Hepatitis B vaccine.** / Consult your health care provider.  Haemophilus influenzae type b (Hib) vaccine.** / Consult your health care provider. **Family history and personal history of risk and conditions may change your health care provider's recommendations.   This information is not intended to replace advice given to you by your health care provider. Make sure you discuss any questions you have with your health care provider.   Document Released: 06/23/2001 Document Revised: 05/18/2014 Document Reviewed: 09/22/2010 Elsevier Interactive Patient Education 2016   2016 [employer].

## 2017-03-26 ENCOUNTER — Encounter: Payer: Self-pay | Admitting: Gastroenterology

## 2017-04-05 ENCOUNTER — Ambulatory Visit (INDEPENDENT_AMBULATORY_CARE_PROVIDER_SITE_OTHER): Payer: Medicare Other | Admitting: Adult Health

## 2017-04-05 ENCOUNTER — Encounter: Payer: Self-pay | Admitting: Adult Health

## 2017-04-05 VITALS — BP 117/69 | HR 72 | Temp 98.0°F | Ht 68.0 in | Wt 195.4 lb

## 2017-04-05 DIAGNOSIS — S50361A Insect bite (nonvenomous) of right elbow, initial encounter: Secondary | ICD-10-CM | POA: Diagnosis not present

## 2017-04-05 DIAGNOSIS — W57XXXA Bitten or stung by nonvenomous insect and other nonvenomous arthropods, initial encounter: Secondary | ICD-10-CM | POA: Diagnosis not present

## 2017-04-05 MED ORDER — DOXYCYCLINE HYCLATE 100 MG PO TABS: 100.0000 mg | ORAL_TABLET | Freq: Two times a day (BID) | ORAL | 0 refills | Status: DC

## 2017-04-05 NOTE — Assessment & Plan Note (Signed)
Please use Doxycycline as directed. Cold compress and OTC hydrocortisone cream as needed for pain and itching, OTC antihistamine once daily will also help reduce itching. If symptoms persist after antibiotic completed, then please call clinic.

## 2017-04-05 NOTE — Patient Instructions (Signed)
Insect Bite, Adult An insect bite can make your skin red, itchy, and swollen. An insect bite is different from an insect sting, which happens when an insect injects poison (venom) into the skin. Some insects can spread disease to people through a bite. However, most insect bites do not lead to disease and are not serious. What are the causes? Insects may bite for a variety of reasons, including:  Hunger.  To defend themselves.  Insects that bite include:  Spiders.  Mosquitoes.  Ticks.  Fleas.  Ants.  Flies.  Bedbugs.  What are the signs or symptoms? Symptoms of this condition include:  Itching or pain in the bite area.  Redness and swelling in the bite area.  An open wound (skin ulcer).  In many cases, symptoms last for 2-4 days. How is this diagnosed? This condition is usually diagnosed based on symptoms and a physical exam. How is this treated? Treatment is usually not needed. Symptoms often go away on their own. When treatment is recommended, it may involve:  Applying a cream or lotion to the bitten area. This treatment helps with itching.  Taking an antibiotic medicine. This treatment is needed if the bite area gets infected.  Getting a tetanus shot.  Applying ice to the affected area.  Medicines called antihistamines. This treatment is needed if you develop an allergic reaction to the insect bite.  Follow these instructions at home: Bite area care  Do not scratch the bite area.  Keep the bite area clean and dry. Wash it every day with soap and water as told by your health care provider.  Check the bite area every day for signs of infection. Check for: ? More redness, swelling, or pain. ? Fluid or blood. ? Warmth. ? Pus. Managing pain, itching, and swelling   You may apply a baking soda paste, cortisone cream, or calamine lotion to the bite area as told by your health care provider.  If directed, applyice to the bite area. ? Put ice in a  plastic bag. ? Place a towel between your skin and the bag. ? Leave the ice on for 20 minutes, 2-3 times per day. Medicines  Apply or take over-the-counter and prescription medicines only as told by your health care provider.  If you were prescribed an antibiotic medicine, use it as told by your health care provider. Do not stop using the antibiotic even if your condition improves. General instructions  Keep all follow-up visits as told by your health care provider. This is important. How is this prevented? To help reduce your risk of insect bites:  When you are outdoors, wear clothing that covers your arms and legs.  Use insect repellent. The best insect repellents contain: ? DEET, picaridin, oil of lemon eucalyptus (OLE), or IR3535. ? Higher amounts of an active ingredient.  If your home windows do not have screens, consider installing them.  Contact a health care provider if:  You have more redness, swelling, or pain in the bite area.  You have fluid, blood, or pus coming from the bite area.  The bite area feels warm to the touch.  You have a fever. Get help right away if:  You have joint pain.  You have a rash.  You have shortness of breath.  You feel unusually tired or sleepy.  You have neck pain.  You have a headache.  You have unusual weakness.  You have chest pain.  You have nausea, vomiting, or pain in the abdomen. This  information is not intended to replace advice given to you by your health care provider. Make sure you discuss any questions you have with your health care provider. Document Released: 06/04/2004 Document Revised: 12/25/2015 Document Reviewed: 11/04/2015 Elsevier Interactive Patient Education  2018 [employer].  Please use Doxycycline as directed. Cold compress and OTC hydrocortisone cream as needed for pain and itching, OTC antihistamine once daily will also help reduce itching. If symptoms persist after antibiotic completed, then  please call clinic. FEEL BETTER! NICE TO SEE YOU!

## 2017-04-05 NOTE — Progress Notes (Signed)
Subjective:    Patient ID: Alan Hurst, male    DOB: Jun 13, 1945, 71 y.o.   MRN: [id number]  HPI:  Alan Hurst is here for insect bite to L elbow that he noticed last Thursday am after rising from sleep.  L elbow is warm, red, and mildly tender (2/10, constant dull ache).  He denies seeing the insect that presumably bit him.  He denies fever/night sweats/HA/N/V/D. He denies any drainage from site.  Patient Care Team    Relationship Specialty Notifications Start End  [name], DO PCP - General Family Medicine  11/21/15   Christain Sacramento, [place] Referring Physician Optometry  03/02/16    Comment: Blanche East, MD Consulting Physician Dermatology  10/26/16     Patient Active Problem List   Diagnosis Date Noted  . Bite, insect 04/05/2017  . Health education/counseling 03/25/2017  . Allergic rhinitis 03/25/2017  . 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, without long-term current use of insulin ([place]) 03/01/2017  . Mixed diabetic hyperlipidemia associated with type 2 diabetes mellitus ([place]) 03/01/2017  . Hypertension associated with diabetes ([place]) 03/01/2017  . Cough in adult at night primarily when lying down 03/01/2017  . Vitamin D deficiency 03/01/2017  . Rash and nonspecific skin eruption 09/22/2016  . 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 Fall   . 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 Essential hypertension   . Femur fracture ([place]) 08/04/2016  . Overweight (BMI 25.0-29.9) 03/02/2016  . Chronic kidney disease- stage 3a 01/02/2016  . Type 2 diabetes mellitus without complication, without long-term current use of insulin ([place]) 12/05/2015  . HLD (hyperlipidemia) 12/05/2015  . GERD (gastroesophageal reflux disease) 12/05/2015  . 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 Vitamin D insufficiency 12/05/2015  . Vitamin B  deficiency 12/05/2015  . Recovering alcoholic in remission ([place]) 12/05/2015  . History of tobacco abuse 12/05/2015     Past Medical History:  Diagnosis Date  . Femur fracture, right ([place])   . GERD (gastroesophageal reflux disease) 12/05/2015   Well controlled on meds  . Hip fracture ([place])   . HLD (hyperlipidemia) 12/05/2015   10+ yrs at least.   . 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 Vitamin B deficiency 12/05/2015  . Vitamin D insufficiency 12/05/2015     Past Surgical History:  Procedure Laterality Date  . FEMUR FRACTURE SURGERY    . HIP FRACTURE SURGERY    . JOINT REPLACEMENT Bilateral    hips  . TIBIA FRACTURE SURGERY    . TOTAL HIP REVISION Right 08/07/2016   Procedure: RIGHT TOTAL HIP REVISION;  Surgeon: Rod Can, MD;  Location: [facility];  Service: Orthopedics;  Laterality: Right;     Family History  Problem Relation Age of Onset  . Alcohol abuse Mother   . Alcohol abuse Father   . Congestive Heart Failure Father   . Kidney disease Father      Social History   Substance and Sexual Activity  Drug Use No     Social History   Substance and Sexual Activity  Alcohol Use No     Social History   Tobacco Use  Smoking Status Former Smoker  . Last attempt to quit: 05/11/1993  . Years since quitting: 23.9  Smokeless Tobacco Never Used     Outpatient Encounter Medications as of 04/05/2017  Medication  Sig Note  . aspirin EC 81 MG tablet Take 81 mg by mouth daily.   . B Complex Vitamins (VITAMIN-B COMPLEX) TABS Take 1 tablet by mouth daily. 12/05/2015: Received from: [facility]: Take 1 tablet by mouth daily.  . Cholecalciferol (VITAMIN D) 2000 units tablet Take 1 tablet by mouth daily. 12/05/2015: Received from: [facility]: Take 1 tablet by mouth daily at 0600.  Marland Kitchen Ferrous Gluconate (IRON) 240 (27 Fe) MG TABS Take 1 tablet daily by mouth.   . fluticasone (FLONASE) 50 MCG/ACT nasal  spray Place 1 spray 2 (two) times daily into both nostrils. After sinus rinse   . lisinopril (PRINIVIL,ZESTRIL) 2.5 MG tablet Take 1 tablet (2.5 mg total) by mouth daily.   Marland Kitchen lovastatin (MEVACOR) 40 MG tablet Take 1 tablet (40 mg total) by mouth daily.   . magnesium oxide (MAG-OX) 400 MG tablet Take 400 mg by mouth daily.   . Melatonin 1 MG TABS Take 3 tablets by mouth at bedtime. 12/05/2015: Received from: [facility]: Take by mouth nightly.  . metFORMIN (GLUCOPHAGE) 500 MG tablet Take 1 tablet (500 mg total) by mouth daily.   . Multiple Vitamin (MULTIVITAMIN) tablet Take 1 tablet by mouth daily.   . Omega-3 Fatty Acids (FISH OIL) 1000 MG CAPS Take 1 capsule by mouth daily. 12/05/2015: Received from: [facility]: Take 1 capsule by mouth daily at 0600.  Marland Kitchen omeprazole (PRILOSEC) 20 MG capsule Take 1 capsule (20 mg total) by mouth daily.   Marland Kitchen doxycycline (VIBRA-TABS) 100 MG tablet Take 1 tablet (100 mg total) by mouth 2 (two) times daily.    No facility-administered encounter medications on file as of 04/05/2017.     Allergies: No known allergies  Body mass index is 29.71 kg/m.  Blood pressure 117/69, pulse 72, temperature 98 F (36.7 C), temperature source Oral, height 5\' 8"  (1.727 m), weight 195 lb 6.4 oz (88.6 kg).     Review of Systems  Constitutional: Positive for fatigue. Negative for activity change, appetite change, chills, diaphoresis, fever and unexpected weight change.  Respiratory: Negative for cough, chest tightness, shortness of breath, wheezing and stridor.   Cardiovascular: Negative for chest pain, palpitations and leg swelling.  Gastrointestinal: Negative for abdominal distention, abdominal pain, blood in stool, constipation, diarrhea, nausea and vomiting.  Endocrine: Negative for cold intolerance, heat intolerance, polydipsia, polyphagia and polyuria.  Skin: Positive for color change and wound. Negative for pallor and rash.  Neurological:  Negative for dizziness and headaches.  Hematological: Does not bruise/bleed easily.       Objective:   Physical Exam  Constitutional: He appears well-developed and well-nourished. No distress.  Skin: Skin is warm and dry. No rash noted. He is not diaphoretic. There is erythema. No pallor.     One discrete insect bite site L elbow. Redness, warmth, TTP from L elbow to mid FA. No open tissue/drainage/streaking noted. Strong L radial pulses noted, brisk cap refill  Psychiatric: He has a normal mood and affect. His behavior is normal. Judgment and thought content normal.  Nursing note and vitals reviewed.         Assessment & Plan:   1. Insect bite, initial encounter     Bite, insect Please use Doxycycline as directed. Cold compress and OTC hydrocortisone cream as needed for pain and itching, OTC antihistamine once daily will also help reduce itching. If symptoms persist after antibiotic completed, then please call clinic.  FOLLOW-UP:  Return if symptoms worsen or fail to improve.

## 2017-05-13 ENCOUNTER — Ambulatory Visit (AMBULATORY_SURGERY_CENTER): Payer: Self-pay | Admitting: *Deleted

## 2017-05-13 ENCOUNTER — Other Ambulatory Visit: Payer: Self-pay

## 2017-05-13 VITALS — Ht 69.0 in | Wt 194.6 lb

## 2017-05-13 DIAGNOSIS — Z1211 Encounter for screening for malignant neoplasm of colon: Secondary | ICD-10-CM

## 2017-05-13 MED ORDER — PEG-KCL-NACL-NASULF-NA ASC-C 140 G PO SOLR: 1.0000 | Freq: Once | ORAL | 0 refills | Status: AC

## 2017-05-13 NOTE — Progress Notes (Signed)
Denies allergies to eggs or soy products. Denies complications with sedation or anesthesia. Denies O2 use. Denies use of diet or weight loss medications.  Emmi instructions given for colonoscopy.  

## 2017-05-21 ENCOUNTER — Telehealth: Payer: Self-pay | Admitting: Gastroenterology

## 2017-05-21 ENCOUNTER — Encounter: Payer: Self-pay | Admitting: Gastroenterology

## 2017-05-21 DIAGNOSIS — Z1211 Encounter for screening for malignant neoplasm of colon: Secondary | ICD-10-CM

## 2017-05-21 MED ORDER — NA SULFATE-K SULFATE-MG SULF 17.5-3.13-1.6 GM/177ML PO SOLN: 1.0000 | Freq: Once | ORAL | 0 refills | Status: AC

## 2017-05-21 NOTE — Telephone Encounter (Signed)
Spoke with wife. She states Plenvu $129. Notified her that I called his pharmacy and Suprep would be $45. She states he would be able to pay that. New suprep instructions at front desk on 4th floor and new Suprep rx called into pharmacy. Wife verbalizes understanding and she will tell the pt.

## 2017-05-27 ENCOUNTER — Ambulatory Visit (AMBULATORY_SURGERY_CENTER): Payer: Medicare Other | Admitting: Gastroenterology

## 2017-05-27 ENCOUNTER — Other Ambulatory Visit: Payer: Self-pay

## 2017-05-27 ENCOUNTER — Encounter: Payer: Self-pay | Admitting: Gastroenterology

## 2017-05-27 VITALS — BP 105/66 | HR 72 | Temp 97.7°F | Resp 12 | Ht 69.0 in | Wt 194.0 lb

## 2017-05-27 DIAGNOSIS — Z1212 Encounter for screening for malignant neoplasm of rectum: Secondary | ICD-10-CM | POA: Diagnosis not present

## 2017-05-27 DIAGNOSIS — D125 Benign neoplasm of sigmoid colon: Secondary | ICD-10-CM | POA: Diagnosis not present

## 2017-05-27 DIAGNOSIS — Z1211 Encounter for screening for malignant neoplasm of colon: Secondary | ICD-10-CM

## 2017-05-27 DIAGNOSIS — D120 Benign neoplasm of cecum: Secondary | ICD-10-CM | POA: Diagnosis not present

## 2017-05-27 DIAGNOSIS — D123 Benign neoplasm of transverse colon: Secondary | ICD-10-CM | POA: Diagnosis not present

## 2017-05-27 MED ORDER — SODIUM CHLORIDE 0.9 % IV SOLN: 500.0000 mL | INTRAVENOUS | Status: DC

## 2017-05-27 NOTE — Progress Notes (Signed)
Report given to PACU, vss 

## 2017-05-27 NOTE — Op Note (Signed)
[facility] Patient Name: Alan Hurst Procedure Date: 05/27/2017 8:51 AM MRN: [id number] Endoscopist: Remo Lipps P. Armbruster MD, MD Age: 72 Referring MD:  Date of Birth: 1945/09/14 Gender: Male Account #: [id number] Procedure:                Colonoscopy Indications:              Screening for colorectal malignant neoplasm Medicines:                Monitored Anesthesia Care Procedure:                Pre-Anesthesia Assessment:                           - Prior to the procedure, a History and Physical                            was performed, and patient medications and                            allergies were reviewed. The patient's tolerance of                            previous anesthesia was also reviewed. The risks                            and benefits of the procedure and the sedation                            options and risks were discussed with the patient.                            All questions were answered, and informed consent                            was obtained. Prior Anticoagulants: The patient has                            taken no previous anticoagulant or antiplatelet                            agents. ASA Grade Assessment: II - A patient with                            mild systemic disease. After reviewing the risks                            and benefits, the patient was deemed in                            satisfactory condition to undergo the procedure.                           After obtaining informed consent, the colonoscope  was passed under direct vision. Throughout the                            procedure, the patient's blood pressure, pulse, and                            oxygen saturations were monitored continuously. The                            Colonoscope was introduced through the anus and                            advanced to the the cecum, identified by                            appendiceal orifice  and ileocecal valve. The                            colonoscopy was performed without difficulty. The                            patient tolerated the procedure well. The quality                            of the bowel preparation was adequate. The                            ileocecal valve, appendiceal orifice, and rectum                            were photographed. Scope In: 8:56:28 AM Scope Out: 9:16:02 AM Scope Withdrawal Time: 0 hours 18 minutes 2 seconds  Total Procedure Duration: 0 hours 19 minutes 34 seconds  Findings:                 The perianal exam findings include a large skin tag                            / external hemorrhoid.                           A 8 mm polyp was found in the cecum. The polyp was                            sessile. The polyp was removed with a cold snare.                            Resection and retrieval were complete.                           A 5 mm polyp was found in the transverse colon. The                            polyp was sessile. The polyp was removed  with a                            cold snare. Resection and retrieval were complete.                           A 4 mm polyp was found in the sigmoid colon. The                            polyp was sessile. The polyp was removed with a                            cold snare. Resection and retrieval were complete.                           Internal hemorrhoids were found during                            retroflexion. The hemorrhoids were large.                           The preparation was mostly adequate with exception                            of the cecal cap and a small portion of the splenic                            flexure - in which residual seeds / nuts were noted                            and could not be cleared. Attempts at rotating the                            patient to clear the cecal cap were not successful.                            The exam was otherwise without  abnormality. Complications:            No immediate complications. Estimated blood loss:                            Minimal. Estimated Blood Loss:     Estimated blood loss was minimal. Impression:               - Large perianal skin tag found on perianal exam.                           - One 8 mm polyp in the cecum, removed with a cold                            snare. Resected and retrieved.                           - One 5  mm polyp in the transverse colon, removed                            with a cold snare. Resected and retrieved.                           - One 4 mm polyp in the sigmoid colon, removed with                            a cold snare. Resected and retrieved.                           - Internal hemorrhoids.                           - The examination was otherwise normal with                            exception of a small area of the cecal cap and                            splenic flexure which could not be cleared as                            outlined due to residual seeds / nuts. Recommendation:           - Patient has a contact number available for                            emergencies. The signs and symptoms of potential                            delayed complications were discussed with the                            patient. Return to normal activities tomorrow.                            Written discharge instructions were provided to the                            patient.                           - Resume previous diet.                           - Continue present medications.                           - Await pathology results.                           - Repeat colonoscopy is recommended for  surveillance. Would recommend interval sooner than                            normally recommended given 2 focal areas not well                            seen on this colonoscopy as outlined, will discuss                            with  patient Alan Hurst. Armbruster MD, MD 05/27/2017 9:22:58 AM This report has been signed electronically.

## 2017-05-27 NOTE — Progress Notes (Signed)
Called to room to assist during endoscopic procedure.  Patient ID and intended procedure confirmed with present staff. Received instructions for my participation in the procedure from the performing physician.  

## 2017-05-27 NOTE — Patient Instructions (Signed)
YOU HAD AN ENDOSCOPIC PROCEDURE TODAY AT [facility]:   Refer to the procedure report that was given to you for any specific questions about what was found during the examination.  If the procedure report does not answer your questions, please call your gastroenterologist to clarify.  If you requested that your care partner not be given the details of your procedure findings, then the procedure report has been included in a sealed envelope for you to review at your convenience later.  YOU SHOULD EXPECT: Some feelings of bloating in the abdomen. Passage of more gas than usual.  Walking can help get rid of the air that was put into your GI tract during the procedure and reduce the bloating. If you had a lower endoscopy (such as a colonoscopy or flexible sigmoidoscopy) you may notice spotting of blood in your stool or on the toilet paper. If you underwent a bowel prep for your procedure, you may not have a normal bowel movement for a few days.  Please Note:  You might notice some irritation and congestion in your nose or some drainage.  This is from the oxygen used during your procedure.  There is no need for concern and it should clear up in a day or so.  SYMPTOMS TO REPORT IMMEDIATELY:   Following lower endoscopy (colonoscopy or flexible sigmoidoscopy):  Excessive amounts of blood in the stool  Significant tenderness or worsening of abdominal pains  Swelling of the abdomen that is new, acute  Fever of 100F or higher   For urgent or emergent issues, a gastroenterologist can be reached at any hour by calling [phone].   DIET:  We do recommend a small meal at first, but then you may proceed to your regular diet.  Drink plenty of fluids but you should avoid alcoholic beverages for 24 hours.  ACTIVITY:  You should plan to take it easy for the rest of today and you should NOT DRIVE or use heavy machinery until tomorrow (because of the sedation medicines used during the test).     FOLLOW UP: Our staff will call the number listed on your records the next business day following your procedure to check on you and address any questions or concerns that you may have regarding the information given to you following your procedure. If we do not reach you, we will leave a message.  However, if you are feeling well and you are not experiencing any problems, there is no need to return our call.  We will assume that you have returned to your regular daily activities without incident.  If any biopsies were taken you will be contacted by phone or by letter within the next 1-3 weeks.  Please call us at [phone] if you have not heard about the biopsies in 3 weeks.    SIGNATURES/CONFIDENTIALITY: You and/or your care partner have signed paperwork which will be entered into your electronic medical record.  These signatures attest to the fact that that the information above on your After Visit Summary has been reviewed and is understood.  Full responsibility of the confidentiality of this discharge information lies with you and/or your care-partner.  Fair prep noted.  Read all handouts given to you by your recovery room nurse.

## 2017-05-28 ENCOUNTER — Telehealth: Payer: Self-pay | Admitting: *Deleted

## 2017-05-28 NOTE — Telephone Encounter (Signed)
  Follow up Call-  Call back number 05/27/2017  Post procedure Call Back phone  # [phone]  Permission to leave phone message Yes  Some recent data might be hidden     Patient questions:  Do you have a fever, pain , or abdominal swelling? No. Pain Score  0 *  Have you tolerated food without any problems? Yes.    Have you been able to return to your normal activities? Yes.    Do you have any questions about your discharge instructions: Diet   No. Medications  No. Follow up visit  No.  Do you have questions or concerns about your Care? No.  Actions: * If pain score is 4 or above: No action needed, pain <4.

## 2017-06-02 ENCOUNTER — Encounter: Payer: Self-pay | Admitting: Gastroenterology

## 2017-06-14 ENCOUNTER — Ambulatory Visit (INDEPENDENT_AMBULATORY_CARE_PROVIDER_SITE_OTHER): Payer: Medicare Other | Admitting: Family Medicine

## 2017-06-14 ENCOUNTER — Encounter: Payer: Self-pay | Admitting: Family Medicine

## 2017-06-14 VITALS — BP 108/72 | HR 89 | Temp 98.0°F | Ht 68.0 in | Wt 195.0 lb

## 2017-06-14 DIAGNOSIS — J069 Acute upper respiratory infection, unspecified: Secondary | ICD-10-CM

## 2017-06-14 DIAGNOSIS — J309 Allergic rhinitis, unspecified: Secondary | ICD-10-CM | POA: Diagnosis not present

## 2017-06-14 DIAGNOSIS — R062 Wheezing: Secondary | ICD-10-CM | POA: Diagnosis not present

## 2017-06-14 DIAGNOSIS — J3089 Other allergic rhinitis: Secondary | ICD-10-CM | POA: Insufficient documentation

## 2017-06-14 MED ORDER — FLUTICASONE PROPIONATE 50 MCG/ACT NA SUSP: 1.0000 | Freq: Two times a day (BID) | NASAL | 6 refills | Status: DC

## 2017-06-14 MED ORDER — ALBUTEROL SULFATE HFA 108 (90 BASE) MCG/ACT IN AERS: 1.0000 | INHALATION_SPRAY | RESPIRATORY_TRACT | 2 refills | Status: DC | PRN

## 2017-06-14 NOTE — Patient Instructions (Signed)
You most likely have a viral infection that should resolve on its own over time (or this could be a flare of seasonal allergies as well).   Symptoms for a viral upper respiratory tract infection usually last 3-7 days but can stretch out to 2-3 weeks before you're feeling back to normal.  Your symptoms should not worsen after 7-10 days and if they truely do, please notify our office, as you may need antibiotics.  You can use over-the-counter afrin nasal spray for up to 3 days (NO longer than that) which will help acutely with nasal drainage/ congestion short term.   Also, sterile saline nasal rinses, such as Milta Deiters med or AYR sinus rinses, can be very helpful and should be done twice daily- especially throughout the allergy season.   Remember you should use distilled water or previously boiled water to do this.   You can also use an over the counter cold and flu medication such as Tylenol Severe Cold and Sinus/Flu or Dayquil, Nyquil and the like, which will help with cough, congestion, headache/ pain, fevers/chills etc.  Please note, if you being treated for hypertension or have high blood pressure, you should be using the cold meds designated "HBP".    Unfortunately, antibiotics are not helpful for viral infections.  Wash your hands frequently, as you did not want to get those around you sick as well. Never sneeze or cough on others.  And you should not be going to school or work if you are running a temperature of 100.5 or more on two separate occasions.   Drink plenty of fluids and stay hydrated, especially if you are running fevers.  We don't know why, but chicken soup also helps, try it! :)

## 2017-06-14 NOTE — Progress Notes (Signed)
Acute Care Office visit  Assessment and plan:  1. Viral upper respiratory tract infection   2. Wheeze   3. Environmental and seasonal allergies   4. Allergic rhinitis, unspecified seasonality, unspecified trigger     - Viral vs Allergic vs Bacterial causes for pt's symptoms reveiwed.    - Supportive care and various OTC medications discussed in addition to any prescribed. - Call or RTC if new symptoms, or if no improvement or worse over next several days.   - Will consider ABX if sx continue past 10 days and worsening if not already given.   Sinusitis  - Advised the patient to begin using AYR or Neilmed sinus rinses BID, followed by flonase BID (one spray to each nostril).  Patient should always use distilled water if not prescribed rinses.  As we enter allergy season, patient may incorporate zyrtec, allegra, etc. as-needed.  - Continue pushing fluids, more than half of the patient's body weight in ounces of water daily.  - Continue OTC medicines that are working.  - The effects of low humidity, especially during this time of year, were reviewed at length with the patient.  Humidifier by bedside at night encouraged.  Make sure to empty and clean the humidifier, clean it out with white vinegar, and let it completely dry out at least all day every couple of weeks, prior to using it again.  Prescriptions - Write for an inhaler for as-needed use, just in case.  Flonase refilled.   Meds ordered this encounter  Medications  . albuterol (PROVENTIL HFA;VENTOLIN HFA) 108 (90 Base) MCG/ACT inhaler    Sig: Inhale 1-2 puffs into the lungs every 4 (four) hours as needed for wheezing or shortness of breath.    Dispense:  1 Inhaler    Refill:  2  . fluticasone (FLONASE) 50 MCG/ACT nasal spray    Sig: Place 1 spray into both nostrils 2 (two) times daily. After sinus rinse    Dispense:  16 g    Refill:  6    No orders of the defined types were placed in this encounter.   Gross side  effects, risk and benefits, and alternatives of medications discussed with patient.  Patient is aware that all medications have potential side effects and we are unable to predict every sideeffect or drug-drug interaction that may occur.  Expresses verbal understanding and consents to current therapy plan and treatment regiment.   Education and routine counseling performed. Handouts provided.  Anticipatory guidance and routine counseling done re: condition, txmnt options and need for follow up. All questions of patient's were answered.  Return for For chronic care as previously discussed.  Please see AVS handed out to patient at the end of our visit for additional patient instructions/ counseling done pertaining to today's office visit.  Note: This document was partially repared using Dragon voice recognition software and may include unintentional dictation errors.  This document serves as a record of services personally performed by [name], DO. It was created on her behalf by Toni Amend, a trained medical scribe. The creation of this record is based on the scribe's personal observations and the provider's statements to them.   I have reviewed the above medical documentation for accuracy and completeness and I concur.  [name] 06/14/17 3:02 PM    Subjective:    Chief Complaint  Patient presents with  . Cough    nonproductive cough and congestion x 6 days    HPI:  Pt presents  with Sx for 6 days.  Notes that today he's "pretty much over it."   C/o: Cough, wheezing, stuffy feeling, runny nose.  Produced sputum and phlegm when he coughed.  Notes that it was "just that cruddy feeling."  Denies: Fever or chills.  Does not feel SOB or poorly today.  Overall denies many current symptoms.    For symptoms patient has tried:  Mucinex, Tussin, Delsym, tylenol,   Overall getting:   Feeling much better overall, noting that he only came in today to get his lungs  checked out.   Patient Care Team    Relationship Specialty Notifications Start End  [name], DO PCP - General Family Medicine  11/21/15   Christain [place], [place] Referring Physician Optometry  03/02/16    Comment: Blanche East, MD Consulting Physician Dermatology  10/26/16     Past medical history, Surgical history, Family history reviewed and noted below, Social history, Allergies, and Medications have been entered into the medical record, reviewed and changed as needed.   Allergies  Allergen Reactions  . No Known Allergies     Review of Systems: - see above HPI for pertinent positives General:   No F/C, wt loss Pulm:   No DIB, pleuritic chest pain Card:  No CP, palpitations Abd:  No n/v/d or pain Ext:  No inc edema from baseline   Objective:   Blood pressure 108/72, pulse 89, temperature 98 F (36.7 C), height 5\' 8"  (1.727 m), weight 195 lb (88.5 kg), SpO2 96 %. Body mass index is 29.65 kg/m. General: Well Developed, well nourished, appropriate for stated age.  Neuro: Alert and oriented x3, extra-ocular muscles intact, sensation grossly intact.  HEENT: Normocephalic, atraumatic, pupils equal round reactive to light, neck supple, no masses, no painful lymphadenopathy, TM's intact B/L, no acute findings. Nares- patent, clear d/c, OP- clear, very very mild erythema, No TTP sinuses Skin: Warm and dry, no gross rash. Cardiac: RRR, S1 S2,  no murmurs rubs or gallops.  Respiratory: ECTA B/L and A/P, Not using accessory muscles, speaking in full sentences- unlabored.  One slight end expiratory wheeze, left upper lobe, posteriorly only. Vascular:  No gross lower ext edema, cap RF less 2 sec. Psych: No HI/SI, judgement and insight good, Euthymic mood. Full Affect.

## 2017-06-28 ENCOUNTER — Encounter: Payer: Self-pay | Admitting: Family Medicine

## 2017-06-28 ENCOUNTER — Ambulatory Visit (INDEPENDENT_AMBULATORY_CARE_PROVIDER_SITE_OTHER): Payer: Medicare Other | Admitting: Family Medicine

## 2017-06-28 VITALS — BP 112/63 | HR 94 | Ht 67.99 in | Wt 198.0 lb

## 2017-06-28 DIAGNOSIS — K219 Gastro-esophageal reflux disease without esophagitis: Secondary | ICD-10-CM

## 2017-06-28 DIAGNOSIS — E782 Mixed hyperlipidemia: Secondary | ICD-10-CM

## 2017-06-28 DIAGNOSIS — N183 Chronic kidney disease, stage 3 unspecified: Secondary | ICD-10-CM

## 2017-06-28 DIAGNOSIS — E0822 Diabetes mellitus due to underlying condition with diabetic chronic kidney disease: Secondary | ICD-10-CM

## 2017-06-28 DIAGNOSIS — E1159 Type 2 diabetes mellitus with other circulatory complications: Secondary | ICD-10-CM | POA: Diagnosis not present

## 2017-06-28 DIAGNOSIS — I10 Essential (primary) hypertension: Secondary | ICD-10-CM | POA: Diagnosis not present

## 2017-06-28 DIAGNOSIS — E1169 Type 2 diabetes mellitus with other specified complication: Secondary | ICD-10-CM

## 2017-06-28 DIAGNOSIS — E663 Overweight: Secondary | ICD-10-CM

## 2017-06-28 DIAGNOSIS — F1021 Alcohol dependence, in remission: Secondary | ICD-10-CM | POA: Diagnosis not present

## 2017-06-28 DIAGNOSIS — E119 Type 2 diabetes mellitus without complications: Secondary | ICD-10-CM | POA: Diagnosis not present

## 2017-06-28 LAB — POCT GLYCOSYLATED HEMOGLOBIN (HGB A1C): Hemoglobin A1C: 5.9

## 2017-06-28 MED ORDER — LOVASTATIN 40 MG PO TABS: 40.0000 mg | ORAL_TABLET | Freq: Every day | ORAL | 3 refills | Status: DC

## 2017-06-28 MED ORDER — METFORMIN HCL 500 MG PO TABS: 500.0000 mg | ORAL_TABLET | Freq: Every day | ORAL | 3 refills | Status: DC

## 2017-06-28 MED ORDER — OMEPRAZOLE 20 MG PO CPDR: 20.0000 mg | DELAYED_RELEASE_CAPSULE | Freq: Every day | ORAL | 3 refills | Status: DC

## 2017-06-28 MED ORDER — LISINOPRIL 2.5 MG PO TABS: 2.5000 mg | ORAL_TABLET | Freq: Every day | ORAL | 3 refills | Status: DC

## 2017-06-28 NOTE — Progress Notes (Signed)
Impression and Recommendations:    1. 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, without long-term current use of insulin ([place])   2. Mixed diabetic hyperlipidemia associated with type 2 diabetes mellitus ([place])   3. Type 2 diabetes mellitus without complication, without long-term current use of insulin ([place])   4. Hypertension associated with diabetes ([place])   5. Recovering alcoholic in remission ([place])   6. Overweight (BMI 25.0-29.9)   7. Stage 3 chronic kidney disease ([place])   8. Mixed hyperlipidemia   9. Gastroesophageal reflux disease, esophagitis presence not specified     - 4 prescriptions refilled today.  1. Diabetes Mellitus - A1C in office today 5.9. - Diabetes is currently well controlled. - Continue on metformin as prescribed.  - If he would like to begin checking his sugars again, he may. - Encouraged the patient to check his blood sugar after he eats particularly poorly, or particularly well.  - Advised the patient that as BMI goes up, A1C will go up. - Patient reviewed plans on beginning to eat better and exercise, now that he feels up to it.  2. Blood Pressure - Blood pressure looks fantastic today. - Patient should continue to monitor it at home. - Continue on lisinopril as prescribed.  3. HLD - Patient does take his statins at bedtime. - Continue on statin as prescribed.  4. Reflux - Continue taking omeprazole as prescribed.  5. Breathing - Patient has an albuterol inhaler. - Advised not to use his albuterol inhaler unless he needs it.  6. Dermatology - Needs to obtain skin screening and follow-up on rash through dermatology (Dr. Denna Haggard). - Dark spot on the top of patient's head should be reviewed by dermatology.  7. General Health Maintenance - Discussed the importance of maintaining a BMI below 30.  - Exercising regularly will help his breathing, combat atelectasis, and improve his lung health. - Advised  patient to continue working toward exercising to improve health, making small achievable goals.  - PT will begin with 20 minutes of activity, 3 days a week.  Moderate activity including "huffing and puffing" to expand his lungs. - Recommended that the patient eventually strive for at least 150 minutes of cardio per week according to the [facility].   - Recommended using weights to do strength training exercises, pushups, or other exercises.  - Healthy dietary habits encouraged, including low-carb, and high amounts of lean protein in diet.  - Patient should also consume adequate amounts of water - half of body weight in oz of water per day. - Patient should consume more water on days that he is very active.  - BMI Review Explained to patient what BMI refers to, and what it means medically.    Told patient to think about it as a "medical risk stratification measurement" and how increasing BMI is associated with increasing risk/ or worsening state of various diseases such as hypertension, hyperlipidemia, diabetes, premature OA, depression etc.  American Heart Association guidelines for healthy diet, basically Mediterranean diet, and exercise guidelines of 30 minutes 5 days per week or more discussed in detail.  Health counseling performed.  All questions answered.  8. Follow-Up - Reviewed the importance of obtaining a yearly physical examination, and medicare wellness. - Patient will return in 4 months for diabetes and healthy habits follow-up.    Education and routine counseling performed. Handouts provided.  Orders Placed This Encounter  Procedures  . POCT HgB A1C  Meds ordered this encounter  Medications  . lovastatin (MEVACOR) 40 MG tablet    Sig: Take 1 tablet (40 mg total) by mouth at bedtime.    Dispense:  90 tablet    Refill:  3  . lisinopril (PRINIVIL,ZESTRIL) 2.5 MG tablet    Sig: Take 1 tablet (2.5 mg total) by mouth daily.    Dispense:  90 tablet    Refill:  3  .  metFORMIN (GLUCOPHAGE) 500 MG tablet    Sig: Take 1 tablet (500 mg total) by mouth daily.    Dispense:  90 tablet    Refill:  3  . omeprazole (PRILOSEC) 20 MG capsule    Sig: Take 1 capsule (20 mg total) by mouth daily.    Dispense:  90 capsule    Refill:  3    Return for 4 mo or so- DM.   The patient was counseled, risk factors were discussed, anticipatory guidance given.  Gross side effects, risk and benefits, and alternatives of medications discussed with patient.  Patient is aware that all medications have potential side effects and we are unable to predict every side effect or drug-drug interaction that may occur.  Expresses verbal understanding and consents to current therapy plan and treatment regimen.  Please see AVS handed out to patient at the end of our visit for further patient instructions/ counseling done pertaining to today's office visit.    Note: This document was prepared using Dragon voice recognition software and may include unintentional dictation errors.  This document serves as a record of services personally performed by [name], DO. It was created on her behalf by Toni Amend, a trained medical scribe. The creation of this record is based on the scribe's personal observations and the provider's statements to them.   I have reviewed the above medical documentation for accuracy and completeness and I concur.  [name] 07/17/17 2:58 PM    Subjective:    Chief Complaint  Patient presents with  . Follow-up    Alan Hurst is a 72 y.o. male who presents to [place] [facility] today for Diabetes Management.    Finally got a blood pressure machine at home.  Reports that his blood pressure has been running good at home.  Denies chest pains, shortness of breath, dizziness, exercise intolerance.  Notes that his rash still comes and goes, so he may like to return to Dermatology.  Continues taking his lisinopril as  prescribed. Continues taking omeprazole every day. Continues taking his statins at bedtime.  Notes that he makes a point of drinking a couple of glasses of water whenever he is in the kitchen.  DM HPI: -  He has not been working on diet and exercise for diabetes.  Is limiting sugar intake.  Notes that he knows he needs to eat better.  Currently doing minimal exercise, but has plans to get back on the treadmill now that he's able to walk. He got no exercise at all for six months.  He is eager to begin walking again. When he was sick, he got down to 179 (around 10/26/2016).  Pt is currently maintained on the following medications for diabetes:  Metformin Medication compliance - continues to be compliant with his medication.  Home glucose readings range: does not currently measure at home.   Denies polyuria/polydipsia. Denies hypo/ hyperglycemia symptoms - He denies new onset of: chest pain, exercise intolerance, shortness of breath, dizziness, visual changes, headache, lower extremity swelling  or claudication.   Last diabetic eye exam was No results found for: HMDIABEYEEXA  Foot exam - UTD  Last A1C in the office was: 5.9 (today)  Lab Results  Component Value Date   HGBA1C 5.9 06/28/2017   HGBA1C 5.7 (H) 02/22/2017   HGBA1C 5.9 05/26/2016    Lab Results  Component Value Date   MICROALBUR 10 03/25/2017   [place] 62 02/22/2017   CREATININE 1.03 02/22/2017    Last 3 blood pressure readings in our office are as follows: BP Readings from Last 3 Encounters:  06/28/17 112/63  06/14/17 108/72  05/27/17 105/66    BMI Readings from Last 3 Encounters:  06/28/17 30.11 kg/m  06/14/17 29.65 kg/m  05/27/17 28.65 kg/m     No problems updated.    Patient Care Team    Relationship Specialty Notifications Start End  [name], DO PCP - General Family Medicine  11/21/15   Christain Sacramento, [place] Referring Physician Optometry  03/02/16    Comment: Blanche East,  MD Consulting Physician Dermatology  10/26/16      Patient Active Problem List   Diagnosis Date Noted  . 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, without long-term current use of insulin ([place]) 03/01/2017    Priority: High  . Mixed diabetic hyperlipidemia associated with type 2 diabetes mellitus ([place]) 03/01/2017    Priority: High  . Hypertension associated with diabetes ([place]) 03/01/2017    Priority: High  . Type 2 diabetes mellitus without complication, without long-term current use of insulin ([place]) 12/05/2015    Priority: High  . Chronic kidney disease- stage 3a 01/02/2016    Priority: Medium  . HLD (hyperlipidemia) 12/05/2015    Priority: Medium  . Recovering alcoholic in remission ([place]) 12/05/2015    Priority: Medium  . History of tobacco abuse 12/05/2015    Priority: Medium  . GERD (gastroesophageal reflux disease) 12/05/2015    Priority: Low  . Sleep disorder 12/05/2015    Priority: Low  . Vitamin D insufficiency 12/05/2015    Priority: Low  . Vitamin B deficiency 12/05/2015    Priority: Low  . Environmental and seasonal allergies 06/14/2017  . Bite, insect 04/05/2017  . Health education/counseling 03/25/2017  . Allergic rhinitis 03/25/2017  . Cough in adult at night primarily when lying down 03/01/2017  . Vitamin D deficiency 03/01/2017  . Rash and nonspecific skin eruption 09/22/2016  . 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 Fall   . 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 Essential hypertension   . Femur fracture ([place]) 08/04/2016  . 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 Diabetes ([place])   . Femur fracture, right ([place])   . GERD (gastroesophageal reflux disease) 12/05/2015   Well controlled on meds  . H/O: substance abuse 1999  . Hip fracture ([place])   . HLD (hyperlipidemia) 12/05/2015   10+ yrs at least.     . 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 Vitamin B deficiency 12/05/2015  . Vitamin D insufficiency 12/05/2015     Past Surgical History:  Procedure Laterality Date  . CATARACT EXTRACTION, BILATERAL  10/2016  . FEMUR FRACTURE SURGERY    . HIP FRACTURE SURGERY    . JOINT REPLACEMENT Bilateral    hips  . TIBIA FRACTURE SURGERY    . TOTAL HIP REVISION Right  08/07/2016   Procedure: RIGHT TOTAL HIP REVISION;  Surgeon: Rod Can, MD;  Location: [facility];  Service: Orthopedics;  Laterality: Right;     Family History  Problem Relation Age of Onset  . Alcohol abuse Mother   . Alcohol abuse Father   . Congestive Heart Failure Father   . Kidney disease Father   . Colon cancer Neg Hx   . Esophageal cancer Neg Hx   . Rectal cancer Neg Hx   . Stomach cancer Neg Hx      Social History   Substance and Sexual Activity  Drug Use No  ,  Social History   Substance and Sexual Activity  Alcohol Use No  ,  Social History   Tobacco Use  Smoking Status Former Smoker  . Last attempt to quit: 05/11/1993  . Years since quitting: 24.2  Smokeless Tobacco Never Used    Current Outpatient Medications on File Prior to Visit  Medication Sig Dispense Refill  . albuterol (PROVENTIL HFA;VENTOLIN HFA) 108 (90 Base) MCG/ACT inhaler Inhale 1-2 puffs into the lungs every 4 (four) hours as needed for wheezing or shortness of breath. 1 Inhaler 2  . aspirin EC 81 MG tablet Take 81 mg by mouth daily.    . Cholecalciferol (VITAMIN D) 2000 units tablet Take 1 tablet by mouth daily.    . fluticasone (FLONASE) 50 MCG/ACT nasal spray Place 1 spray into both nostrils 2 (two) times daily. After sinus rinse 16 g 6  . magnesium oxide (MAG-OX) 400 MG tablet Take 400 mg by mouth daily.    . Melatonin 1 MG TABS Take 5 mg by mouth at bedtime.     . Omega-3 Fatty Acids (FISH OIL) 1000 MG CAPS Take 1 capsule by mouth daily.     Current Facility-Administered Medications on  File Prior to Visit  Medication Dose Route Frequency Provider Last Rate Last Dose  . 0.9 %  sodium chloride infusion  500 mL Intravenous Continuous Armbruster, Carlota Raspberry, MD         Allergies  Allergen Reactions  . No Known Allergies      Review of Systems:   General:  Denies fever, chills Optho/Auditory:   Denies visual changes, blurred vision Respiratory:   Denies SOB, cough, wheeze, DIB  Cardiovascular:   Denies chest pain, palpitations, painful respirations Gastrointestinal:   Denies nausea, vomiting, diarrhea.  Endocrine:     Denies new hot or cold intolerance Musculoskeletal:  Denies joint swelling, gait issues, or new unexplained myalgias/ arthralgias Skin:  Denies rash, suspicious lesions  Neurological:    Denies dizziness, unexplained weakness, numbness  Psychiatric/Behavioral:   Denies mood changes    Objective:     Blood pressure 112/63, pulse 94, height 5' 7.99" (1.727 m), weight 198 lb (89.8 kg), SpO2 96 %.  Body mass index is 30.11 kg/m.  General: Well Developed, well nourished, and in no acute distress.  HEENT: Normocephalic, atraumatic, pupils equal round reactive to light, neck supple, No carotid bruits, no JVD Skin: Warm and dry, cap RF less 2 sec Cardiac: Regular rate and rhythm, S1, S2 WNL's, no murmurs rubs or gallops Respiratory: ECTA B/L, bibasilar crackles.  Not using accessory muscles, speaking in full sentences.   NeuroM-Sk: Ambulates w/o assistance, moves ext * 4 w/o difficulty, sensation grossly intact.  Ext: scant edema b/l lower ext Psych: No HI/SI, judgement and insight good, Euthymic mood. Full Affect.

## 2017-06-28 NOTE — Patient Instructions (Addendum)
Dr. Lavonna Monarch  - Derm f/up.   Goals for next time: 11mo- 3 d /wk 20 min per time of moderate intensity cardio on treadmill - 1/2 wt in ounces of water per     Diabetes Mellitus and Standards of Medical Care  Managing diabetes (diabetes mellitus) can be complicated. Your diabetes treatment may be managed by a team of health care providers, including:  A diet and nutrition specialist (registered dietitian).  A nurse.  A certified diabetes educator (CDE).  A diabetes specialist (endocrinologist).  An eye doctor.  A primary care provider.  A dentist.  Your health care providers follow a schedule in order to help you get the best quality of care. The following schedule is a general guideline for your diabetes management plan. Your health care providers may also give you more specific instructions.  HbA1c (hemoglobin A1c) test This test provides information about blood sugar (glucose) control over the previous 2-3 months. It is used to check whether your diabetes management plan needs to be adjusted.  If you are meeting your treatment goals, this test is done at least 2 times a year.  If you are not meeting treatment goals or if your treatment goals have changed, this test is done 4 times a year.  Blood pressure test  This test is done at every routine medical visit. For most people, the goal is less than 130/80. Ask your health care provider what your goal blood pressure should be.  Dental and eye exams  Visit your dentist two times a year.  If you have type 1 diabetes, get an eye exam 3-5 years after you are diagnosed, and then once a year after your first exam. ? If you were diagnosed with type 1 diabetes as a child, get an eye exam when you are age 4549or older and have had diabetes for 3-5 years. After the first exam, you should get an eye exam once a year.  If you have type 2 diabetes, have an eye exam as soon as you are diagnosed, and then once a year after your  first exam.  Foot care exam  Visual foot exams are done at every routine medical visit. The exams check for cuts, bruises, redness, blisters, sores, or other problems with the feet.  A complete foot exam is done by your health care provider once a year. This exam includes an inspection of the structure and skin of your feet, and a check of the pulses and sensation in your feet. ? Type 1 diabetes: Get your first exam 3-5 years after diagnosis. ? Type 2 diabetes: Get your first exam as soon as you are diagnosed.  Check your feet every day for cuts, bruises, redness, blisters, or sores. If you have any of these or other problems that are not healing, contact your health care provider.  Kidney function test (urine microalbumin)  This test is done once a year. ? Type 1 diabetes: Get your first test 5 years after diagnosis. ? Type 2 diabetes: Get your first test as soon as you are diagnosed._  If you have chronic kidney disease (CKD), get a serum creatinine and estimated glomerular filtration rate (eGFR) test once a year.  Lipid profile (cholesterol, HDL, LDL, triglycerides)  This test should be done when you are diagnosed with diabetes, and every 5 years after the first test. If you are on medicines to lower your cholesterol, you may need to get this test done every year. ? The goal  for LDL is less than 100 mg/dL (5.5 mmol/L). If you are at high risk, the goal is less than 70 mg/dL (3.9 mmol/L). ? The goal for HDL is 40 mg/dL (2.2 mmol/L) for men and 50 mg/dL(2.8 mmol/L) for women. An HDL cholesterol of 60 mg/dL (3.3 mmol/L) or higher gives some protection against heart disease. ? The goal for triglycerides is less than 150 mg/dL (8.3 mmol/L).  Immunizations  The yearly flu (influenza) vaccine is recommended for everyone 6 months or older who has diabetes.  The pneumonia (pneumococcal) vaccine is recommended for everyone 2 years or older who has diabetes. If you are 47 or older, you may get  the pneumonia vaccine as a series of two separate shots.  The hepatitis B vaccine is recommended for adults shortly after they have been diagnosed with diabetes.  The Tdap (tetanus, diphtheria, and pertussis) vaccine should be given: ? According to normal childhood vaccination schedules, for children. ? Every 10 years, for adults who have diabetes.  The shingles vaccine is recommended for people who have had chicken pox and are 50 years or older.  Mental and emotional health  Screening for symptoms of eating disorders, anxiety, and depression is recommended at the time of diagnosis and afterward as needed. If your screening shows that you have symptoms (you have a positive screening result), you may need further evaluation and be referred to a mental health care provider.  Diabetes self-management education  Education about how to manage your diabetes is recommended at diagnosis and ongoing as needed.  Treatment plan  Your treatment plan will be reviewed at every medical visit.  Summary  Managing diabetes (diabetes mellitus) can be complicated. Your diabetes treatment may be managed by a team of health care providers.  Your health care providers follow a schedule in order to help you get the best quality of care.  Standards of care including having regular physical exams, blood tests, blood pressure monitoring, immunizations, screening tests, and education about how to manage your diabetes.  Your health care providers may also give you more specific instructions based on your individual health.      Type 2 Diabetes Mellitus, Self Care, Adult Caring for yourself after you have been diagnosed with type 2 diabetes (type 2 diabetes mellitus) means keeping your blood sugar (glucose) under control with a balance of:  Nutrition.  Exercise.  Lifestyle changes.  Medicines or insulin, if necessary.  Support from your team of health care providers and others.  The following  information explains what you need to know to manage your diabetes at home. What do I need to do to manage my blood glucose?  Check your blood glucose every day, as often as told by your health care provider.  Contact your health care provider if your blood glucose is above your target for 2 tests in a row.  Have your A1c (hemoglobin A1c) level checked at least two times a year, or as often as told by your health care provider. Your health care provider will set individualized treatment goals for you. Generally, the goal of treatment is to maintain the following blood glucose levels:  Before meals (preprandial): 80-130 mg/dL (4.4-7.2 mmol/L).  After meals (postprandial): below 180 mg/dL (10 mmol/L).  A1c level: less than 7%.  What do I need to know about hyperglycemia and hypoglycemia? What is hyperglycemia? Hyperglycemia, also called high blood glucose, occurs when blood glucose is too high.Make sure you know the early signs of hyperglycemia, such as:  Increased  thirst.  Hunger.  Feeling very tired.  Needing to urinate more often than usual.  Blurry vision.  What is hypoglycemia? Hypoglycemia, also called low blood glucose, occurswith a blood glucose level at or below 70 mg/dL (3.9 mmol/L). The risk for hypoglycemia increases during or after exercise, during sleep, during illness, and when skipping meals or not eating for a long time (fasting). It is important to know the symptoms of hypoglycemia and treat it right away. Always have a 15-gram rapid-acting carbohydrate snack with you to treat low blood glucose. Family members and close friends should also know the symptoms and should understand how to treat hypoglycemia, in case you are not able to treat yourself. What are the symptoms of hypoglycemia? Hypoglycemia symptoms can include:  Hunger.  Anxiety.  Sweating and feeling clammy.  Confusion.  Dizziness or feeling light-headed.  Sleepiness.  Nausea.  Increased  heart rate.  Headache.  Blurry vision.  Seizure.  Nightmares.  Tingling or numbness around the mouth, lips, or tongue.  A change in speech.  Decreased ability to concentrate.  A change in coordination.  Restless sleep.  Tremors or shakes.  Fainting.  Irritability.  How do I treat hypoglycemia?  If you are alert and able to swallow safely, follow the 15:15 rule:  Take 15 grams of a rapid-acting carbohydrate. Rapid-acting options include: ? 1 tube of glucose gel. ? 3 glucose pills. ? 6-8 pieces of hard candy. ? 4 oz (120 mL) of fruit juice. ? 4 oz (120 mL) of regular (not diet) soda.  Check your blood glucose 15 minutes after you take the carbohydrate.  If the repeat blood glucose level is still at or below 70 mg/dL (3.9 mmol/L), take 15 grams of a carbohydrate again.  If your blood glucose level does not increase above 70 mg/dL (3.9 mmol/L) after 3 tries, seek emergency medical care.  After your blood glucose level returns to normal, eat a meal or a snack within 1 hour.  How do I treat severe hypoglycemia? Severe hypoglycemia is when your blood glucose level is at or below 54 mg/dL (3 mmol/L). Severe hypoglycemia is an emergency. Do not wait to see if the symptoms will go away. Get medical help right away. Call your local emergency services (911 in the [place] Do not drive yourself to the hospital. If you have severe hypoglycemia and you cannot eat or drink, you may need an injection of glucagon. A family member or close friend should learn how to check your blood glucose and how to give you a glucagon injection. Ask your health care provider if you need to have an emergency glucagon injection kit available. Severe hypoglycemia may need to be treated in a hospital. The treatment may include getting glucose through an IV tube. You may also need treatment for the cause of your hypoglycemia. Can having diabetes put me at risk for other conditions? Having diabetes can put  you at risk for other long-term (chronic) conditions, such as heart disease and kidney disease. Your health care provider may prescribe medicines to help prevent complications from diabetes. These medicines may include:  Aspirin.  Medicine to lower cholesterol.  Medicine to control blood pressure.  What else can I do to manage my diabetes? Take your diabetes medicines as told  If your health care provider prescribed insulin or diabetes medicines, take them every day.  Do not run out of insulin or other diabetes medicines that you take. Plan ahead so you always have these available.  If  you use insulin, adjust your dosage based on how physically active you are and what foods you eat. Your health care provider will tell you how to adjust your dosage. Make healthy food choices  The things that you eat and drink affect your blood glucose and your insulin dosage. Making good choices helps to control your diabetes and prevent other health problems. A healthy meal plan includes eating lean proteins, complex carbohydrates, fresh fruits and vegetables, low-fat dairy products, and healthy fats. Make an appointment to see a diet and nutrition specialist (registered dietitian) to help you create an eating plan that is right for you. Make sure that you:  Follow instructions from your health care provider about eating or drinking restrictions.  Drink enough fluid to keep your urine clear or pale yellow.  Eat healthy snacks between nutritious meals.  Track the carbohydrates that you eat. Do this by reading food labels and learning the standard serving sizes of foods.  Follow your sick day plan whenever you cannot eat or drink as usual. Make this plan in advance with your health care provider.  Stay active  Exercise regularly, as told by your health care provider. This may include:  Stretching and doing strength exercises, such as yoga or weightlifting, at least 2 times a week.  Doing at least  150 minutes of moderate-intensity or vigorous-intensity exercise each week. This could be brisk walking, biking, or water aerobics. ? Spread out your activity over at least 3 days of the week. ? Do not go more than 2 days in a row without doing some kind of physical activity.  When you start a new exercise or activity, work with your health care provider to adjust your insulin, medicines, or food intake as needed. Make healthy lifestyle choices  Do not use any tobacco products, such as cigarettes, chewing tobacco, and e-cigarettes. If you need help quitting, ask your health care provider.  If your health care provider says that alcohol is safe for you, limit alcohol intake to no more than 1 drink per day for nonpregnant women and 2 drinks per day for men. One drink equals 12 oz of beer, 5 oz of wine, or 1 oz of hard liquor.  Learn to manage stress. If you need help with this, ask your health care provider. Care for your body   Keep your immunizations up to date. In addition to getting vaccinations as told by your health care provider, it is recommended that you get vaccinated against the following illnesses: ? The flu (influenza). Get a flu shot every year. ? Pneumonia. ? Hepatitis B.  Schedule an eye exam soon after your diagnosis, and then one time every year after that.  Check your skin and feet every day for cuts, bruises, redness, blisters, or sores. Schedule a foot exam with your health care provider once every year.  Brush your teeth and gums two times a day, and floss at least one time a day. Visit your dentist at least once every 6 months.  Maintain a healthy weight. General instructions  Take over-the-counter and prescription medicines only as told by your health care provider.  Share your diabetes management plan with people in your workplace, school, and household.  Check your urine for ketones when you are ill and as told by your health care provider.  Ask your health  care provider: ? Do I need to meet with a diabetes educator? ? Where can I find a support group for people with diabetes?  Carry  a medical alert card or wear medical alert jewelry.  Keep all follow-up visits as told by your health care provider. This is important. Where to find more information: For more information about diabetes, visit:  American Diabetes Association (ADA): www.diabetes.org  American Association of Diabetes Educators (AADE): www.diabeteseducator.org/patient-resources  This information is not intended to replace advice given to you by your health care provider. Make sure you discuss any questions you have with your health care provider. Document Released: 08/19/2015 Document Revised: 10/03/2015 Document Reviewed: 05/31/2015 Elsevier Interactive Patient Education  2017 [place].      Blood Glucose Monitoring, Adult Monitoring your blood sugar (glucose) helps you manage your diabetes. It also helps you and your health care provider determine how well your diabetes management plan is working. Blood glucose monitoring involves checking your blood glucose as often as directed, and keeping a record (log) of your results over time. Why should I monitor my blood glucose? Checking your blood glucose regularly can:  Help you understand how food, exercise, illnesses, and medicines affect your blood glucose.  Let you know what your blood glucose is at any time. You can quickly tell if you are having low blood glucose (hypoglycemia) or high blood glucose (hyperglycemia).  Help you and your health care provider adjust your medicines as needed.  When should I check my blood glucose? Follow instructions from your health care provider about how often to check your blood glucose.   This may depend on:  The type of diabetes you have.  How well-controlled your diabetes is.  Medicines you are taking.  If you have type 1 diabetes:  Check your blood glucose at least 2  times a day.  Also check your blood glucose: ? Before every insulin injection. ? Before and after exercise. ? Between meals. ? 2 hours after a meal. ? Occasionally between 2:00 a.m. and 3:00 a.m., as directed. ? Before potentially dangerous tasks, like driving or using heavy machinery. ? At bedtime.  You may need to check your blood glucose more often, up to 6-10 times a day: ? If you use an insulin pump. ? If you need multiple daily injections (MDI). ? If your diabetes is not well-controlled. ? If you are ill. ? If you have a history of severe hypoglycemia. ? If you have a history of not knowing when your blood glucose is getting low (hypoglycemia unawareness).  If you have type 2 diabetes:  If you take insulin or other diabetes medicines, check your blood glucose at least 2 times a day.  If you are on intensive insulin therapy, check your blood glucose at least 4 times a day. Occasionally, you may also need to check between 2:00 a.m. and 3:00 a.m., as directed.  Also check your blood glucose: ? Before and after exercise. ? Before potentially dangerous tasks, like driving or using heavy machinery.  You may need to check your blood glucose more often if: ? Your medicine is being adjusted. ? Your diabetes is not well-controlled. ? You are ill.  What is a blood glucose log?  A blood glucose log is a record of your blood glucose readings. It helps you and your health care provider: ? Look for patterns in your blood glucose over time. ? Adjust your diabetes management plan as needed.  Every time you check your blood glucose, write down your result and notes about things that may be affecting your blood glucose, such as your diet and exercise for the day.  Most glucose  meters store a record of glucose readings in the meter. Some meters allow you to download your records to a computer. How do I check my blood glucose? Follow these steps to get accurate readings of your blood  glucose: Supplies needed   Blood glucose meter.  Test strips for your meter. Each meter has its own strips. You must use the strips that come with your meter.  A needle to prick your finger (lancet). Do not use lancets more than once.  A device that holds the lancet (lancing device).  A journal or log book to write down your results.  Procedure  Wash your hands with soap and water.  Prick the side of your finger (not the tip) with the lancet. Use a different finger each time.  Gently rub the finger until a small drop of blood appears.  Follow instructions that come with your meter for inserting the test strip, applying blood to the strip, and using your blood glucose meter.  Write down your result and any notes.  Alternative testing sites  Some meters allow you to use areas of your body other than your finger (alternative sites) to test your blood.  If you think you may have hypoglycemia, or if you have hypoglycemia unawareness, do not use alternative sites. Use your finger instead.  Alternative sites may not be as accurate as the fingers, because blood flow is slower in these areas. This means that the result you get may be delayed, and it may be different from the result that you would get from your finger.  The most common alternative sites are: ? Forearm. ? Thigh. ? Palm of the hand.  Additional tips  Always keep your supplies with you.  If you have questions or need help, all blood glucose meters have a 24-hour "hotline" number that you can call. You may also contact your health care provider.  After you use a few boxes of test strips, adjust (calibrate) your blood glucose meter by following instructions that came with your meter.    The American Diabetes Association suggests the following targets for most nonpregnant adults with diabetes.  More or less stringent glycemic goals may be appropriate for each individual.  A1C: Less than 7% A1C may also be reported  as eAG: Less than 154 mg/dl Before a meal (preprandial plasma glucose): 80-130 mg/dl 1-2 hours after beginning of the meal (Postprandial plasma glucose)*: Less than 180 mg/dl  *Postprandial glucose may be targeted if A1C goals are not met despite reaching preprandial glucose goals.   GOALS in short:  The goals are for the Hgb A1C to be less than 7.0 & blood pressure to be less than 130/80.    It is recommended that all diabetics are educated on and follow a healthy diabetic diet, exercise for 30 minutes 3-4 times per week (walking, biking, swimming, or machine), monitor blood glucose readings and bring that record with you to be reviewed at your next office visit.     You should be checking fasting blood sugars- especially after you eat poorly or eat really healthy, and also check 2 hour postprandial blood sugars after largest meal of the day.    Write these down and bring in your log at each office visit.    You will need to be seen every 3 months by the provider managing your Diabetes unless told otherwise by that provider.   You will need yearly eye exams from an eye specialist and foot exams to check the  nerves of your feet.  Also, your urine should be checked yearly as well to make sure excess protein is not present.   If you are checking your blood pressure at home, please record it and bring it to your next office visit.    Follow the Dietary Approaches to Stop Hypertension (DASH) diet (3 servings of fruit and vegetables daily, whole grains, low sodium, low-fat proteins).  See below.    Lastly, when it comes to your cholesterol, the goal is to have the HDL (good cholesterol) >40, and the LDL (bad cholesterol) <100.   It is recommended that you follow a heart healthy, low saturated and trans-fat diet and exercise for 30 minutes at least 5 times a week.     (( Check out the DASH diet = 1.5 Gram Low Sodium Diet   A 1.5 gram sodium diet restricts the amount of sodium in the diet to no  more than 1.5 g or 1500 mg daily.  The American Heart Association recommends Americans over the age of 29 to consume no more than 1500 mg of sodium each day to reduce the risk of developing high blood pressure.  Research also shows that limiting sodium may reduce heart attack and stroke risk.  Many foods contain sodium for flavor and sometimes as a preservative.  When the amount of sodium in a diet needs to be low, it is important to know what to look for when choosing foods and drinks.  The following includes some information and guidelines to help make it easier for you to adapt to a low sodium diet.    QUICK TIPS  Do not add salt to food.  Avoid convenience items and fast food.  Choose unsalted snack foods.  Buy lower sodium products, often labeled as "lower sodium" or "no salt added."  Check food labels to learn how much sodium is in 1 serving.  When eating at a restaurant, ask that your food be prepared with less salt or none, if possible.    READING FOOD LABELS FOR SODIUM INFORMATION  The nutrition facts label is a good place to find how much sodium is in foods. Look for products with no more than 400 mg of sodium per serving.  Remember that 1.5 g = 1500 mg.  The food label may also list foods as:  Sodium-free: Less than 5 mg in a serving.  Very low sodium: 35 mg or less in a serving.  Low-sodium: 140 mg or less in a serving.  Light in sodium: 50% less sodium in a serving. For example, if a food that usually has 300 mg of sodium is changed to become light in sodium, it will have 150 mg of sodium.  Reduced sodium: 25% less sodium in a serving. For example, if a food that usually has 400 mg of sodium is changed to reduced sodium, it will have 300 mg of sodium.    CHOOSING FOODS  Grains  Avoid: Salted crackers and snack items. Some cereals, including instant hot cereals. Bread stuffing and biscuit mixes. Seasoned rice or pasta mixes.  Choose: Unsalted snack items. Low-sodium cereals,  oats, puffed wheat and rice, shredded wheat. English muffins and bread. Pasta.  Meats  Avoid: Salted, canned, smoked, spiced, pickled meats, including fish and poultry. Bacon, ham, sausage, cold cuts, hot dogs, anchovies.  Choose: Low-sodium canned tuna and salmon. Fresh or frozen meat, poultry, and fish.  Dairy  Avoid: Processed cheese and spreads. Cottage cheese. Buttermilk and condensed milk. Regular cheese.  Choose: Milk. Low-sodium cottage cheese. Yogurt. Sour cream. Low-sodium cheese.  Fruits and Vegetables  Avoid: Regular canned vegetables. Regular canned tomato sauce and paste. Frozen vegetables in sauces. Olives. Angie Fava. Relishes. Sauerkraut.  Choose: Low-sodium canned vegetables. Low-sodium tomato sauce and paste. Frozen or fresh vegetables. Fresh and frozen fruit.  Condiments  Avoid: Canned and packaged gravies. Worcestershire sauce. Tartar sauce. Barbecue sauce. Soy sauce. Steak sauce. Ketchup. Onion, garlic, and table salt. Meat flavorings and tenderizers.  Choose: Fresh and dried herbs and spices. Low-sodium varieties of mustard and ketchup. Lemon juice. Tabasco sauce. Horseradish.    SAMPLE 1.5 GRAM SODIUM MEAL PLAN:   Breakfast / Sodium (mg)  1 cup low-fat milk / 143 mg  1 whole-wheat English muffin / 240 mg  1 tbs heart-healthy margarine / 153 mg  1 hard-boiled egg / 139 mg  1 small orange / 0 mg  Lunch / Sodium (mg)  1 cup raw carrots / 76 mg  2 tbs no salt added peanut butter / 5 mg  2 slices whole-wheat bread / 270 mg  1 tbs jelly / 6 mg   cup red grapes / 2 mg  Dinner / Sodium (mg)  1 cup whole-wheat pasta / 2 mg  1 cup low-sodium tomato sauce / 73 mg  3 oz lean ground beef / 57 mg  1 small side salad (1 cup raw spinach leaves,  cup cucumber,  cup yellow bell pepper) with 1 tsp olive oil and 1 tsp red wine vinegar / 25 mg  Snack / Sodium (mg)  1 container low-fat vanilla yogurt / 107 mg  3 graham cracker squares / 127 mg  Nutrient Analysis  Calories: 1745   Protein: 75 g  Carbohydrate: 237 g  Fat: 57 g  Sodium: 1425 mg  Document Released: 04/27/2005 Document Revised: 01/07/2011 Document Reviewed: 07/29/2009  ExitCare Patient Information 2012 [place].))    This information is not intended to replace advice given to you by your health care provider. Make sure you discuss any questions you have with your health care provider. Document Released: 04/30/2003 Document Revised: 11/15/2015 Document Reviewed: 10/07/2015 Elsevier Interactive Patient Education  2017 [employer].

## 2017-06-28 NOTE — Progress Notes (Signed)
f °

## 2017-10-27 ENCOUNTER — Encounter: Payer: Self-pay | Admitting: Family Medicine

## 2017-10-27 ENCOUNTER — Ambulatory Visit (INDEPENDENT_AMBULATORY_CARE_PROVIDER_SITE_OTHER): Payer: Medicare Other | Admitting: Family Medicine

## 2017-10-27 VITALS — BP 105/64 | HR 99 | Ht 68.0 in | Wt 197.0 lb

## 2017-10-27 DIAGNOSIS — N183 Chronic kidney disease, stage 3 unspecified: Secondary | ICD-10-CM

## 2017-10-27 DIAGNOSIS — E119 Type 2 diabetes mellitus without complications: Secondary | ICD-10-CM

## 2017-10-27 DIAGNOSIS — I129 Hypertensive chronic kidney disease with stage 1 through stage 4 chronic kidney disease, or unspecified chronic kidney disease: Secondary | ICD-10-CM

## 2017-10-27 DIAGNOSIS — E782 Mixed hyperlipidemia: Secondary | ICD-10-CM | POA: Diagnosis not present

## 2017-10-27 DIAGNOSIS — E1159 Type 2 diabetes mellitus with other circulatory complications: Secondary | ICD-10-CM

## 2017-10-27 DIAGNOSIS — I10 Essential (primary) hypertension: Secondary | ICD-10-CM

## 2017-10-27 DIAGNOSIS — E1122 Type 2 diabetes mellitus with diabetic chronic kidney disease: Secondary | ICD-10-CM

## 2017-10-27 DIAGNOSIS — E0822 Diabetes mellitus due to underlying condition with diabetic chronic kidney disease: Secondary | ICD-10-CM

## 2017-10-27 DIAGNOSIS — E1169 Type 2 diabetes mellitus with other specified complication: Secondary | ICD-10-CM

## 2017-10-27 NOTE — Progress Notes (Signed)
Impression and Recommendations:    1. Type 2 diabetes mellitus without complication, without long-term current use of insulin ([place])   2. Mixed diabetic hyperlipidemia associated with type 2 diabetes mellitus ([place])   3. 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, without long-term current use of insulin ([place])   4. Hypertension associated with diabetes ([place])      1. DM2 -A1c last checked on 06-28-17 was 5.9 and under great control.  -compliant with meds and tolerating well. Continues meds. -continue prudent diet/exercise. Reduce intake of sweets and other carbohydrates.  2. HTN -BP well controlled at this time.  -pt is asymptomatic. Continue meds as listed below. -check BP at home and keep a log. Bring this into next OV. If BP spikes too low we may need to change med doses at that time.  -currently on 2.5 mg lisinopril.   3. Stage 3 CKD -GFR last checked 02-22-17 was 73. -control BS and BP. -drink adequate amounts of water, equal to half of your wt in oz per day.    4. Overweight -recommend losing weight. Prudent diet/exercise discussed.  -AHA exercise and dietary guidelines discussed. Encouraged to walk regularly 30+ minutes a day, 5 days a week. -join the [facility]  Sleep disturbances Continue melatonin 1 mg qhs.    Education and routine counseling performed. Handouts provided.  No orders of the defined types were placed in this encounter.   No orders of the defined types were placed in this encounter.   Return for Follow-up around mid October for fasting blood work and  wellness exam.   The patient was counseled, risk factors were discussed, anticipatory guidance given.  Gross side effects, risk and benefits, and alternatives of medications discussed with patient.  Patient is aware that all medications have potential side effects and we are unable to predict every side effect or drug-drug interaction that may occur.   Expresses verbal understanding and consents to current therapy plan and treatment regimen.  Please see AVS handed out to patient at the end of our visit for further patient instructions/ counseling done pertaining to today's office visit.    Note: This document was prepared using Dragon voice recognition software and may include unintentional dictation errors.  This document serves as a record of services personally performed by [name], DO. It was created on her behalf by Mayer Masker, a trained medical scribe. The creation of this record is based on the scribe's personal observations and the provider's statements to them.   I have reviewed the above medical documentation for accuracy and completeness and I concur.  [name] 10/28/17 3:04 PM    Subjective:    Chief Complaint  Patient presents with  . Follow-up     IHSAN NOMURA is a 72 y.o. male who presents to [facility] today for Diabetes Management, HTN, and mood.    Mood He's okay emotionally. He reports his brother just passed recently. His brother had issues of COPD and CHF and neglected his health for many years. He is sleeping alright and takes melatonin nightly. He declines meds today.  He has to take care of his brother's funeral arrangements.   Skin He complains of sun burn on his arms with itchiness to the affected area. He was at a festival recently and was out in the sun.  HTN HPI:  -  His blood pressure has been controlled at home.  Pt is  not checking it at home.   Is taking 2.5 lisinopril.   - Patient reports good compliance with blood pressure medications  - Denies medication S-E   - Smoking Status noted   - He denies new onset of: chest pain, exercise intolerance, shortness of breath, dizziness, visual changes, headache, lower extremity swelling or claudication.   Last 3 blood pressure readings in our office are as follows: BP Readings from Last 3  Encounters:  10/27/17 105/64  06/28/17 112/63  06/14/17 108/72    Filed Weights   10/27/17 1439  Weight: 197 lb (89.4 kg)   DM HPI: A1c last checked on 06-28-17 was 5.9 and under great control.   -  He has been working on diet and exercise for diabetes  Pt is currently maintained on the following medications for diabetes:   see med list today Medication compliance - yes, 500 mg qd.    Denies polyuria/polydipsia. Denies hypo/ hyperglycemia symptoms - He denies new onset of: chest pain, exercise intolerance, shortness of breath, dizziness, visual changes, headache, lower extremity swelling or claudication.   Last diabetic eye exam was No results found for: HMDIABEYEEXA  Foot exam- UTD  Last A1C in the office was:  Lab Results  Component Value Date   HGBA1C 5.9 06/28/2017   HGBA1C 5.7 (H) 02/22/2017   HGBA1C 5.9 05/26/2016    Lab Results  Component Value Date   MICROALBUR 10 03/25/2017   [place] 62 02/22/2017   CREATININE 1.03 02/22/2017      Last 3 blood pressure readings in our office are as follows: BP Readings from Last 3 Encounters:  10/27/17 105/64  06/28/17 112/63  06/14/17 108/72    BMI Readings from Last 3 Encounters:  10/27/17 29.95 kg/m  06/28/17 30.11 kg/m  06/14/17 29.65 kg/m     No problems updated.    Patient Care Team    Relationship Specialty Notifications Start End  [name], DO PCP - General Family Medicine  11/21/15   Christain [place], [place] Referring Physician Optometry  03/02/16    Comment: Blanche East, MD Consulting Physician Dermatology  10/26/16      Patient Active Problem List   Diagnosis Date Noted  . 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, without long-term current use of insulin ([place]) 03/01/2017    Priority: High  . Mixed diabetic hyperlipidemia associated with type 2 diabetes mellitus ([place]) 03/01/2017    Priority: High  . Hypertension associated with diabetes  ([place]) 03/01/2017    Priority: High  . Type 2 diabetes mellitus without complication, without long-term current use of insulin ([place]) 12/05/2015    Priority: High  . Chronic kidney disease- stage 3a 01/02/2016    Priority: Medium  . HLD (hyperlipidemia) 12/05/2015    Priority: Medium  . Recovering alcoholic in remission ([place]) 12/05/2015    Priority: Medium  . History of tobacco abuse 12/05/2015    Priority: Medium  . GERD (gastroesophageal reflux disease) 12/05/2015    Priority: Low  . Sleep disorder 12/05/2015    Priority: Low  . Vitamin D insufficiency 12/05/2015    Priority: Low  . Vitamin B deficiency 12/05/2015    Priority: Low  . Environmental and seasonal allergies 06/14/2017  . Bite, insect 04/05/2017  . Health education/counseling 03/25/2017  . Allergic rhinitis 03/25/2017  . Cough in adult at night primarily when lying down 03/01/2017  . Vitamin D deficiency 03/01/2017  . Rash and nonspecific skin eruption 09/22/2016  . 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 Fall   . Periprosthetic fracture around internal prosthetic right hip joint ([place])   . Essential hypertension   . Femur fracture ([place]) 08/04/2016  . 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 Diabetes ([place])   . Femur fracture, right ([place])   . GERD (gastroesophageal reflux disease) 12/05/2015   Well controlled on meds  . H/O: substance abuse 1999  . Hip fracture ([place])   . HLD (hyperlipidemia) 12/05/2015   10+ yrs at least.   . 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 Vitamin B deficiency 12/05/2015  . Vitamin D insufficiency 12/05/2015     Past Surgical History:  Procedure Laterality Date  . CATARACT EXTRACTION, BILATERAL  10/2016  . FEMUR FRACTURE SURGERY    . HIP FRACTURE SURGERY    . JOINT REPLACEMENT Bilateral    hips  . TIBIA FRACTURE SURGERY    . TOTAL HIP REVISION Right 08/07/2016     Procedure: RIGHT TOTAL HIP REVISION;  Surgeon: Rod Can, MD;  Location: [place];  Service: Orthopedics;  Laterality: Right;     Family History  Problem Relation Age of Onset  . Alcohol abuse Mother   . Alcohol abuse Father   . Congestive Heart Failure Father   . Kidney disease Father   . Colon cancer Neg Hx   . Esophageal cancer Neg Hx   . Rectal cancer Neg Hx   . Stomach cancer Neg Hx      Social History   Substance and Sexual Activity  Drug Use No  ,  Social History   Substance and Sexual Activity  Alcohol Use No  ,  Social History   Tobacco Use  Smoking Status Former Smoker  . Last attempt to quit: 05/11/1993  . Years since quitting: 24.4  Smokeless Tobacco Never Used  ,    Current Outpatient Medications on File Prior to Visit  Medication Sig Dispense Refill  . albuterol (PROVENTIL HFA;VENTOLIN HFA) 108 (90 Base) MCG/ACT inhaler Inhale 1-2 puffs into the lungs every 4 (four) hours as needed for wheezing or shortness of breath. 1 Inhaler 2  . aspirin EC 81 MG tablet Take 81 mg by mouth daily.    . Cholecalciferol (VITAMIN D) 2000 units tablet Take 1 tablet by mouth daily.    . fluticasone (FLONASE) 50 MCG/ACT nasal spray Place 1 spray into both nostrils 2 (two) times daily. After sinus rinse 16 g 6  . lisinopril (PRINIVIL,ZESTRIL) 2.5 MG tablet Take 1 tablet (2.5 mg total) by mouth daily. 90 tablet 3  . lovastatin (MEVACOR) 40 MG tablet Take 1 tablet (40 mg total) by mouth at bedtime. 90 tablet 3  . magnesium oxide (MAG-OX) 400 MG tablet Take 400 mg by mouth daily.    . Melatonin 1 MG TABS Take 5 mg by mouth at bedtime.     . metFORMIN (GLUCOPHAGE) 500 MG tablet Take 1 tablet (500 mg total) by mouth daily. 90 tablet 3  . Omega-3 Fatty Acids (FISH OIL) 1000 MG CAPS Take 1 capsule by mouth daily.    Marland Kitchen omeprazole (PRILOSEC) 20 MG capsule Take 1 capsule (20 mg total) by mouth daily. 90 capsule 3   Current Facility-Administered Medications on File Prior to Visit   Medication Dose Route Frequency Provider Last Rate Last Dose  . 0.9 %  sodium chloride infusion  500 mL Intravenous Continuous Armbruster,  Carlota Raspberry, MD         Allergies  Allergen Reactions  . No Known Allergies      Review of Systems:   General:  Denies fever, chills Optho/Auditory:   Denies visual changes, blurred vision Respiratory:   Denies SOB, cough, wheeze, DIB  Cardiovascular:   Denies chest pain, palpitations, painful respirations Gastrointestinal:   Denies nausea, vomiting, diarrhea.  Endocrine:     Denies new hot or cold intolerance Musculoskeletal:  Denies joint swelling, gait issues, or new unexplained myalgias/ arthralgias Skin:  Denies rash, suspicious lesions  Neurological:    Denies dizziness, unexplained weakness, numbness  Psychiatric/Behavioral:   Denies mood changes    Objective:     Blood pressure 105/64, pulse 99, height 5\' 8"  (1.727 m), weight 197 lb (89.4 kg), SpO2 98 %.  Body mass index is 29.95 kg/m.  General: Well Developed, well nourished, and in no acute distress.  HEENT: Normocephalic, atraumatic, pupils equal round reactive to light, neck supple, No carotid bruits, no JVD Skin: Warm and dry, cap RF less 2 sec Cardiac: Regular rate and rhythm, S1, S2 WNL's, no murmurs rubs or gallops Respiratory: ECTA B/L, Not using accessory muscles, speaking in full sentences. NeuroM-Sk: Ambulates w/o assistance, moves ext * 4 w/o difficulty, sensation grossly intact.  Ext: scant edema b/l lower ext Psych: No HI/SI, judgement and insight good, Euthymic mood. Full Affect.

## 2017-10-27 NOTE — Patient Instructions (Signed)
Please keep your eye on your blood pressure at home.  If you develop any symptoms of dizziness lightheadedness with getting up from a seated or laying down position, please let us know.  -Goal is to do some form of moderate intensity aerobic activity for 5 days a week at 30 minutes of pop or more.  Joining the [employer] would be fantastic.     GOALS :  The goals are for the Hgb A1C to be less than 7.0 & blood pressure to be less than 130/80.    It is recommended that all diabetics are educated on and follow a healthy diabetic diet, exercise for 30 minutes 3-4 times per week (walking, biking, swimming, or machine), monitor blood glucose readings and bring that record with you to be reviewed at your next office visit.     You should be checking fasting blood sugars- especially after you eat poorly or eat really healthy, and also check 2 hour postprandial blood sugars after largest meal of the day.    Write these down and bring in your log at each office visit.    You will need to be seen every 3 months by the provider managing your Diabetes unless told otherwise by that provider.   You will need yearly eye exams from an eye specialist and foot exams to check the nerves of your feet.  Also, your urine should be checked yearly as well to make sure excess protein is not present.   If you are checking your blood pressure at home, please record it and bring it to your next office visit.    Follow the Dietary Approaches to Stop Hypertension (DASH) diet (3 servings of fruit and vegetables daily, whole grains, low sodium, low-fat proteins).  See below.    Lastly, when it comes to your cholesterol, the goal is to have the HDL (good cholesterol) >40, and the LDL (bad cholesterol) <100.   It is recommended that you follow a heart healthy, low saturated and trans-fat diet and exercise for 30 minutes at least 5 times a week.     (( Check out the DASH diet = 1.5 Gram Low Sodium Diet   A 1.5 gram sodium diet  restricts the amount of sodium in the diet to no more than 1.5 g or 1500 mg daily.  The American Heart Association recommends Americans over the age of 60 to consume no more than 1500 mg of sodium each day to reduce the risk of developing high blood pressure.  Research also shows that limiting sodium may reduce heart attack and stroke risk.  Many foods contain sodium for flavor and sometimes as a preservative.  When the amount of sodium in a diet needs to be low, it is important to know what to look for when choosing foods and drinks.  The following includes some information and guidelines to help make it easier for you to adapt to a low sodium diet.    QUICK TIPS  Do not add salt to food.  Avoid convenience items and fast food.  Choose unsalted snack foods.  Buy lower sodium products, often labeled as "lower sodium" or "no salt added."  Check food labels to learn how much sodium is in 1 serving.  When eating at a restaurant, ask that your food be prepared with less salt or none, if possible.    READING FOOD LABELS FOR SODIUM INFORMATION  The nutrition facts label is a good place to find how much sodium is in  foods. Look for products with no more than 400 mg of sodium per serving.  Remember that 1.5 g = 1500 mg.  The food label may also list foods as:  Sodium-free: Less than 5 mg in a serving.  Very low sodium: 35 mg or less in a serving.  Low-sodium: 140 mg or less in a serving.  Light in sodium: 50% less sodium in a serving. For example, if a food that usually has 300 mg of sodium is changed to become light in sodium, it will have 150 mg of sodium.  Reduced sodium: 25% less sodium in a serving. For example, if a food that usually has 400 mg of sodium is changed to reduced sodium, it will have 300 mg of sodium.    CHOOSING FOODS  Grains  Avoid: Salted crackers and snack items. Some cereals, including instant hot cereals. Bread stuffing and biscuit mixes. Seasoned rice or pasta mixes.   Choose: Unsalted snack items. Low-sodium cereals, oats, puffed wheat and rice, shredded wheat. English muffins and bread. Pasta.  Meats  Avoid: Salted, canned, smoked, spiced, pickled meats, including fish and poultry. Bacon, ham, sausage, cold cuts, hot dogs, anchovies.  Choose: Low-sodium canned tuna and salmon. Fresh or frozen meat, poultry, and fish.  Dairy  Avoid: Processed cheese and spreads. Cottage cheese. Buttermilk and condensed milk. Regular cheese.  Choose: Milk. Low-sodium cottage cheese. Yogurt. Sour cream. Low-sodium cheese.  Fruits and Vegetables  Avoid: Regular canned vegetables. Regular canned tomato sauce and paste. Frozen vegetables in sauces. Olives. Angie Fava. Relishes. Sauerkraut.  Choose: Low-sodium canned vegetables. Low-sodium tomato sauce and paste. Frozen or fresh vegetables. Fresh and frozen fruit.  Condiments  Avoid: Canned and packaged gravies. Worcestershire sauce. Tartar sauce. Barbecue sauce. Soy sauce. Steak sauce. Ketchup. Onion, garlic, and table salt. Meat flavorings and tenderizers.  Choose: Fresh and dried herbs and spices. Low-sodium varieties of mustard and ketchup. Lemon juice. Tabasco sauce. Horseradish.    SAMPLE 1.5 GRAM SODIUM MEAL PLAN:   Breakfast / Sodium (mg)  1 cup low-fat milk / 143 mg  1 whole-wheat English muffin / 240 mg  1 tbs heart-healthy margarine / 153 mg  1 hard-boiled egg / 139 mg  1 small orange / 0 mg  Lunch / Sodium (mg)  1 cup raw carrots / 76 mg  2 tbs no salt added peanut butter / 5 mg  2 slices whole-wheat bread / 270 mg  1 tbs jelly / 6 mg   cup red grapes / 2 mg  Dinner / Sodium (mg)  1 cup whole-wheat pasta / 2 mg  1 cup low-sodium tomato sauce / 73 mg  3 oz lean ground beef / 57 mg  1 small side salad (1 cup raw spinach leaves,  cup cucumber,  cup yellow bell pepper) with 1 tsp olive oil and 1 tsp red wine vinegar / 25 mg  Snack / Sodium (mg)  1 container low-fat vanilla yogurt / 107 mg  3 graham cracker  squares / 127 mg  Nutrient Analysis  Calories: 1745  Protein: 75 g  Carbohydrate: 237 g  Fat: 57 g  Sodium: 1425 mg  Document Released: 04/27/2005 Document Revised: 01/07/2011 Document Reviewed: 07/29/2009  [facility] Patient Information 2012 [place], [place].))

## 2018-02-14 ENCOUNTER — Ambulatory Visit: Payer: Medicare Other | Admitting: Family Medicine

## 2018-02-15 ENCOUNTER — Ambulatory Visit (INDEPENDENT_AMBULATORY_CARE_PROVIDER_SITE_OTHER): Payer: Medicare Other

## 2018-02-15 ENCOUNTER — Ambulatory Visit: Payer: Medicare Other | Admitting: Family Medicine

## 2018-02-15 VITALS — BP 110/66 | HR 90 | Temp 98.6°F

## 2018-02-15 DIAGNOSIS — Z23 Encounter for immunization: Secondary | ICD-10-CM

## 2018-02-15 NOTE — Progress Notes (Signed)
Patient is here for flu vaccine.  Patient tolerated injection well. MPulliam, CMA/RT(R)  

## 2018-03-17 ENCOUNTER — Encounter: Payer: Self-pay | Admitting: Family Medicine

## 2018-03-17 ENCOUNTER — Ambulatory Visit (INDEPENDENT_AMBULATORY_CARE_PROVIDER_SITE_OTHER): Payer: Medicare Other | Admitting: Family Medicine

## 2018-03-17 VITALS — BP 134/79 | HR 85 | Temp 97.8°F | Ht 68.0 in | Wt 198.0 lb

## 2018-03-17 DIAGNOSIS — Z87891 Personal history of nicotine dependence: Secondary | ICD-10-CM

## 2018-03-17 DIAGNOSIS — Z0000 Encounter for general adult medical examination without abnormal findings: Secondary | ICD-10-CM | POA: Diagnosis not present

## 2018-03-17 NOTE — Patient Instructions (Signed)

## 2018-03-17 NOTE — Progress Notes (Signed)
Subjective:   Alan Hurst is a 72 y.o. male who presents for Medicare Annual/Subsequent preventive examination.   HPI - Medicare Wellness Visit: Uses the treadmill twice weekly, for about 15-20 minutes per use.  Feels that he has borderline hearing problems. Took the test and planned to get hearing aids at that time.  He did not. Test took place over a year ago, and was told he will eventually need hearing aids. States he changed insurance companies to help him pay for his eventual hearing aids.  Notes that his mood is good overall. States "I've been blessed.  I swear.  I get in sour moods, and I get depressed over Janet's condition, but it doesn't last long.  I just need to walk outside and do a quick gratitude check, and it's all better."  Confirms that he has good social network and activities outside of his home life.  Calls his circle of friends his "angel band."  Feels he has lack of energy at times.  Notes he wakes up several times in the night to take care of Marcie Bal. 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 Believes that his fatigue is mainly due to waking up several times in the night.   Notes "some days by 3 o 4 o'clock, I'm dead and I need to take a nap."  States some days he feels he is spinning his wheels and getting nothing accomplished. 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  Quit smoking in 1995. He cannot remember if he's had an abdominal ultrasond before.  In the past, patient notes that he attempted to start getting the shingles vaccine, but something happened to interfere, perhaps with insurance.  He continues to follow up with dermatology.  Feels his eczema is "under control, but it's always there."  Notes "I don't think it's  ever going to go away."  Activities of Daily Living In your present state of health, do you have difficulty performing the following activities?  1- Driving - no 2- Managing money - no 3- Feeding yourself - no 4- Getting from the bed to the chair - no 5- Climbing a flight of stairs - no 6- Preparing food and eating - no 7- Bathing or showering - no 8- Getting dressed - no 9- Getting to the toilet - no 10- Using the toilet - no 11- Moving around from place to place - no  Patient states that he feels safe at home.  Fall Risk  03/17/2018 06/14/2017 03/01/2017 10/26/2016 12/05/2015  Falls in the past year? 0 No No Yes No  Number falls in past yr: - - - 1 -  Injury with Fall? - - - Yes -  Risk Factor Category  - - - High Fall Risk -  Risk for fall due to : - - - History of fall(s);Impaired mobility -  Follow up - - - (No Data) -  Comment - - - patient is using a walker -   Current Exercise Habits: Home exercise routine, Type of exercise: treadmill, Frequency (Times/Week): 2, Intensity: Moderate    Depression screen [facility] 2/9 03/17/2018 06/28/2017 06/14/2017 04/05/2017 03/25/2017  Decreased Interest 0 0 0 0 0  Down, Depressed, Hopeless 0 0 0 0 0  PHQ - 2 Score  0 0 0 0 0  Altered sleeping 1 0 - 0 0  Tired, decreased energy 1 0 - 1 0  Change in appetite 0 0 - 0 0  Feeling bad or failure about yourself  0 0 - 0 0  Trouble concentrating 0 0 - 0 0  Moving slowly or fidgety/restless 0 0 - 0 0  Suicidal thoughts 0 0 - 0 0  PHQ-9 Score 2 0 - 1 0  Difficult doing work/chores Not difficult at all - - Not difficult at all -   6CIT Screen 03/17/2018  What Year? 0 points  What month? 0 points  What time? 0 points  Count back from 20 0 points  Months in reverse 0 points  Repeat phrase 0 points  Total Score 0    Functional Status Survey: Is the patient deaf or have difficulty hearing?: Yes Does the patient have difficulty seeing, even when wearing glasses/contacts?: No Does the patient have  difficulty concentrating, remembering, or making decisions?: No Does the patient have difficulty walking or climbing stairs?: No Does the patient have difficulty dressing or bathing?: No Does the patient have difficulty doing errands alone such as visiting a doctor's office or shopping?: No     Objective:    Vitals: BP 134/79   Pulse 85   Temp 97.8 F (36.6 C)   Ht 5\' 8"  (1.727 m)   Wt 198 lb (89.8 kg)   SpO2 99%   BMI 30.11 kg/m   Body mass index is 30.11 kg/m.  Advanced Directives 10/26/2016 08/05/2016 08/04/2016 08/04/2016 12/05/2015 06/28/2015  Does Patient Have a Medical Advance Directive? No No No No Yes No  Type of Advance Directive - - - - Living will;Healthcare Power of Attorney -  Would patient like information on creating a medical advance directive? No - Patient declined No - Patient declined No - Patient declined - - -    Tobacco Social History   Tobacco Use  Smoking Status Former Smoker  . Last attempt to quit: 05/11/1993  . Years since quitting: 24.8  Smokeless Tobacco Never Used     Counseling given: Not Answered   Past Medical History:  Diagnosis Date  . Diabetes ([place])   . Femur fracture, right ([place])   . GERD (gastroesophageal reflux disease) 12/05/2015   Well controlled on meds  . H/O: substance abuse ([place]) 1999  . Hip fracture ([place])   . HLD (hyperlipidemia) 12/05/2015   10+ yrs at least.   . 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 Vitamin B deficiency 12/05/2015  . Vitamin D insufficiency 12/05/2015   Past Surgical History:  Procedure Laterality Date  . CATARACT EXTRACTION, BILATERAL  10/2016  . FEMUR FRACTURE SURGERY    . HIP FRACTURE SURGERY    . JOINT REPLACEMENT Bilateral    hips  . TIBIA FRACTURE SURGERY    . 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 Alcohol abuse Mother   . Alcohol abuse Father   . Congestive Heart Failure Father   . Kidney disease Father   . Colon cancer Neg Hx   . Esophageal cancer Neg Hx   . Rectal cancer Neg Hx   . Stomach cancer Neg Hx    Social History   Socioeconomic History  .  Marital status: Married    Spouse name: Not on file  . Number of children: Not on file  . Years of education: Not on file  . Highest education level: Not on file  Occupational History  . Not on file  Social Needs  . Financial resource strain: Not on file  . Food insecurity:    Worry: Not on file    Inability: Not on file  . Transportation needs:    Medical: Not on file    Non-medical: Not on file  Tobacco Use  . Smoking status: Former Smoker    Last attempt to quit: 05/11/1993    Years since quitting: 24.8  . Smokeless tobacco: Never Used  Substance and Sexual Activity  . Alcohol use: No  . Drug use: No  . Sexual activity: Yes    Birth control/protection: None  Lifestyle  . Physical activity:    Days per week: Not on file    Minutes per session: Not on file  . Stress: Not on file  Relationships  . Social connections:    Talks on phone: Not on file    Gets together: Not on file    Attends religious service: Not on file    Active member of club or organization: Not on file    Attends meetings of clubs or organizations: Not on file    Relationship status: Not on file  Other Topics Concern  . Not on file  Social History Narrative  . Not on file    Outpatient Encounter Medications as of 03/17/2018  Medication Sig  . albuterol (PROVENTIL HFA;VENTOLIN HFA) 108 (90 Base) MCG/ACT inhaler Inhale 1-2 puffs into the lungs every 4 (four) hours as needed for wheezing or shortness of breath.  Marland Kitchen aspirin EC 81 MG tablet Take 81 mg by mouth daily.  . Cholecalciferol (VITAMIN D) 2000 units tablet Take 1 tablet by mouth daily.  . fluticasone (FLONASE) 50 MCG/ACT nasal spray Place 1 spray into both  nostrils 2 (two) times daily. After sinus rinse  . lisinopril (PRINIVIL,ZESTRIL) 2.5 MG tablet Take 1 tablet (2.5 mg total) by mouth daily.  Marland Kitchen lovastatin (MEVACOR) 40 MG tablet Take 1 tablet (40 mg total) by mouth at bedtime.  . magnesium oxide (MAG-OX) 400 MG tablet Take 400 mg by mouth daily.  . Melatonin 1 MG TABS Take 5 mg by mouth at bedtime.   . metFORMIN (GLUCOPHAGE) 500 MG tablet Take 1 tablet (500 mg total) by mouth daily.  . Omega-3 Fatty Acids (FISH OIL) 1000 MG CAPS Take 1 capsule by mouth daily.  Marland Kitchen omeprazole (PRILOSEC) 20 MG capsule Take 1 capsule (20 mg total) by mouth daily.   Facility-Administered Encounter Medications as of 03/17/2018  Medication  . 0.9 %  sodium chloride infusion    Activities of Daily Living In your present state of health, do you have any difficulty performing the following activities: 03/17/2018  Hearing? Y  Vision? N  Difficulty concentrating or making decisions? N  Walking or climbing stairs? N  Dressing or bathing? N  Doing errands, shopping? N  Some recent data might be hidden    Patient Care Team: [name], DO as PCP - General (Family Medicine) Christain [place], [place] as Referring Physician (Optometry) Lavonna Monarch, MD as Consulting Physician (Dermatology)   Assessment:   This is a routine wellness examination for [place].  Exercise Activities and Dietary recommendations Current Exercise Habits: Home exercise routine, Type of exercise: treadmill, Frequency (Times/Week): 2, Intensity: Moderate  Goals  None     Fall Risk Fall Risk  03/17/2018 06/14/2017 03/01/2017 10/26/2016 12/05/2015  Falls in the past year? 0 No No Yes No  Number falls in past yr: - - - 1 -  Injury with Fall? - - - Yes -  Risk Factor Category  - - - High Fall Risk -  Risk for fall due to : - - - History of fall(s);Impaired mobility -  Follow up - - - (No Data) -  Comment - - - patient is using a walker -   Is the patient's home free of loose throw rugs in  walkways, pet beds, electrical cords, etc?   yes      Grab bars in the bathroom? yes      Handrails on the stairs?   yes      Adequate lighting?   yes  Timed Get Up and Go Performed: passed   Depression Screen PHQ 2/9 Scores 03/17/2018 06/28/2017 06/14/2017 04/05/2017  PHQ - 2 Score 0 0 0 0  PHQ- 9 Score 2 0 - 1    Cognitive Function     6CIT Screen 03/17/2018  What Year? 0 points  What month? 0 points  What time? 0 points  Count back from 20 0 points  Months in reverse 0 points  Repeat phrase 0 points  Total Score 0    Immunization History  Administered Date(s) Administered  . Influenza Split 05/20/2012  . Influenza, High Dose Seasonal PF 03/31/2013, 05/10/2015, 03/02/2016, 03/01/2017, 02/15/2018  . Pneumococcal Conjugate-13 05/10/2015, 03/02/2016  . Pneumococcal Polysaccharide-23 03/31/2013  . Tdap 03/25/2017    Qualifies for Shingles Vaccine? Patient was given information to contact insurance in regards to coverage.   Screening Tests Health Maintenance  Topic Date Due  . HEMOGLOBIN A1C  12/26/2017  . OPHTHALMOLOGY EXAM  03/10/2018  . Hepatitis C Screening  10/26/2028 (Originally January 08, 1946)  . FOOT EXAM  03/25/2018  . COLONOSCOPY  05/27/2018  . TETANUS/TDAP  03/26/2027  . INFLUENZA VACCINE  Completed  . PNA vac Low Risk Adult  Completed   Cancer Screenings: Lung: Low Dose CT Chest recommended if Age 64-80 years, 30 pack-year currently smoking OR have quit w/in 15years. Patient does qualify. Colorectal: 05/27/17  Additional Screenings:  Hepatitis C Screening:      Plan:    Assessment & Plan:  - Advised patient to follow up regarding his need for hearing aids. - Recommended that the patient identify when he has the most difficulty hearing (high pitch, low pitch, etc).   - Discussed importance of tailoring hearing aids to personal needs.  - Encouraged patient to continue practicing active gratefulness and self-care. - Advised patient to keep up with his  hobbies and [place] patient to sleep as well as he can each day, given his constraints. - Reviewed adequate sleep hygiene with the patient today and discussed tips and tricks to help him obtain more rest.  - Need for AAA screen.  Patient former smoker; quit smoking in 1995.  Per pt, has never had this screening in the past.  - Reviewed need for shingles vaccine with patient today.  Patient willing to be UTD on all vaccinations.  - Advised patient to follow up with dermatology yearly for skin screenings and follow-up as advised.  - Health counseling and education provided.  I have personally reviewed and noted the following in the patient's chart:   . Medical and social history . Use of alcohol, tobacco or illicit  drugs  . Current medications and supplements . Functional ability and status . Nutritional status . Physical activity . Advanced directives . List of other physicians . Hospitalizations, surgeries, and ER visits in previous 12 months . Vitals . Screenings to include cognitive, depression, and falls . Referrals and appointments  In addition, I have reviewed and discussed with patient certain preventive protocols, quality metrics, and best practice recommendations. A written personalized care plan for preventive services as well as general preventive health recommendations were provided to patient.

## 2018-03-21 ENCOUNTER — Ambulatory Visit
Admission: RE | Admit: 2018-03-21 | Discharge: 2018-03-21 | Disposition: A | Discharge status: Home / Self Care | Payer: Medicare Other | Source: Ambulatory Visit | Attending: Family Medicine | Admitting: Family Medicine

## 2018-03-21 DIAGNOSIS — Z87891 Personal history of nicotine dependence: Secondary | ICD-10-CM

## 2018-06-16 ENCOUNTER — Encounter: Payer: Self-pay | Admitting: Gastroenterology

## 2018-07-18 ENCOUNTER — Ambulatory Visit (INDEPENDENT_AMBULATORY_CARE_PROVIDER_SITE_OTHER): Payer: Medicare Other | Admitting: Family Medicine

## 2018-07-18 ENCOUNTER — Encounter: Payer: Self-pay | Admitting: Family Medicine

## 2018-07-18 VITALS — BP 120/79 | HR 85 | Ht 68.0 in | Wt 201.0 lb

## 2018-07-18 DIAGNOSIS — N183 Chronic kidney disease, stage 3 unspecified: Secondary | ICD-10-CM

## 2018-07-18 DIAGNOSIS — I152 Hypertension secondary to endocrine disorders: Secondary | ICD-10-CM

## 2018-07-18 DIAGNOSIS — E559 Vitamin D deficiency, unspecified: Secondary | ICD-10-CM

## 2018-07-18 DIAGNOSIS — E782 Mixed hyperlipidemia: Secondary | ICD-10-CM

## 2018-07-18 DIAGNOSIS — E119 Type 2 diabetes mellitus without complications: Secondary | ICD-10-CM

## 2018-07-18 DIAGNOSIS — E1169 Type 2 diabetes mellitus with other specified complication: Secondary | ICD-10-CM | POA: Diagnosis not present

## 2018-07-18 DIAGNOSIS — E0822 Diabetes mellitus due to underlying condition with diabetic chronic kidney disease: Secondary | ICD-10-CM

## 2018-07-18 DIAGNOSIS — I10 Essential (primary) hypertension: Secondary | ICD-10-CM

## 2018-07-18 DIAGNOSIS — E539 Vitamin B deficiency, unspecified: Secondary | ICD-10-CM

## 2018-07-18 DIAGNOSIS — E1159 Type 2 diabetes mellitus with other circulatory complications: Secondary | ICD-10-CM | POA: Diagnosis not present

## 2018-07-18 DIAGNOSIS — J3089 Other allergic rhinitis: Secondary | ICD-10-CM

## 2018-07-18 DIAGNOSIS — L308 Other specified dermatitis: Secondary | ICD-10-CM

## 2018-07-18 LAB — POCT GLYCOSYLATED HEMOGLOBIN (HGB A1C): Hemoglobin A1C: 6.1 % — AB (ref 4.0–5.6)

## 2018-07-18 LAB — POCT UA - MICROALBUMIN
Albumin/Creatinine Ratio, Urine, POC: <30
Creatinine, POC: 50 mg/dL
Microalbumin Ur, POC: 10 mg/L

## 2018-07-18 MED ORDER — TRIAMCINOLONE ACETONIDE 0.1 % EX CREA: 1.0000 "application " | TOPICAL_CREAM | Freq: Two times a day (BID) | CUTANEOUS | 0 refills | Status: DC

## 2018-07-18 NOTE — Progress Notes (Signed)
Impression and Recommendations:    1. Type 2 diabetes mellitus without complication, without long-term current use of insulin ([place])   2. Hypertension associated with diabetes ([place])   3. 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, without long-term current use of insulin ([place])   4. Mixed diabetic hyperlipidemia associated with type 2 diabetes mellitus ([place])   5. Vitamin B deficiency   6. Vitamin D deficiency   7. Psoriasiform eczema   8. Environmental and seasonal allergies      - Need for fasting lab work in near future.  - Discussed need for patient to continue to obtain chronic management and screenings, and follow up with all established specialists.  Educated patient at length about the critical importance of keeping health maintenance up to date.  - Strongly encouraged patient to attend colonoscopy as scheduled.  - Participated in lengthy conversation and all questions were answered.  1. Type 2 diabetes mellitus without complication, without long-term current use of insulin ([facility]) - A1c today 6.1. - Per patient, less than two weeks ago, A1c was 5.7 with [facility] fundraiser. - Per patient, home health nurse measured his A1c and it was 5.7.   - We will double check patient's A1c in upcoming blood work.  - Counseled patient on pathophysiology of diabetes and discussed prudent dietary and lifestyle modifications as first line.  Importance of low carb, high protein diet discussed with patient in addition to regular exercise.   - Check FBS and 2 hours after the biggest meal of your day.  Keep log and bring in next OV for my review.  Also, if you ever feel poorly, please check your blood pressure and blood sugar, as one or the other could be the cause of your symptoms.  - Advised patient that he needs a diabetic eye exam yearly, and regular foot exams.  - Will continue to monitor.  2. Hypertension Associated with Diabetes Mellitus -  Stable at this time.  - Continue treatment plan as prescribed. - Patient tolerating meds well without complication.  Denies S-E  3. Hyperlipidemia Associated with Diabetes Mellitus - Last checked 02/22/2017. - Need for re-check in near future.  - Prescription Lovaza prescribed today. See med list. - Discontinue OTC fish oil.  - Otherwise, continue treatment plan as prescribed. - Patient tolerating meds well without complication.  Denies S-E  4. Caregiver Stress - Advised patient to speak with his wife and discuss options for assistance with care giving.  - Advised patient to speak with wife's specialists for further assistance. - Discussed that options for respite care should be discussed with his wife's neurologist.  - Strongly encouraged patient to take adequate care of himself.  - Advised patient to walk for 30 minutes daily, taking time for himself.  5. GERD - Stable on current treatment with omeprazole. - Continue treatment plan as prescribed. - Patient tolerating meds well without complication.  Denies S-E  6. Breathing  - Stable at this time.  - Continue sinus rinses and Flonase as recommended. - Continue treatment plan as prescribed.  - Will continue to monitor.  7. Vitamin D Deficiency, Insufficiency - Continue supplementation as prescribed.  See med list. - Patient tolerating meds well without complication.  Denies S-E - Will continue to monitor.  8. 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  - Will continue to monitor.  9. Skin Concerns - Eczema - Triamcinolone cream provided.  See med  list.  - Advised patient that if the area looks more yeasty and itchy on a lower extremity, patient should mix triamcinolone 50/50 with Lamisil cream.  - Discussed that for yeast or jock itch otherwise, patient should only use antifungal cream.   Education and routine counseling performed. Handouts provided.   Orders Placed This Encounter    Procedures  . CBC with Differential/Platelet  . Comprehensive metabolic panel  . Hemoglobin A1c  . Lipid panel  . T4, free  . TSH  . VITAMIN D 25 Hydroxy (Vit-D Deficiency, Fractures)  . B12  . POCT glycosylated hemoglobin (Hb A1C)  . POCT UA - Microalbumin  . HM Diabetes Foot Exam    Meds ordered this encounter  Medications  . triamcinolone cream (KENALOG) 0.1 %    Sig: Apply 1 application topically 2 (two) times daily.    Dispense:  453.6 g    Refill:  0    The patient was counseled, risk factors were discussed, anticipatory guidance given.  Gross side effects, risk and benefits, and alternatives of medications discussed with patient.  Patient is aware that all medications have potential side effects and we are unable to predict every side effect or drug-drug interaction that may occur.  Expresses verbal understanding and consents to current therapy plan and treatment regimen.   Return for 2) 53mo f/up DM, BP, CKD;  ALSO - FBW next couple of days.   Please see AVS handed out to patient at the end of our visit for further patient instructions/ counseling done pertaining to today's office visit.    Note:  This document was prepared using Dragon voice recognition software and may include unintentional dictation errors.   This document serves as a record of services personally performed by [name], DO. It was created on her behalf by Toni Amend, a trained medical scribe. The creation of this record is based on the scribe's personal observations and the provider's statements to them.   I have reviewed the above medical documentation for accuracy and completeness and I concur.  [name], DO 07/19/2018 7:48 PM        Subjective:    Chief Complaint  Patient presents with  . Follow-up    Alan Hurst is a 73 y.o. male who presents to [facility] today for Diabetes Management.    Per patient, he is feeling well,  mainly worried about the health of his wife Alan Hurst.  He has not had his diabetic eye exam recently.  Per patient, needs colonoscopy in near future "because I think they found a node or something."  Takes omeprazole and Vitamin D daily.  Caregiver Stress Today, patient is feeling frustrated and stressed out about the quality of his wife's recent ED visit.  States they went there for constipation and had an unsatisfactory experience.  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  He is required to take care of everything in the house, and notes he does not obtain adequate sleep due to the fact that his wife requires care on-and-off all night.    Notes every now and then he feels distressed, but not often.  He "just trudges through it."  DM HPI: -  He has been working on diet and exercise for diabetes  He has to do everything now, for his wife.  Notes that he's up and down the hall for 20 minutes every day.  Pt is currently maintained on  the following medications for diabetes:   see med list today Medication compliance - continues on treatment plan as recommended.  Home glucose readings range - runs around 100 fasting, once per week.  Notes "it never varies from high 90's to 110, always in that range."   Denies polyuria/polydipsia. Denies hypo/ hyperglycemia symptoms - He denies new onset of: chest pain, exercise intolerance, shortness of breath, dizziness, visual changes, headache, lower extremity swelling or claudication.   Last diabetic eye exam was No results found for: [id number]  Foot exam- UTD  Last A1C in the office was:  Lab Results  Component Value Date   HGBA1C 6.1 (A) 07/18/2018   HGBA1C 5.9 06/28/2017   HGBA1C 5.7 (H) 02/22/2017    Lab Results  Component Value Date   MICROALBUR 10 07/18/2018   LDLCALC 62 02/22/2017   CREATININE 1.03 02/22/2017   1. HTN HPI:  -  His blood pressure has been controlled at home.  Pt is  checking it at home.  Notes "it's never high."  - Patient reports good compliance with blood pressure medications  - Denies medication S-E   - Smoking Status noted   - He denies new onset of: chest pain, exercise intolerance, shortness of breath, dizziness, visual changes, headache, lower extremity swelling or claudication.   Last 3 blood pressure readings in our office are as follows: BP Readings from Last 3 Encounters:  07/18/18 120/79  03/17/18 134/79  02/15/18 110/66    Filed Weights   07/18/18 1130  Weight: 201 lb (91.2 kg)     BMI Readings from Last 3 Encounters:  07/18/18 30.56 kg/m  03/17/18 30.11 kg/m  10/27/17 29.95 kg/m     No problems updated.    Patient Care Team    Relationship Specialty Notifications Start End  [name], DO PCP - General Family Medicine  11/21/15   Christain Sacramento, [place] Referring Physician Optometry  03/02/16    Comment: Blanche East, MD Consulting Physician Dermatology  10/26/16      Patient Active Problem List   Diagnosis Date Noted  . 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 Mixed diabetic hyperlipidemia associated with type 2 diabetes mellitus ([place]) 03/01/2017    Priority: High  . Hypertension associated with diabetes ([place]) 03/01/2017    Priority: High  . Type 2 diabetes mellitus without complication, without long-term current use of insulin ([place]) 12/05/2015    Priority: High  . Chronic kidney disease- stage 3a 01/02/2016    Priority: Medium  . HLD (hyperlipidemia) 12/05/2015    Priority: Medium  . Recovering alcoholic in remission ([place]) 12/05/2015    Priority: Medium  . History of tobacco abuse 12/05/2015    Priority: Medium  . GERD (gastroesophageal reflux disease) 12/05/2015    Priority: Low  . Sleep disorder 12/05/2015    Priority: Low  . Vitamin D insufficiency 12/05/2015     Priority: Low  . Vitamin B deficiency 12/05/2015    Priority: Low  . Environmental and seasonal allergies 06/14/2017  . Bite, insect 04/05/2017  . Health education/counseling 03/25/2017  . Allergic rhinitis 03/25/2017  . Cough in adult at night primarily when lying down 03/01/2017  . Vitamin D deficiency 03/01/2017  . Rash and nonspecific skin eruption 09/22/2016  . 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 Fall   . 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 Essential hypertension   . Femur fracture ([place]) 08/04/2016  . 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 Diabetes ([place])   . Femur fracture, right ([place])   . GERD (gastroesophageal reflux disease) 12/05/2015   Well controlled on meds  . H/O: substance abuse ([place]) 1999  . Hip fracture ([place])   . HLD (hyperlipidemia) 12/05/2015   10+ yrs at least.   . 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   [facility]  . 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 Vitamin B deficiency 12/05/2015  . Vitamin D insufficiency 12/05/2015     Past Surgical History:  Procedure Laterality Date  . CATARACT EXTRACTION, BILATERAL  10/2016  . FEMUR FRACTURE SURGERY    . HIP FRACTURE SURGERY    . JOINT REPLACEMENT Bilateral    hips  . TIBIA FRACTURE SURGERY    . TOTAL HIP REVISION Right 08/07/2016   Procedure: RIGHT TOTAL HIP REVISION;  Surgeon: Rod Can, MD;  Location: [facility];  Service: Orthopedics;  Laterality: Right;     Family History  Problem Relation Age of Onset  . Alcohol abuse Mother   . Alcohol abuse Father   . Congestive Heart Failure Father   . Kidney disease Father   . Colon cancer Neg Hx   . Esophageal cancer Neg Hx   . Rectal cancer Neg Hx   . Stomach cancer Neg Hx      Social History   Substance and Sexual Activity  Drug Use No  ,  Social History   Substance and Sexual Activity  Alcohol Use No  ,  Social History    Tobacco Use  Smoking Status Former Smoker  . Last attempt to quit: 05/11/1993  . Years since quitting: 25.2  Smokeless Tobacco Never Used  ,    Current Outpatient Medications on File Prior to Visit  Medication Sig Dispense Refill  . albuterol (PROVENTIL HFA;VENTOLIN HFA) 108 (90 Base) MCG/ACT inhaler Inhale 1-2 puffs into the lungs every 4 (four) hours as needed for wheezing or shortness of breath. 1 Inhaler 2  . aspirin EC 81 MG tablet Take 81 mg by mouth daily.    . Cholecalciferol (VITAMIN D) 2000 units tablet Take 1 tablet by mouth daily.    . fluticasone (FLONASE) 50 MCG/ACT nasal spray Place 1 spray into both nostrils 2 (two) times daily. After sinus rinse 16 g 6  . lisinopril (PRINIVIL,ZESTRIL) 2.5 MG tablet Take 1 tablet (2.5 mg total) by mouth daily. 90 tablet 3  . lovastatin (MEVACOR) 40 MG tablet Take 1 tablet (40 mg total) by mouth at bedtime. 90 tablet 3  . magnesium oxide (MAG-OX) 400 MG tablet Take 400 mg by mouth daily.    . Melatonin 1 MG TABS Take 5 mg by mouth at bedtime.     . metFORMIN (GLUCOPHAGE) 500 MG tablet Take 1 tablet (500 mg total) by mouth daily. 90 tablet 3  . Omega-3 Fatty Acids (FISH OIL) 1000 MG CAPS Take 1 capsule by mouth daily.    Marland Kitchen omeprazole (PRILOSEC) 20 MG capsule Take 1 capsule (20 mg total) by mouth daily. 90 capsule 3   Current Facility-Administered Medications on File Prior to Visit  Medication Dose Route Frequency Provider Last Rate Last Dose  . 0.9 %  sodium chloride infusion  500 mL Intravenous Continuous Armbruster, Carlota Raspberry, MD         Allergies  Allergen Reactions  . No Known Allergies  Review of Systems:   General:  Denies fever, chills Optho/Auditory:   Denies visual changes, blurred vision Respiratory:   Denies SOB, cough, wheeze, DIB  Cardiovascular:   Denies chest pain, palpitations, painful respirations Gastrointestinal:   Denies nausea, vomiting, diarrhea.  Endocrine:     Denies new hot or cold  intolerance Musculoskeletal:  Denies joint swelling, gait issues, or new unexplained myalgias/ arthralgias Skin:  Denies rash, suspicious lesions  Neurological:    Denies dizziness, unexplained weakness, numbness  Psychiatric/Behavioral:   Denies mood changes    Objective:     Blood pressure 120/79, pulse 85, height 5\' 8"  (1.727 m), weight 201 lb (91.2 kg), SpO2 95 %.  Body mass index is 30.56 kg/m.  General: Well Developed, well nourished, and in no acute distress.  HEENT: Normocephalic, atraumatic, pupils equal round reactive to light, neck supple, No carotid bruits, no JVD Skin: Warm and dry, cap RF less 2 sec Cardiac: Regular rate and rhythm, S1, S2 WNL's, no murmurs rubs or gallops Respiratory: ECTA B/L, Not using accessory muscles, speaking in full sentences. NeuroM-Sk: Ambulates w/o assistance, moves ext * 4 w/o difficulty, sensation grossly intact.  Ext: scant edema b/l lower ext Psych: No HI/SI, judgement and insight good, Euthymic mood. Full Affect.

## 2018-07-18 NOTE — Patient Instructions (Signed)

## 2018-07-20 ENCOUNTER — Other Ambulatory Visit: Payer: Medicare Other

## 2018-07-20 ENCOUNTER — Other Ambulatory Visit: Payer: Self-pay

## 2018-07-20 DIAGNOSIS — E782 Mixed hyperlipidemia: Secondary | ICD-10-CM

## 2018-07-20 DIAGNOSIS — I10 Essential (primary) hypertension: Secondary | ICD-10-CM

## 2018-07-20 DIAGNOSIS — E1169 Type 2 diabetes mellitus with other specified complication: Secondary | ICD-10-CM

## 2018-07-20 DIAGNOSIS — E119 Type 2 diabetes mellitus without complications: Secondary | ICD-10-CM

## 2018-07-20 DIAGNOSIS — E1159 Type 2 diabetes mellitus with other circulatory complications: Secondary | ICD-10-CM

## 2018-07-20 DIAGNOSIS — E559 Vitamin D deficiency, unspecified: Secondary | ICD-10-CM

## 2018-07-20 DIAGNOSIS — E539 Vitamin B deficiency, unspecified: Secondary | ICD-10-CM

## 2018-07-20 DIAGNOSIS — N183 Chronic kidney disease, stage 3 unspecified: Secondary | ICD-10-CM

## 2018-07-20 DIAGNOSIS — E0822 Diabetes mellitus due to underlying condition with diabetic chronic kidney disease: Secondary | ICD-10-CM

## 2018-07-21 LAB — CBC WITH DIFFERENTIAL/PLATELET
BASOS ABS: 0.1 10*3/uL (ref 0.0–0.2)
Basos: 1 %
EOS (ABSOLUTE): 0.2 10*3/uL (ref 0.0–0.4)
Eos: 3 %
Hematocrit: 44.8 % (ref 37.5–51.0)
Hemoglobin: 14.4 g/dL (ref 13.0–17.7)
IMMATURE GRANS (ABS): 0 10*3/uL (ref 0.0–0.1)
IMMATURE GRANULOCYTES: 0 %
LYMPHS: 31 %
Lymphocytes Absolute: 1.9 10*3/uL (ref 0.7–3.1)
MCH: 27.1 pg (ref 26.6–33.0)
MCHC: 32.1 g/dL (ref 31.5–35.7)
MCV: 84 fL (ref 79–97)
MONOS ABS: 0.8 10*3/uL (ref 0.1–0.9)
Monocytes: 12 %
Neutrophils Absolute: 3.2 10*3/uL (ref 1.4–7.0)
Neutrophils: 53 %
PLATELETS: 300 10*3/uL (ref 150–450)
RBC: 5.31 x10E6/uL (ref 4.14–5.80)
RDW: 13.4 % (ref 11.6–15.4)
WBC: 6.2 10*3/uL (ref 3.4–10.8)

## 2018-07-21 LAB — HEMOGLOBIN A1C
Est. average glucose Bld gHb Est-mCnc: 123 mg/dL
HEMOGLOBIN A1C: 5.9 % — AB (ref 4.8–5.6)

## 2018-07-21 LAB — LIPID PANEL
CHOL/HDL RATIO: 2.9 ratio (ref 0.0–5.0)
Cholesterol, Total: 146 mg/dL (ref 100–199)
HDL: 51 mg/dL (ref >39)
LDL CALC: 73 mg/dL (ref 0–99)
Triglycerides: 110 mg/dL (ref 0–149)
VLDL CHOLESTEROL CAL: 22 mg/dL (ref 5–40)

## 2018-07-21 LAB — COMPREHENSIVE METABOLIC PANEL
ALK PHOS: 64 IU/L (ref 39–117)
ALT: 20 IU/L (ref 0–44)
AST: 18 IU/L (ref 0–40)
Albumin/Globulin Ratio: 1.7 (ref 1.2–2.2)
Albumin: 4.1 g/dL (ref 3.7–4.7)
BILIRUBIN TOTAL: 0.4 mg/dL (ref 0.0–1.2)
BUN/Creatinine Ratio: 11 (ref 10–24)
BUN: 14 mg/dL (ref 8–27)
CHLORIDE: 103 mmol/L (ref 96–106)
CO2: 25 mmol/L (ref 20–29)
Calcium: 9.8 mg/dL (ref 8.6–10.2)
Creatinine, Ser: 1.32 mg/dL — ABNORMAL HIGH (ref 0.76–1.27)
GFR calc Af Amer: 62 mL/min/{1.73_m2} (ref >59)
GFR calc non Af Amer: 54 mL/min/{1.73_m2} — ABNORMAL LOW (ref >59)
GLOBULIN, TOTAL: 2.4 g/dL (ref 1.5–4.5)
Glucose: 105 mg/dL — ABNORMAL HIGH (ref 65–99)
POTASSIUM: 5 mmol/L (ref 3.5–5.2)
SODIUM: 141 mmol/L (ref 134–144)
Total Protein: 6.5 g/dL (ref 6.0–8.5)

## 2018-07-21 LAB — T4, FREE: FREE T4: 1.34 ng/dL (ref 0.82–1.77)

## 2018-07-21 LAB — VITAMIN D 25 HYDROXY (VIT D DEFICIENCY, FRACTURES): Vit D, 25-Hydroxy: 37.3 ng/mL (ref 30.0–100.0)

## 2018-07-21 LAB — VITAMIN B12: VITAMIN B 12: 684 pg/mL (ref 232–1245)

## 2018-07-21 LAB — TSH: TSH: 1.17 u[IU]/mL (ref 0.450–4.500)

## 2018-07-25 ENCOUNTER — Other Ambulatory Visit: Payer: Self-pay

## 2018-07-25 DIAGNOSIS — I152 Hypertension secondary to endocrine disorders: Secondary | ICD-10-CM

## 2018-07-25 DIAGNOSIS — E119 Type 2 diabetes mellitus without complications: Secondary | ICD-10-CM

## 2018-07-25 DIAGNOSIS — E0822 Diabetes mellitus due to underlying condition with diabetic chronic kidney disease: Secondary | ICD-10-CM

## 2018-07-25 DIAGNOSIS — N183 Chronic kidney disease, stage 3 (moderate): Secondary | ICD-10-CM

## 2018-07-25 DIAGNOSIS — I10 Essential (primary) hypertension: Principal | ICD-10-CM

## 2018-07-25 DIAGNOSIS — E1159 Type 2 diabetes mellitus with other circulatory complications: Secondary | ICD-10-CM

## 2018-08-07 ENCOUNTER — Other Ambulatory Visit: Payer: Self-pay | Admitting: Family Medicine

## 2018-08-07 DIAGNOSIS — J3089 Other allergic rhinitis: Secondary | ICD-10-CM

## 2018-08-07 DIAGNOSIS — J309 Allergic rhinitis, unspecified: Secondary | ICD-10-CM

## 2018-08-30 ENCOUNTER — Other Ambulatory Visit: Payer: Self-pay | Admitting: Family Medicine

## 2018-08-30 DIAGNOSIS — K219 Gastro-esophageal reflux disease without esophagitis: Secondary | ICD-10-CM

## 2018-08-30 DIAGNOSIS — E119 Type 2 diabetes mellitus without complications: Secondary | ICD-10-CM

## 2018-08-30 DIAGNOSIS — N183 Chronic kidney disease, stage 3 unspecified: Secondary | ICD-10-CM

## 2018-08-30 DIAGNOSIS — E782 Mixed hyperlipidemia: Secondary | ICD-10-CM

## 2018-09-20 ENCOUNTER — Other Ambulatory Visit: Payer: Self-pay | Admitting: Family Medicine

## 2018-09-20 DIAGNOSIS — E1169 Type 2 diabetes mellitus with other specified complication: Secondary | ICD-10-CM

## 2018-09-20 MED ORDER — OMEGA-3-ACID ETHYL ESTERS 1 G PO CAPS: 2.0000 g | ORAL_CAPSULE | Freq: Two times a day (BID) | ORAL | 3 refills | Status: DC

## 2018-09-26 ENCOUNTER — Other Ambulatory Visit: Payer: Self-pay | Admitting: Family Medicine

## 2018-09-26 DIAGNOSIS — J309 Allergic rhinitis, unspecified: Secondary | ICD-10-CM

## 2018-09-26 DIAGNOSIS — J3089 Other allergic rhinitis: Secondary | ICD-10-CM

## 2018-11-17 ENCOUNTER — Other Ambulatory Visit: Payer: Self-pay

## 2018-11-17 ENCOUNTER — Ambulatory Visit (INDEPENDENT_AMBULATORY_CARE_PROVIDER_SITE_OTHER): Payer: Medicare Other | Admitting: Family Medicine

## 2018-11-17 ENCOUNTER — Encounter: Payer: Self-pay | Admitting: Family Medicine

## 2018-11-17 VITALS — BP 131/67 | Temp 98.6°F | Ht 68.0 in | Wt 197.0 lb

## 2018-11-17 DIAGNOSIS — E1169 Type 2 diabetes mellitus with other specified complication: Secondary | ICD-10-CM | POA: Diagnosis not present

## 2018-11-17 DIAGNOSIS — N183 Chronic kidney disease, stage 3 unspecified: Secondary | ICD-10-CM

## 2018-11-17 DIAGNOSIS — E0822 Diabetes mellitus due to underlying condition with diabetic chronic kidney disease: Secondary | ICD-10-CM | POA: Diagnosis not present

## 2018-11-17 DIAGNOSIS — F4321 Adjustment disorder with depressed mood: Secondary | ICD-10-CM

## 2018-11-17 DIAGNOSIS — E559 Vitamin D deficiency, unspecified: Secondary | ICD-10-CM

## 2018-11-17 DIAGNOSIS — E1159 Type 2 diabetes mellitus with other circulatory complications: Secondary | ICD-10-CM | POA: Diagnosis not present

## 2018-11-17 DIAGNOSIS — E782 Mixed hyperlipidemia: Secondary | ICD-10-CM

## 2018-11-17 DIAGNOSIS — I10 Essential (primary) hypertension: Secondary | ICD-10-CM

## 2018-11-17 MED ORDER — FLUOXETINE HCL 10 MG PO TABS: ORAL_TABLET | ORAL | 0 refills | Status: DC

## 2018-11-17 NOTE — Progress Notes (Signed)
Virtual / live video office visit note for [employer], D.O- Primary Care Physician at [facility]   I connected with current patient today and beyond visually recognizing the correct individual, I verified that I am speaking with the correct person using two identifiers.  . Location of the patient: Home . Location of the provider: Office Only the patient (+/- their family members at pt's discretion) and myself were participating in the encounter    - This visit type was conducted due to national recommendations for restrictions regarding the COVID-19 Pandemic (e.g. social distancing) in an effort to limit this patient's exposure and mitigate transmission in our community.  This format is felt to be most appropriate for this patient at this time.   - The patient did have access to video technology today yet, we had technical difficulties with this method, requiring transitioning to audio only.    - No physical exam could be performed with this format, beyond that communicated to [place] by the patient/ family members as noted.   - Additionally my office staff/ schedulers discussed with the patient that there may be a monetary charge related to this service, depending on patient's medical insurance.   The patient expressed understanding, and agreed to proceed.      History of Present Illness:   Mood:  Emotionally ok thus far.  Sleep patterns still "musician's hours".    Taking naps during day  HLD:  lovaza- too expensive over $300- crill oil 500mg  daily -    PT Will inc to 1000 bid  Depression screen [facility] 2/9 11/17/2018 03/17/2018 06/28/2017 06/14/2017 04/05/2017  Decreased Interest 0 0 0 0 0  Down, Depressed, Hopeless 1 0 0 0 0  PHQ - 2 Score 1 0 0 0 0  Altered sleeping 3 1 0 - 0  Tired, decreased energy 2 1 0 - 1  Change in appetite 0 0 0 - 0  Feeling bad or failure about yourself  0 0 0 - 0  Trouble concentrating 0 0 0 - 0  Moving slowly or fidgety/restless 0 0 0 - 0  Suicidal  thoughts 0 0 0 - 0  PHQ-9 Score 6 2 0 - 1  Difficult doing work/chores Not difficult at all Not difficult at all - - Not difficult at all    Chronic kidney disease:--Last labs drawn 3 of 2020, we saw a bump in his serum creatinine and he was told to come in in 3 months for recheck.  He has not done that.  He was told to avoid Advil Aleve etc.   Diabetes: Lab Results  Component Value Date   HGBA1C 5.9 (H) 07/20/2018   HGBA1C 6.1 (A) 07/18/2018   HGBA1C 5.9 06/28/2017    Blood pressure:   Been running 149/77, checking.   Usually 120/80 or so - checks it once per mo.     Vitamin D deficiency:   Was low when last checked in March, told to double his supplements over-the-counter.  To [phone] IUs     Impression and Recommendations:    1. 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, without long-term current use of insulin ([place])   2. Mixed diabetic hyperlipidemia associated with type 2 diabetes mellitus ([place])   3. Hypertension associated with diabetes ([place])   4. Stage 3 chronic kidney disease ([place])   5. Vitamin D insufficiency   6. Adjustment disorder with depressed mood     -Come  in in the near future for A1c, CMP and vit D.  His serum creatinine had jumped a little 4 months ago from 1.03 up to 1.32.   GFR went from 73 down to 54 with this.  Patient was told to follow-up and get this rechecked in 3 months and did not.  Told him to schedule this in the very near future, counseling done re: causes for this, avoiding nephrotoxic substances etc  - HLD: pt will quadruple his Crill oil intake since lovaza too costly   - increased vit D intake from 2000 to 4000-5,000 iu daily  - check BP more freq-- 3-4/ wk and write down BP/HR and let me know what no. are each OV  - mood:  no SI/HI but thinks "a little medicine would help" ; stressed w covid and lack of socialization etc.  START PROZAC- low dose after R/B d/c pt etc. - Counseled patient on  pathophysiology of disease and discussed various treatment options, which often includes dietary and lifestyle modifications as first line, in addition to discussing the risks and benefits of various medications.  - Importance of healthy eating, getting adequate sleep, daily exercise and seeking the help of a professional counselor discussed.  Pt encouraged to call one for an appt for counseling.  - Meditation and relaxation techniques discussed with patient.   Deep breathing/ Square breathing exercises reviewed.  - Encouraged daily meditation and regular exercise - Anticipatory guidance given and pt was encouraged to return to clinic or call the office with any further questions or concerns.  - As part of my medical decision making, I reviewed the following data within the [place] History obtained from pt /family, CMA notes reviewed and incorporated if applicable, Labs reviewed, Radiograph/ tests reviewed if applicable and OV notes from prior OV's with me, as well as other specialists she/he has seen since seeing me last, were all reviewed and used in my medical decision making process today.   - Additionally, discussion had with patient regarding txmnt plan, their biases about that plan etc were used in my medical decision making today.   - The patient agreed with the plan and demonstrated an understanding of the instructions.   No barriers to understanding were identified.   - Red flag symptoms and signs discussed in detail.  Patient expressed understanding regarding what to do in case of emergency\ urgent symptoms.  The patient was advised to call back or seek an in-person evaluation if the symptoms worsen or if the condition fails to improve as anticipated.   Return for 6-8 wks with me- starting prozac; f/up .    Orders Placed This Encounter  Procedures  . Hemoglobin A1c  . Comprehensive metabolic panel  . VITAMIN D 25 Hydroxy (Vit-D Deficiency, Fractures)    Meds  ordered this encounter  Medications  . FLUoxetine (PROZAC) 10 MG tablet    Sig: Use one half tablet daily for 1 week then increase to 1 tablet daily    Dispense:  90 tablet    Refill:  0    Medications Discontinued During This Encounter  Medication Reason  . omega-3 acid ethyl esters (LOVAZA) 1 g capsule Cost of medication  . omega-3 acid ethyl esters (LOVAZA) 1 g capsule Cost of medication      I provided 23 minutes of non-face-to-face time during this encounter,with over 50% of the time in direct counseling on patients medical conditions/ medical concerns.  Additional time was spent with charting and coordination  of care after the actual visit commenced.   Note:  This note was prepared with assistance of Dragon voice recognition software. Occasional wrong-word or sound-a-like substitutions may have occurred due to the inherent limitations of voice recognition software.  [name], DO 11/17/2018 1:42 AM   Patient Care Team    Relationship Specialty Notifications Start End  [name], DO PCP - General Family Medicine  11/21/15   Christain Sacramento, [place] Referring Physician Optometry  03/02/16    Comment: Blanche East, MD Consulting Physician Dermatology  10/26/16     -Vitals obtained; medications/ allergies reconciled;  personal medical, social, Sx etc.histories were updated by CMA, reviewed by me and are reflected in chart  Patient Active Problem List   Diagnosis Date Noted  . 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, without long-term current use of insulin ([place]) 03/01/2017    Priority: High  . Mixed diabetic hyperlipidemia associated with type 2 diabetes mellitus ([place]) 03/01/2017    Priority: High  . Hypertension associated with diabetes ([place]) 03/01/2017    Priority: High  . Type 2 diabetes mellitus without complication, without long-term current use of insulin ([place]) 12/05/2015    Priority: High  . Chronic kidney  disease- stage 3a 01/02/2016    Priority: Medium  . Recovering alcoholic in remission ([place]) 12/05/2015    Priority: Medium  . History of tobacco abuse 12/05/2015    Priority: Medium  . GERD (gastroesophageal reflux disease) 12/05/2015    Priority: Low  . Sleep disorder 12/05/2015    Priority: Low  . Vitamin D insufficiency 12/05/2015    Priority: Low  . Vitamin B deficiency 12/05/2015    Priority: Low  . Environmental and seasonal allergies 06/14/2017  . Bite, insect 04/05/2017  . Health education/counseling 03/25/2017  . Allergic rhinitis 03/25/2017  . Cough in adult at night primarily when lying down 03/01/2017  . Vitamin D deficiency 03/01/2017  . Rash and nonspecific skin eruption 09/22/2016  . 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 Fall   . Essential hypertension   . Femur fracture ([place]) 08/04/2016  . 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 albuterol (PROVENTIL HFA;VENTOLIN HFA) 108 (90 Base) MCG/ACT inhaler Inhale 1-2 puffs into the lungs every 4 (four) hours as needed for wheezing or shortness of breath.  Marland Kitchen aspirin EC 81 MG tablet Take 81 mg by mouth daily.  . Cholecalciferol (VITAMIN D) 2000 units tablet Take 1 tablet by mouth daily.  . fluticasone (FLONASE) 50 MCG/ACT nasal spray USE ONE SPRAY IN EACH NOSTRIL TWICE DAILY AFTER SINUS RINSE  . lisinopril (ZESTRIL) 2.5 MG tablet TAKE 1 TABLET BY MOUTH  DAILY  . lovastatin (MEVACOR) 40 MG tablet TAKE 1 TABLET BY MOUTH AT  BEDTIME  . magnesium oxide (MAG-OX) 400 MG tablet Take 400 mg by mouth daily.  . Melatonin 1 MG TABS Take 5 mg by mouth at bedtime.   . Omega-3 Fatty Acids (FISH OIL) 1000 MG CAPS Take 1 capsule by mouth daily.  Marland Kitchen omeprazole (PRILOSEC) 20 MG capsule TAKE 1 CAPSULE BY MOUTH  DAILY  . triamcinolone cream (KENALOG) 0.1 % Apply 1 application topically 2 (two) times daily.  . [DISCONTINUED] metFORMIN (GLUCOPHAGE) 500 MG tablet TAKE 1  TABLET BY MOUTH  DAILY   Current Facility-Administered Medications for the 11/17/18 encounter (Office Visit) with [name], DO  Medication  . 0.9 %  sodium chloride infusion  Allergies  Allergen Reactions  . No Known Allergies      ROS:  See above HPI for pertinent positives and negatives   Objective:   Blood pressure 131/67, temperature 98.6 F (37 C), height 5\' 8"  (1.727 m), weight 197 lb (89.4 kg).  (if some vitals are omitted, this means that patient was UNABLE to obtain them even though they were asked to get them prior to OV today.  They were asked to call us at their earliest convenience with these once obtained.)  General: A & O * 3; visually in no acute distress; in usual state of health.  Skin: Visible skin appears normal and pt's usual skin color HEENT:  EOMI, head is normocephalic and atraumatic.  Sclera are anicteric. Neck has a good range of motion.  Lips are noncyanotic Chest: normal chest excursion and movement Respiratory: speaking in full sentences, no conversational dyspnea; no use of accessory muscles Psych: insight good, mood- appears full

## 2018-11-28 ENCOUNTER — Other Ambulatory Visit: Payer: Self-pay | Admitting: Family Medicine

## 2018-11-28 DIAGNOSIS — E119 Type 2 diabetes mellitus without complications: Secondary | ICD-10-CM

## 2019-01-16 ENCOUNTER — Other Ambulatory Visit: Payer: Self-pay | Admitting: Family Medicine

## 2019-01-16 DIAGNOSIS — N183 Chronic kidney disease, stage 3 unspecified: Secondary | ICD-10-CM

## 2019-01-16 DIAGNOSIS — E782 Mixed hyperlipidemia: Secondary | ICD-10-CM

## 2019-01-16 DIAGNOSIS — K219 Gastro-esophageal reflux disease without esophagitis: Secondary | ICD-10-CM

## 2019-01-16 DIAGNOSIS — E119 Type 2 diabetes mellitus without complications: Secondary | ICD-10-CM

## 2019-02-07 ENCOUNTER — Ambulatory Visit (INDEPENDENT_AMBULATORY_CARE_PROVIDER_SITE_OTHER): Payer: Medicare Other | Admitting: Family Medicine

## 2019-02-07 ENCOUNTER — Encounter: Payer: Self-pay | Admitting: Family Medicine

## 2019-02-07 ENCOUNTER — Other Ambulatory Visit: Payer: Self-pay

## 2019-02-07 DIAGNOSIS — F4321 Adjustment disorder with depressed mood: Secondary | ICD-10-CM | POA: Diagnosis not present

## 2019-02-07 DIAGNOSIS — J309 Allergic rhinitis, unspecified: Secondary | ICD-10-CM | POA: Diagnosis not present

## 2019-02-07 DIAGNOSIS — J3089 Other allergic rhinitis: Secondary | ICD-10-CM

## 2019-02-07 MED ORDER — FLUTICASONE PROPIONATE 50 MCG/ACT NA SUSP: NASAL | 6 refills | Status: DC

## 2019-02-07 MED ORDER — FLUOXETINE HCL 10 MG PO TABS: 10.0000 mg | ORAL_TABLET | Freq: Every day | ORAL | 0 refills | Status: DC

## 2019-02-07 NOTE — Progress Notes (Signed)
Telehealth office visit note for Alan Hurst, D.O- at Primary Care at [facility]   I connected with current patient today and verified that I am speaking with the correct person using two identifiers.   . Location of the patient: Home . Location of the provider: Office Only the patient (+/- their family members at pt's discretion) and myself were participating in the encounter    - This visit type was conducted due to national recommendations for restrictions regarding the COVID-19 Pandemic (e.g. social distancing) in an effort to limit this patient's exposure and mitigate transmission in our community.  This format is felt to be most appropriate for this patient at this time.   - The patient did not have access to video technology or had technical difficulties with video requiring transitioning to audio format only. - No physical exam could be performed with this format, beyond that communicated to [place] by the patient/ family members as noted.   - Additionally my office staff/ schedulers discussed with the patient that there may be a monetary charge related to this service, depending on their medical insurance.   The patient expressed understanding, and agreed to proceed.       History of Present Illness:  - Mood Management Patient states he's feeling a little more like himself again after starting on the Prozac last office visit..  Says "I'm not even sure I really need to be on [the medication] from now on.  I've been thinking about it, and being depressed is almost a natural state these days."    Says "even though I might be depressed, I'm not depressed about being depressed, because it's almost normal."  Says "If you're paying attention to the world around you, you can't help but be a little depressed."  States he "doesn't feel much different" on the meds and "I feel like sometimes I'm kinda dulled out; I sit here and zone out on [employer]."     States "sometimes I just sit here  and kind of not do anything."  Says he's trying to be more like himself, "sometimes it's a challenge," and notes "I've never been on antidepressants before and I'm really a free spirit kind of guy, but sometimes it's just tough."  Says he thinks the meds have helped him mentally "maybe a little bit."    Says "I don't have a problem staying on it, really; I'll go either way, really."  Notes walks up and down the hall "about a hundred times" but not engaging in additional exercise.   - Environmental & Allergic Rhinitis Per pt, uses Flonase maybe once or twice per week, "not every day, only when I'm clogged up."  Says it works well for him  Confirms that he uses sinus rinses also to keep himself clear.    GAD 7 : Generalized Anxiety Score 11/17/2018  Nervous, Anxious, on Edge 0  Control/stop worrying 0  Worry too much - different things 0  Trouble relaxing 0  Restless 0  Easily annoyed or irritable 0  Afraid - awful might happen 0  Total GAD 7 Score 0  Anxiety Difficulty Not difficult at all    Depression screen [facility] 2/9 11/17/2018 03/17/2018 06/28/2017 06/14/2017 04/05/2017  Decreased Interest 0 0 0 0 0  Down, Depressed, Hopeless 1 0 0 0 0  PHQ - 2 Score 1 0 0 0 0  Altered sleeping 3 1 0 - 0  Tired, decreased energy 2 1 0 - 1  Change in appetite 0 0 0 - 0  Feeling bad or failure about yourself  0 0 0 - 0  Trouble concentrating 0 0 0 - 0  Moving slowly or fidgety/restless 0 0 0 - 0  Suicidal thoughts 0 0 0 - 0  PHQ-9 Score 6 2 0 - 1  Difficult doing work/chores Not difficult at all Not difficult at all - - Not difficult at all     Impression and Recommendations:    1. Allergic rhinitis, unspecified seasonality, unspecified trigger   2. Environmental and seasonal allergies   3. Adjustment disorder with depressed mood     - Last OV seen on 11/17/2018.  Adjustment Disorder with Depressed Mood - Started Prozac last OV. - Per pt, mood stabilized since starting Prozac. - Discussed  sx of adjustment reaction / disorder with patient today. - Education provided and all questions answered.  - Discussed goal to optimize mood stability.  Pt feels he may not need the medication - Advised patient that if desired, he may take half a tablet for a couple of weeks and monitor for changes in sx such as feeling lower, less motivation, achy etc etc or otherwise that his mood is less stable. - Patient would like to reduce to half-tablet of Prozac.  See med list. - If patient is feeling fine on half tablet, he may continue on half tablet instead of full dose. - Patient agrees to be conscious of his feelings and "if I start getting morose, I'll go back up."  - Extensively reviewed prudent use of medication with patient today.  All questions were answered.  - In addition to prescription intervention, reviewed the "spokes of the wheel" of mood and health management.  Stressed the importance of ongoing prudent habits, including regular exercise, appropriate sleep hygiene, healthful dietary habits, and prayer/meditation to relax.  - Strongly encouraged increased exercise at home, especially getting out in nature and enjoying the park or his neighborhood. - Advised patient to increase his physical activity gradually as tolerated.  - Will continue to monitor.   Environmental & Seasonal Allergies; Allergic Rhinitis - Refill of Flonase provided today.  See med list. - Advised the patient to continue using AYR or Neilmed sinus rinses PRN, ideally BID, followed by flonase BID (one spray to each nostril).  Advised that the patient may also incorporate allegra or claritin PRN.  - Will continue to monitor.   Health Counseling & Preventative Health Maintenance - Advised patient to continue working toward exercising to improve overall mental, physical, and emotional health.    - Encouraged patient to engage in daily physical activity as tolerated, especially a formal exercise routine.  Recommended  that the patient eventually strive for at least 150 minutes of moderate cardiovascular activity per week according to guidelines established by the [facility].   - Healthy dietary habits encouraged, including low-carb, and high amounts of lean protein in diet.   - Patient should also consume adequate amounts of water.  - Health counseling performed.  All questions answered.  - As part of my medical decision making, I reviewed the following data within the [place] History obtained from pt /family, CMA notes reviewed and incorporated if applicable, Labs reviewed, Radiograph/ tests reviewed if applicable and OV notes from prior OV's with me, as well as other specialists she/he has seen since seeing me last, were all reviewed and used in my medical decision making process today.   - Additionally, discussion had with patient regarding  txmnt plan, and their biases/concerns about that plan were used in my medical decision making today.   - The patient agreed with the plan and demonstrated an understanding of the instructions.   No barriers to understanding were identified.   - Red flag symptoms and signs discussed in detail.  Patient expressed understanding regarding what to do in case of emergency\ urgent symptoms.  The patient was advised to call back or seek an in-person evaluation if the symptoms worsen or if the condition fails to improve as anticipated.   Return for Medicare wellness visit- due for this mid Nov 2020- via telehealth.    Meds ordered this encounter  Medications  . fluticasone (FLONASE) 50 MCG/ACT nasal spray    Sig: USE ONE SPRAY IN EACH NOSTRIL TWICE DAILY AFTER SINUS RINSE    Dispense:  16 g    Refill:  6  . FLUoxetine (PROZAC) 10 MG tablet    Sig: Take 1 tablet (10 mg total) by mouth daily.    Dispense:  90 tablet    Refill:  0    Medications Discontinued During This Encounter  Medication Reason  . albuterol (PROVENTIL HFA;VENTOLIN HFA) 108 (90 Base)  MCG/ACT inhaler No longer needed (for PRN medications)  . fluticasone (FLONASE) 50 MCG/ACT nasal spray Reorder  . FLUoxetine (PROZAC) 10 MG tablet Reorder     I provided 21+ minutes of non face-to-face time during this encounter.  Additional time was spent with charting and coordination of care after the actual visit commenced.   Note:  This note was prepared with assistance of Dragon voice recognition software. Occasional wrong-word or sound-a-like substitutions may have occurred due to the inherent limitations of voice recognition software.  This document serves as a record of services personally performed by Alan Dance, DO. It was created on her behalf by Toni Amend, a trained medical scribe. The creation of this record is based on the scribe's personal observations and the provider's statements to them.   I have reviewed the above medical documentation for accuracy and completeness and I concur.  Alan Dance, DO 02/09/2019 10:29 AM        Patient Care Team    Relationship Specialty Notifications Start End  Alan Dance, DO PCP - General Family Medicine  11/21/15   Christain Sacramento, [place] Referring Physician Optometry  03/02/16    Comment: Blanche East, MD Consulting Physician Dermatology  10/26/16      -Vitals obtained; medications/ allergies reconciled;  personal medical, social, Sx etc.histories were updated by CMA, reviewed by me and are reflected in chart   Patient Active Problem List   Diagnosis Date Noted  . 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, without long-term current use of insulin ([place]) 03/01/2017    Priority: High  . Mixed diabetic hyperlipidemia associated with type 2 diabetes mellitus ([place]) 03/01/2017    Priority: High  . Hypertension associated with diabetes ([place]) 03/01/2017    Priority: High  . Type 2 diabetes mellitus without complication, without long-term current use of insulin ([place])  12/05/2015    Priority: High  . Chronic kidney disease- stage 3a 01/02/2016    Priority: Medium  . Recovering alcoholic in remission ([place]) 12/05/2015    Priority: Medium  . History of tobacco abuse 12/05/2015    Priority: Medium  . GERD (gastroesophageal reflux disease) 12/05/2015    Priority: Low  . Sleep disorder 12/05/2015    Priority: Low  . Vitamin D insufficiency 12/05/2015  Priority: Low  . Vitamin B deficiency 12/05/2015    Priority: Low  . Environmental and seasonal allergies 06/14/2017  . Bite, insect 04/05/2017  . Health education/counseling 03/25/2017  . Allergic rhinitis 03/25/2017  . Cough in adult at night primarily when lying down 03/01/2017  . Vitamin D deficiency 03/01/2017  . Rash and nonspecific skin eruption 09/22/2016  . 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 Fall   . Essential hypertension   . Femur fracture ([place]) 08/04/2016  . 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 aspirin EC 81 MG tablet Take 81 mg by mouth daily.  . Cholecalciferol (VITAMIN D) 2000 units tablet Take 1 tablet by mouth daily.  Marland Kitchen FLUoxetine (PROZAC) 10 MG tablet Take 1 tablet (10 mg total) by mouth daily.  . fluticasone (FLONASE) 50 MCG/ACT nasal spray USE ONE SPRAY IN EACH NOSTRIL TWICE DAILY AFTER SINUS RINSE  . lisinopril (ZESTRIL) 2.5 MG tablet TAKE 1 TABLET BY MOUTH  DAILY  . lovastatin (MEVACOR) 40 MG tablet TAKE 1 TABLET BY MOUTH AT  BEDTIME  . magnesium oxide (MAG-OX) 400 MG tablet Take 400 mg by mouth daily.  . Melatonin 1 MG TABS Take 5 mg by mouth at bedtime.   . metFORMIN (GLUCOPHAGE) 500 MG tablet TAKE 1 TABLET BY MOUTH  DAILY  . Omega-3 Fatty Acids (FISH OIL) 1000 MG CAPS Take 1 capsule by mouth daily.  Marland Kitchen omeprazole (PRILOSEC) 20 MG capsule TAKE 1 CAPSULE BY MOUTH  DAILY  . triamcinolone cream (KENALOG) 0.1 % Apply 1 application topically 2 (two) times daily.  . [DISCONTINUED] FLUoxetine (PROZAC)  10 MG tablet Use one half tablet daily for 1 week then increase to 1 tablet daily  . [DISCONTINUED] fluticasone (FLONASE) 50 MCG/ACT nasal spray USE ONE SPRAY IN EACH NOSTRIL TWICE DAILY AFTER SINUS RINSE   Current Facility-Administered Medications for the 02/07/19 encounter (Office Visit) with Alan Dance, DO  Medication  . 0.9 %  sodium chloride infusion     Allergies:  Allergies  Allergen Reactions  . No Known Allergies      ROS:  See above HPI for pertinent positives and negatives   Objective:   There were no vitals taken for this visit.  (if some vitals are omitted, this means that patient was UNABLE to obtain them even though they were asked to get them prior to OV today.  They were asked to call us at their earliest convenience with these once obtained. )  General: A & O * 3; sounds in no acute distress; in usual state of health.  Skin: Pt confirms warm and dry extremities and pink fingertips HEENT: Pt confirms lips non-cyanotic Chest: Patient confirms normal chest excursion and movement Respiratory: speaking in full sentences, no conversational dyspnea; patient confirms no use of accessory muscles Psych: insight appears good, mood- appears full

## 2019-02-16 ENCOUNTER — Other Ambulatory Visit: Payer: Self-pay

## 2019-02-16 ENCOUNTER — Ambulatory Visit (INDEPENDENT_AMBULATORY_CARE_PROVIDER_SITE_OTHER): Payer: Medicare Other

## 2019-02-16 DIAGNOSIS — Z23 Encounter for immunization: Secondary | ICD-10-CM

## 2019-02-17 IMAGING — CR DG FEMUR 2+V*R*
4 series · 4 of 4 positions shown · non-contrast
Comparison: None.

CLINICAL DATA: Patient slipped and fell. Fracture of the right
femur.

EXAM:
RIGHT FEMUR 2 VIEWS

[femur ap (1 of 2)]
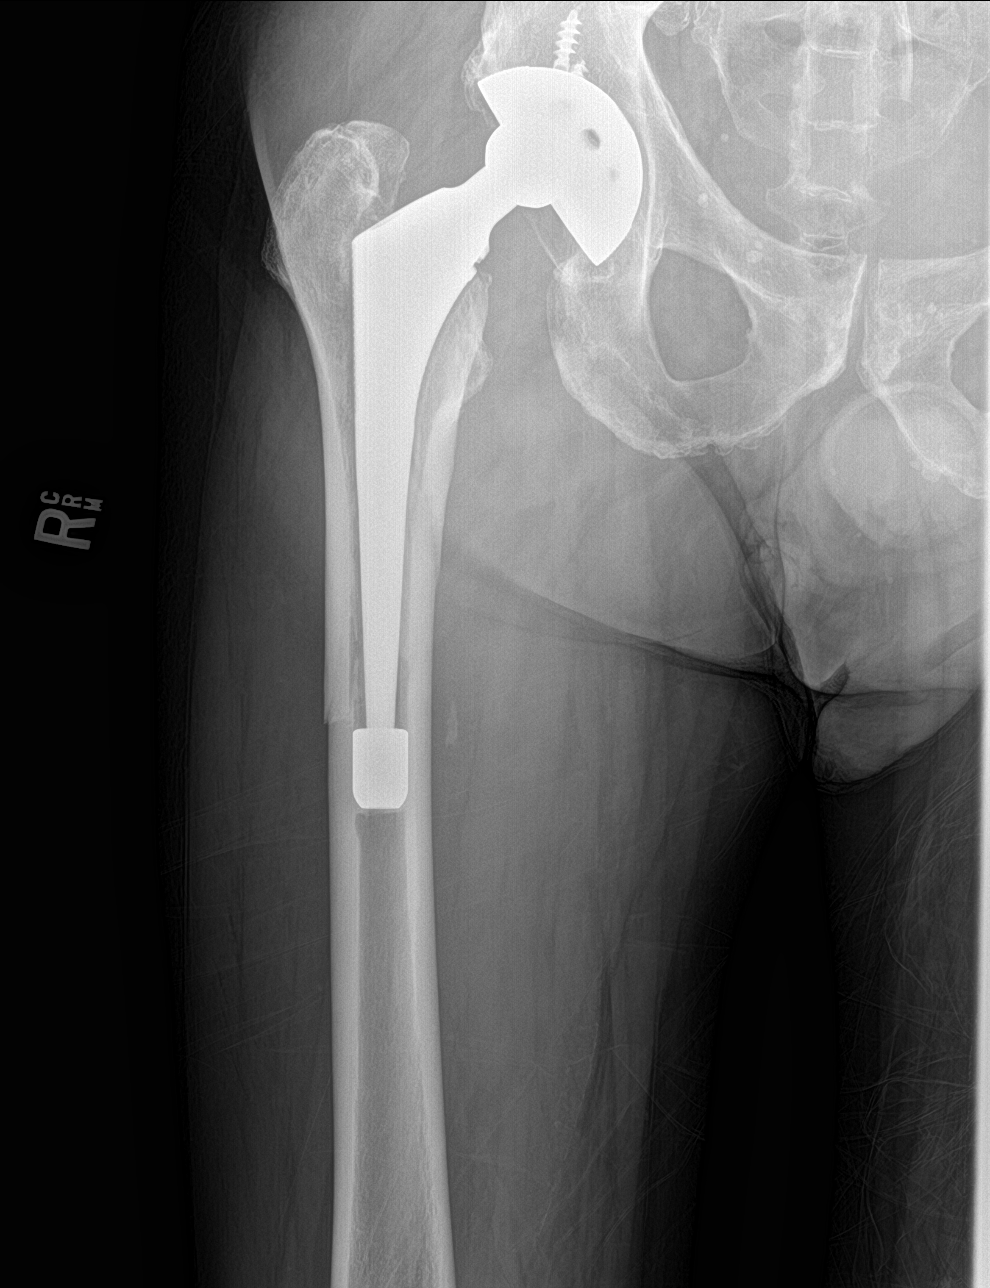

[femur ap (2 of 2)]
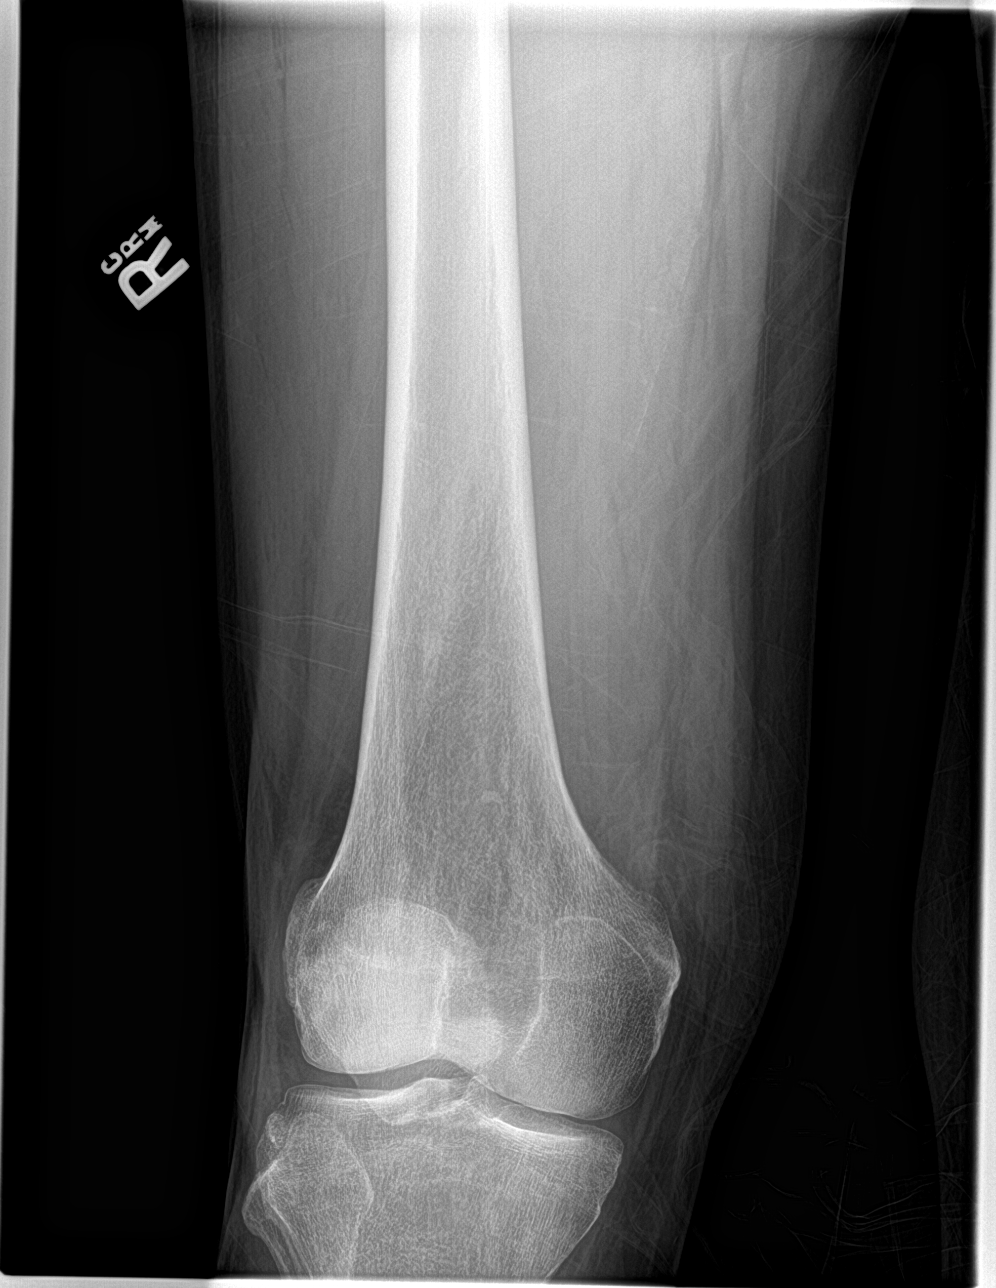

[femur lat (1 of 2)]
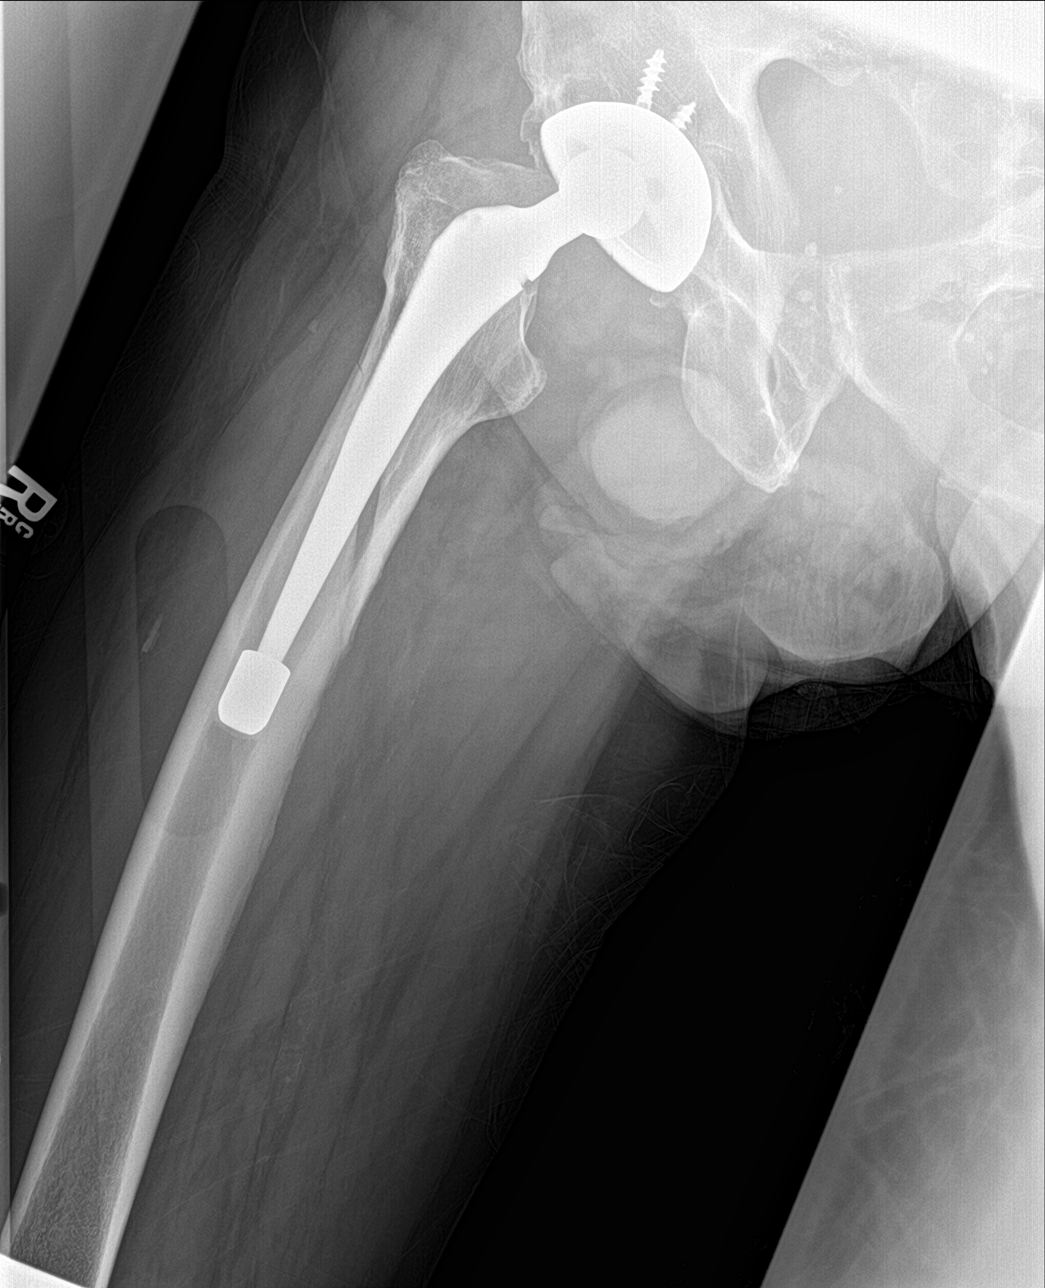

[femur lat (2 of 2)]
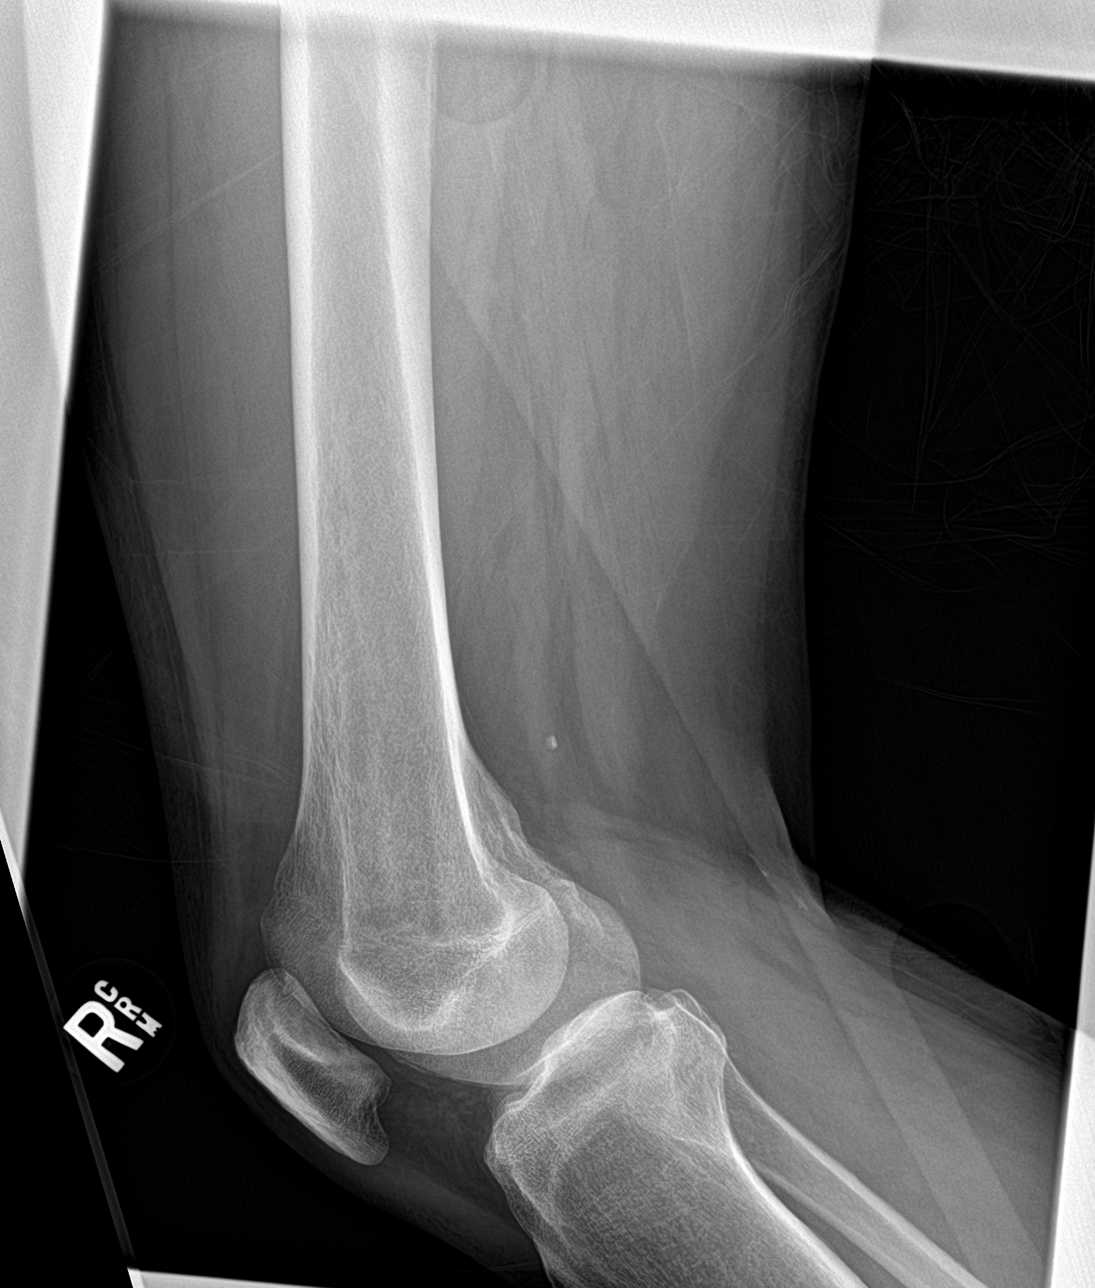

[4 of 4 positions shown; findings below may reference images not displayed]

FINDINGS: Acute, comminuted closed fracture of the proximal femoral shaft
surrounding an uncemented femoral stem component of the total hip
arthroplasty. A horizontal component is seen laterally approximately
2.5 cm from the tip of the femoral stem. An oblique fracture seen
more proximally along the medial aspect. No significant
displacement. Asymmetric positioning of the femoral head prosthetic
within the acetabular component suggests asymmetric lining wear.
IMPRESSION: 1. Acute, closed periprosthetic comminuted fracture of the right
femur as above.
2. Asymmetric lining wear suggested given the non centered
appearance of the femoral head within the acetabular component.

## 2019-02-20 IMAGING — DX DG PORTABLE PELVIS
1 series · 1 of 1 positions shown · non-contrast
Comparison: 08/07/2016

CLINICAL DATA: Post RIGHT TOTAL HIP REVISION For Periprosthetic
Right Femur Fracture. Proximal AP Femur imaged on Pelvis. Surgeon
held for a frogleg lateral and did want/need a distal latera femur
image since the first image had complete hardware.

EXAM:
PORTABLE PELVIS 1-2 VIEWS

[pelvis ap]
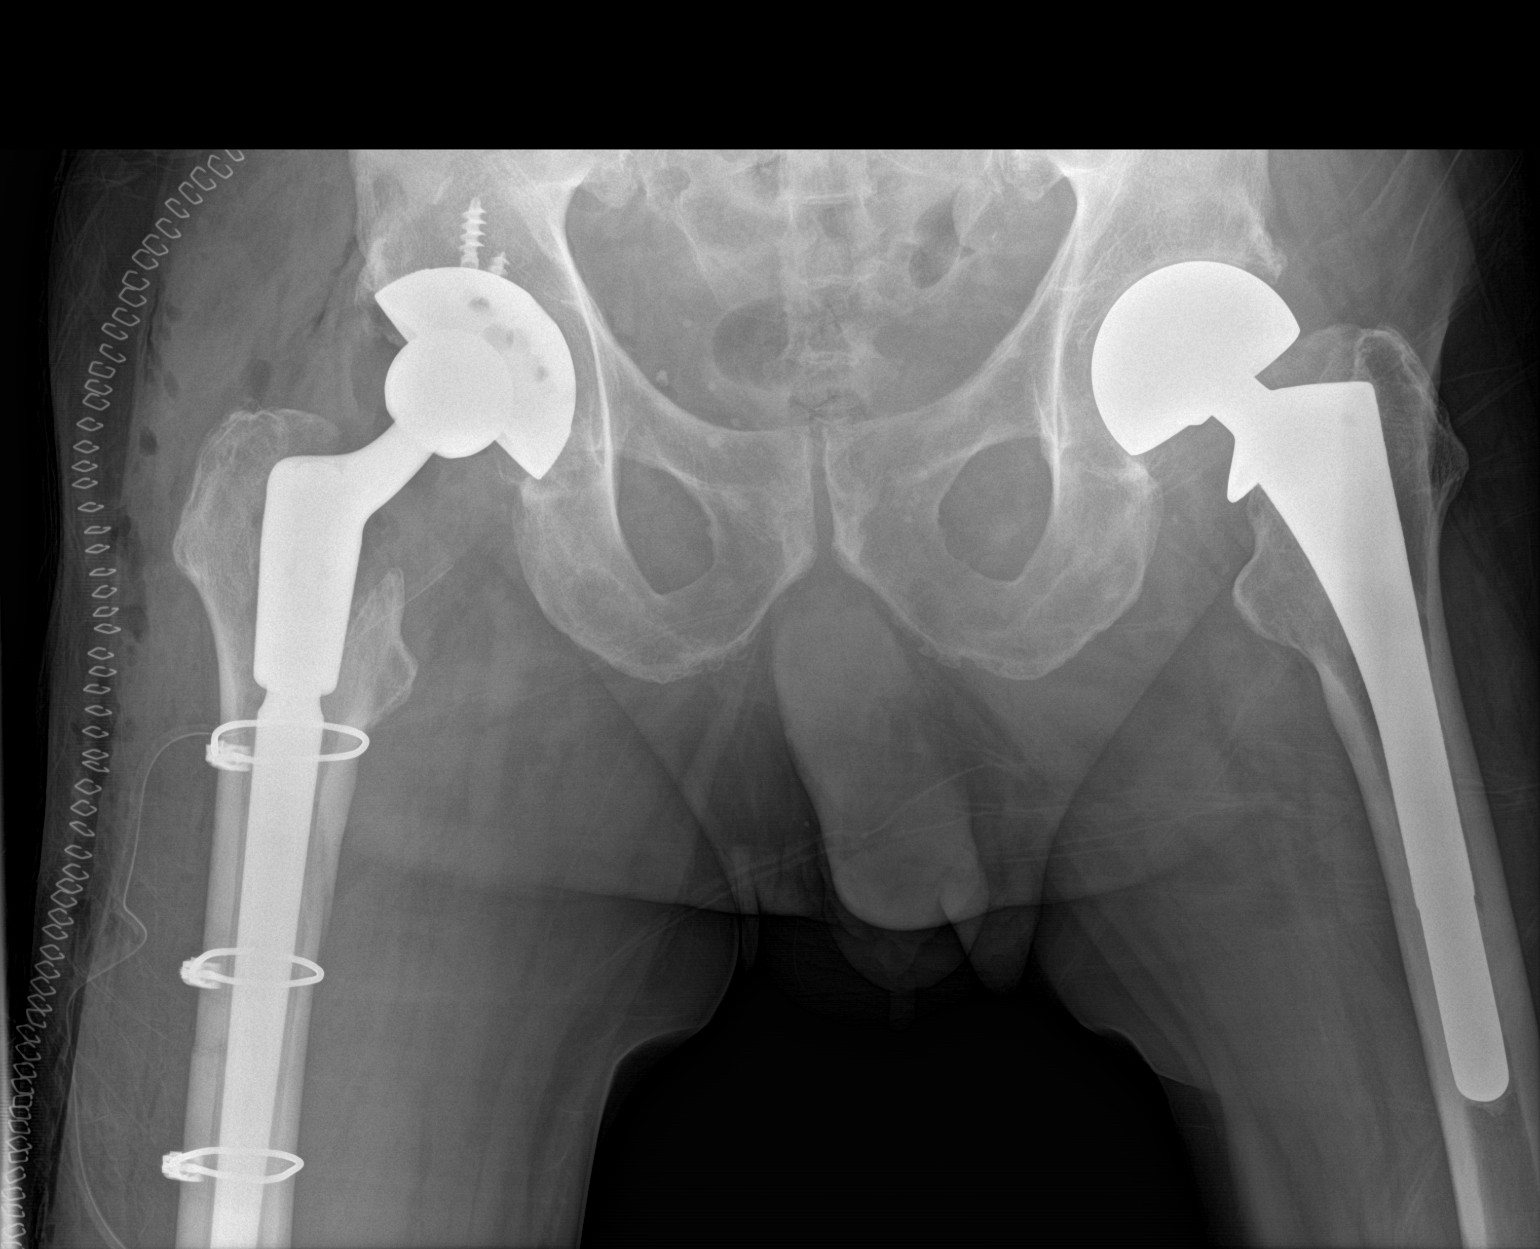

[1 of 1 positions shown; findings below may reference images not displayed]

FINDINGS: Remote left hip arthroplasty. Recent right hip revision. Femoral
head component is well seated in the acetabular component based on
the frontal view. No acute fracture.
IMPRESSION: Status post right hip revision.

## 2019-02-24 ENCOUNTER — Other Ambulatory Visit: Payer: Self-pay | Admitting: Family Medicine

## 2019-02-24 DIAGNOSIS — E782 Mixed hyperlipidemia: Secondary | ICD-10-CM

## 2019-02-24 DIAGNOSIS — K219 Gastro-esophageal reflux disease without esophagitis: Secondary | ICD-10-CM

## 2019-02-24 DIAGNOSIS — N183 Chronic kidney disease, stage 3 unspecified: Secondary | ICD-10-CM

## 2019-02-24 DIAGNOSIS — E119 Type 2 diabetes mellitus without complications: Secondary | ICD-10-CM

## 2019-04-17 ENCOUNTER — Other Ambulatory Visit: Payer: Self-pay | Admitting: Family Medicine

## 2019-04-17 DIAGNOSIS — E782 Mixed hyperlipidemia: Secondary | ICD-10-CM

## 2019-04-17 DIAGNOSIS — K219 Gastro-esophageal reflux disease without esophagitis: Secondary | ICD-10-CM

## 2019-04-17 DIAGNOSIS — E119 Type 2 diabetes mellitus without complications: Secondary | ICD-10-CM

## 2019-04-17 DIAGNOSIS — N183 Chronic kidney disease, stage 3 unspecified: Secondary | ICD-10-CM

## 2019-05-29 ENCOUNTER — Telehealth: Payer: Self-pay | Admitting: Family Medicine

## 2019-05-29 NOTE — Telephone Encounter (Signed)
Patient called Wants all his meds/ Rx sent as a 90dys SUPPLY to :   [place], [place] [employer] [phone] ([phone] (Fax)   ---Forwarding message to med asst regarding Rx refills & also advised pt he may need another "Telehealth" & possibly some lab to get a (90dys) --- Waiting on med asst review of past OV notes .  --glh

## 2019-05-29 NOTE — Telephone Encounter (Signed)
Per Dr. Raliegh Scarlet note from patient last office visit 01/2019 he was to follow up in November for Medicare wellness and has not done so at this time.  Unfortunately I can not sent in a 90 day supply of meds until patient is seen for medicare wellness per our medication refill policy.  Please advise patient of this and schedule him an apt for necessary visit. Thank you. AS, CMA

## 2019-05-31 ENCOUNTER — Encounter: Payer: Self-pay | Admitting: Family Medicine

## 2019-05-31 ENCOUNTER — Encounter: Payer: Self-pay | Admitting: Gastroenterology

## 2019-05-31 ENCOUNTER — Ambulatory Visit (INDEPENDENT_AMBULATORY_CARE_PROVIDER_SITE_OTHER): Payer: Medicare Other | Admitting: Family Medicine

## 2019-05-31 ENCOUNTER — Other Ambulatory Visit: Payer: Self-pay

## 2019-05-31 VITALS — BP 132/78 | HR 85 | Temp 97.3°F | Ht 69.0 in | Wt 199.0 lb

## 2019-05-31 DIAGNOSIS — K219 Gastro-esophageal reflux disease without esophagitis: Secondary | ICD-10-CM | POA: Diagnosis not present

## 2019-05-31 DIAGNOSIS — Z23 Encounter for immunization: Secondary | ICD-10-CM

## 2019-05-31 DIAGNOSIS — Z1211 Encounter for screening for malignant neoplasm of colon: Secondary | ICD-10-CM

## 2019-05-31 DIAGNOSIS — N183 Chronic kidney disease, stage 3 unspecified: Secondary | ICD-10-CM

## 2019-05-31 DIAGNOSIS — Z0000 Encounter for general adult medical examination without abnormal findings: Secondary | ICD-10-CM | POA: Diagnosis not present

## 2019-05-31 DIAGNOSIS — I152 Hypertension secondary to endocrine disorders: Secondary | ICD-10-CM

## 2019-05-31 DIAGNOSIS — E782 Mixed hyperlipidemia: Secondary | ICD-10-CM | POA: Diagnosis not present

## 2019-05-31 DIAGNOSIS — E559 Vitamin D deficiency, unspecified: Secondary | ICD-10-CM

## 2019-05-31 DIAGNOSIS — I10 Essential (primary) hypertension: Secondary | ICD-10-CM

## 2019-05-31 DIAGNOSIS — E1169 Type 2 diabetes mellitus with other specified complication: Secondary | ICD-10-CM

## 2019-05-31 DIAGNOSIS — E539 Vitamin B deficiency, unspecified: Secondary | ICD-10-CM

## 2019-05-31 DIAGNOSIS — E1159 Type 2 diabetes mellitus with other circulatory complications: Secondary | ICD-10-CM

## 2019-05-31 DIAGNOSIS — E119 Type 2 diabetes mellitus without complications: Secondary | ICD-10-CM

## 2019-05-31 MED ORDER — LISINOPRIL 2.5 MG PO TABS: 2.5000 mg | ORAL_TABLET | Freq: Every day | ORAL | 1 refills | Status: DC

## 2019-05-31 MED ORDER — OMEPRAZOLE 20 MG PO CPDR: 20.0000 mg | DELAYED_RELEASE_CAPSULE | Freq: Every day | ORAL | 1 refills | Status: DC

## 2019-05-31 MED ORDER — LOVASTATIN 40 MG PO TABS: 40.0000 mg | ORAL_TABLET | Freq: Every day | ORAL | 1 refills | Status: DC

## 2019-05-31 NOTE — Progress Notes (Signed)
Subjective:   Alan Hurst is a 74 y.o. male who presents for Medicare Annual/Subsequent preventive examination.  HPI: Notes he was going to get hearing aids between 2-3 years ago, went and got evaluated, and was told he was borderline. He had a lot of wax in his ears at the time. Had the wax pulled out, and decided not to get hearing aids yet. Notes "either I've got a wax build up, or my hearing has deteriorated since then, so it's time to do something." Patient states he is "not overly concerned" about his memory. He was going to get some Prevagen, but it was too expensive. 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 Notes his last diabetic eye exam was a couple of years ago. Confirms that it has been over 15 years since he stopped smoking. Denies known family history of osteoporosis in men in his family. Notes he took prednisone years ago and cortisone shots in his hip. Confirms that he took the steroids every day for a month or more.       Objective:    Vitals: BP 132/78   Pulse 85   Temp (!) 97.3 F (36.3 C) (Oral)   Ht 5\' 9"  (1.753 m)   Wt 199 lb (90.3 kg)   BMI 29.39 kg/m   Body mass index is 29.39 kg/m.  Advanced Directives 10/26/2016 08/05/2016 08/04/2016 08/04/2016 12/05/2015 06/28/2015  Does Patient Have a Medical Advance Directive? No No No No Yes No  Type of Advance Directive - - - - Living will;Healthcare Power of Attorney -  Would patient like information on creating a medical advance directive? No - Patient declined No - Patient declined No - Patient declined - - -    Tobacco Social History   Tobacco Use  Smoking Status Former Smoker  . Packs/day: 1.50  . Years: 32.00  . Pack years: 48.00  . Types: Cigarettes  . Start date: 05/11/1961  . Quit date: 05/11/1993  . Years since quitting: 26.0  Smokeless Tobacco Never Used     Counseling given: Not Answered   Past Medical History:  Diagnosis Date  . Diabetes ([place])   . Femur  fracture, right ([place])   . GERD (gastroesophageal reflux disease) 12/05/2015   Well controlled on meds  . H/O: substance abuse ([place]) 1999  . Hip fracture ([place])   . HLD (hyperlipidemia) 12/05/2015   10+ yrs at least.   . 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 Vitamin B deficiency 12/05/2015  . Vitamin D insufficiency 12/05/2015   Past Surgical History:  Procedure Laterality Date  . CATARACT EXTRACTION, BILATERAL  10/2016  . FEMUR FRACTURE SURGERY    . HIP FRACTURE SURGERY    . JOINT REPLACEMENT Bilateral    hips  . TIBIA FRACTURE SURGERY    . TOTAL HIP REVISION Right 08/07/2016   Procedure: RIGHT TOTAL HIP REVISION;  Surgeon: Rod Can, MD;  Location: [facility];  Service: Orthopedics;  Laterality: Right;   Family History  Problem Relation Age of Onset  . Alcohol abuse Mother   . Alcohol abuse Father   . Congestive Heart Failure Father   . Kidney disease Father   . Colon cancer Neg Hx   . Esophageal cancer Neg Hx   . Rectal cancer Neg Hx   . Stomach cancer Neg Hx    Social History   Socioeconomic History  . Marital status: Married  Spouse name: Not on file  . Number of children: Not on file  . Years of education: Not on file  . Highest education level: Not on file  Occupational History  . Not on file  Tobacco Use  . Smoking status: Former Smoker    Packs/day: 1.50    Years: 32.00    Pack years: 48.00    Types: Cigarettes    Start date: 05/11/1961    Quit date: 05/11/1993    Years since quitting: 26.0  . Smokeless tobacco: Never Used  Substance and Sexual Activity  . Alcohol use: No  . Drug use: No  . Sexual activity: Yes    Birth control/protection: None  Other Topics Concern  . Not on file  Social History Narrative  . Not on file   Social Determinants of Health   Financial Resource Strain:   . Difficulty of Paying Living Expenses: Not on file  Food Insecurity:   . Worried About Charity fundraiser in the Last Year:  Not on file  . Ran Out of Food in the Last Year: Not on file  Transportation Needs:   . Lack of Transportation (Medical): Not on file  . Lack of Transportation (Non-Medical): Not on file  Physical Activity:   . Days of Exercise per Week: Not on file  . Minutes of Exercise per Session: Not on file  Stress:   . Feeling of Stress : Not on file  Social Connections:   . Frequency of Communication with Friends and Family: Not on file  . Frequency of Social Gatherings with Friends and Family: Not on file  . Attends Religious Services: Not on file  . Active Member of Clubs or Organizations: Not on file  . Attends Archivist Meetings: Not on file  . Marital Status: Not on file    Outpatient Encounter Medications as of 05/31/2019  Medication Sig  . aspirin EC 81 MG tablet Take 81 mg by mouth daily.  . Cholecalciferol (VITAMIN D) 2000 units tablet Take 1 tablet by mouth daily.  . fluticasone (FLONASE) 50 MCG/ACT nasal spray USE ONE SPRAY IN EACH NOSTRIL TWICE DAILY AFTER SINUS RINSE  . lisinopril (ZESTRIL) 2.5 MG tablet Take 1 tablet (2.5 mg total) by mouth daily.  Marland Kitchen lovastatin (MEVACOR) 40 MG tablet Take 1 tablet (40 mg total) by mouth at bedtime.  . magnesium oxide (MAG-OX) 400 MG tablet Take 400 mg by mouth daily.  . Melatonin 1 MG TABS Take 5 mg by mouth at bedtime.   . metFORMIN (GLUCOPHAGE) 500 MG tablet TAKE 1 TABLET BY MOUTH  DAILY  . Omega-3 Fatty Acids (FISH OIL) 1000 MG CAPS Take 1 capsule by mouth daily.  Marland Kitchen omeprazole (PRILOSEC) 20 MG capsule Take 1 capsule (20 mg total) by mouth daily.  . [DISCONTINUED] lisinopril (ZESTRIL) 2.5 MG tablet Take 1 tablet (2.5 mg total) by mouth daily. **PATIENT NEEDS APT FOR FURTHER REFILLS**  . [DISCONTINUED] lovastatin (MEVACOR) 40 MG tablet Take 1 tablet (40 mg total) by mouth at bedtime. **PATIENT NEEDS APT FOR FURTHER REFILLS**  . [DISCONTINUED] omeprazole (PRILOSEC) 20 MG capsule Take 1 capsule (20 mg total) by mouth daily. **PATIENT  NEEDS APT FOR FURTHER REFILLS**  . FLUoxetine (PROZAC) 10 MG tablet Take 1 tablet (10 mg total) by mouth daily. (Patient not taking: Reported on 05/31/2019)  . triamcinolone cream (KENALOG) 0.1 % Apply 1 application topically 2 (two) times daily. (Patient not taking: Reported on 05/31/2019)   Facility-Administered Encounter Medications as of  05/31/2019  Medication  . 0.9 %  sodium chloride infusion    Activities of Daily Living In your present state of health, do you have any difficulty performing the following activities: 05/31/2019 07/18/2018  Hearing? Y N  Vision? N N  Difficulty concentrating or making decisions? N N  Walking or climbing stairs? N N  Dressing or bathing? N N  Doing errands, shopping? N N  Some recent data might be hidden    Patient Care Team: [name], DO as PCP - General (Family Medicine) Christain [place], [place] as Referring Physician (Optometry) Lavonna Monarch, MD as Consulting Physician (Dermatology)   Assessment:   This is a routine wellness examination for Zayed.  Exercise Activities and Dietary recommendations    Goals   None     Fall Risk Fall Risk  05/31/2019 07/18/2018 03/17/2018 06/14/2017 03/01/2017  Falls in the past year? 0 0 0 No No  Number falls in past yr: 0 - - - -  Injury with Fall? 0 - - - -  Risk Factor Category  - - - - -  Risk for fall due to : - - - - -  Follow up Falls evaluation completed Falls evaluation completed - - -  Comment - - - - -   Is the patient's home free of loose throw rugs in walkways, pet beds, electrical cords, etc?   yes      Grab bars in the bathroom? yes      Handrails on the stairs?   yes      Adequate lighting?   yes  Timed Get Up and Go Performed: Telehealth  Depression Screen PHQ 2/9 Scores 05/31/2019 11/17/2018 03/17/2018 06/28/2017  PHQ - 2 Score 0 1 0 0  PHQ- 9 Score 1 6 2  0    Cognitive Function 6CIT Screen 05/31/2019 03/17/2018  What Year? 0 points 0 points  What month? 0 points 0 points  What  time? 0 points 0 points  Count back from 20 0 points 0 points  Months in reverse 0 points 0 points  Repeat phrase 0 points 0 points  Total Score 0 0    Immunization History  Administered Date(s) Administered  . Fluad Quad(high Dose 65+) 02/16/2019  . Influenza Split 05/20/2012  . Influenza, High Dose Seasonal PF 03/31/2013, 05/10/2015, 03/02/2016, 03/01/2017, 02/15/2018  . Pneumococcal Conjugate-13 05/10/2015, 03/02/2016  . Pneumococcal Polysaccharide-23 03/31/2013  . Tdap 03/25/2017    Qualifies for Shingles Vaccine? Ordered for pharmacy Influenza: UTD Tdap: UTD Pneumococcal: UTD  Screening Tests Health Maintenance  Topic Date Due  . OPHTHALMOLOGY EXAM  03/10/2018  . HEMOGLOBIN A1C  01/20/2019  . COLONOSCOPY  07/18/2019 (Originally 05/27/2018)  . Hepatitis C Screening  10/26/2028 (Originally 11/13/1945)  . FOOT EXAM  07/18/2019  . TETANUS/TDAP  03/26/2027  . INFLUENZA VACCINE  Completed  . PNA vac Low Risk Adult  Completed   Cancer Screenings: Lung: Low Dose CT Chest recommended if Age 66-80 years, 30 pack-year currently smoking OR have quit w/in 15years. Patient does qualify. Colorectal: Last colonoscopy 05/27/17-needs repeat  Additional Screenings:  Hepatitis C Screening: Patient declined HIV Screening: Patient declined      Plan:   PLAN: - Health education and counseling provided. Advised patient regarding need for immunizations and screenings according to guidelines. All questions answered. - Reviewed symptoms of normal memory loss with patient today. To help improve his memory, encouraged patient to engage in exercise 30-45 minutes per day, to help increase blood flow to the brain.  Also encouraged patient to consume a high-antioxidant diet rich in fruits and vegetables, low in pre-packaged or other inflammatory foods. - Patient knows to seek hearing consultation near future if desired. Advised patient of options for obtaining a hearing assessment. - Last AAA ultrasound  done 11 of 2018 with repeat advised in 5 years, due 11 of 2023. - Last colonoscopy obtained 05/27/2017; repeat recommended in 1-2 years. Told patient to call Dr. Havery Moros for return. - Need for shingles vaccination. Patient agrees to obtain through insurance as recommended. - Pneumonia vaccination UTD. Influenza vaccination UTD. - Told patient to obtain diabetic eye exam and diabetic foot exam once yearly. Patient knows to call to schedule diabetic eye exam as soon as possible. - Discussed indications for DEXA sccan in higher-risk patients today. - Counseling provided to patient today regarding recommendations for COVID-19 vaccination.   I have personally reviewed and noted the following in the patient's chart:   . Medical and social history . Use of alcohol, tobacco or illicit drugs  . Current medications and supplements . Functional ability and status . Nutritional status . Physical activity . Advanced directives . List of other physicians . Hospitalizations, surgeries, and ER visits in previous 12 months . Vitals . Screenings to include cognitive, depression, and falls . Referrals and appointments  In addition, I have reviewed and discussed with patient certain preventive protocols, quality metrics, and best practice recommendations. A written personalized care plan for preventive services as well as general preventive health recommendations were provided to patient.     [name], DO

## 2019-06-06 ENCOUNTER — Other Ambulatory Visit: Payer: Self-pay

## 2019-06-06 ENCOUNTER — Ambulatory Visit (AMBULATORY_SURGERY_CENTER): Payer: Self-pay | Admitting: *Deleted

## 2019-06-06 VITALS — Temp 98.0°F | Ht 69.0 in | Wt 202.0 lb

## 2019-06-06 DIAGNOSIS — Z86010 Personal history of colonic polyps: Secondary | ICD-10-CM

## 2019-06-06 DIAGNOSIS — Z01818 Encounter for other preprocedural examination: Secondary | ICD-10-CM

## 2019-06-06 MED ORDER — SUPREP BOWEL PREP KIT 17.5-3.13-1.6 GM/177ML PO SOLN: ORAL | 0 refills | Status: DC

## 2019-06-06 NOTE — Progress Notes (Signed)
Pt is aware that care partner will wait in the car during procedure; if they feel like they will be too hot or cold to wait in the car; they may wait in the 4 th floor lobby. Patient is aware to bring only one care partner. We want them to wear a mask (we do not have any that we can provide them), practice social distancing, and we will check their temperatures when they get here.  I did remind the patient that their care partner needs to stay in the parking lot the entire time and have a cell phone available, we will call them when the pt is ready for discharge. Patient will wear mask into building.  No egg or soy allergy  No home oxygen use or problems with anesthesia  No medications for weight loss taken  covid test 06-15-19 at 1:30 pm

## 2019-06-15 ENCOUNTER — Ambulatory Visit (INDEPENDENT_AMBULATORY_CARE_PROVIDER_SITE_OTHER): Payer: Medicare Other

## 2019-06-15 ENCOUNTER — Other Ambulatory Visit: Payer: Self-pay

## 2019-06-15 ENCOUNTER — Other Ambulatory Visit: Payer: Self-pay | Admitting: Gastroenterology

## 2019-06-15 DIAGNOSIS — Z1159 Encounter for screening for other viral diseases: Secondary | ICD-10-CM

## 2019-06-16 ENCOUNTER — Encounter: Payer: Self-pay | Admitting: Gastroenterology

## 2019-06-16 LAB — SARS CORONAVIRUS 2 (TAT 6-24 HRS): SARS Coronavirus 2: NEGATIVE

## 2019-06-20 ENCOUNTER — Other Ambulatory Visit: Payer: Self-pay

## 2019-06-20 ENCOUNTER — Ambulatory Visit (AMBULATORY_SURGERY_CENTER): Payer: Medicare Other | Admitting: Gastroenterology

## 2019-06-20 ENCOUNTER — Encounter: Payer: Self-pay | Admitting: Gastroenterology

## 2019-06-20 VITALS — BP 106/62 | HR 70 | Temp 97.8°F | Resp 15 | Ht 69.0 in | Wt 202.0 lb

## 2019-06-20 DIAGNOSIS — D123 Benign neoplasm of transverse colon: Secondary | ICD-10-CM

## 2019-06-20 DIAGNOSIS — D120 Benign neoplasm of cecum: Secondary | ICD-10-CM

## 2019-06-20 DIAGNOSIS — Z86010 Personal history of colonic polyps: Secondary | ICD-10-CM

## 2019-06-20 DIAGNOSIS — D122 Benign neoplasm of ascending colon: Secondary | ICD-10-CM | POA: Diagnosis not present

## 2019-06-20 DIAGNOSIS — D125 Benign neoplasm of sigmoid colon: Secondary | ICD-10-CM

## 2019-06-20 MED ORDER — SODIUM CHLORIDE 0.9 % IV SOLN: 500.0000 mL | Freq: Once | INTRAVENOUS | Status: DC

## 2019-06-20 NOTE — Patient Instructions (Signed)
YOU HAD AN ENDOSCOPIC PROCEDURE TODAY AT THE [facility]:   Refer to the procedure report that was given to you for any specific questions about what was found during the examination.  If the procedure report does not answer your questions, please call your gastroenterologist to clarify.  If you requested that your care partner not be given the details of your procedure findings, then the procedure report has been included in a sealed envelope for you to review at your convenience later.  YOU SHOULD EXPECT: Some feelings of bloating in the abdomen. Passage of more gas than usual.  Walking can help get rid of the air that was put into your GI tract during the procedure and reduce the bloating. If you had a lower endoscopy (such as a colonoscopy or flexible sigmoidoscopy) you may notice spotting of blood in your stool or on the toilet paper. If you underwent a bowel prep for your procedure, you may not have a normal bowel movement for a few days.  Please Note:  You might notice some irritation and congestion in your nose or some drainage.  This is from the oxygen used during your procedure.  There is no need for concern and it should clear up in a day or so.  SYMPTOMS TO REPORT IMMEDIATELY:   Following lower endoscopy (colonoscopy or flexible sigmoidoscopy):  Excessive amounts of blood in the stool  Significant tenderness or worsening of abdominal pains  Swelling of the abdomen that is new, acute  Fever of 100F or higher  For urgent or emergent issues, a gastroenterologist can be reached at any hour by calling [phone].   DIET:  We do recommend a small meal at first, but then you may proceed to your regular diet.  Drink plenty of fluids but you should avoid alcoholic beverages for 24 hours.  ACTIVITY:  You should plan to take it easy for the rest of today and you should NOT DRIVE or use heavy machinery until tomorrow (because of the sedation medicines used during the test).     FOLLOW UP: Our staff will call the number listed on your records 48-72 hours following your procedure to check on you and address any questions or concerns that you may have regarding the information given to you following your procedure. If we do not reach you, we will leave a message.  We will attempt to reach you two times.  During this call, we will ask if you have developed any symptoms of COVID 19. If you develop any symptoms (ie: fever, flu-like symptoms, shortness of breath, cough etc.) before then, please call [phone].  If you test positive for Covid 19 in the 2 weeks post procedure, please call and report this information to us.    If any biopsies were taken you will be contacted by phone or by letter within the next 1-3 weeks.  Please call us at [phone] if you have not heard about the biopsies in 3 weeks.    SIGNATURES/CONFIDENTIALITY: You and/or your care partner have signed paperwork which will be entered into your electronic medical record.  These signatures attest to the fact that that the information above on your After Visit Summary has been reviewed and is understood.  Full responsibility of the confidentiality of this discharge information lies with you and/or your care-partner. 

## 2019-06-20 NOTE — Progress Notes (Signed)
Report to PACU, RN, vss, BBS= Clear.  

## 2019-06-20 NOTE — Progress Notes (Signed)
Called to room to assist during endoscopic procedure.  Patient ID and intended procedure confirmed with present staff. Received instructions for my participation in the procedure from the performing physician.  

## 2019-06-20 NOTE — Progress Notes (Signed)
Pt's states no medical or surgical changes since previsit or office visit. 

## 2019-06-20 NOTE — Op Note (Signed)
[name] Procedure Date: 06/20/2019 11:11 AM MRN: [id number] Endoscopist: Remo Lipps P. Havery Moros , MD Age: 74 Referring MD:  Date of Birth: 12/01/1945 Gender: Male Account #: [id number] Procedure:                Colonoscopy Indications:              High risk colon cancer surveillance: Personal                            history of colonic polyps (3 adenomas removed in                            2019 - that exam was also limited by poor prep) Medicines:                Monitored Anesthesia Care Procedure:                Pre-Anesthesia Assessment:                           - Prior to the procedure, a History and Physical                            was performed, and patient medications and                            allergies were reviewed. The patient's tolerance of                            previous anesthesia was also reviewed. The risks                            and benefits of the procedure and the sedation                            options and risks were discussed with the patient.                            All questions were answered, and informed consent                            was obtained. Prior Anticoagulants: The patient has                            taken no previous anticoagulant or antiplatelet                            agents. ASA Grade Assessment: II - A patient with                            mild systemic disease. After reviewing the risks                            and benefits, the patient was deemed in  satisfactory condition to undergo the procedure.                           After obtaining informed consent, the colonoscope                            was passed under direct vision. Throughout the                            procedure, the patient's blood pressure, pulse, and                            oxygen saturations were monitored continuously. The                            Colonoscope  was introduced through the anus and                            advanced to the the cecum, identified by                            appendiceal orifice and ileocecal valve. The                            colonoscopy was performed without difficulty. The                            patient tolerated the procedure well. The quality                            of the bowel preparation was adequate. The                            ileocecal valve, appendiceal orifice, and rectum                            were photographed. Scope In: 11:12:49 AM Scope Out: 11:35:47 AM Scope Withdrawal Time: 0 hours 17 minutes 3 seconds  Total Procedure Duration: 0 hours 22 minutes 58 seconds  Findings:                 Skin tags were found on perianal exam.                           A 3 mm polyp was found in the cecum. The polyp was                            sessile. The polyp was removed with a cold snare.                            Resection and retrieval were complete.                           Three sessile polyps were found in the ascending  colon. The polyps were diminutive in size. These                            polyps were removed with a cold snare. Resection                            and retrieval were complete.                           A 6 mm polyp was found in the hepatic flexure. The                            polyp was sessile. The polyp was removed with a                            cold snare. Resection and retrieval were complete.                           Three sessile polyps were found in the transverse                            colon. The polyps were 4 to 7 mm in size. These                            polyps were removed with a cold snare. Resection                            and retrieval were complete.                           A 4 mm polyp was found in the sigmoid colon. The                            polyp was sessile. The polyp was removed with a                             cold snare. Resection and retrieval were complete.                           Internal hemorrhoids were found during                            retroflexion. The hemorrhoids were moderate.                           The exam was otherwise without abnormality. Some                            residual stool was noted in the colon, time was                            taken to lavage it to achieve adequate views. Complications:  No immediate complications. Estimated blood loss:                            Minimal. Estimated Blood Loss:     Estimated blood loss was minimal. Impression:               - Perianal skin tags found on perianal exam.                           - One 3 mm polyp in the cecum, removed with a cold                            snare. Resected and retrieved.                           - Three diminutive polyps in the ascending colon,                            removed with a cold snare. Resected and retrieved.                           - One 6 mm polyp at the hepatic flexure, removed                            with a cold snare. Resected and retrieved.                           - Three 4 to 7 mm polyps in the transverse colon,                            removed with a cold snare. Resected and retrieved.                           - One 4 mm polyp in the sigmoid colon, removed with                            a cold snare. Resected and retrieved.                           - Internal hemorrhoids.                           - The examination was otherwise normal. Recommendation:           - Patient has a contact number available for                            emergencies. The signs and symptoms of potential                            delayed complications were discussed with the                            patient. Return to normal activities tomorrow.  Written discharge instructions were provided to the                             patient.                           - Resume previous diet.                           - Continue present medications.                           - Await pathology results. Remo Lipps P. Ka Bench, MD 06/20/2019 11:42:10 AM This report has been signed electronically.

## 2019-06-22 ENCOUNTER — Telehealth: Payer: Self-pay | Admitting: *Deleted

## 2019-06-22 ENCOUNTER — Encounter: Payer: Self-pay | Admitting: Gastroenterology

## 2019-06-22 NOTE — Telephone Encounter (Signed)
  Follow up Call-  Call back number 06/20/2019 05/27/2017  Post procedure Call Back phone  # [phone]  Permission to leave phone message Yes Yes  Some recent data might be hidden     Patient questions:  Do you have a fever, pain , or abdominal swelling? No. Pain Score  0 *  Have you tolerated food without any problems? Yes.    Have you been able to return to your normal activities? Yes.    Do you have any questions about your discharge instructions: Diet   No. Medications  No. Follow up visit  No.  Do you have questions or concerns about your Care? No.  Actions: * If pain score is 4 or above: No action needed, pain <4.   1. Have you developed a fever since your procedure? no  2.   Have you had an respiratory symptoms (SOB or cough) since your procedure? no  3.   Have you tested positive for COVID 19 since your procedure no  4.   Have you had any family members/close contacts diagnosed with the COVID 19 since your procedure?  no   If yes to any of these questions please route to Joylene John, RN and Alphonsa Gin, Therapist, sports.

## 2019-06-22 NOTE — Telephone Encounter (Signed)
  Follow up Call-  Call back number 06/20/2019 05/27/2017  Post procedure Call Back phone  # [phone]  Permission to leave phone message Yes Yes  Some recent data might be hidden     Patient questions:  Do you have a fever, pain , or abdominal swelling? No. Pain Score  0 *  Have you tolerated food without any problems? Yes.    Have you been able to return to your normal activities? Yes.    Do you have any questions about your discharge instructions: Diet   No. Medications  No. Follow up visit  No.  Do you have questions or concerns about your Care? No.  Actions: * If pain score is 4 or above: No action needed, pain <4.  1. Have you developed a fever since your procedure? no  2.   Have you had an respiratory symptoms (SOB or cough) since your procedure? no  3.   Have you tested positive for COVID 19 since your procedure no  4.   Have you had any family members/close contacts diagnosed with the COVID 19 since your procedure?  no   If yes to any of these questions please route to Joylene John, RN and Alphonsa Gin, Therapist, sports.

## 2019-07-25 ENCOUNTER — Other Ambulatory Visit: Payer: Self-pay | Admitting: Family Medicine

## 2019-07-25 DIAGNOSIS — E119 Type 2 diabetes mellitus without complications: Secondary | ICD-10-CM

## 2019-10-19 ENCOUNTER — Other Ambulatory Visit: Payer: Self-pay | Admitting: Family Medicine

## 2019-10-19 DIAGNOSIS — N183 Chronic kidney disease, stage 3 unspecified: Secondary | ICD-10-CM

## 2019-10-19 DIAGNOSIS — K219 Gastro-esophageal reflux disease without esophagitis: Secondary | ICD-10-CM

## 2019-10-19 DIAGNOSIS — E782 Mixed hyperlipidemia: Secondary | ICD-10-CM

## 2019-10-19 DIAGNOSIS — E119 Type 2 diabetes mellitus without complications: Secondary | ICD-10-CM

## 2019-11-14 ENCOUNTER — Telehealth: Payer: Self-pay | Admitting: Physician Assistant

## 2019-11-14 DIAGNOSIS — N183 Chronic kidney disease, stage 3 unspecified: Secondary | ICD-10-CM

## 2019-11-14 DIAGNOSIS — E119 Type 2 diabetes mellitus without complications: Secondary | ICD-10-CM

## 2019-11-14 DIAGNOSIS — E782 Mixed hyperlipidemia: Secondary | ICD-10-CM

## 2019-11-14 DIAGNOSIS — K219 Gastro-esophageal reflux disease without esophagitis: Secondary | ICD-10-CM

## 2019-11-14 MED ORDER — OMEPRAZOLE 20 MG PO CPDR: 20.0000 mg | DELAYED_RELEASE_CAPSULE | Freq: Every day | ORAL | 0 refills | Status: DC

## 2019-11-14 MED ORDER — LISINOPRIL 2.5 MG PO TABS: 2.5000 mg | ORAL_TABLET | Freq: Every day | ORAL | 0 refills | Status: DC

## 2019-11-14 MED ORDER — LOVASTATIN 40 MG PO TABS: 40.0000 mg | ORAL_TABLET | Freq: Every day | ORAL | 0 refills | Status: DC

## 2019-11-14 MED ORDER — METFORMIN HCL 500 MG PO TABS: 500.0000 mg | ORAL_TABLET | Freq: Every day | ORAL | 0 refills | Status: DC

## 2019-11-14 NOTE — Addendum Note (Signed)
Addended by: Fonnie Mu on: 11/14/2019 04:02 PM   Modules accepted: Orders

## 2019-11-14 NOTE — Telephone Encounter (Signed)
Please call pt to schedule appt.  He was supposed to have returned in March or April for follow up and has not been seen.  Sixty day supply of meds requested sent to pharmacy.  No further refills until pt is seen.  Charyl Bigger, CMA

## 2019-11-14 NOTE — Telephone Encounter (Signed)
Patient is requesting a refill of his lovastatin, lisinopril, omeprazole, and metformin. If approved please send to [facility]

## 2019-12-25 ENCOUNTER — Other Ambulatory Visit: Payer: Self-pay | Admitting: Physician Assistant

## 2019-12-25 DIAGNOSIS — E782 Mixed hyperlipidemia: Secondary | ICD-10-CM

## 2019-12-25 DIAGNOSIS — N183 Chronic kidney disease, stage 3 unspecified: Secondary | ICD-10-CM

## 2019-12-25 DIAGNOSIS — K219 Gastro-esophageal reflux disease without esophagitis: Secondary | ICD-10-CM

## 2019-12-25 DIAGNOSIS — E119 Type 2 diabetes mellitus without complications: Secondary | ICD-10-CM

## 2020-01-10 ENCOUNTER — Other Ambulatory Visit: Payer: Self-pay | Admitting: Physician Assistant

## 2020-01-10 DIAGNOSIS — K219 Gastro-esophageal reflux disease without esophagitis: Secondary | ICD-10-CM

## 2020-01-10 DIAGNOSIS — N183 Chronic kidney disease, stage 3 unspecified: Secondary | ICD-10-CM

## 2020-01-10 DIAGNOSIS — E782 Mixed hyperlipidemia: Secondary | ICD-10-CM

## 2020-01-10 DIAGNOSIS — E119 Type 2 diabetes mellitus without complications: Secondary | ICD-10-CM

## 2020-01-10 MED ORDER — LISINOPRIL 2.5 MG PO TABS: 2.5000 mg | ORAL_TABLET | Freq: Every day | ORAL | 0 refills | Status: DC

## 2020-01-10 MED ORDER — OMEPRAZOLE 20 MG PO CPDR: 20.0000 mg | DELAYED_RELEASE_CAPSULE | Freq: Every day | ORAL | 0 refills | Status: DC

## 2020-01-10 MED ORDER — METFORMIN HCL 500 MG PO TABS: 500.0000 mg | ORAL_TABLET | Freq: Every day | ORAL | 0 refills | Status: DC

## 2020-01-10 MED ORDER — LOVASTATIN 40 MG PO TABS: 40.0000 mg | ORAL_TABLET | Freq: Every day | ORAL | 0 refills | Status: DC

## 2020-01-10 NOTE — Telephone Encounter (Signed)
Please call patient to advise to schedule apt for further refill. AS< CMA

## 2020-01-22 ENCOUNTER — Other Ambulatory Visit: Payer: Self-pay

## 2020-01-22 ENCOUNTER — Ambulatory Visit (INDEPENDENT_AMBULATORY_CARE_PROVIDER_SITE_OTHER): Payer: Medicare Other | Admitting: Physician Assistant

## 2020-01-22 ENCOUNTER — Encounter: Payer: Self-pay | Admitting: Physician Assistant

## 2020-01-22 VITALS — Ht 69.5 in | Wt 198.0 lb

## 2020-01-22 DIAGNOSIS — R0600 Dyspnea, unspecified: Secondary | ICD-10-CM

## 2020-01-22 DIAGNOSIS — J309 Allergic rhinitis, unspecified: Secondary | ICD-10-CM | POA: Diagnosis not present

## 2020-01-22 DIAGNOSIS — J3089 Other allergic rhinitis: Secondary | ICD-10-CM

## 2020-01-22 DIAGNOSIS — R0609 Other forms of dyspnea: Secondary | ICD-10-CM

## 2020-01-22 DIAGNOSIS — R351 Nocturia: Secondary | ICD-10-CM | POA: Diagnosis not present

## 2020-01-22 DIAGNOSIS — E119 Type 2 diabetes mellitus without complications: Secondary | ICD-10-CM

## 2020-01-22 MED ORDER — FLUTICASONE PROPIONATE 50 MCG/ACT NA SUSP: NASAL | 2 refills | Status: DC

## 2020-01-22 NOTE — Progress Notes (Signed)
Telehealth office visit note for Alan Reid, PA-C- at [facility] at [facility]   I connected with current patient today by telephone and verified that I am speaking with the correct person   . Location of the patient: Home . Location of the provider: Office - This visit type was conducted due to national recommendations for restrictions regarding the COVID-19 Pandemic (e.g. social distancing) in an effort to limit this patient's exposure and mitigate transmission in our community.    - No physical exam could be performed with this format, beyond that communicated to [place] by the patient/ family members as noted.   - Additionally my office staff/ schedulers were to discuss with the patient that there may be a monetary charge related to this service, depending on their medical insurance.  My understanding is that patient understood and consented to proceed.     _________________________________________________________________________________   History of Present Illness: Pt calls in to follow up on environmental and seasonal allergies. He also has other medical concerns he wants to discuss.  Environmental and seasonal allergies: Requesting refill of Flonase which helps with his nasal congestion during allergy season.  Reports he does nasal rinses with saline before using nasal spray.  Nocturia: Reports he frequently gets up at night to urinate and is concerned about possible enlarged prostate.  His wife sees a urologist and will plan to schedule an appointment with him.  Shortness of breath: States he has noticed shortness of breath with exertion for the past couple of months, and usually has to stop and rest.  States has no trouble with breathing.  Denies chest pain, palpitations, feeling lightheaded or dizzy, or lower extremity edema.  Diabetes mellitus: Pt denies increased thirst or urination from baseline. Pt reports medication compliance. No hypoglycemic events. Checking  glucose at home.  FBS range in 110s. Reports a [employer] nurse went to his house a few months ago and did some blood work, but never received the results.    GAD 7 : Generalized Anxiety Score 11/17/2018  Nervous, Anxious, on Edge 0  Control/stop worrying 0  Worry too much - different things 0  Trouble relaxing 0  Restless 0  Easily annoyed or irritable 0  Afraid - awful might happen 0  Total GAD 7 Score 0  Anxiety Difficulty Not difficult at all    Depression screen [facility] 2/9 01/22/2020 05/31/2019 11/17/2018 03/17/2018 06/28/2017  Decreased Interest 0 0 0 0 0  Down, Depressed, Hopeless 0 0 1 0 0  PHQ - 2 Score 0 0 1 0 0  Altered sleeping 0 0 3 1 0  Tired, decreased energy 1 1 2 1  0  Change in appetite 0 0 0 0 0  Feeling bad or failure about yourself  0 0 0 0 0  Trouble concentrating 0 0 0 0 0  Moving slowly or fidgety/restless 0 0 0 0 0  Suicidal thoughts 0 0 0 0 0  PHQ-9 Score 1 1 6 2  0  Difficult doing work/chores Not difficult at all Not difficult at all Not difficult at all Not difficult at all -      Impression and Recommendations:     1. Type 2 diabetes mellitus without complication, without long-term current use of insulin ([place])   2. Allergic rhinitis, unspecified seasonality, unspecified trigger   3. Environmental and seasonal allergies   4. Nocturia   5. Dyspnea on exertion     Type 2 diabetes mellitus without  complication, without long-term current use of insulin: -Last A1c stable, patient is due for A1c recheck -Continue current medication regimen. -Continue ambulatory glucose monitoring. -Follow low glucose and carbohydrate diet.  Allergic rhinitis, environmental and seasonal allergies: -Stable -Continue current medication regimen.  Provided refills. -Continue nasal rinses.  Dyspnea on exertion: -Placed order for chest x-ray for further evaluation. -Discussed with patient pending chest x-ray results, will determine additional work-up for possible  cardiology or pulmonology etiology. Will notify of results once available.  Provided address for [facility] imaging.  Nocturia: -Discussed with patient symptoms could be related to BPH and further evaluation is needed with DRE and lab test.  Patient prefers to see urologist his wife sees and will let me know if he needs a referral. -Plan to check PSA with fasting blood work. -Recommend to limit water before bedtime and reduce caffeinated drinks.   - As part of my medical decision making, I reviewed the following data within the [place] History obtained from pt /family, CMA notes reviewed and incorporated if applicable, Labs reviewed, Radiograph/ tests reviewed if applicable and OV notes from prior OV's with me, as well as any other specialists she/he has seen since seeing me last, were all reviewed and used in my medical decision making process today.    - Additionally, when appropriate, discussion had with patient regarding our treatment plan, and their biases/concerns about that plan were used in my medical decision making today.    - The patient agreed with the plan and demonstrated an understanding of the instructions.   No barriers to understanding were identified.     - The patient was advised to call back or seek an in-person evaluation if the symptoms worsen or if the condition fails to improve as anticipated.   Return in about 3 months (around 04/22/2020) for DM, HLD, HTN; lab visit for FBW (include PSA) within 1-4 weeks .    Orders Placed This Encounter  Procedures  . DG Chest 2 View    Meds ordered this encounter  Medications  . fluticasone (FLONASE) 50 MCG/ACT nasal spray    Sig: USE ONE SPRAY IN EACH NOSTRIL TWICE DAILY AFTER SINUS RINSE    Dispense:  16 g    Refill:  2    Medications Discontinued During This Encounter  Medication Reason  . fluticasone (FLONASE) 50 MCG/ACT nasal spray Reorder       Time spent on visit including pre-visit chart  review and post-visit care was 18 minutes.      The [place] was signed into law in 2016 which includes the topic of electronic health records.  This provides immediate access to information in MyChart.  This includes consultation notes, operative notes, office notes, lab results and pathology reports.  If you have any questions about what you read please let us know at your next visit or call us at the office.  We are right here with you.  Note:  This note was prepared with assistance of Dragon voice recognition software. Occasional wrong-word or sound-a-like substitutions may have occurred due to the inherent limitations of voice recognition software.  __________________________________________________________________________________     Patient Care Team    Relationship Specialty Notifications Start End  Alan Hurst, [place] PCP - General   09/10/19   Christain Sacramento, [place] Referring Physician Optometry  03/02/16    Comment: Blanche East, MD Consulting Physician Dermatology  10/26/16      -Vitals obtained; medications/ allergies reconciled;  personal medical, social, Sx etc.histories were updated by CMA, reviewed by me and are reflected in chart   Patient Active Problem List   Diagnosis Date Noted  . Environmental and seasonal allergies 06/14/2017  . Bite, insect 04/05/2017  . Health education/counseling 03/25/2017  . Allergic rhinitis 03/25/2017  . 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, without long-term current use of insulin ([place]) 03/01/2017  . Mixed diabetic hyperlipidemia associated with type 2 diabetes mellitus ([place]) 03/01/2017  . Hypertension associated with diabetes ([place]) 03/01/2017  . Cough in adult at night primarily when lying down 03/01/2017  . Vitamin D deficiency 03/01/2017  . Rash and nonspecific skin eruption 09/22/2016  . 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 Fall   . Essential hypertension   . Femur fracture ([place]) 08/04/2016  . Chronic kidney disease- stage 3a 01/02/2016  . Type 2 diabetes mellitus without complication, without long-term current use of insulin ([place]) 12/05/2015  . GERD (gastroesophageal reflux disease) 12/05/2015  . 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 Vitamin D insufficiency 12/05/2015  . Vitamin B deficiency 12/05/2015  . Recovering alcoholic in remission ([place]) 12/05/2015  . History of tobacco abuse 12/05/2015     Current Meds  Medication Sig  . aspirin EC 81 MG tablet Take 81 mg by mouth daily.  . Cholecalciferol (VITAMIN D) 2000 units tablet Take 1 tablet by mouth daily.  . fluticasone (FLONASE) 50 MCG/ACT nasal spray USE ONE SPRAY IN EACH NOSTRIL TWICE DAILY AFTER SINUS RINSE  . lisinopril (ZESTRIL) 2.5 MG tablet Take 1 tablet (2.5 mg total) by mouth daily. **PATIENT NEEDS APT FOR FURTHER REFILLS**  . lovastatin (MEVACOR) 40 MG tablet Take 1 tablet (40 mg total) by mouth at bedtime. **PATIENT NEEDS APT FOR FURTHER REFILLS**  . magnesium oxide (MAG-OX) 400 MG tablet Take 400 mg by mouth daily.  . Melatonin 1 MG TABS Take 5 mg by mouth at bedtime.   . metFORMIN (GLUCOPHAGE) 500 MG tablet Take 1 tablet (500 mg total) by mouth daily. **PATIENT NEEDS APT FOR FURTHER REFILLS**  . NON FORMULARY Prostate beta plus- 250 mg daily  . Omega-3 Fatty Acids (FISH OIL) 1000 MG CAPS Take 1 capsule by mouth daily.  Marland Kitchen omeprazole (PRILOSEC) 20 MG capsule Take 1 capsule (20 mg total) by mouth daily. **PATIENT NEEDS APT FOR FURTHER REFILLS**  . triamcinolone cream (KENALOG) 0.1 % Apply 1 application topically 2 (two) times daily.  . [DISCONTINUED] fluticasone (FLONASE) 50 MCG/ACT nasal spray USE ONE SPRAY IN EACH NOSTRIL TWICE DAILY AFTER SINUS RINSE (Patient taking differently: USE ONE SPRAY IN EACH NOSTRIL TWICE DAILY AFTER SINUS RINSE- PRN)     Allergies:  Allergies  Allergen Reactions  . No  Known Allergies      ROS:  See above HPI for pertinent positives and negatives   Objective:   Height 5' 9.5" (1.765 m), weight 198 lb (89.8 kg).  (if some vitals are omitted, this means that patient was UNABLE to obtain them even though they were asked to get them prior to OV today.  They were asked to call us at their earliest convenience with these once obtained. ) General: A & O * 3; sounds in no acute distress; in usual state of health.  Respiratory: speaking in full sentences, no conversational dyspnea Psych: insight appears good, mood- appears full

## 2020-02-02 ENCOUNTER — Other Ambulatory Visit: Payer: Self-pay | Admitting: Physician Assistant

## 2020-02-02 ENCOUNTER — Other Ambulatory Visit: Payer: Medicare Other

## 2020-02-02 ENCOUNTER — Other Ambulatory Visit: Payer: Self-pay

## 2020-02-02 DIAGNOSIS — I152 Hypertension secondary to endocrine disorders: Secondary | ICD-10-CM

## 2020-02-02 DIAGNOSIS — Z125 Encounter for screening for malignant neoplasm of prostate: Secondary | ICD-10-CM

## 2020-02-02 DIAGNOSIS — E782 Mixed hyperlipidemia: Secondary | ICD-10-CM

## 2020-02-02 DIAGNOSIS — E539 Vitamin B deficiency, unspecified: Secondary | ICD-10-CM

## 2020-02-02 DIAGNOSIS — E119 Type 2 diabetes mellitus without complications: Secondary | ICD-10-CM

## 2020-02-02 DIAGNOSIS — E1159 Type 2 diabetes mellitus with other circulatory complications: Secondary | ICD-10-CM

## 2020-02-02 DIAGNOSIS — E559 Vitamin D deficiency, unspecified: Secondary | ICD-10-CM

## 2020-02-03 LAB — COMPREHENSIVE METABOLIC PANEL
ALT: 15 IU/L (ref 0–44)
AST: 19 IU/L (ref 0–40)
Albumin/Globulin Ratio: 1.8 (ref 1.2–2.2)
Albumin: 4.4 g/dL (ref 3.7–4.7)
Alkaline Phosphatase: 74 IU/L (ref 44–121)
BUN/Creatinine Ratio: 9 — ABNORMAL LOW (ref 10–24)
BUN: 10 mg/dL (ref 8–27)
Bilirubin Total: 0.4 mg/dL (ref 0.0–1.2)
CO2: 25 mmol/L (ref 20–29)
Calcium: 9.8 mg/dL (ref 8.6–10.2)
Chloride: 100 mmol/L (ref 96–106)
Creatinine, Ser: 1.16 mg/dL (ref 0.76–1.27)
GFR calc Af Amer: 72 mL/min/{1.73_m2} (ref >59)
GFR calc non Af Amer: 62 mL/min/{1.73_m2} (ref >59)
Globulin, Total: 2.5 g/dL (ref 1.5–4.5)
Glucose: 107 mg/dL — ABNORMAL HIGH (ref 65–99)
Potassium: 4.9 mmol/L (ref 3.5–5.2)
Sodium: 140 mmol/L (ref 134–144)
Total Protein: 6.9 g/dL (ref 6.0–8.5)

## 2020-02-03 LAB — CBC
Hematocrit: 45.4 % (ref 37.5–51.0)
Hemoglobin: 14.6 g/dL (ref 13.0–17.7)
MCH: 27.1 pg (ref 26.6–33.0)
MCHC: 32.2 g/dL (ref 31.5–35.7)
MCV: 84 fL (ref 79–97)
Platelets: 302 10*3/uL (ref 150–450)
RBC: 5.38 x10E6/uL (ref 4.14–5.80)
RDW: 13.4 % (ref 11.6–15.4)
WBC: 5.6 10*3/uL (ref 3.4–10.8)

## 2020-02-03 LAB — LIPID PANEL
Chol/HDL Ratio: 2.9 ratio (ref 0.0–5.0)
Cholesterol, Total: 150 mg/dL (ref 100–199)
HDL: 52 mg/dL (ref >39)
LDL Chol Calc (NIH): 78 mg/dL (ref 0–99)
Triglycerides: 110 mg/dL (ref 0–149)
VLDL Cholesterol Cal: 20 mg/dL (ref 5–40)

## 2020-02-03 LAB — PSA: Prostate Specific Ag, Serum: 10.5 ng/mL — ABNORMAL HIGH (ref 0.0–4.0)

## 2020-02-03 LAB — TSH: TSH: 0.894 u[IU]/mL (ref 0.450–4.500)

## 2020-02-03 LAB — HEMOGLOBIN A1C
Est. average glucose Bld gHb Est-mCnc: 131 mg/dL
Hgb A1c MFr Bld: 6.2 % — ABNORMAL HIGH (ref 4.8–5.6)

## 2020-02-03 LAB — VITAMIN B12: Vitamin B-12: 714 pg/mL (ref 232–1245)

## 2020-02-03 LAB — VITAMIN D 25 HYDROXY (VIT D DEFICIENCY, FRACTURES): Vit D, 25-Hydroxy: 54.9 ng/mL (ref 30.0–100.0)

## 2020-02-05 ENCOUNTER — Telehealth: Payer: Self-pay | Admitting: Physician Assistant

## 2020-02-05 DIAGNOSIS — R972 Elevated prostate specific antigen [PSA]: Secondary | ICD-10-CM

## 2020-02-05 NOTE — Telephone Encounter (Signed)
Patient is aware of the lab results and verbalized understanding. Referral to Urologist placed. AS, CMA

## 2020-02-05 NOTE — Telephone Encounter (Signed)
-----   Message from Lorrene Reid, [place] sent at 02/05/2020 12:48 PM EDT ----- Please call Mr. Dupree and notify most labs are essentially within normal limits or stable from prior. A1c increased from 5.9 to 6.2 and recommend to monitor carbohydrates and glucose. PSA level is elevated and recommend to get established with Urologist of his preference for further evaluation (pt verbalized he was going to schedule an appointment with his wife's urologist). Continue current medication regimen.  Thank you, Herb Grays

## 2020-03-28 ENCOUNTER — Other Ambulatory Visit: Payer: Self-pay | Admitting: Physician Assistant

## 2020-03-28 DIAGNOSIS — E119 Type 2 diabetes mellitus without complications: Secondary | ICD-10-CM

## 2020-04-02 ENCOUNTER — Other Ambulatory Visit: Payer: Self-pay | Admitting: Physician Assistant

## 2020-04-02 DIAGNOSIS — K219 Gastro-esophageal reflux disease without esophagitis: Secondary | ICD-10-CM

## 2020-04-02 DIAGNOSIS — E782 Mixed hyperlipidemia: Secondary | ICD-10-CM

## 2020-04-02 DIAGNOSIS — E119 Type 2 diabetes mellitus without complications: Secondary | ICD-10-CM

## 2020-04-02 DIAGNOSIS — N183 Chronic kidney disease, stage 3 unspecified: Secondary | ICD-10-CM

## 2020-04-03 DIAGNOSIS — C61 Malignant neoplasm of prostate: Secondary | ICD-10-CM | POA: Insufficient documentation

## 2020-04-03 DIAGNOSIS — R972 Elevated prostate specific antigen [PSA]: Secondary | ICD-10-CM | POA: Insufficient documentation

## 2020-06-05 ENCOUNTER — Other Ambulatory Visit: Payer: Self-pay | Admitting: Physician Assistant

## 2020-06-05 DIAGNOSIS — E119 Type 2 diabetes mellitus without complications: Secondary | ICD-10-CM

## 2020-06-06 ENCOUNTER — Telehealth: Payer: Self-pay | Admitting: Physician Assistant

## 2020-06-06 NOTE — Telephone Encounter (Signed)
Patient scheduled for 06-24-20

## 2020-06-06 NOTE — Telephone Encounter (Signed)
Please contact pt to schedule apt per last AVS for further med refills. AS, CMA

## 2020-06-19 DIAGNOSIS — C775 Secondary and unspecified malignant neoplasm of intrapelvic lymph nodes: Secondary | ICD-10-CM | POA: Insufficient documentation

## 2020-06-19 DIAGNOSIS — C61 Malignant neoplasm of prostate: Secondary | ICD-10-CM | POA: Insufficient documentation

## 2020-06-24 ENCOUNTER — Ambulatory Visit: Payer: Medicare Other | Admitting: Physician Assistant

## 2020-06-24 ENCOUNTER — Other Ambulatory Visit: Payer: Self-pay

## 2020-06-24 ENCOUNTER — Other Ambulatory Visit: Payer: Self-pay | Admitting: Physician Assistant

## 2020-06-24 ENCOUNTER — Encounter: Payer: Self-pay | Admitting: Physician Assistant

## 2020-06-24 VITALS — BP 122/67 | HR 91 | Temp 97.8°F | Ht 69.5 in | Wt 191.3 lb

## 2020-06-24 DIAGNOSIS — E1169 Type 2 diabetes mellitus with other specified complication: Secondary | ICD-10-CM | POA: Diagnosis not present

## 2020-06-24 DIAGNOSIS — E119 Type 2 diabetes mellitus without complications: Secondary | ICD-10-CM

## 2020-06-24 DIAGNOSIS — I152 Hypertension secondary to endocrine disorders: Secondary | ICD-10-CM | POA: Diagnosis not present

## 2020-06-24 DIAGNOSIS — E782 Mixed hyperlipidemia: Secondary | ICD-10-CM

## 2020-06-24 DIAGNOSIS — E1159 Type 2 diabetes mellitus with other circulatory complications: Secondary | ICD-10-CM

## 2020-06-24 DIAGNOSIS — C61 Malignant neoplasm of prostate: Secondary | ICD-10-CM

## 2020-06-24 DIAGNOSIS — N183 Chronic kidney disease, stage 3 unspecified: Secondary | ICD-10-CM

## 2020-06-24 DIAGNOSIS — Z122 Encounter for screening for malignant neoplasm of respiratory organs: Secondary | ICD-10-CM | POA: Diagnosis not present

## 2020-06-24 DIAGNOSIS — Z87891 Personal history of nicotine dependence: Secondary | ICD-10-CM

## 2020-06-24 LAB — POCT GLYCOSYLATED HEMOGLOBIN (HGB A1C): Hemoglobin A1C: 6.1 % — AB (ref 4.0–5.6)

## 2020-06-24 LAB — POCT UA - MICROALBUMIN
Albumin/Creatinine Ratio, Urine, POC: <30
Creatinine, POC: 100 mg/dL
Microalbumin Ur, POC: 10 mg/L

## 2020-06-24 NOTE — Patient Instructions (Signed)

## 2020-06-24 NOTE — Assessment & Plan Note (Signed)
-  A1c today 6.1, mildly improved from prior (6.20), continues to be at goal. -Continue current medication regimen. -UA microalbumin performed today, normal. -Will continue to monitor.

## 2020-06-24 NOTE — Assessment & Plan Note (Signed)
>>  ASSESSMENT AND PLAN FOR HYPERTENSION ASSOCIATED WITH DIABETES ([place]) WRITTEN ON 06/24/2020 10:49 AM BY ABONZA, MARITZA, PA-C  -Controlled. -Continue current medication regimen. -Last CMP renal function and electrolytes WNL -Will continue to monitor.

## 2020-06-24 NOTE — Progress Notes (Signed)
Established Patient Office Visit  Subjective:  Patient ID: Alan Hurst, male    DOB: 04/19/1946  Age: 75 y.o. MRN: [id number]  CC:  Chief Complaint  Patient presents with  . Diabetes  . Hypertension    HPI UMER Hurst presents for follow up on diabetes mellitus and hypertension.  Diabetes mellitus: Pt denies increased urination or thirst from baseline. Pt reports medication compliance. Reports one or two hypoglycemic events the past 6-8 months related to prolonged period without eating. Checking glucose at home. FBS range 100-110.  HTN: Pt denies chest pain, palpitations, dizziness, headache or lower  extremity swelling. Taking medication as directed without side effects. Checks BP at home sometimes and readings range <130/80. Pt likes to drink sparking water and also tries to drink plain water. Reports likes to drink no sugar, no carb monster drink.  Previous smoker: Patient has a 48 pack yr history. Does report shortness of breath with non-strenuous activity.  Reports quit smoking about 32 years ago.  Has remained sober from alcohol for 32 years as well.   Past Medical History:  Diagnosis Date  . Diabetes ([place])   . Femur fracture, right ([place])   . GERD (gastroesophageal reflux disease) 12/05/2015   Well controlled on meds  . H/O: substance abuse ([place]) 1999  . Hip fracture ([place])   . HLD (hyperlipidemia) 12/05/2015   10+ yrs at least.   . Hypertension   . 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 Vitamin B deficiency 12/05/2015  . Vitamin D insufficiency 12/05/2015    Past Surgical History:  Procedure Laterality Date  . CATARACT EXTRACTION, BILATERAL  10/2016  . COLONOSCOPY    . FEMUR FRACTURE SURGERY    . HIP FRACTURE SURGERY    . JOINT REPLACEMENT Bilateral    hips  . TIBIA FRACTURE SURGERY    . TOTAL HIP REVISION Right 08/07/2016   Procedure: RIGHT TOTAL HIP REVISION;  Surgeon: Rod Can, MD;  Location: [facility];   Service: Orthopedics;  Laterality: Right;    Family History  Problem Relation Age of Onset  . Alcohol abuse Mother   . Alcohol abuse Father   . Congestive Heart Failure Father   . Kidney disease Father   . Colon cancer Neg Hx   . Esophageal cancer Neg Hx   . Rectal cancer Neg Hx   . Stomach cancer Neg Hx     Social History   Socioeconomic History  . Marital status: Married    Spouse name: Not on file  . Number of children: Not on file  . Years of education: Not on file  . Highest education level: Not on file  Occupational History  . Not on file  Tobacco Use  . Smoking status: Former Smoker    Packs/day: 1.50    Years: 32.00    Pack years: 48.00    Types: Cigarettes    Start date: 05/11/1961    Quit date: 05/11/1993    Years since quitting: 27.1  . Smokeless tobacco: Never Used  Vaping Use  . Vaping Use: Never used  Substance and Sexual Activity  . Alcohol use: No  . Drug use: No  . Sexual activity: Yes    Birth control/protection: None  Other Topics Concern  . Not on file  Social History Narrative  . Not on file   Social Determinants of Health   Financial Resource Strain: Not on file  Food Insecurity: Not on file  Transportation Needs: Not on file  Physical Activity: Not on file  Stress: Not on file  Social Connections: Not on file  Intimate Partner Violence: Not on file    Outpatient Medications Prior to Visit  Medication Sig Dispense Refill  . aspirin EC 81 MG tablet Take 81 mg by mouth daily.    . Cholecalciferol (VITAMIN D) 2000 units tablet Take 1 tablet by mouth daily.    . fluticasone (FLONASE) 50 MCG/ACT nasal spray USE ONE SPRAY IN EACH NOSTRIL TWICE DAILY AFTER SINUS RINSE 16 g 2  . lisinopril (ZESTRIL) 2.5 MG tablet TAKE 1 TABLET BY MOUTH  DAILY 90 tablet 0  . lovastatin (MEVACOR) 40 MG tablet TAKE 1 TABLET BY MOUTH AT  BEDTIME 90 tablet 0  . magnesium oxide (MAG-OX) 400 MG tablet Take 400 mg by mouth daily.    . Melatonin 1 MG TABS Take 5 mg  by mouth at bedtime.     . metFORMIN (GLUCOPHAGE) 500 MG tablet Take 1 tablet (500 mg total) by mouth daily. **NEEDS APT FOR REFILLS** 60 tablet 0  . Omega-3 Fatty Acids (FISH OIL) 1000 MG CAPS Take 1 capsule by mouth daily.    Marland Kitchen omeprazole (PRILOSEC) 20 MG capsule TAKE 1 CAPSULE BY MOUTH  DAILY 90 capsule 0  . triamcinolone cream (KENALOG) 0.1 % Apply 1 application topically 2 (two) times daily. 453.6 g 0  . NON FORMULARY Prostate beta plus- 250 mg daily (Patient not taking: Reported on 06/24/2020)     No facility-administered medications prior to visit.    Allergies  Allergen Reactions  . No Known Allergies     ROS Review of Systems  A fourteen system review of systems was performed and found to be positive as per HPI.    Objective:    Physical Exam General:  Well Developed, well nourished, in no acute distress  Neuro:  Alert and oriented,  extra-ocular muscles intact  HEENT:  Normocephalic, atraumatic, neck supple Skin:  no gross rash, warm, pink. Cardiac:  RRR, S1 S2 wnl's Respiratory:  ECTA B/L with decreased breath sounds, Not using accessory muscles, speaking in full sentences- unlabored. Vascular:  Ext warm, no cyanosis apprec.; no gross edema Psych:  No HI/SI, judgement and insight good, Euthymic mood. Full Affect.   BP 122/67   Pulse 91   Temp 97.8 F (36.6 C)   Ht 5' 9.5" (1.765 m)   Wt 191 lb 4.8 oz (86.8 kg)   SpO2 97%   BMI 27.85 kg/m  Wt Readings from Last 3 Encounters:  06/24/20 191 lb 4.8 oz (86.8 kg)  01/22/20 198 lb (89.8 kg)  06/20/19 202 lb (91.6 kg)     Health Maintenance Due  Topic Date Due  . COVID-19 Vaccine (1) Never done  . OPHTHALMOLOGY EXAM  03/10/2018  . FOOT EXAM  07/18/2019  . INFLUENZA VACCINE  12/10/2019    There are no preventive care reminders to display for this patient.  Lab Results  Component Value Date   TSH 0.894 02/02/2020   Lab Results  Component Value Date   WBC 5.6 02/02/2020   HGB 14.6 02/02/2020   HCT  45.4 02/02/2020   MCV 84 02/02/2020   PLT 302 02/02/2020   Lab Results  Component Value Date   NA 140 02/02/2020   K 4.9 02/02/2020   CO2 25 02/02/2020   GLUCOSE 107 (H) 02/02/2020   BUN 10 02/02/2020   CREATININE 1.16 02/02/2020   BILITOT 0.4 02/02/2020   ALKPHOS  74 02/02/2020   AST 19 02/02/2020   ALT 15 02/02/2020   PROT 6.9 02/02/2020   ALBUMIN 4.4 02/02/2020   CALCIUM 9.8 02/02/2020   ANIONGAP 7 08/10/2016   Lab Results  Component Value Date   CHOL 150 02/02/2020   Lab Results  Component Value Date   HDL 52 02/02/2020   Lab Results  Component Value Date   LDLCALC 78 02/02/2020   Lab Results  Component Value Date   TRIG 110 02/02/2020   Lab Results  Component Value Date   CHOLHDL 2.9 02/02/2020   Lab Results  Component Value Date   HGBA1C 6.1 (A) 06/24/2020      Assessment & Plan:   Problem List Items Addressed This Visit      Cardiovascular and Mediastinum   Hypertension associated with diabetes ([place]) (Chronic)    -Controlled. -Continue current medication regimen. -Last CMP renal function and electrolytes WNL -Will continue to monitor.        Endocrine   Type 2 diabetes mellitus without complication, without long-term current use of insulin (H[facility]) - Primary (Chronic)    -A1c today 6.1, mildly improved from prior (6.20), continues to be at goal. -Continue current medication regimen. -UA microalbumin performed today, normal. -Will continue to monitor.      Relevant Orders   POCT glycosylated hemoglobin (Hb A1C) (Completed)   POCT UA - Microalbumin (Completed)   Mixed diabetic hyperlipidemia associated with type 2 diabetes mellitus ([facility]) (Chronic)    -Last lipid panel: Cholesterol 150, triglycerides 110, HDL 52, LDL 78 -Continue current medication regimen.  Last hepatic function normal. -Follow a heart healthy diet.  Encouraged to increase physical activity. -Will continue to monitor and repeat lipid panel and hepatic function with [facility].         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 Gaynelle [facility]) -MR prostate 06/14/20: CONCLUSION:  1. Prostatic lesion in the left posterior peripheral zone at the midgland and base highly suspicious for the presence of clinically significant cancer. PI-RADS v2.1 score = 5.  2. Broad-based contact with the posterior aspect of the prostate with extraprostatic extension extending into the left neurovascular bundle.  3. Abnormal regional and nonregional lymphadenopathy with enlarged lymph nodes noted to the para-aortic region.    No orders of the defined types were placed in this encounter.   Follow-up: Return in about 4 months (around 10/22/2020) for [place] and [place].   Note:  This note was prepared with assistance of Dragon voice recognition software. Occasional wrong-word or sound-a-like substitutions may have occurred due to the inherent limitations of voice recognition software.  Lorrene Reid, PA-C

## 2020-06-24 NOTE — Assessment & Plan Note (Signed)
-  Controlled. -Continue current medication regimen. -Last CMP renal function and electrolytes WNL -Will continue to monitor.

## 2020-06-24 NOTE — Assessment & Plan Note (Addendum)
-  Last lipid panel: Cholesterol 150, triglycerides 110, HDL 52, LDL 78 -Continue current medication regimen.  Last hepatic function normal. -Follow a heart healthy diet.  Encouraged to increase physical activity. -Will continue to monitor and repeat lipid panel and hepatic function with [facility].

## 2020-06-24 NOTE — Assessment & Plan Note (Signed)
>>  ASSESSMENT AND PLAN FOR TYPE 2 DIABETES MELLITUS WITHOUT COMPLICATION, WITHOUT LONG-TERM CURRENT USE OF INSULIN ([place]) WRITTEN ON 06/24/2020 10:48 AM BY ABONZA, MARITZA, PA-C  -A1c today 6.1, mildly improved from prior (6.20), continues to be at goal. -Continue current medication regimen. -UA microalbumin performed today, normal. -Will continue to monitor.

## 2020-06-25 ENCOUNTER — Other Ambulatory Visit: Payer: Self-pay | Admitting: Physician Assistant

## 2020-06-25 DIAGNOSIS — K219 Gastro-esophageal reflux disease without esophagitis: Secondary | ICD-10-CM

## 2020-08-01 ENCOUNTER — Other Ambulatory Visit: Payer: Self-pay | Admitting: Physician Assistant

## 2020-08-01 DIAGNOSIS — E119 Type 2 diabetes mellitus without complications: Secondary | ICD-10-CM

## 2020-08-28 DIAGNOSIS — Z191 Hormone sensitive malignancy status: Secondary | ICD-10-CM | POA: Diagnosis not present

## 2020-09-04 DIAGNOSIS — Z191 Hormone sensitive malignancy status: Secondary | ICD-10-CM | POA: Diagnosis not present

## 2020-09-16 DIAGNOSIS — R911 Solitary pulmonary nodule: Secondary | ICD-10-CM | POA: Diagnosis not present

## 2020-09-16 DIAGNOSIS — Z191 Hormone sensitive malignancy status: Secondary | ICD-10-CM | POA: Diagnosis not present

## 2020-09-16 DIAGNOSIS — R599 Enlarged lymph nodes, unspecified: Secondary | ICD-10-CM | POA: Diagnosis not present

## 2020-09-26 DIAGNOSIS — R911 Solitary pulmonary nodule: Secondary | ICD-10-CM | POA: Insufficient documentation

## 2020-09-30 DIAGNOSIS — Z5111 Encounter for antineoplastic chemotherapy: Secondary | ICD-10-CM | POA: Diagnosis not present

## 2020-09-30 DIAGNOSIS — Z192 Hormone resistant malignancy status: Secondary | ICD-10-CM | POA: Diagnosis not present

## 2020-10-08 DIAGNOSIS — Z539 Procedure and treatment not carried out, unspecified reason: Secondary | ICD-10-CM | POA: Diagnosis not present

## 2020-10-08 DIAGNOSIS — R911 Solitary pulmonary nodule: Secondary | ICD-10-CM | POA: Diagnosis not present

## 2020-10-09 DIAGNOSIS — C772 Secondary and unspecified malignant neoplasm of intra-abdominal lymph nodes: Secondary | ICD-10-CM | POA: Diagnosis not present

## 2020-10-15 DIAGNOSIS — R7309 Other abnormal glucose: Secondary | ICD-10-CM | POA: Diagnosis not present

## 2020-10-15 DIAGNOSIS — R911 Solitary pulmonary nodule: Secondary | ICD-10-CM | POA: Diagnosis not present

## 2020-10-18 ENCOUNTER — Other Ambulatory Visit: Payer: Self-pay

## 2020-10-18 ENCOUNTER — Other Ambulatory Visit: Payer: Medicare Other

## 2020-10-18 DIAGNOSIS — E539 Vitamin B deficiency, unspecified: Secondary | ICD-10-CM

## 2020-10-18 DIAGNOSIS — E782 Mixed hyperlipidemia: Secondary | ICD-10-CM | POA: Diagnosis not present

## 2020-10-18 DIAGNOSIS — E119 Type 2 diabetes mellitus without complications: Secondary | ICD-10-CM

## 2020-10-18 DIAGNOSIS — I152 Hypertension secondary to endocrine disorders: Secondary | ICD-10-CM | POA: Diagnosis not present

## 2020-10-18 DIAGNOSIS — E559 Vitamin D deficiency, unspecified: Secondary | ICD-10-CM | POA: Diagnosis not present

## 2020-10-18 DIAGNOSIS — E1159 Type 2 diabetes mellitus with other circulatory complications: Secondary | ICD-10-CM | POA: Diagnosis not present

## 2020-10-18 DIAGNOSIS — R972 Elevated prostate specific antigen [PSA]: Secondary | ICD-10-CM

## 2020-10-18 DIAGNOSIS — E1169 Type 2 diabetes mellitus with other specified complication: Secondary | ICD-10-CM | POA: Diagnosis not present

## 2020-10-18 DIAGNOSIS — N1831 Chronic kidney disease, stage 3a: Secondary | ICD-10-CM

## 2020-10-21 ENCOUNTER — Other Ambulatory Visit: Payer: Self-pay | Admitting: Physician Assistant

## 2020-10-21 DIAGNOSIS — Z79899 Other long term (current) drug therapy: Secondary | ICD-10-CM | POA: Diagnosis not present

## 2020-10-21 DIAGNOSIS — E782 Mixed hyperlipidemia: Secondary | ICD-10-CM

## 2020-10-21 DIAGNOSIS — C778 Secondary and unspecified malignant neoplasm of lymph nodes of multiple regions: Secondary | ICD-10-CM | POA: Diagnosis not present

## 2020-10-21 DIAGNOSIS — Z7952 Long term (current) use of systemic steroids: Secondary | ICD-10-CM | POA: Diagnosis not present

## 2020-10-21 DIAGNOSIS — E119 Type 2 diabetes mellitus without complications: Secondary | ICD-10-CM

## 2020-10-21 DIAGNOSIS — Z87891 Personal history of nicotine dependence: Secondary | ICD-10-CM | POA: Diagnosis not present

## 2020-10-21 DIAGNOSIS — N183 Chronic kidney disease, stage 3 unspecified: Secondary | ICD-10-CM

## 2020-10-21 DIAGNOSIS — R911 Solitary pulmonary nodule: Secondary | ICD-10-CM | POA: Diagnosis not present

## 2020-10-21 DIAGNOSIS — Z191 Hormone sensitive malignancy status: Secondary | ICD-10-CM | POA: Diagnosis not present

## 2020-10-21 DIAGNOSIS — R599 Enlarged lymph nodes, unspecified: Secondary | ICD-10-CM | POA: Diagnosis not present

## 2020-10-21 MED ORDER — METFORMIN HCL 500 MG PO TABS: 1.0000 | ORAL_TABLET | Freq: Every day | ORAL | 0 refills | Status: DC

## 2020-10-21 MED ORDER — LOVASTATIN 40 MG PO TABS: 40.0000 mg | ORAL_TABLET | Freq: Every day | ORAL | 0 refills | Status: DC

## 2020-10-21 MED ORDER — LISINOPRIL 2.5 MG PO TABS: 2.5000 mg | ORAL_TABLET | Freq: Every day | ORAL | 0 refills | Status: DC

## 2020-10-21 NOTE — Telephone Encounter (Signed)
Please contact patient to schedule per last AVS for further medication refills. AS, CMA

## 2020-10-23 LAB — COMPREHENSIVE METABOLIC PANEL
ALT: 15 IU/L (ref 0–44)
AST: 18 IU/L (ref 0–40)
Albumin/Globulin Ratio: 2 (ref 1.2–2.2)
Albumin: 4.3 g/dL (ref 3.7–4.7)
Alkaline Phosphatase: 78 IU/L (ref 44–121)
BUN/Creatinine Ratio: 12 (ref 10–24)
BUN: 13 mg/dL (ref 8–27)
Bilirubin Total: 0.5 mg/dL (ref 0.0–1.2)
CO2: 23 mmol/L (ref 20–29)
Calcium: 9.7 mg/dL (ref 8.6–10.2)
Chloride: 102 mmol/L (ref 96–106)
Creatinine, Ser: 1.13 mg/dL (ref 0.76–1.27)
Globulin, Total: 2.2 g/dL (ref 1.5–4.5)
Glucose: 94 mg/dL (ref 65–99)
Potassium: 4.5 mmol/L (ref 3.5–5.2)
Sodium: 142 mmol/L (ref 134–144)
Total Protein: 6.5 g/dL (ref 6.0–8.5)
eGFR: 68 mL/min/{1.73_m2} (ref >59)

## 2020-10-23 LAB — CBC
Hematocrit: 43.5 % (ref 37.5–51.0)
Hemoglobin: 14.1 g/dL (ref 13.0–17.7)
MCH: 27.1 pg (ref 26.6–33.0)
MCHC: 32.4 g/dL (ref 31.5–35.7)
MCV: 84 fL (ref 79–97)
Platelets: 293 10*3/uL (ref 150–450)
RBC: 5.2 x10E6/uL (ref 4.14–5.80)
RDW: 13.3 % (ref 11.6–15.4)
WBC: 6.4 10*3/uL (ref 3.4–10.8)

## 2020-10-23 LAB — HEMOGLOBIN A1C
Est. average glucose Bld gHb Est-mCnc: 131 mg/dL
Hgb A1c MFr Bld: 6.2 % — ABNORMAL HIGH (ref 4.8–5.6)

## 2020-10-23 LAB — LIPID PANEL
Chol/HDL Ratio: 2.8 ratio (ref 0.0–5.0)
Cholesterol, Total: 170 mg/dL (ref 100–199)
HDL: 60 mg/dL (ref >39)
LDL Chol Calc (NIH): 94 mg/dL (ref 0–99)
Triglycerides: 86 mg/dL (ref 0–149)
VLDL Cholesterol Cal: 16 mg/dL (ref 5–40)

## 2020-10-23 LAB — TSH: TSH: 1.24 u[IU]/mL (ref 0.450–4.500)

## 2020-10-23 LAB — PSA: Prostate Specific Ag, Serum: 2.1 ng/mL (ref 0.0–4.0)

## 2020-10-23 LAB — VITAMIN B12: Vitamin B-12: 753 pg/mL (ref 232–1245)

## 2020-10-23 LAB — VITAMIN B1: Thiamine: 108.2 nmol/L (ref 66.5–200.0)

## 2020-10-23 LAB — VITAMIN D 25 HYDROXY (VIT D DEFICIENCY, FRACTURES): Vit D, 25-Hydroxy: 53 ng/mL (ref 30.0–100.0)

## 2020-10-24 ENCOUNTER — Ambulatory Visit (INDEPENDENT_AMBULATORY_CARE_PROVIDER_SITE_OTHER): Payer: Medicare Other | Admitting: Physician Assistant

## 2020-10-24 ENCOUNTER — Encounter: Payer: Self-pay | Admitting: Physician Assistant

## 2020-10-24 ENCOUNTER — Other Ambulatory Visit: Payer: Self-pay

## 2020-10-24 VITALS — BP 126/74 | HR 84 | Temp 97.0°F | Ht 69.0 in | Wt 189.6 lb

## 2020-10-24 DIAGNOSIS — E119 Type 2 diabetes mellitus without complications: Secondary | ICD-10-CM

## 2020-10-24 DIAGNOSIS — Z0000 Encounter for general adult medical examination without abnormal findings: Secondary | ICD-10-CM | POA: Diagnosis not present

## 2020-10-24 NOTE — Patient Instructions (Signed)
Preventive Care 75 Years and Older, Male Preventive care refers to lifestyle choices and visits with your health care provider that can promote health and wellness. This includes: A yearly physical exam. This is also called an annual wellness visit. Regular dental and eye exams. Immunizations. Screening for certain conditions. Healthy lifestyle choices, such as: Eating a healthy diet. Getting regular exercise. Not using drugs or products that contain nicotine and tobacco. Limiting alcohol use. What can I expect for my preventive care visit? Physical exam Your health care provider will check your: Height and weight. These may be used to calculate your BMI (body mass index). BMI is a measurement that tells if you are at a healthy weight. Heart rate and blood pressure. Body temperature. Skin for abnormal spots. Counseling Your health care provider may ask you questions about your: Past medical problems. Family's medical history. Alcohol, tobacco, and drug use. Emotional well-being. Home life and relationship well-being. Sexual activity. Diet, exercise, and sleep habits. History of falls. Memory and ability to understand (cognition). Work and work Statistician. Access to firearms. What immunizations do I need?  Vaccines are usually given at various ages, according to a schedule. Your health care provider will recommend vaccines for you based on your age, medicalhistory, and lifestyle or other factors, such as travel or where you work. What tests do I need? Blood tests Lipid and cholesterol levels. These may be checked every 5 years, or more often depending on your overall health. Hepatitis C test. Hepatitis B test. Screening Lung cancer screening. You may have this screening every year starting at age 75 if you have a 30-pack-year history of smoking and currently smoke or have quit within the past 15 years. Colorectal cancer screening. All adults should have this screening  starting at age 75 and continuing until age 88. Your health care provider may recommend screening at age 6 if you are at increased risk. You will have tests every 1-10 years, depending on your results and the type of screening test. Prostate cancer screening. Recommendations will vary depending on your family history and other risks. Genital exam to check for testicular cancer or hernias. Diabetes screening. This is done by checking your blood sugar (glucose) after you have not eaten for a while (fasting). You may have this done every 1-3 years. Abdominal aortic aneurysm (AAA) screening. You may need this if you are a current or former smoker. STD (sexually transmitted disease) testing, if you are at risk. Follow these instructions at home: Eating and drinking  Eat a diet that includes fresh fruits and vegetables, whole grains, lean protein, and low-fat dairy products. Limit your intake of foods with high amounts of sugar, saturated fats, and salt. Take vitamin and mineral supplements as recommended by your health care provider. Do not drink alcohol if your health care provider tells you not to drink. If you drink alcohol: Limit how much you have to 0-2 drinks a day. Be aware of how much alcohol is in your drink. In the [place] one drink equals one 12 oz bottle of beer (355 mL), one 5 oz glass of wine (148 mL), or one 1 oz glass of hard liquor (44 mL).  Lifestyle Take daily care of your teeth and gums. Brush your teeth every morning and night with fluoride toothpaste. Floss one time each day. Stay active. Exercise for at least 30 minutes 5 or more days each week. Do not use any products that contain nicotine or tobacco, such as cigarettes, e-cigarettes, and chewing tobacco.  If you need help quitting, ask your health care provider. Do not use drugs. If you are sexually active, practice safe sex. Use a condom or other form of protection to prevent STIs (sexually transmitted infections). Talk  with your health care provider about taking a low-dose aspirin or statin. Find healthy ways to cope with stress, such as: Meditation, yoga, or listening to music. Journaling. Talking to a trusted person. Spending time with friends and family. Safety Always wear your seat belt while driving or riding in a vehicle. Do not drive: If you have been drinking alcohol. Do not ride with someone who has been drinking. When you are tired or distracted. While texting. Wear a helmet and other protective equipment during sports activities. If you have firearms in your house, make sure you follow all gun safety procedures. What's next? Visit your health care provider once a year for an annual wellness visit. Ask your health care provider how often you should have your eyes and teeth checked. Stay up to date on all vaccines. This information is not intended to replace advice given to you by your health care provider. Make sure you discuss any questions you have with your healthcare provider. Document Revised: 01/24/2019 Document Reviewed: 04/21/2018 Elsevier Patient Education  2022 [employer].

## 2020-10-24 NOTE — Progress Notes (Signed)
Subjective:   Alan Hurst is a 75 y.o. male who presents for Medicare Annual/Subsequent preventive examination.  Review of Systems    General:   No F/C, unintentional wt loss Pulm:   No DIB, SOB, pleuritic chest pain Card:  No CP, palpitations Abd:  No n/v/d or pain Ext:  No edema    Objective:    Today's Vitals   10/24/20 1009  BP: 126/74  Pulse: 84  Temp: (!) 97 F (36.1 C)  SpO2: 97%  Weight: 189 lb 9.6 oz (86 kg)  Height: 5\' 9"  (1.753 m)   Body mass index is 28 kg/m.  Advanced Directives 10/26/2016 08/05/2016 08/04/2016 08/04/2016 12/05/2015 06/28/2015  Does Patient Have a Medical Advance Directive? No No No No Yes No  Type of Advance Directive - - - - Living will;Healthcare Power of Attorney -  Would patient like information on creating a medical advance directive? No - Patient declined No - Patient declined No - Patient declined - - -    Current Medications (verified) Outpatient Encounter Medications as of 10/24/2020  Medication Sig   abiraterone acetate (ZYTIGA) 250 MG tablet Take by mouth.   aspirin EC 81 MG tablet Take 81 mg by mouth daily.   Cholecalciferol (VITAMIN D) 2000 units tablet Take 1 tablet by mouth daily.   fluticasone (FLONASE) 50 MCG/ACT nasal spray USE ONE SPRAY IN EACH NOSTRIL TWICE DAILY AFTER SINUS RINSE   lisinopril (ZESTRIL) 2.5 MG tablet Take 1 tablet (2.5 mg total) by mouth daily. **NEEDS APT FOR REFILLS**   lovastatin (MEVACOR) 40 MG tablet Take 1 tablet (40 mg total) by mouth at bedtime. **NEEDS APT FOR REFILLS**   magnesium oxide (MAG-OX) 400 MG tablet Take 400 mg by mouth daily.   Melatonin 1 MG TABS Take 5 mg by mouth at bedtime.    metFORMIN (GLUCOPHAGE) 500 MG tablet Take 1 tablet (500 mg total) by mouth daily. **NEEDS APT FOR REFILLS**   NON FORMULARY Prostate beta plus- 250 mg daily   Omega-3 Fatty Acids (FISH OIL) 1000 MG CAPS Take 1 capsule by mouth daily.   omeprazole (PRILOSEC) 20 MG capsule TAKE 1 CAPSULE BY MOUTH  DAILY    predniSONE (DELTASONE) 5 MG tablet Take 5 mg by mouth daily.   triamcinolone cream (KENALOG) 0.1 % Apply 1 application topically 2 (two) times daily.   vitamin B-12 (CYANOCOBALAMIN) 500 MCG tablet Take 500 mcg by mouth daily.   No facility-administered encounter medications on file as of 10/24/2020.    Allergies (verified) No known allergies   History: Past Medical History:  Diagnosis Date   Diabetes ([place])    Femur fracture, right ([place])    GERD (gastroesophageal reflux disease) 12/05/2015   Well controlled on meds   H/O: substance abuse ([place]) 1999   Hip fracture ([place])    HLD (hyperlipidemia) 12/05/2015   10+ yrs at least.    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   Procedure: RIGHT TOTAL HIP REVISION;  Surgeon: Rod Can, MD;  Location: [facility];  Service: Orthopedics;  Laterality: Right;   Family History  Problem Relation Age of Onset   Alcohol abuse Mother    Alcohol abuse Father    Congestive Heart Failure Father    Kidney disease Father    Colon cancer Neg Hx    Esophageal cancer Neg Hx    Rectal cancer Neg Hx    Stomach cancer Neg Hx    Social History   Socioeconomic History   Marital status: Married    Spouse name: Not on file   Number of children: Not on file   Years of education: Not on file   Highest education level: Not on file  Occupational History   Not on file  Tobacco Use   Smoking status: Former    Packs/day: 1.50    Years: 32.00    Pack years: 48.00    Types: Cigarettes    Start date: 05/11/1961    Quit date: 05/11/1993    Years since quitting: 27.4    Smokeless tobacco: Never  Vaping Use   Vaping Use: Never used  Substance and Sexual Activity   Alcohol use: No   Drug use: No   Sexual activity: Yes    Birth control/protection: None  Other Topics Concern   Not on file  Social History Narrative   Not on file   Social Determinants of Health   Financial Resource Strain: Not on file  Food Insecurity: Not on file  Transportation Needs: Not on file  Physical Activity: Not on file  Stress: Not on file  Social Connections: Not on file    Tobacco Counseling Counseling given: Not Answered     Diabetic? yes     Activities of Daily Living In your present state of health, do you have any difficulty performing the following activities: 10/24/2020 06/24/2020  Hearing? Tempie Donning  Vision? N N  Difficulty concentrating or making decisions? N N  Walking or climbing stairs? N N  Dressing or bathing? N N  Doing errands, shopping? N N  Some recent data might be hidden    Patient Care Team: Lorrene Reid, PA-C as PCP - General Christain Sacramento, OD as Referring Physician (Optometry) Lavonna Monarch, MD as Consulting Physician (Dermatology)  Indicate any recent Medical Services you may have received from other than [facility] providers in the past year (date may be approximate).     Assessment:   This is a routine wellness examination for Alan Hurst.  Hearing/Vision screen No results found.  Dietary issues and exercise activities discussed: -Recommend to continue with heart healthy diet and monitor carbohydrates and glucose. Continue with walking 3 times weekly for 15-20 minutes.   Goals Addressed   None   Depression Screen PHQ 2/9 Scores 10/24/2020 06/24/2020 01/22/2020 05/31/2019 11/17/2018 03/17/2018 06/28/2017  PHQ - 2 Score 1 0 0 0 1 0 0  PHQ- 9 Score 2 1 1 1 6 2  0    Fall Risk Fall Risk  10/24/2020 06/24/2020 01/22/2020 05/31/2019 07/18/2018  Falls in the past year? 0 0 0 0 0  Number falls in past yr: 0 - - 0 -  Injury with Fall? 0 - - 0 -   Risk Factor Category  - - - - -  Risk for fall due to : No Fall Risks - - - -  Follow up Falls evaluation completed Falls evaluation completed Falls evaluation completed Falls evaluation completed Falls evaluation completed  Comment - - - - -    FALL RISK PREVENTION PERTAINING TO THE HOME:  Any stairs in or around the home? Yes  If so, are there any without handrails? No  Home free of loose throw rugs in walkways, pet beds, electrical cords, etc? Yes  Adequate lighting in your home to reduce risk of falls? Yes   ASSISTIVE DEVICES UTILIZED TO PREVENT FALLS:  Life alert? No  Use of a cane, walker or w/c? No  Grab bars in the bathroom? Yes  Shower chair or bench in shower? Yes  Elevated toilet seat or a handicapped toilet? Yes   TIMED UP AND GO:  Was the test performed? Yes .  Length of time to ambulate 10 feet: 12 sec.   Gait steady and fast without use of assistive device  Cognitive Function: wnl     6CIT Screen 10/24/2020 05/31/2019 03/17/2018  What Year? 0 points 0 points 0 points  What month? 0 points 0 points 0 points  What time? 0 points 0 points 0 points  Count back from 20 0 points 0 points 0 points  Months in reverse 0 points 0 points 0 points  Repeat phrase 0 points 0 points 0 points  Total Score 0 0 0    Immunizations Immunization History  Administered Date(s) Administered   Fluad Quad(high Dose 65+) 02/16/2019   Influenza Split 05/20/2012   Influenza, High Dose Seasonal PF 03/31/2013, 05/10/2015, 03/02/2016, 03/01/2017, 02/15/2018   PFIZER(Purple Top)SARS-COV-2 Vaccination 06/16/2019, 07/07/2019   Pneumococcal Conjugate-13 05/10/2015, 03/02/2016   Pneumococcal Polysaccharide-23 03/31/2013   Tdap 03/25/2017    TDAP status: Up to date  Flu Vaccine status: Up to date  Pneumococcal vaccine status: Up to date  Covid-19 vaccine status: Completed vaccines  Qualifies for Shingles Vaccine? Yes   Zostavax completed No   Shingrix Completed?: No.     Education has been provided regarding the importance of this vaccine. Patient has been advised to call insurance company to determine out of pocket expense if they have not yet received this vaccine. Advised may also receive vaccine at local pharmacy or Health Dept. Verbalized acceptance and understanding.  Screening Tests Health Maintenance  Topic Date Due   Zoster Vaccines- Shingrix (1 of 2) Never done   OPHTHALMOLOGY EXAM  03/10/2018   COVID-19 Vaccine (3 - Pfizer risk series) 08/04/2019   Hepatitis C Screening  10/26/2028 (Originally 02/15/1964)   INFLUENZA VACCINE  12/09/2020   HEMOGLOBIN A1C  04/19/2021   FOOT EXAM  10/24/2021   COLONOSCOPY (Pts 45-47yrs Insurance coverage will need to be confirmed)  06/19/2022   TETANUS/TDAP  03/26/2027   PNA vac Low Risk Adult  Completed   HPV VACCINES  Aged Out    Health Maintenance  Health Maintenance Due  Topic Date Due   Zoster Vaccines- Shingrix (1 of 2) Never done   OPHTHALMOLOGY EXAM  03/10/2018   COVID-19 Vaccine (3 - Pfizer risk series) 08/04/2019    Colorectal cancer screening: Type of screening: Colonoscopy. Completed 06/20/2019. Repeat every 10 years  Lung Cancer Screening: (Low Dose CT Chest recommended if Age 76-80 years, 30 pack-year currently smoking OR have quit w/in 15years.) does not qualify.   Lung Cancer Screening Referral: n/a  Additional Screening:  Hepatitis C Screening: does qualify; Completed pt declined  Vision Screening: Recommended annual ophthalmology exams for early detection of glaucoma and other disorders of the eye. Is the patient up to date with their annual eye exam?  No  Who is the provider or what is the name of the office in which the patient attends annual eye exams? [facility] If  pt is not established with a provider, would they like to be referred to a provider to establish care? No .   Dental Screening: Recommended annual dental exams for proper oral hygiene  Community Resource Referral / Chronic  Care Management: CRR required this visit?  No   CCM required this visit?  No      Plan:  -Recommend to schedule eye exam. -Discussed most recent labs which are essentially within normal limits or stable from prior.  -Continue current medication regimen and continue to follow up with specialists. -Follow up in 4 months for reg OV: DM, HTN, HLD  I have personally reviewed and noted the following in the patient's chart:   Medical and social history Use of alcohol, tobacco or illicit drugs  Current medications and supplements including opioid prescriptions. Patient is not currently taking opioid prescriptions. Functional ability and status Nutritional status Physical activity Advanced directives List of other physicians Hospitalizations, surgeries, and ER visits in previous 12 months Vitals Screenings to include cognitive, depression, and falls Referrals and appointments  In addition, I have reviewed and discussed with patient certain preventive protocols, quality metrics, and best practice recommendations. A written personalized care plan for preventive services as well as general preventive health recommendations were provided to patient.     Lorrene Reid, PA-C   10/24/2020

## 2020-10-30 DIAGNOSIS — I10 Essential (primary) hypertension: Secondary | ICD-10-CM

## 2020-10-31 DIAGNOSIS — R911 Solitary pulmonary nodule: Secondary | ICD-10-CM | POA: Diagnosis not present

## 2020-11-06 DIAGNOSIS — C3411 Malignant neoplasm of upper lobe, right bronchus or lung: Secondary | ICD-10-CM | POA: Diagnosis not present

## 2020-11-06 DIAGNOSIS — Z87891 Personal history of nicotine dependence: Secondary | ICD-10-CM | POA: Diagnosis not present

## 2020-11-06 DIAGNOSIS — R911 Solitary pulmonary nodule: Secondary | ICD-10-CM | POA: Diagnosis not present

## 2020-11-13 DIAGNOSIS — C3411 Malignant neoplasm of upper lobe, right bronchus or lung: Secondary | ICD-10-CM | POA: Insufficient documentation

## 2020-11-15 DIAGNOSIS — J432 Centrilobular emphysema: Secondary | ICD-10-CM | POA: Diagnosis not present

## 2020-11-15 DIAGNOSIS — C3411 Malignant neoplasm of upper lobe, right bronchus or lung: Secondary | ICD-10-CM | POA: Diagnosis not present

## 2020-11-15 DIAGNOSIS — I2510 Atherosclerotic heart disease of native coronary artery without angina pectoris: Secondary | ICD-10-CM | POA: Diagnosis not present

## 2020-11-15 DIAGNOSIS — C3490 Malignant neoplasm of unspecified part of unspecified bronchus or lung: Secondary | ICD-10-CM | POA: Diagnosis not present

## 2020-11-15 DIAGNOSIS — I700 Atherosclerosis of aorta: Secondary | ICD-10-CM | POA: Diagnosis not present

## 2020-11-15 DIAGNOSIS — G9389 Other specified disorders of brain: Secondary | ICD-10-CM | POA: Diagnosis not present

## 2020-11-24 ENCOUNTER — Other Ambulatory Visit: Payer: Self-pay | Admitting: Physician Assistant

## 2020-11-24 DIAGNOSIS — E119 Type 2 diabetes mellitus without complications: Secondary | ICD-10-CM

## 2020-11-26 DIAGNOSIS — C786 Secondary malignant neoplasm of retroperitoneum and peritoneum: Secondary | ICD-10-CM | POA: Diagnosis not present

## 2020-11-26 DIAGNOSIS — C3492 Malignant neoplasm of unspecified part of left bronchus or lung: Secondary | ICD-10-CM | POA: Diagnosis not present

## 2020-11-26 DIAGNOSIS — Z79899 Other long term (current) drug therapy: Secondary | ICD-10-CM | POA: Diagnosis not present

## 2020-11-26 DIAGNOSIS — Z192 Hormone resistant malignancy status: Secondary | ICD-10-CM | POA: Diagnosis not present

## 2020-11-26 DIAGNOSIS — Z7952 Long term (current) use of systemic steroids: Secondary | ICD-10-CM | POA: Diagnosis not present

## 2020-11-26 DIAGNOSIS — Z87891 Personal history of nicotine dependence: Secondary | ICD-10-CM | POA: Diagnosis not present

## 2020-11-26 DIAGNOSIS — C3411 Malignant neoplasm of upper lobe, right bronchus or lung: Secondary | ICD-10-CM | POA: Diagnosis not present

## 2020-11-27 DIAGNOSIS — Z87891 Personal history of nicotine dependence: Secondary | ICD-10-CM | POA: Diagnosis not present

## 2020-11-27 DIAGNOSIS — C3492 Malignant neoplasm of unspecified part of left bronchus or lung: Secondary | ICD-10-CM | POA: Diagnosis not present

## 2020-12-03 DIAGNOSIS — R911 Solitary pulmonary nodule: Secondary | ICD-10-CM | POA: Diagnosis not present

## 2020-12-03 DIAGNOSIS — C3411 Malignant neoplasm of upper lobe, right bronchus or lung: Secondary | ICD-10-CM | POA: Diagnosis not present

## 2020-12-03 DIAGNOSIS — Z191 Hormone sensitive malignancy status: Secondary | ICD-10-CM | POA: Diagnosis not present

## 2020-12-03 DIAGNOSIS — Z87891 Personal history of nicotine dependence: Secondary | ICD-10-CM | POA: Diagnosis not present

## 2020-12-05 DIAGNOSIS — Z87891 Personal history of nicotine dependence: Secondary | ICD-10-CM | POA: Diagnosis not present

## 2020-12-05 DIAGNOSIS — Z510 Encounter for antineoplastic radiation therapy: Secondary | ICD-10-CM | POA: Diagnosis not present

## 2020-12-05 DIAGNOSIS — C3411 Malignant neoplasm of upper lobe, right bronchus or lung: Secondary | ICD-10-CM | POA: Diagnosis not present

## 2020-12-12 DIAGNOSIS — Z87891 Personal history of nicotine dependence: Secondary | ICD-10-CM | POA: Diagnosis not present

## 2020-12-12 DIAGNOSIS — C3411 Malignant neoplasm of upper lobe, right bronchus or lung: Secondary | ICD-10-CM | POA: Diagnosis not present

## 2020-12-12 DIAGNOSIS — C772 Secondary and unspecified malignant neoplasm of intra-abdominal lymph nodes: Secondary | ICD-10-CM | POA: Diagnosis not present

## 2020-12-12 DIAGNOSIS — C786 Secondary malignant neoplasm of retroperitoneum and peritoneum: Secondary | ICD-10-CM | POA: Diagnosis not present

## 2020-12-20 DIAGNOSIS — C3411 Malignant neoplasm of upper lobe, right bronchus or lung: Secondary | ICD-10-CM | POA: Diagnosis not present

## 2020-12-20 DIAGNOSIS — C772 Secondary and unspecified malignant neoplasm of intra-abdominal lymph nodes: Secondary | ICD-10-CM | POA: Diagnosis not present

## 2020-12-20 DIAGNOSIS — Z510 Encounter for antineoplastic radiation therapy: Secondary | ICD-10-CM | POA: Diagnosis not present

## 2020-12-20 DIAGNOSIS — Z87891 Personal history of nicotine dependence: Secondary | ICD-10-CM | POA: Diagnosis not present

## 2020-12-20 DIAGNOSIS — C786 Secondary malignant neoplasm of retroperitoneum and peritoneum: Secondary | ICD-10-CM | POA: Diagnosis not present

## 2020-12-23 DIAGNOSIS — C3411 Malignant neoplasm of upper lobe, right bronchus or lung: Secondary | ICD-10-CM | POA: Diagnosis not present

## 2020-12-23 DIAGNOSIS — C772 Secondary and unspecified malignant neoplasm of intra-abdominal lymph nodes: Secondary | ICD-10-CM | POA: Diagnosis not present

## 2020-12-23 DIAGNOSIS — Z87891 Personal history of nicotine dependence: Secondary | ICD-10-CM | POA: Diagnosis not present

## 2020-12-23 DIAGNOSIS — C786 Secondary malignant neoplasm of retroperitoneum and peritoneum: Secondary | ICD-10-CM | POA: Diagnosis not present

## 2020-12-25 DIAGNOSIS — C772 Secondary and unspecified malignant neoplasm of intra-abdominal lymph nodes: Secondary | ICD-10-CM | POA: Diagnosis not present

## 2020-12-25 DIAGNOSIS — C3411 Malignant neoplasm of upper lobe, right bronchus or lung: Secondary | ICD-10-CM | POA: Diagnosis not present

## 2020-12-25 DIAGNOSIS — C786 Secondary malignant neoplasm of retroperitoneum and peritoneum: Secondary | ICD-10-CM | POA: Diagnosis not present

## 2020-12-25 DIAGNOSIS — Z87891 Personal history of nicotine dependence: Secondary | ICD-10-CM | POA: Diagnosis not present

## 2020-12-27 DIAGNOSIS — C772 Secondary and unspecified malignant neoplasm of intra-abdominal lymph nodes: Secondary | ICD-10-CM | POA: Diagnosis not present

## 2020-12-27 DIAGNOSIS — Z87891 Personal history of nicotine dependence: Secondary | ICD-10-CM | POA: Diagnosis not present

## 2020-12-27 DIAGNOSIS — C786 Secondary malignant neoplasm of retroperitoneum and peritoneum: Secondary | ICD-10-CM | POA: Diagnosis not present

## 2020-12-27 DIAGNOSIS — C3411 Malignant neoplasm of upper lobe, right bronchus or lung: Secondary | ICD-10-CM | POA: Diagnosis not present

## 2020-12-30 DIAGNOSIS — C786 Secondary malignant neoplasm of retroperitoneum and peritoneum: Secondary | ICD-10-CM | POA: Diagnosis not present

## 2020-12-30 DIAGNOSIS — C772 Secondary and unspecified malignant neoplasm of intra-abdominal lymph nodes: Secondary | ICD-10-CM | POA: Diagnosis not present

## 2020-12-30 DIAGNOSIS — Z87891 Personal history of nicotine dependence: Secondary | ICD-10-CM | POA: Diagnosis not present

## 2020-12-30 DIAGNOSIS — C3411 Malignant neoplasm of upper lobe, right bronchus or lung: Secondary | ICD-10-CM | POA: Diagnosis not present

## 2021-01-07 LAB — HM DIABETES EYE EXAM

## 2021-01-25 ENCOUNTER — Other Ambulatory Visit: Payer: Self-pay | Admitting: Physician Assistant

## 2021-01-25 DIAGNOSIS — E782 Mixed hyperlipidemia: Secondary | ICD-10-CM

## 2021-01-25 DIAGNOSIS — E119 Type 2 diabetes mellitus without complications: Secondary | ICD-10-CM

## 2021-01-25 DIAGNOSIS — N183 Chronic kidney disease, stage 3 unspecified: Secondary | ICD-10-CM

## 2021-01-27 ENCOUNTER — Encounter: Payer: Self-pay | Admitting: Physician Assistant

## 2021-02-24 ENCOUNTER — Other Ambulatory Visit: Payer: Self-pay

## 2021-02-24 ENCOUNTER — Ambulatory Visit (INDEPENDENT_AMBULATORY_CARE_PROVIDER_SITE_OTHER): Payer: Medicare Other | Admitting: Physician Assistant

## 2021-02-24 ENCOUNTER — Encounter: Payer: Self-pay | Admitting: Physician Assistant

## 2021-02-24 VITALS — BP 164/75 | HR 90 | Temp 97.6°F | Ht 69.0 in | Wt 191.0 lb

## 2021-02-24 DIAGNOSIS — E1169 Type 2 diabetes mellitus with other specified complication: Secondary | ICD-10-CM | POA: Diagnosis not present

## 2021-02-24 DIAGNOSIS — E782 Mixed hyperlipidemia: Secondary | ICD-10-CM

## 2021-02-24 DIAGNOSIS — C3411 Malignant neoplasm of upper lobe, right bronchus or lung: Secondary | ICD-10-CM | POA: Diagnosis not present

## 2021-02-24 DIAGNOSIS — Z23 Encounter for immunization: Secondary | ICD-10-CM

## 2021-02-24 DIAGNOSIS — E1159 Type 2 diabetes mellitus with other circulatory complications: Secondary | ICD-10-CM | POA: Diagnosis not present

## 2021-02-24 DIAGNOSIS — E119 Type 2 diabetes mellitus without complications: Secondary | ICD-10-CM | POA: Diagnosis not present

## 2021-02-24 DIAGNOSIS — I152 Hypertension secondary to endocrine disorders: Secondary | ICD-10-CM

## 2021-02-24 DIAGNOSIS — C61 Malignant neoplasm of prostate: Secondary | ICD-10-CM

## 2021-02-24 LAB — POCT GLYCOSYLATED HEMOGLOBIN (HGB A1C): Hemoglobin A1C: 6 % — AB (ref 4.0–5.6)

## 2021-02-24 NOTE — Assessment & Plan Note (Signed)
-  Last lipid panel wnl's, LDL 94. -Continue current medication regimen. -Will repeat lipid panel and hepatic function at follow up visit.

## 2021-02-24 NOTE — Assessment & Plan Note (Signed)
-  Controlled. A1c has improved from 6.2 to 6.0, will continue current medication regimen. Recommend to monitor for hypoglycemic events.  -Will continue to monitor.

## 2021-02-24 NOTE — Assessment & Plan Note (Signed)
>>  ASSESSMENT AND PLAN FOR HYPERTENSION ASSOCIATED WITH DIABETES ([place]) WRITTEN ON 02/24/2021 11:06 AM BY ABONZA, MARITZA, PA-C  -BP elevated in office, patient not taken Lisinopril yet. Last BP in office stable at 126/74. Will reassess BP at follow up visit. Will continue current medication regimen. -Will continue to monitor. Will repeat CMP for medication monitoring at follow up visit.

## 2021-02-24 NOTE — Assessment & Plan Note (Signed)
-  BP elevated in office, patient not taken Lisinopril yet. Last BP in office stable at 126/74. Will reassess BP at follow up visit. Will continue current medication regimen. -Will continue to monitor. Will repeat CMP for medication monitoring at follow up visit.

## 2021-02-24 NOTE — Progress Notes (Signed)
Established Patient Office Visit  Subjective:  Patient ID: Alan Hurst, male    DOB: 11/01/1945  Age: 75 y.o. MRN: [id number]  CC:  Chief Complaint  Patient presents with   Follow-up   Diabetes   Hyperlipidemia   Hypertension    HPI IBAN UTZ presents for follow up on diabetes mellitus, hyperlipidemia and hypertension. Reports having some shortness of breath with activity. Denies hemoptysis, fever, or chills. Patient is followed by oncology for malignant neoplasm of upper lobe of right lung and has an upcoming follow up visit next week.  Diabetes: Pt denies increased urination or thirst. Pt reports medication compliance. No hypoglycemic events. Checking glucose at home every so often. Last FBS was 105 about 3 weeks ago.  HTN: Pt denies chest pain, palpitations, dizziness, orthopnea or lower extremity swelling. Taking medication as directed without side effects. Pt states has not taken his blood pressure medication yet.  HLD: Pt taking medication as directed without issues.  Follows a heart healthy diet.   Past Medical History:  Diagnosis Date   Diabetes ([place])    Femur fracture, right ([place])    GERD (gastroesophageal reflux disease) 12/05/2015   Well controlled on meds   H/O: substance abuse ([place]) 1999   Hip fracture ([place])    HLD (hyperlipidemia) 12/05/2015   10+ yrs at least.    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   [facility]   Sleep disorder 12/05/2015   Vitamin B deficiency 12/05/2015   Vitamin D insufficiency 12/05/2015    Past Surgical History:  Procedure Laterality Date   CATARACT EXTRACTION, BILATERAL  10/2016   COLONOSCOPY     FEMUR FRACTURE SURGERY     HIP FRACTURE SURGERY     JOINT REPLACEMENT Bilateral    hips   TIBIA FRACTURE SURGERY     TOTAL HIP REVISION Right 08/07/2016   Procedure: RIGHT TOTAL HIP REVISION;  Surgeon: Rod Can, MD;  Location: [facility];  Service: Orthopedics;  Laterality: Right;    Family History  Problem  Relation Age of Onset   Alcohol abuse Mother    Alcohol abuse Father    Congestive Heart Failure Father    Kidney disease Father    Colon cancer Neg Hx    Esophageal cancer Neg Hx    Rectal cancer Neg Hx    Stomach cancer Neg Hx     Social History   Socioeconomic History   Marital status: Married    Spouse name: Not on file   Number of children: Not on file   Years of education: Not on file   Highest education level: Not on file  Occupational History   Not on file  Tobacco Use   Smoking status: Former    Packs/day: 1.50    Years: 32.00    Pack years: 48.00    Types: Cigarettes    Start date: 05/11/1961    Quit date: 05/11/1993    Years since quitting: 27.8   Smokeless tobacco: Never  Vaping Use   Vaping Use: Never used  Substance and Sexual Activity   Alcohol use: No   Drug use: No   Sexual activity: Yes    Birth control/protection: None  Other Topics Concern   Not on file  Social History Narrative   Not on file   Social Determinants of Health   Financial Resource Strain: Not on file  Food Insecurity: Not on file  Transportation Needs: Not on file  Physical Activity: Not on file  Stress: Not on file  Social Connections: Not on file  Intimate Partner Violence: Not on file    Outpatient Medications Prior to Visit  Medication Sig Dispense Refill   abiraterone acetate (ZYTIGA) 250 MG tablet Take by mouth.     aspirin EC 81 MG tablet Take 81 mg by mouth daily.     Cholecalciferol (VITAMIN D) 2000 units tablet Take 1 tablet by mouth daily.     lisinopril (ZESTRIL) 2.5 MG tablet TAKE 1 TABLET BY MOUTH  DAILY 90 tablet 1   lovastatin (MEVACOR) 40 MG tablet TAKE 1 TABLET BY MOUTH AT  BEDTIME 90 tablet 1   magnesium oxide (MAG-OX) 400 MG tablet Take 400 mg by mouth daily.     Melatonin 1 MG TABS Take 5 mg by mouth at bedtime.      metFORMIN (GLUCOPHAGE) 500 MG tablet TAKE 1 TABLET BY MOUTH  DAILY 90 tablet 1   NON FORMULARY Prostate beta plus- 250 mg daily      Omega-3 Fatty Acids (FISH OIL) 1000 MG CAPS Take 1 capsule by mouth daily.     omeprazole (PRILOSEC) 20 MG capsule TAKE 1 CAPSULE BY MOUTH  DAILY 90 capsule 3   predniSONE (DELTASONE) 5 MG tablet Take 5 mg by mouth daily.     fluticasone (FLONASE) 50 MCG/ACT nasal spray USE ONE SPRAY IN EACH NOSTRIL TWICE DAILY AFTER SINUS RINSE 16 g 2   triamcinolone cream (KENALOG) 0.1 % Apply 1 application topically 2 (two) times daily. 453.6 g 0   vitamin B-12 (CYANOCOBALAMIN) 500 MCG tablet Take 500 mcg by mouth daily.     No facility-administered medications prior to visit.    Allergies  Allergen Reactions   No Known Allergies    Lemon Oil Rash    ROS Review of Systems Review of Systems:  A fourteen system review of systems was performed and found to be positive as per HPI.   Objective:    Physical Exam General:  Pleasant and cooperative, in no acute distress  Neuro:  Alert and oriented,  extra-ocular muscles intact  HEENT:  Normocephalic, atraumatic, neck supple Skin:  no gross rash, warm, pink. Cardiac:  RRR, S1 S2 Respiratory:  CTA B/L, Not using accessory muscles, speaking in full sentences- unlabored. Vascular:  Ext warm, no cyanosis apprec.; cap RF less 2 sec. Psych:  No HI/SI, judgement and insight good, Euthymic mood. Full Affect.  BP (!) 164/75   Pulse 90   Temp 97.6 F (36.4 C)   Ht 5' 9"  (1.753 m)   Wt 191 lb (86.6 kg)   SpO2 97%   BMI 28.21 kg/m  Wt Readings from Last 3 Encounters:  02/24/21 191 lb (86.6 kg)  10/24/20 189 lb 9.6 oz (86 kg)  06/24/20 191 lb 4.8 oz (86.8 kg)     Health Maintenance Due  Topic Date Due   Zoster Vaccines- Shingrix (1 of 2) Never done   COVID-19 Vaccine (4 - Booster for Pfizer series) 07/19/2020    There are no preventive care reminders to display for this patient.  Lab Results  Component Value Date   TSH 1.240 10/18/2020   Lab Results  Component Value Date   WBC 6.4 10/18/2020   HGB 14.1 10/18/2020   HCT 43.5 10/18/2020    MCV 84 10/18/2020   PLT 293 10/18/2020   Lab Results  Component Value Date   NA 142 10/18/2020   K 4.5 10/18/2020   CO2 23 10/18/2020   GLUCOSE 94 10/18/2020  BUN 13 10/18/2020   CREATININE 1.13 10/18/2020   BILITOT 0.5 10/18/2020   ALKPHOS 78 10/18/2020   AST 18 10/18/2020   ALT 15 10/18/2020   PROT 6.5 10/18/2020   ALBUMIN 4.3 10/18/2020   CALCIUM 9.7 10/18/2020   ANIONGAP 7 08/10/2016   EGFR 68 10/18/2020   Lab Results  Component Value Date   CHOL 170 10/18/2020   Lab Results  Component Value Date   HDL 60 10/18/2020   Lab Results  Component Value Date   LDLCALC 94 10/18/2020   Lab Results  Component Value Date   TRIG 86 10/18/2020   Lab Results  Component Value Date   CHOLHDL 2.8 10/18/2020   Lab Results  Component Value Date   HGBA1C 6.0 (A) 02/24/2021      Assessment & Plan:   Problem List Items Addressed This Visit       Cardiovascular and Mediastinum   Hypertension associated with diabetes ([place]) (Chronic)    -BP elevated in office, patient not taken Lisinopril yet. Last BP in office stable at 126/74. Will reassess BP at follow up visit. Will continue current medication regimen. -Will continue to monitor. Will repeat CMP for medication monitoring at follow up visit.        Endocrine   Type 2 diabetes mellitus without complication, without long-term current use of insulin (HCC) - Primary (Chronic)    -Controlled. A1c has improved from 6.2 to 6.0, will continue current medication regimen. Recommend to monitor for hypoglycemic events.  -Will continue to monitor.      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 -Continue current medication regimen. -Will repeat lipid panel and hepatic function at follow up visit.      Other Visit Diagnoses     Need for influenza vaccination       Relevant  Orders   Flu Vaccine QUAD High Dose(Fluad) (Completed)   Malignant neoplasm of upper lobe of right lung (HCC)          Malignant neoplasm of upper lobe of right lung: -Followed by [facility]. S/p SBRT -Patient prefers to discuss with oncologist referral to pulmonologist before proceeding with referral.   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 -On Lupron and Zytiga.   No orders of the defined types were placed in this encounter.   Follow-up: Return in about 4 months (around 06/27/2021) for DM, HTN, HLD and FBW (lipid panel, cmp, a1c).    Lorrene Reid, PA-C

## 2021-02-24 NOTE — Patient Instructions (Signed)

## 2021-02-24 NOTE — Assessment & Plan Note (Signed)
>>  ASSESSMENT AND PLAN FOR TYPE 2 DIABETES MELLITUS WITHOUT COMPLICATION, WITHOUT LONG-TERM CURRENT USE OF INSULIN (Alan Hurst) WRITTEN ON 02/24/2021 11:06 AM BY ABONZA, MARITZA, PA-C  -Controlled. A1c has improved from 6.2 to 6.0, will continue current medication regimen. Recommend to monitor for hypoglycemic events.  -Will continue to monitor.

## 2021-03-04 DIAGNOSIS — D649 Anemia, unspecified: Secondary | ICD-10-CM | POA: Diagnosis not present

## 2021-03-04 DIAGNOSIS — C3411 Malignant neoplasm of upper lobe, right bronchus or lung: Secondary | ICD-10-CM | POA: Diagnosis not present

## 2021-03-04 DIAGNOSIS — Z923 Personal history of irradiation: Secondary | ICD-10-CM | POA: Diagnosis not present

## 2021-03-04 DIAGNOSIS — C3490 Malignant neoplasm of unspecified part of unspecified bronchus or lung: Secondary | ICD-10-CM | POA: Diagnosis not present

## 2021-03-04 DIAGNOSIS — Z87891 Personal history of nicotine dependence: Secondary | ICD-10-CM | POA: Diagnosis not present

## 2021-03-04 DIAGNOSIS — R0602 Shortness of breath: Secondary | ICD-10-CM | POA: Diagnosis not present

## 2021-03-04 DIAGNOSIS — C786 Secondary malignant neoplasm of retroperitoneum and peritoneum: Secondary | ICD-10-CM | POA: Diagnosis not present

## 2021-03-04 DIAGNOSIS — J849 Interstitial pulmonary disease, unspecified: Secondary | ICD-10-CM | POA: Diagnosis not present

## 2021-03-10 DIAGNOSIS — J432 Centrilobular emphysema: Secondary | ICD-10-CM | POA: Diagnosis not present

## 2021-03-10 DIAGNOSIS — I700 Atherosclerosis of aorta: Secondary | ICD-10-CM | POA: Diagnosis not present

## 2021-03-10 DIAGNOSIS — I2510 Atherosclerotic heart disease of native coronary artery without angina pectoris: Secondary | ICD-10-CM | POA: Diagnosis not present

## 2021-03-10 DIAGNOSIS — J90 Pleural effusion, not elsewhere classified: Secondary | ICD-10-CM | POA: Diagnosis not present

## 2021-03-10 DIAGNOSIS — C3411 Malignant neoplasm of upper lobe, right bronchus or lung: Secondary | ICD-10-CM | POA: Diagnosis not present

## 2021-03-10 DIAGNOSIS — J439 Emphysema, unspecified: Secondary | ICD-10-CM | POA: Diagnosis not present

## 2021-03-11 DIAGNOSIS — J449 Chronic obstructive pulmonary disease, unspecified: Secondary | ICD-10-CM | POA: Insufficient documentation

## 2021-03-11 DIAGNOSIS — C3411 Malignant neoplasm of upper lobe, right bronchus or lung: Secondary | ICD-10-CM | POA: Diagnosis not present

## 2021-03-11 DIAGNOSIS — R0609 Other forms of dyspnea: Secondary | ICD-10-CM | POA: Diagnosis not present

## 2021-03-17 DIAGNOSIS — Z5111 Encounter for antineoplastic chemotherapy: Secondary | ICD-10-CM | POA: Diagnosis not present

## 2021-03-17 DIAGNOSIS — C3411 Malignant neoplasm of upper lobe, right bronchus or lung: Secondary | ICD-10-CM | POA: Diagnosis not present

## 2021-04-09 DIAGNOSIS — R0609 Other forms of dyspnea: Secondary | ICD-10-CM | POA: Diagnosis not present

## 2021-04-15 DIAGNOSIS — Z192 Hormone resistant malignancy status: Secondary | ICD-10-CM | POA: Diagnosis not present

## 2021-04-16 DIAGNOSIS — C775 Secondary and unspecified malignant neoplasm of intrapelvic lymph nodes: Secondary | ICD-10-CM | POA: Diagnosis not present

## 2021-05-05 ENCOUNTER — Other Ambulatory Visit: Payer: Self-pay | Admitting: Physician Assistant

## 2021-05-05 DIAGNOSIS — E119 Type 2 diabetes mellitus without complications: Secondary | ICD-10-CM

## 2021-05-10 ENCOUNTER — Other Ambulatory Visit: Payer: Self-pay | Admitting: Physician Assistant

## 2021-05-10 DIAGNOSIS — J3089 Other allergic rhinitis: Secondary | ICD-10-CM

## 2021-05-10 DIAGNOSIS — J309 Allergic rhinitis, unspecified: Secondary | ICD-10-CM

## 2021-05-13 ENCOUNTER — Other Ambulatory Visit: Payer: Self-pay | Admitting: Physician Assistant

## 2021-05-13 DIAGNOSIS — J309 Allergic rhinitis, unspecified: Secondary | ICD-10-CM

## 2021-05-13 DIAGNOSIS — J3089 Other allergic rhinitis: Secondary | ICD-10-CM

## 2021-05-15 DIAGNOSIS — C3411 Malignant neoplasm of upper lobe, right bronchus or lung: Secondary | ICD-10-CM | POA: Diagnosis not present

## 2021-05-15 DIAGNOSIS — J449 Chronic obstructive pulmonary disease, unspecified: Secondary | ICD-10-CM | POA: Diagnosis not present

## 2021-05-15 DIAGNOSIS — J700 Acute pulmonary manifestations due to radiation: Secondary | ICD-10-CM | POA: Insufficient documentation

## 2021-05-30 ENCOUNTER — Other Ambulatory Visit: Payer: Self-pay | Admitting: Physician Assistant

## 2021-05-30 DIAGNOSIS — K219 Gastro-esophageal reflux disease without esophagitis: Secondary | ICD-10-CM

## 2021-06-04 DIAGNOSIS — C775 Secondary and unspecified malignant neoplasm of intrapelvic lymph nodes: Secondary | ICD-10-CM | POA: Diagnosis not present

## 2021-06-04 DIAGNOSIS — C786 Secondary malignant neoplasm of retroperitoneum and peritoneum: Secondary | ICD-10-CM | POA: Diagnosis not present

## 2021-06-04 DIAGNOSIS — C3411 Malignant neoplasm of upper lobe, right bronchus or lung: Secondary | ICD-10-CM | POA: Diagnosis not present

## 2021-06-04 DIAGNOSIS — Z191 Hormone sensitive malignancy status: Secondary | ICD-10-CM | POA: Diagnosis not present

## 2021-06-04 DIAGNOSIS — D649 Anemia, unspecified: Secondary | ICD-10-CM | POA: Diagnosis not present

## 2021-06-10 DIAGNOSIS — I700 Atherosclerosis of aorta: Secondary | ICD-10-CM | POA: Diagnosis not present

## 2021-06-10 DIAGNOSIS — R911 Solitary pulmonary nodule: Secondary | ICD-10-CM | POA: Diagnosis not present

## 2021-06-10 DIAGNOSIS — C3490 Malignant neoplasm of unspecified part of unspecified bronchus or lung: Secondary | ICD-10-CM | POA: Diagnosis not present

## 2021-06-10 DIAGNOSIS — J90 Pleural effusion, not elsewhere classified: Secondary | ICD-10-CM | POA: Diagnosis not present

## 2021-06-10 DIAGNOSIS — C3411 Malignant neoplasm of upper lobe, right bronchus or lung: Secondary | ICD-10-CM | POA: Diagnosis not present

## 2021-06-15 ENCOUNTER — Other Ambulatory Visit: Payer: Self-pay | Admitting: Physician Assistant

## 2021-06-15 DIAGNOSIS — E782 Mixed hyperlipidemia: Secondary | ICD-10-CM

## 2021-06-15 DIAGNOSIS — N183 Chronic kidney disease, stage 3 unspecified: Secondary | ICD-10-CM

## 2021-06-15 DIAGNOSIS — E119 Type 2 diabetes mellitus without complications: Secondary | ICD-10-CM

## 2021-06-26 DIAGNOSIS — C3411 Malignant neoplasm of upper lobe, right bronchus or lung: Secondary | ICD-10-CM | POA: Diagnosis not present

## 2021-06-26 DIAGNOSIS — C3490 Malignant neoplasm of unspecified part of unspecified bronchus or lung: Secondary | ICD-10-CM | POA: Diagnosis not present

## 2021-06-26 DIAGNOSIS — E1165 Type 2 diabetes mellitus with hyperglycemia: Secondary | ICD-10-CM | POA: Diagnosis not present

## 2021-06-26 DIAGNOSIS — Z923 Personal history of irradiation: Secondary | ICD-10-CM | POA: Diagnosis not present

## 2021-06-27 DIAGNOSIS — Z923 Personal history of irradiation: Secondary | ICD-10-CM | POA: Diagnosis not present

## 2021-06-27 DIAGNOSIS — D649 Anemia, unspecified: Secondary | ICD-10-CM | POA: Diagnosis not present

## 2021-06-27 DIAGNOSIS — C775 Secondary and unspecified malignant neoplasm of intrapelvic lymph nodes: Secondary | ICD-10-CM | POA: Diagnosis not present

## 2021-06-27 DIAGNOSIS — C3411 Malignant neoplasm of upper lobe, right bronchus or lung: Secondary | ICD-10-CM | POA: Diagnosis not present

## 2021-06-27 DIAGNOSIS — C786 Secondary malignant neoplasm of retroperitoneum and peritoneum: Secondary | ICD-10-CM | POA: Diagnosis not present

## 2021-06-27 DIAGNOSIS — Z191 Hormone sensitive malignancy status: Secondary | ICD-10-CM | POA: Diagnosis not present

## 2021-07-01 ENCOUNTER — Encounter: Payer: Self-pay | Admitting: Physician Assistant

## 2021-07-01 ENCOUNTER — Ambulatory Visit (INDEPENDENT_AMBULATORY_CARE_PROVIDER_SITE_OTHER): Payer: Medicare Other | Admitting: Physician Assistant

## 2021-07-01 ENCOUNTER — Other Ambulatory Visit: Payer: Self-pay

## 2021-07-01 VITALS — BP 133/73 | HR 86 | Temp 97.6°F | Ht 69.0 in | Wt 180.0 lb

## 2021-07-01 DIAGNOSIS — E1169 Type 2 diabetes mellitus with other specified complication: Secondary | ICD-10-CM

## 2021-07-01 DIAGNOSIS — E119 Type 2 diabetes mellitus without complications: Secondary | ICD-10-CM | POA: Diagnosis not present

## 2021-07-01 DIAGNOSIS — K219 Gastro-esophageal reflux disease without esophagitis: Secondary | ICD-10-CM | POA: Diagnosis not present

## 2021-07-01 DIAGNOSIS — E1159 Type 2 diabetes mellitus with other circulatory complications: Secondary | ICD-10-CM

## 2021-07-01 DIAGNOSIS — I152 Hypertension secondary to endocrine disorders: Secondary | ICD-10-CM

## 2021-07-01 DIAGNOSIS — E782 Mixed hyperlipidemia: Secondary | ICD-10-CM

## 2021-07-01 LAB — POCT GLYCOSYLATED HEMOGLOBIN (HGB A1C): Hemoglobin A1C: 6 % — AB (ref 4.0–5.6)

## 2021-07-01 NOTE — Assessment & Plan Note (Signed)
>>  ASSESSMENT AND PLAN FOR HYPERTENSION ASSOCIATED WITH DIABETES ([place]) WRITTEN ON 07/01/2021  8:45 AM BY ABONZA, MARITZA, PA-C  -BP today stable. Discussed with patient to start monitoring BP/pulse at home daily and if BP consistently >149/90 then to contact the office for a nurse visit to repeat BP and also bring his BP device. Pt verbalized understanding. -Continue current medication regimen. Reviewed recent CMP, renal function and electrolytes normal. -Will continue to monitor.

## 2021-07-01 NOTE — Assessment & Plan Note (Signed)
>>  ASSESSMENT AND PLAN FOR TYPE 2 DIABETES MELLITUS WITHOUT COMPLICATION, WITHOUT LONG-TERM CURRENT USE OF INSULIN ([place]) WRITTEN ON 07/01/2021  8:47 AM BY ABONZA, MARITZA, PA-C  -A1c stable at 6.0, will continue current medication regimen. -Continue ambulatory glucose monitoring. -Will continue to monitor.

## 2021-07-01 NOTE — Assessment & Plan Note (Signed)
-  Controlled with medication regimen. -Will continue to monitor.

## 2021-07-01 NOTE — Assessment & Plan Note (Signed)
-  BP today stable. Discussed with patient to start monitoring BP/pulse at home daily and if BP consistently >149/90 then to contact the office for a nurse visit to repeat BP and also bring his BP device. Pt verbalized understanding. -Continue current medication regimen. Reviewed recent CMP, renal function and electrolytes normal. -Will continue to monitor.

## 2021-07-01 NOTE — Patient Instructions (Signed)
How to Take Your Blood Pressure ?Blood pressure measures how strongly your blood is pressing against the walls of your arteries. Arteries are blood vessels that carry blood from your heart throughout your body. You can take your blood pressure at home with a machine. ?You may need to check your blood pressure at home: ?To check if you have high blood pressure (hypertension). ?To check your blood pressure over time. ?To make sure your blood pressure medicine is working. ?Supplies needed: ?Blood pressure machine, or monitor. ?Dining room chair to sit in. ?Table or desk. ?Small notebook. ?Pencil or pen. ?How to prepare ?Avoid these things for 30 minutes before checking your blood pressure: ?Having drinks with caffeine in them, such as coffee or tea. ?Drinking alcohol. ?Eating. ?Smoking. ?Exercising. ?Do these things five minutes before checking your blood pressure: ?Go to the bathroom and pee (urinate). ?Sit in a dining chair. Do not sit on a soft couch or an armchair. ?Be quiet. Do not talk. ?How to take your blood pressure ?Follow the instructions that came with your machine. If you have a digital blood pressure monitor, these may be the instructions: ?Sit up straight. ?Place your feet on the floor. Do not cross your ankles or legs. ?Rest your left arm at the level of your heart. You may rest it on a table, desk, or chair. ?Pull up your shirt sleeve. ?Wrap the blood pressure cuff around the upper part of your left arm. The cuff should be 1 inch (2.5 cm) above your elbow. It is best to wrap the cuff around bare skin. ?Fit the cuff snugly around your arm. You should be able to place only one finger between the cuff and your arm. ?Place the cord so that it rests in the bend of your elbow. ?Press the power button. ?Sit quietly while the cuff fills with air and loses air. ?Write down the numbers on the screen. ?Wait 2-3 minutes and then repeat steps 1-10. ?What do the numbers mean? ?Two numbers make up your blood  pressure. The first number is called systolic pressure. The second is called diastolic pressure. An example of a blood pressure reading is "120 over 80" (or 120/80). ?If you are an adult and do not have a medical condition, use this guide to find out if your blood pressure is normal: ?Normal ?First number: below 120. ?Second number: below 80. ?Elevated ?First number: 120-129. ?Second number: below 80. ?Hypertension stage 1 ?First number: 130-139. ?Second number: 80-89. ?Hypertension stage 2 ?First number: 140 or above. ?Second number: 90 or above. ?Your blood pressure is above normal even if only the first or only the second number is above normal. ?Follow these instructions at home: ?Medicines ?Take over-the-counter and prescription medicines only as told by your doctor. ?Tell your doctor if your medicine is causing side effects. ?General instructions ?Check your blood pressure as often as your doctor tells you to. ?Check your blood pressure at the same time every day. ?Take your monitor to your next doctor's appointment. Your doctor will: ?Make sure you are using it correctly. ?Make sure it is working right. ?Understand what your blood pressure numbers should be. ?Keep all follow-up visits as told by your doctor. This is important. ?General tips ?You will need a blood pressure machine, or monitor. Your doctor can suggest a monitor. You can buy one at a drugstore or online. When choosing one: ?Choose one with an arm cuff. ?Choose one that wraps around your upper arm. Only one finger should fit between   your arm and the cuff. ?Do not choose one that measures your blood pressure from your wrist or finger. ?Where to find more information ?American Heart Association: www.heart.org ?Contact a doctor if: ?Your blood pressure keeps being high. ?Your blood pressure is suddenly low. ?Get help right away if: ?Your first blood pressure number is higher than 180. ?Your second blood pressure number is higher than  120. ?Summary ?Check your blood pressure at the same time every day. ?Avoid caffeine, alcohol, smoking, and exercise for 30 minutes before checking your blood pressure. ?Make sure you understand what your blood pressure numbers should be. ?This information is not intended to replace advice given to you by your health care provider. Make sure you discuss any questions you have with your health care provider. ?Document Revised: 03/06/2020 Document Reviewed: 04/21/2019 ?Elsevier Patient Education ? 2022 Elsevier Inc. ? ?

## 2021-07-01 NOTE — Progress Notes (Signed)
Established patient visit   Patient: Alan Hurst   DOB: March 19, 1946   75 y.o. Male  MRN: [id number] Visit Date: 07/01/2021  Chief Complaint  Patient presents with   Follow-up   Diabetes   Hypertension   Hyperlipidemia   Subjective    HPI  Patient presents for follow up on diabetes mellitus, hypertension and hyperlipidemia.  Diabetes: Pt denies increased urination or thirst. Pt reports medication compliance. No hypoglycemic events. Checking glucose at home. FBS range from 100-110.  HTN: Pt denies palpitations, dizziness or lower extremity swelling. Report sometimes feels chest tightness when laying flat and uses inhaler which provides relief. Taking medication as directed without side effects. Does not check BP at home and but BP readings have been elevated around 165/85 when at some of his other doctor visits. Pt follows a low salt diet.  HLD: Pt taking medication as directed without problems. No diet changes. Tries to have a balanced diet.   GERD: Reports takes omeprazole 20 mg daily. Denies exacerbations.    Medications: Outpatient Medications Prior to Visit  Medication Sig   abiraterone acetate (ZYTIGA) 250 MG tablet Take by mouth.   aspirin EC 81 MG tablet Take 81 mg by mouth daily.   Cholecalciferol (VITAMIN D) 2000 units tablet Take 1 tablet by mouth daily.   fluticasone (FLONASE) 50 MCG/ACT nasal spray USE 1 SPRAY IN EACH NOSTRIL TWICE DAILY AFTER SINUS RINSE   lisinopril (ZESTRIL) 2.5 MG tablet TAKE 1 TABLET BY MOUTH  DAILY   lovastatin (MEVACOR) 40 MG tablet TAKE 1 TABLET BY MOUTH AT  BEDTIME   magnesium oxide (MAG-OX) 400 MG tablet Take 400 mg by mouth daily.   Melatonin 1 MG TABS Take 5 mg by mouth at bedtime.    metFORMIN (GLUCOPHAGE) 500 MG tablet TAKE 1 TABLET BY MOUTH  DAILY   NON FORMULARY Prostate beta plus- 250 mg daily   Omega-3 Fatty Acids (FISH OIL) 1000 MG CAPS Take 1 capsule by mouth daily.   omeprazole (PRILOSEC) 20 MG capsule TAKE 1 CAPSULE BY  MOUTH  DAILY   predniSONE (DELTASONE) 5 MG tablet Take 5 mg by mouth daily.   No facility-administered medications prior to visit.    Review of Systems Review of Systems:  A fourteen system review of systems was performed and found to be positive as per HPI.     Objective    BP 133/73    Pulse 86    Temp 97.6 F (36.4 C)    Ht 5\' 9"  (1.753 m)    Wt 180 lb (81.6 kg)    SpO2 98%    BMI 26.58 kg/m  BP Readings from Last 3 Encounters:  07/01/21 133/73  02/24/21 (!) 164/75  10/24/20 126/74   Wt Readings from Last 3 Encounters:  07/01/21 180 lb (81.6 kg)  02/24/21 191 lb (86.6 kg)  10/24/20 189 lb 9.6 oz (86 kg)    Physical Exam  General:  Pleasant and cooperative, appropriate for stated age.  Neuro:  Alert and oriented,  extra-ocular muscles intact  HEENT:  Normocephalic, atraumatic, neck supple  Skin:  no gross rash, warm, pink. Cardiac:  RRR, S1 S2 Respiratory: CTA B/L  Vascular:  Ext warm, no cyanosis apprec.; cap RF less 2 sec. Psych:  No HI/SI, judgement and insight good, Euthymic mood. Full Affect.   Results for orders placed or performed in visit on 07/01/21  POCT glycosylated hemoglobin (Hb A1C)  Result Value Ref Range   Hemoglobin A1C 6.0 (A) 4.0 -  5.6 %   HbA1c POC (<> result, manual entry)     HbA1c, POC (prediabetic range)     HbA1c, POC (controlled diabetic range)      Assessment & Plan      Problem List Items Addressed This Visit       Cardiovascular and Mediastinum   Hypertension associated with diabetes ([place]) (Chronic)    -BP today stable. Discussed with patient to start monitoring BP/pulse at home daily and if BP consistently >149/90 then to contact the office for a nurse visit to repeat BP and also bring his BP device. Pt verbalized understanding. -Continue current medication regimen. Reviewed recent CMP, renal function and electrolytes normal. -Will continue to monitor.        Digestive   GERD (gastroesophageal reflux disease) (Chronic)     -Controlled with medication regimen. -Will continue to monitor.        Endocrine   Type 2 diabetes mellitus without complication, without long-term current use of insulin (HCC) - Primary (Chronic)    -A1c stable at 6.0, will continue current medication regimen. -Continue ambulatory glucose monitoring. -Will continue to monitor.      Relevant Orders   POCT glycosylated hemoglobin (Hb A1C) (Completed)   Mixed diabetic hyperlipidemia associated with type 2 diabetes mellitus (HCC) (Chronic)    -Last lipid panel wnl's. Will continue current medication regimen. -Will repeat lipid panel and hepatic function with [facility].       Return in about 4 months (around 10/29/2021) for [place] and [place].        Lorrene Reid, PA-C  [facility] [place] [phone] (phone) [phone] (fax)  [place]

## 2021-07-01 NOTE — Assessment & Plan Note (Signed)
-  A1c stable at 6.0, will continue current medication regimen. -Continue ambulatory glucose monitoring. -Will continue to monitor.

## 2021-07-01 NOTE — Assessment & Plan Note (Signed)
-  Last lipid panel wnl's. Will continue current medication regimen. -Will repeat lipid panel and hepatic function with [facility].

## 2021-07-17 DIAGNOSIS — C3411 Malignant neoplasm of upper lobe, right bronchus or lung: Secondary | ICD-10-CM | POA: Diagnosis not present

## 2021-07-17 DIAGNOSIS — J449 Chronic obstructive pulmonary disease, unspecified: Secondary | ICD-10-CM | POA: Diagnosis not present

## 2021-07-18 DIAGNOSIS — C3411 Malignant neoplasm of upper lobe, right bronchus or lung: Secondary | ICD-10-CM | POA: Diagnosis not present

## 2021-08-06 ENCOUNTER — Other Ambulatory Visit: Payer: Self-pay | Admitting: Physician Assistant

## 2021-08-06 DIAGNOSIS — J3089 Other allergic rhinitis: Secondary | ICD-10-CM

## 2021-08-06 DIAGNOSIS — J309 Allergic rhinitis, unspecified: Secondary | ICD-10-CM

## 2021-08-19 ENCOUNTER — Other Ambulatory Visit: Payer: Self-pay | Admitting: Physician Assistant

## 2021-08-19 DIAGNOSIS — K219 Gastro-esophageal reflux disease without esophagitis: Secondary | ICD-10-CM

## 2021-08-31 ENCOUNTER — Other Ambulatory Visit: Payer: Self-pay | Admitting: Physician Assistant

## 2021-08-31 DIAGNOSIS — N183 Chronic kidney disease, stage 3 unspecified: Secondary | ICD-10-CM

## 2021-08-31 DIAGNOSIS — E782 Mixed hyperlipidemia: Secondary | ICD-10-CM

## 2021-08-31 DIAGNOSIS — E119 Type 2 diabetes mellitus without complications: Secondary | ICD-10-CM

## 2021-09-01 DIAGNOSIS — D630 Anemia in neoplastic disease: Secondary | ICD-10-CM | POA: Diagnosis not present

## 2021-09-01 DIAGNOSIS — C3411 Malignant neoplasm of upper lobe, right bronchus or lung: Secondary | ICD-10-CM | POA: Diagnosis not present

## 2021-09-01 DIAGNOSIS — Z923 Personal history of irradiation: Secondary | ICD-10-CM | POA: Diagnosis not present

## 2021-09-01 DIAGNOSIS — Z191 Hormone sensitive malignancy status: Secondary | ICD-10-CM | POA: Diagnosis not present

## 2021-09-01 DIAGNOSIS — C775 Secondary and unspecified malignant neoplasm of intrapelvic lymph nodes: Secondary | ICD-10-CM | POA: Diagnosis not present

## 2021-10-26 ENCOUNTER — Other Ambulatory Visit: Payer: Self-pay | Admitting: Physician Assistant

## 2021-10-26 DIAGNOSIS — E119 Type 2 diabetes mellitus without complications: Secondary | ICD-10-CM

## 2021-10-30 ENCOUNTER — Encounter: Payer: Medicare Other | Admitting: Physician Assistant

## 2021-11-14 ENCOUNTER — Other Ambulatory Visit: Payer: Self-pay | Admitting: Physician Assistant

## 2021-11-14 DIAGNOSIS — E119 Type 2 diabetes mellitus without complications: Secondary | ICD-10-CM

## 2021-11-14 DIAGNOSIS — N183 Chronic kidney disease, stage 3 unspecified: Secondary | ICD-10-CM

## 2021-11-14 DIAGNOSIS — E782 Mixed hyperlipidemia: Secondary | ICD-10-CM

## 2021-11-20 ENCOUNTER — Ambulatory Visit (INDEPENDENT_AMBULATORY_CARE_PROVIDER_SITE_OTHER): Payer: Medicare Other | Admitting: Physician Assistant

## 2021-11-20 ENCOUNTER — Encounter: Payer: Self-pay | Admitting: Physician Assistant

## 2021-11-20 VITALS — BP 128/67 | HR 82 | Temp 97.7°F | Ht 70.0 in | Wt 179.0 lb

## 2021-11-20 DIAGNOSIS — E782 Mixed hyperlipidemia: Secondary | ICD-10-CM

## 2021-11-20 DIAGNOSIS — E1169 Type 2 diabetes mellitus with other specified complication: Secondary | ICD-10-CM

## 2021-11-20 DIAGNOSIS — N183 Chronic kidney disease, stage 3 unspecified: Secondary | ICD-10-CM | POA: Diagnosis not present

## 2021-11-20 DIAGNOSIS — E1159 Type 2 diabetes mellitus with other circulatory complications: Secondary | ICD-10-CM | POA: Diagnosis not present

## 2021-11-20 DIAGNOSIS — J309 Allergic rhinitis, unspecified: Secondary | ICD-10-CM | POA: Diagnosis not present

## 2021-11-20 DIAGNOSIS — Z0000 Encounter for general adult medical examination without abnormal findings: Secondary | ICD-10-CM | POA: Diagnosis not present

## 2021-11-20 DIAGNOSIS — E119 Type 2 diabetes mellitus without complications: Secondary | ICD-10-CM

## 2021-11-20 DIAGNOSIS — E559 Vitamin D deficiency, unspecified: Secondary | ICD-10-CM | POA: Diagnosis not present

## 2021-11-20 DIAGNOSIS — J3089 Other allergic rhinitis: Secondary | ICD-10-CM | POA: Diagnosis not present

## 2021-11-20 DIAGNOSIS — K219 Gastro-esophageal reflux disease without esophagitis: Secondary | ICD-10-CM

## 2021-11-20 DIAGNOSIS — R972 Elevated prostate specific antigen [PSA]: Secondary | ICD-10-CM

## 2021-11-20 DIAGNOSIS — I152 Hypertension secondary to endocrine disorders: Secondary | ICD-10-CM

## 2021-11-20 MED ORDER — LISINOPRIL 2.5 MG PO TABS: 2.5000 mg | ORAL_TABLET | Freq: Every day | ORAL | 0 refills | Status: DC

## 2021-11-20 MED ORDER — OMEPRAZOLE 20 MG PO CPDR: 20.0000 mg | DELAYED_RELEASE_CAPSULE | Freq: Every day | ORAL | 0 refills | Status: DC

## 2021-11-20 MED ORDER — FLUTICASONE PROPIONATE 50 MCG/ACT NA SUSP: 1.0000 | Freq: Every day | NASAL | 0 refills | Status: DC

## 2021-11-20 MED ORDER — METFORMIN HCL 500 MG PO TABS: 500.0000 mg | ORAL_TABLET | Freq: Every day | ORAL | 0 refills | Status: DC

## 2021-11-20 MED ORDER — LOVASTATIN 40 MG PO TABS: 40.0000 mg | ORAL_TABLET | Freq: Every day | ORAL | 0 refills | Status: DC

## 2021-11-20 NOTE — Progress Notes (Signed)
Subjective:   Alan Hurst is a 76 y.o. male who presents for Medicare Annual/Subsequent preventive examination.  Review of Systems    Refer to PCP  I connected with  [name] on 11/20/21 by an Louann and verified that I am speaking with the correct person using two identifiers.    I discussed the limitations, risks, security and privacy concerns of performing an evaluation and management service by telephone and the availability of in person appointments. I also discussed with the patient that there may be a patient responsible charge related to this service. The patient expressed understanding and verbally consented to this telephonic visit.  Location of Patient: Office Location of Provider:Office  List any persons and their role that are participating in the visit with the patient.    Dashel Goines, CMA     Objective:    Today's Vitals   11/20/21 0932  BP: 128/67  Pulse: 82  Temp: 97.7 F (36.5 C)  SpO2: 97%  Weight: 179 lb (81.2 kg)  Height: 5\' 10"  (1.778 m)   Body mass index is 25.68 kg/m.     10/26/2016    9:33 AM 08/05/2016    4:00 AM 08/04/2016   10:57 PM 08/04/2016    1:15 PM 12/05/2015    1:30 PM 06/28/2015    1:35 PM  Advanced Directives  Does Patient Have a Medical Advance Directive? No No No No Yes No  Type of Advance Directive     Living will;Healthcare Power of Attorney   Would patient like information on creating a medical advance directive? No - Patient declined No - Patient declined No - Patient declined       Current Medications (verified) Outpatient Encounter Medications as of 11/20/2021  Medication Sig   abiraterone acetate (ZYTIGA) 250 MG tablet Take by mouth.   aspirin EC 81 MG tablet Take 81 mg by mouth daily.   Cholecalciferol (VITAMIN D) 2000 units tablet Take 1 tablet by mouth daily.   fluticasone (FLONASE) 50 MCG/ACT nasal spray USE 1 SPRAY IN EACH NOSTRIL TWICE DAILY AFTER SINUS RINSE   lisinopril (ZESTRIL) 2.5 MG  tablet TAKE 1 TABLET BY MOUTH DAILY   lovastatin (MEVACOR) 40 MG tablet TAKE 1 TABLET BY MOUTH AT  BEDTIME   magnesium oxide (MAG-OX) 400 MG tablet Take 400 mg by mouth daily.   Melatonin 1 MG TABS Take 5 mg by mouth at bedtime.    metFORMIN (GLUCOPHAGE) 500 MG tablet TAKE 1 TABLET BY MOUTH DAILY   NON FORMULARY Prostate beta plus- 250 mg daily   Omega-3 Fatty Acids (FISH OIL) 1000 MG CAPS Take 1 capsule by mouth daily.   omeprazole (PRILOSEC) 20 MG capsule TAKE 1 CAPSULE BY MOUTH DAILY   predniSONE (DELTASONE) 5 MG tablet Take 5 mg by mouth daily.   No facility-administered encounter medications on file as of 11/20/2021.    Allergies (verified) No known allergies and Lemon oil   History: Past Medical History:  Diagnosis Date   Diabetes ([place])    Femur fracture, right ([place])    GERD (gastroesophageal reflux disease) 12/05/2015   Well controlled on meds   H/O: substance abuse ([place]) 1999   Hip fracture ([place])    HLD (hyperlipidemia) 12/05/2015   10+ yrs at least.    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   [facility]   Sleep disorder 12/05/2015   Vitamin B deficiency 12/05/2015   Vitamin D insufficiency 12/05/2015   Past Surgical History:  Procedure Laterality Date   CATARACT EXTRACTION, BILATERAL  10/2016   COLONOSCOPY     FEMUR FRACTURE SURGERY     HIP FRACTURE SURGERY     JOINT REPLACEMENT Bilateral    hips   TIBIA FRACTURE SURGERY     TOTAL HIP REVISION Right 08/07/2016   Procedure: RIGHT TOTAL HIP REVISION;  Surgeon: Rod Can, MD;  Location: [facility];  Service: Orthopedics;  Laterality: Right;   Family History  Problem Relation Age of Onset   Alcohol abuse Mother    Alcohol abuse Father    Congestive Heart Failure Father    Kidney disease Father    Colon cancer Neg Hx    Esophageal cancer Neg Hx    Rectal cancer Neg Hx    Stomach cancer Neg Hx    Social History   Socioeconomic History   Marital status: Married    Spouse name: Not on file   Number  of children: Not on file   Years of education: Not on file   Highest education level: Not on file  Occupational History   Not on file  Tobacco Use   Smoking status: Former    Packs/day: 1.50    Years: 32.00    Total pack years: 48.00    Types: Cigarettes    Start date: 05/11/1961    Quit date: 05/11/1993    Years since quitting: 28.5   Smokeless tobacco: Never  Vaping Use   Vaping Use: Never used  Substance and Sexual Activity   Alcohol use: No   Drug use: No   Sexual activity: Yes    Birth control/protection: None  Other Topics Concern   Not on file  Social History Narrative   Not on file   Social Determinants of Health   Financial Resource Strain: Not on file  Food Insecurity: Not on file  Transportation Needs: Not on file  Physical Activity: Not on file  Stress: Not on file  Social Connections: Not on file    Tobacco Counseling Counseling given: Not Answered   Clinical Intake:                 Diabetic?Yes         Activities of Daily Living    11/20/2021    9:36 AM 07/01/2021    8:43 AM  In your present state of health, do you have any difficulty performing the following activities:  Hearing? 1 1  Vision? 0 0  Difficulty concentrating or making decisions? 0 0  Walking or climbing stairs? 0 0  Dressing or bathing? 0 0  Doing errands, shopping? 0 0    Patient Care Team: Lorrene Reid, PA-C as PCP - General Christain [place], OD as Referring Physician (Optometry) Lavonna Monarch, MD as Consulting Physician (Dermatology)  Indicate any recent Medical Services you may have received from other than [facility] providers in the past year (date may be approximate).     Assessment:   This is a routine wellness examination for Alan Hurst.  Hearing/Vision screen No results found.  Dietary issues and exercise activities discussed:     Goals Addressed   None   Depression Screen    11/20/2021    9:36 AM 07/01/2021    8:44 AM 02/24/2021    9:46 AM  10/24/2020   10:15 AM 06/24/2020   10:40 AM 01/22/2020    3:03 PM 05/31/2019    8:09 AM  PHQ 2/9 Scores  PHQ - 2 Score 0 0 1 1 0 0 0  PHQ- 9 Score 1 2 3 2 1 1 1     Fall Risk    11/20/2021    9:36 AM 07/01/2021    8:43 AM 02/24/2021    9:45 AM 10/24/2020   10:12 AM 06/24/2020   10:39 AM  Fall Risk   Falls in the past year? 0 0 0 0 0  Number falls in past yr: 0 0 0 0   Injury with Fall? 0 0 0 0   Risk for fall due to : No Fall Risks No Fall Risks No Fall Risks No Fall Risks   Follow up Falls evaluation completed Falls evaluation completed Falls evaluation completed Falls evaluation completed Falls evaluation completed    [place]:  Any stairs in or around the home? Yes  If so, are there any without handrails? Yes  Home free of loose throw rugs in walkways, pet beds, electrical cords, etc? Yes  Adequate lighting in your home to reduce risk of falls? Yes   ASSISTIVE DEVICES UTILIZED TO PREVENT FALLS:  Life alert? No  Use of a cane, walker or w/c? No  Grab bars in the bathroom? Yes  Shower chair or bench in shower? Yes  Elevated toilet seat or a handicapped toilet? Yes   TIMED UP AND GO:  Was the test performed? Yes .  Length of time to ambulate 10 feet: 9  sec.   Gait steady and fast without use of assistive device  Cognitive Function:        10/24/2020   10:00 AM 05/31/2019    8:10 AM 03/17/2018   11:17 AM  6CIT Screen  What Year? 0 points 0 points 0 points  What month? 0 points 0 points 0 points  What time? 0 points 0 points 0 points  Count back from 20 0 points 0 points 0 points  Months in reverse 0 points 0 points 0 points  Repeat phrase 0 points 0 points 0 points  Total Score 0 points 0 points 0 points    Immunizations Immunization History  Administered Date(s) Administered   Fluad Quad(high Dose 65+) 02/16/2019, 02/24/2021   Influenza Split 05/20/2012   Influenza, High Dose Seasonal PF 03/31/2013, 05/10/2015, 03/02/2016,  03/01/2017, 02/15/2018   PFIZER(Purple Top)SARS-COV-2 Vaccination 06/16/2019, 07/07/2019, 04/26/2020   Pneumococcal Conjugate-13 05/10/2015, 03/02/2016   Pneumococcal Polysaccharide-23 03/31/2013   Tdap 03/25/2017    TDAP status: Up to date  Flu Vaccine status: Up to date  Pneumococcal vaccine status: Up to date  Covid-19 vaccine status: Completed vaccines  Qualifies for Shingles Vaccine? Yes   Zostavax completed No   Shingrix Completed?: No.    Education has been provided regarding the importance of this vaccine. Patient has been advised to call insurance company to determine out of pocket expense if they have not yet received this vaccine. Advised may also receive vaccine at local pharmacy or Health Dept. Verbalized acceptance and understanding.  Screening Tests Health Maintenance  Topic Date Due   Zoster Vaccines- Shingrix (1 of 2) Never done   COVID-19 Vaccine (4 - Booster for Pfizer series) 06/21/2020   FOOT EXAM  10/24/2021   Hepatitis C Screening  10/26/2028 (Originally 02/15/1964)   INFLUENZA VACCINE  12/09/2021   HEMOGLOBIN A1C  12/29/2021   OPHTHALMOLOGY EXAM  01/07/2022   COLONOSCOPY (Pts 45-70yrs Insurance coverage will need to be confirmed)  06/19/2022   TETANUS/TDAP  03/26/2027   Pneumonia Vaccine 59+ Years old  Completed   HPV VACCINES  Aged Out  Health Maintenance  Health Maintenance Due  Topic Date Due   Zoster Vaccines- Shingrix (1 of 2) Never done   COVID-19 Vaccine (4 - Booster for Pfizer series) 06/21/2020   FOOT EXAM  10/24/2021    Colorectal cancer screening: Type of screening: Colonoscopy. Completed 2021. Repeat every 3 years  Lung Cancer Screening: (Low Dose CT Chest recommended if Age 76-80 years, 30 pack-year currently smoking OR have quit w/in 15years.) does not qualify.   Lung Cancer Screening Referral:  Additional Screening:  Hepatitis C Screening: does qualify; Patient declined  Vision Screening: Recommended annual ophthalmology  exams for early detection of glaucoma and other disorders of the eye. Is the patient up to date with their annual eye exam?  Yes  Who is the provider or what is the name of the office in which the patient attends annual eye exams? [facility] If pt is not established with a provider, would they like to be referred to a provider to establish care? No .   Dental Screening: Recommended annual dental exams for proper oral hygiene  Community Resource Referral / Chronic Care Management: CRR required this visit?  No   CCM required this visit?  No      Plan:     I have personally reviewed and noted the following in the patient's chart:   Medical and social history Use of alcohol, tobacco or illicit drugs  Current medications and supplements including opioid prescriptions. Patient is not currently taking opioid prescriptions. Functional ability and status Nutritional status Physical activity Advanced directives List of other physicians Hospitalizations, surgeries, and ER visits in previous 12 months Vitals Screenings to include cognitive, depression, and falls Referrals and appointments  In addition, I have reviewed and discussed with patient certain preventive protocols, quality metrics, and best practice recommendations. A written personalized care plan for preventive services as well as general preventive health recommendations were provided to patient.     [place]   11/20/2021   Nurse Notes: Face to Face 20 minutes   Mr. Alan Hurst , Thank you for taking time to come for your Medicare Wellness Visit. I appreciate your ongoing commitment to your health goals. Please review the following plan we discussed and let me know if I can assist you in the future.   These are the goals we discussed:  Goals   None    SHINGRIX This is a list of the screening recommended for you and due dates:  Health Maintenance  Topic Date Due   Zoster (Shingles) Vaccine (1 of 2)  Never done   COVID-19 Vaccine (4 - Booster for Pfizer series) 06/21/2020   Complete foot exam   10/24/2021   Hepatitis C Screening: USPSTF Recommendation to screen - Ages 18-79 yo.  10/26/2028*   Flu Shot  12/09/2021   Hemoglobin A1C  12/29/2021   Eye exam for diabetics  01/07/2022   Colon Cancer Screening  06/19/2022   Tetanus Vaccine  03/26/2027   Pneumonia Vaccine  Completed   HPV Vaccine  Aged Out  *Topic was postponed. The date shown is not the original due date.

## 2021-11-20 NOTE — Patient Instructions (Signed)
Preventive Care 65 Years and Older, Male Preventive care refers to lifestyle choices and visits with your health care provider that can promote health and wellness. Preventive care visits are also called wellness exams. What can I expect for my preventive care visit? Counseling During your preventive care visit, your health care provider may ask about your: Medical history, including: Past medical problems. Family medical history. History of falls. Current health, including: Emotional well-being. Home life and relationship well-being. Sexual activity. Memory and ability to understand (cognition). Lifestyle, including: Alcohol, nicotine or tobacco, and drug use. Access to firearms. Diet, exercise, and sleep habits. Work and work environment. Sunscreen use. Safety issues such as seatbelt and bike helmet use. Physical exam Your health care provider will check your: Height and weight. These may be used to calculate your BMI (body mass index). BMI is a measurement that tells if you are at a healthy weight. Waist circumference. This measures the distance around your waistline. This measurement also tells if you are at a healthy weight and may help predict your risk of certain diseases, such as type 2 diabetes and high blood pressure. Heart rate and blood pressure. Body temperature. Skin for abnormal spots. What immunizations do I need?  Vaccines are usually given at various ages, according to a schedule. Your health care provider will recommend vaccines for you based on your age, medical history, and lifestyle or other factors, such as travel or where you work. What tests do I need? Screening Your health care provider may recommend screening tests for certain conditions. This may include: Lipid and cholesterol levels. Diabetes screening. This is done by checking your blood sugar (glucose) after you have not eaten for a while (fasting). Hepatitis C test. Hepatitis B test. HIV (human  immunodeficiency virus) test. STI (sexually transmitted infection) testing, if you are at risk. Lung cancer screening. Colorectal cancer screening. Prostate cancer screening. Abdominal aortic aneurysm (AAA) screening. You may need this if you are a current or former smoker. Talk with your health care provider about your test results, treatment options, and if necessary, the need for more tests. Follow these instructions at home: Eating and drinking  Eat a diet that includes fresh fruits and vegetables, whole grains, lean protein, and low-fat dairy products. Limit your intake of foods with high amounts of sugar, saturated fats, and salt. Take vitamin and mineral supplements as recommended by your health care provider. Do not drink alcohol if your health care provider tells you not to drink. If you drink alcohol: Limit how much you have to 0-2 drinks a day. Know how much alcohol is in your drink. In the [place] one drink equals one 12 oz bottle of beer (355 mL), one 5 oz glass of wine (148 mL), or one 1 oz glass of hard liquor (44 mL). Lifestyle Brush your teeth every morning and night with fluoride toothpaste. Floss one time each day. Exercise for at least 30 minutes 5 or more days each week. Do not use any products that contain nicotine or tobacco. These products include cigarettes, chewing tobacco, and vaping devices, such as e-cigarettes. If you need help quitting, ask your health care provider. Do not use drugs. If you are sexually active, practice safe sex. Use a condom or other form of protection to prevent STIs. Take aspirin only as told by your health care provider. Make sure that you understand how much to take and what form to take. Work with your health care provider to find out whether it is safe   and beneficial for you to take aspirin daily. Ask your health care provider if you need to take a cholesterol-lowering medicine (statin). Find healthy ways to manage stress, such  as: Meditation, yoga, or listening to music. Journaling. Talking to a trusted person. Spending time with friends and family. Safety Always wear your seat belt while driving or riding in a vehicle. Do not drive: If you have been drinking alcohol. Do not ride with someone who has been drinking. When you are tired or distracted. While texting. If you have been using any mind-altering substances or drugs. Wear a helmet and other protective equipment during sports activities. If you have firearms in your house, make sure you follow all gun safety procedures. Minimize exposure to UV radiation to reduce your risk of skin cancer. What's next? Visit your health care provider once a year for an annual wellness visit. Ask your health care provider how often you should have your eyes and teeth checked. Stay up to date on all vaccines. This information is not intended to replace advice given to you by your health care provider. Make sure you discuss any questions you have with your health care provider. Document Revised: 10/23/2020 Document Reviewed: 10/23/2020 Elsevier Patient Education  2023 Elsevier Inc.  

## 2021-11-21 LAB — LIPID PANEL
Chol/HDL Ratio: 2.6 ratio (ref 0.0–5.0)
Cholesterol, Total: 170 mg/dL (ref 100–199)
HDL: 66 mg/dL (ref >39)
LDL Chol Calc (NIH): 85 mg/dL (ref 0–99)
Triglycerides: 104 mg/dL (ref 0–149)
VLDL Cholesterol Cal: 19 mg/dL (ref 5–40)

## 2021-11-21 LAB — COMPREHENSIVE METABOLIC PANEL
ALT: 15 IU/L (ref 0–44)
AST: 20 IU/L (ref 0–40)
Albumin/Globulin Ratio: 1.7 (ref 1.2–2.2)
Albumin: 4.4 g/dL (ref 3.8–4.8)
Alkaline Phosphatase: 80 IU/L (ref 44–121)
BUN/Creatinine Ratio: 18 (ref 10–24)
BUN: 20 mg/dL (ref 8–27)
Bilirubin Total: 0.4 mg/dL (ref 0.0–1.2)
CO2: 21 mmol/L (ref 20–29)
Calcium: 10.1 mg/dL (ref 8.6–10.2)
Chloride: 105 mmol/L (ref 96–106)
Creatinine, Ser: 1.09 mg/dL (ref 0.76–1.27)
Globulin, Total: 2.6 g/dL (ref 1.5–4.5)
Glucose: 126 mg/dL — ABNORMAL HIGH (ref 70–99)
Potassium: 5.2 mmol/L (ref 3.5–5.2)
Sodium: 143 mmol/L (ref 134–144)
Total Protein: 7 g/dL (ref 6.0–8.5)
eGFR: 71 mL/min/{1.73_m2} (ref >59)

## 2021-11-21 LAB — HEMOGLOBIN A1C
Est. average glucose Bld gHb Est-mCnc: 137 mg/dL
Hgb A1c MFr Bld: 6.4 % — ABNORMAL HIGH (ref 4.8–5.6)

## 2021-11-21 LAB — CBC
Hematocrit: 39 % (ref 37.5–51.0)
Hemoglobin: 12.6 g/dL — ABNORMAL LOW (ref 13.0–17.7)
MCH: 27.3 pg (ref 26.6–33.0)
MCHC: 32.3 g/dL (ref 31.5–35.7)
MCV: 84 fL (ref 79–97)
Platelets: 286 10*3/uL (ref 150–450)
RBC: 4.62 x10E6/uL (ref 4.14–5.80)
RDW: 13.5 % (ref 11.6–15.4)
WBC: 6 10*3/uL (ref 3.4–10.8)

## 2021-11-21 LAB — SPECIMEN STATUS REPORT

## 2021-11-21 LAB — PSA: Prostate Specific Ag, Serum: <0.1 ng/mL (ref 0.0–4.0)

## 2021-11-21 LAB — VITAMIN D 25 HYDROXY (VIT D DEFICIENCY, FRACTURES): Vit D, 25-Hydroxy: 62.8 ng/mL (ref 30.0–100.0)

## 2021-11-21 LAB — TSH: TSH: 0.848 u[IU]/mL (ref 0.450–4.500)

## 2021-11-22 LAB — MICROALBUMIN / CREATININE URINE RATIO
Creatinine, Urine: 108.4 mg/dL
Microalb/Creat Ratio: 12 mg/g creat (ref 0–29)
Microalbumin, Urine: 13.4 ug/mL

## 2021-11-22 LAB — SPECIMEN STATUS REPORT

## 2022-01-01 DIAGNOSIS — C775 Secondary and unspecified malignant neoplasm of intrapelvic lymph nodes: Secondary | ICD-10-CM | POA: Diagnosis not present

## 2022-01-01 DIAGNOSIS — E876 Hypokalemia: Secondary | ICD-10-CM | POA: Diagnosis not present

## 2022-01-01 DIAGNOSIS — R200 Anesthesia of skin: Secondary | ICD-10-CM | POA: Diagnosis not present

## 2022-01-01 DIAGNOSIS — C3411 Malignant neoplasm of upper lobe, right bronchus or lung: Secondary | ICD-10-CM | POA: Diagnosis not present

## 2022-01-01 DIAGNOSIS — Z85118 Personal history of other malignant neoplasm of bronchus and lung: Secondary | ICD-10-CM | POA: Diagnosis not present

## 2022-01-01 DIAGNOSIS — D649 Anemia, unspecified: Secondary | ICD-10-CM | POA: Diagnosis not present

## 2022-01-23 ENCOUNTER — Other Ambulatory Visit: Payer: Self-pay | Admitting: Physician Assistant

## 2022-01-23 DIAGNOSIS — E782 Mixed hyperlipidemia: Secondary | ICD-10-CM

## 2022-01-23 DIAGNOSIS — E119 Type 2 diabetes mellitus without complications: Secondary | ICD-10-CM

## 2022-01-23 DIAGNOSIS — N183 Chronic kidney disease, stage 3 unspecified: Secondary | ICD-10-CM

## 2022-01-28 DIAGNOSIS — Z923 Personal history of irradiation: Secondary | ICD-10-CM | POA: Diagnosis not present

## 2022-01-28 DIAGNOSIS — C3411 Malignant neoplasm of upper lobe, right bronchus or lung: Secondary | ICD-10-CM | POA: Diagnosis not present

## 2022-01-28 DIAGNOSIS — R059 Cough, unspecified: Secondary | ICD-10-CM | POA: Diagnosis not present

## 2022-02-06 DIAGNOSIS — Z923 Personal history of irradiation: Secondary | ICD-10-CM | POA: Diagnosis not present

## 2022-02-06 DIAGNOSIS — C3491 Malignant neoplasm of unspecified part of right bronchus or lung: Secondary | ICD-10-CM | POA: Diagnosis not present

## 2022-02-06 DIAGNOSIS — R918 Other nonspecific abnormal finding of lung field: Secondary | ICD-10-CM | POA: Diagnosis not present

## 2022-02-06 DIAGNOSIS — J9811 Atelectasis: Secondary | ICD-10-CM | POA: Diagnosis not present

## 2022-02-06 DIAGNOSIS — R937 Abnormal findings on diagnostic imaging of other parts of musculoskeletal system: Secondary | ICD-10-CM | POA: Diagnosis not present

## 2022-02-06 DIAGNOSIS — Z96641 Presence of right artificial hip joint: Secondary | ICD-10-CM | POA: Diagnosis not present

## 2022-02-06 DIAGNOSIS — E1169 Type 2 diabetes mellitus with other specified complication: Secondary | ICD-10-CM | POA: Diagnosis not present

## 2022-02-16 DIAGNOSIS — Z7952 Long term (current) use of systemic steroids: Secondary | ICD-10-CM | POA: Diagnosis not present

## 2022-02-16 DIAGNOSIS — Z923 Personal history of irradiation: Secondary | ICD-10-CM | POA: Diagnosis not present

## 2022-02-16 DIAGNOSIS — Z191 Hormone sensitive malignancy status: Secondary | ICD-10-CM | POA: Diagnosis not present

## 2022-02-16 DIAGNOSIS — E876 Hypokalemia: Secondary | ICD-10-CM | POA: Diagnosis not present

## 2022-02-16 DIAGNOSIS — D630 Anemia in neoplastic disease: Secondary | ICD-10-CM | POA: Diagnosis not present

## 2022-02-16 DIAGNOSIS — C775 Secondary and unspecified malignant neoplasm of intrapelvic lymph nodes: Secondary | ICD-10-CM | POA: Diagnosis not present

## 2022-02-16 DIAGNOSIS — C3411 Malignant neoplasm of upper lobe, right bronchus or lung: Secondary | ICD-10-CM | POA: Diagnosis not present

## 2022-02-16 DIAGNOSIS — C786 Secondary malignant neoplasm of retroperitoneum and peritoneum: Secondary | ICD-10-CM | POA: Diagnosis not present

## 2022-02-16 DIAGNOSIS — R059 Cough, unspecified: Secondary | ICD-10-CM | POA: Diagnosis not present

## 2022-02-16 DIAGNOSIS — R200 Anesthesia of skin: Secondary | ICD-10-CM | POA: Diagnosis not present

## 2022-02-18 ENCOUNTER — Other Ambulatory Visit: Payer: Self-pay | Admitting: Physician Assistant

## 2022-02-18 DIAGNOSIS — K219 Gastro-esophageal reflux disease without esophagitis: Secondary | ICD-10-CM

## 2022-02-24 ENCOUNTER — Ambulatory Visit (INDEPENDENT_AMBULATORY_CARE_PROVIDER_SITE_OTHER): Payer: Medicare Other | Admitting: Physician Assistant

## 2022-02-24 ENCOUNTER — Encounter: Payer: Self-pay | Admitting: Physician Assistant

## 2022-02-24 VITALS — BP 127/73 | HR 89 | Temp 97.7°F | Ht 69.0 in | Wt 180.0 lb

## 2022-02-24 DIAGNOSIS — L608 Other nail disorders: Secondary | ICD-10-CM | POA: Diagnosis not present

## 2022-02-24 DIAGNOSIS — D649 Anemia, unspecified: Secondary | ICD-10-CM

## 2022-02-24 NOTE — Patient Instructions (Signed)

## 2022-02-24 NOTE — Progress Notes (Signed)
  Established patient acute visit   Patient: Alan Hurst   DOB: [birth year]   76 y.o. Male  MRN: [id number] Visit Date: 02/24/2022  Chief Complaint  Patient presents with   Acute Visit    Fungus on finger   Subjective    HPI HPI     Acute Visit    Additional comments: Fungus on finger      Last edited by Adelfa Koh, CMA on 02/24/2022  3:22 PM.      Patient presents with concerns of fungal infection of right index finger. Finger is losing nail from the upper edge and splitting near corner. Patient reports has not started new medications, changed soaps or detergents. Finger does not hurt. No nail discoloration. Continues with B12 supplement.     Medications: Outpatient Medications Prior to Visit  Medication Sig   abiraterone acetate (ZYTIGA) 250 MG tablet Take by mouth.   aspirin EC 81 MG tablet Take 81 mg by mouth daily.   Cholecalciferol (VITAMIN D) 2000 units tablet Take 1 tablet by mouth daily.   fluticasone (FLONASE) 50 MCG/ACT nasal spray Place 1 spray into both nostrils daily.   lisinopril (ZESTRIL) 2.5 MG tablet TAKE 1 TABLET BY MOUTH DAILY   lovastatin (MEVACOR) 40 MG tablet TAKE 1 TABLET BY MOUTH AT  BEDTIME   magnesium oxide (MAG-OX) 400 MG tablet Take 400 mg by mouth daily.   Melatonin 1 MG TABS Take 5 mg by mouth at bedtime.    metFORMIN (GLUCOPHAGE) 500 MG tablet Take 1 tablet (500 mg total) by mouth daily.   Omega-3 Fatty Acids (FISH OIL) 1000 MG CAPS Take 1 capsule by mouth daily.   omeprazole (PRILOSEC) 20 MG capsule TAKE 1 CAPSULE BY MOUTH DAILY   predniSONE (DELTASONE) 5 MG tablet Take 5 mg by mouth daily.   No facility-administered medications prior to visit.    Review of Systems Review of Systems:  A fourteen system review of systems was performed and found to be positive as per HPI.     Objective    BP 127/73   Pulse 89   Temp 97.7 F (36.5 C) (Temporal)   Ht 5\' 9"  (1.753 m)   Wt 180 lb (81.6 kg)   SpO2 98%   BMI 26.58 kg/m     Physical Exam  General:  Well Developed, well nourished, in no acute distress. Neuro:  Alert and oriented,  extra-ocular muscles intact  HEENT:  Normocephalic, atraumatic, neck supple  Skin:  no nail discoloration of right index, thinning of nail noted Cardiac:  RRR, S1 S2 Respiratory: CTA B/L  Vascular:  Ext warm, no cyanosis apprec.; cap RF less 2 sec. Psych:  No HI/SI, judgement and insight good, Euthymic mood. Full Affect.   No results found for any visits on 02/24/22.  Assessment & Plan     Discussed with patient various etiologies for brittle nails. Patient's recent CBC shows mildly low hemoglobin and hematocrit. Recommend further evaluation of anemia and will collect iron panel and B12/folate. Anemia can contribute to nail health. If labs unremarkable, then we can consider referral to dermatology or trial topical anti-fungal medication. Patient has history of prostate cancer with metastasis undergoing treatment with Lupron so suspect also contributing to symptoms.   Return if symptoms worsen or fail to improve/pending labs.        Lorrene Reid, PA-C  [facility] at [facility] [phone] (phone) [phone] (fax)  [place]

## 2022-02-25 ENCOUNTER — Other Ambulatory Visit: Payer: Medicare Other

## 2022-02-25 DIAGNOSIS — D649 Anemia, unspecified: Secondary | ICD-10-CM | POA: Diagnosis not present

## 2022-02-26 LAB — IRON,TIBC AND FERRITIN PANEL
Ferritin: 154 ng/mL (ref 30–400)
Iron Saturation: 26 % (ref 15–55)
Iron: 89 ug/dL (ref 38–169)
Total Iron Binding Capacity: 349 ug/dL (ref 250–450)
UIBC: 260 ug/dL (ref 111–343)

## 2022-02-26 LAB — CBC WITH DIFFERENTIAL/PLATELET
Basophils Absolute: 0 10*3/uL (ref 0.0–0.2)
Basos: 1 %
EOS (ABSOLUTE): 0.2 10*3/uL (ref 0.0–0.4)
Eos: 4 %
Hematocrit: 38.2 % (ref 37.5–51.0)
Hemoglobin: 12.2 g/dL — ABNORMAL LOW (ref 13.0–17.7)
Immature Grans (Abs): 0 10*3/uL (ref 0.0–0.1)
Immature Granulocytes: 0 %
Lymphocytes Absolute: 2.1 10*3/uL (ref 0.7–3.1)
Lymphs: 36 %
MCH: 27.2 pg (ref 26.6–33.0)
MCHC: 31.9 g/dL (ref 31.5–35.7)
MCV: 85 fL (ref 79–97)
Monocytes Absolute: 0.5 10*3/uL (ref 0.1–0.9)
Monocytes: 9 %
Neutrophils Absolute: 3 10*3/uL (ref 1.4–7.0)
Neutrophils: 50 %
Platelets: 295 10*3/uL (ref 150–450)
RBC: 4.49 x10E6/uL (ref 4.14–5.80)
RDW: 13.6 % (ref 11.6–15.4)
WBC: 5.8 10*3/uL (ref 3.4–10.8)

## 2022-02-26 LAB — B12 AND FOLATE PANEL
Folate: 18.8 ng/mL (ref >3.0)
Vitamin B-12: 1521 pg/mL — ABNORMAL HIGH (ref 232–1245)

## 2022-03-03 ENCOUNTER — Other Ambulatory Visit: Payer: Self-pay | Admitting: Physician Assistant

## 2022-03-03 DIAGNOSIS — J449 Chronic obstructive pulmonary disease, unspecified: Secondary | ICD-10-CM | POA: Diagnosis not present

## 2022-03-03 DIAGNOSIS — B351 Tinea unguium: Secondary | ICD-10-CM

## 2022-03-03 DIAGNOSIS — C3411 Malignant neoplasm of upper lobe, right bronchus or lung: Secondary | ICD-10-CM | POA: Diagnosis not present

## 2022-03-03 DIAGNOSIS — J700 Acute pulmonary manifestations due to radiation: Secondary | ICD-10-CM | POA: Diagnosis not present

## 2022-03-03 DIAGNOSIS — C3492 Malignant neoplasm of unspecified part of left bronchus or lung: Secondary | ICD-10-CM | POA: Diagnosis not present

## 2022-03-03 MED ORDER — CICLOPIROX 8 % EX SOLN: Freq: Every day | CUTANEOUS | 0 refills | Status: DC

## 2022-03-11 DIAGNOSIS — R6889 Other general symptoms and signs: Secondary | ICD-10-CM | POA: Diagnosis not present

## 2022-03-11 DIAGNOSIS — R202 Paresthesia of skin: Secondary | ICD-10-CM | POA: Diagnosis not present

## 2022-04-21 ENCOUNTER — Encounter: Payer: Self-pay | Admitting: Emergency Medicine

## 2022-04-21 ENCOUNTER — Ambulatory Visit
Admission: EM | Admit: 2022-04-21 | Discharge: 2022-04-21 | Disposition: A | Discharge status: Home / Self Care | Payer: Medicare Other | Attending: Internal Medicine | Admitting: Internal Medicine

## 2022-04-21 ENCOUNTER — Ambulatory Visit (INDEPENDENT_AMBULATORY_CARE_PROVIDER_SITE_OTHER): Payer: Medicare Other

## 2022-04-21 DIAGNOSIS — J069 Acute upper respiratory infection, unspecified: Secondary | ICD-10-CM

## 2022-04-21 DIAGNOSIS — R062 Wheezing: Secondary | ICD-10-CM | POA: Diagnosis not present

## 2022-04-21 DIAGNOSIS — R059 Cough, unspecified: Secondary | ICD-10-CM

## 2022-04-21 DIAGNOSIS — C3490 Malignant neoplasm of unspecified part of unspecified bronchus or lung: Secondary | ICD-10-CM | POA: Diagnosis not present

## 2022-04-21 MED ORDER — QVAR REDIHALER 40 MCG/ACT IN AERB: 1.0000 | INHALATION_SPRAY | Freq: Two times a day (BID) | RESPIRATORY_TRACT | 0 refills | Status: DC

## 2022-04-21 MED ORDER — GUAIFENESIN 200 MG PO TABS: 200.0000 mg | ORAL_TABLET | ORAL | 0 refills | Status: DC | PRN

## 2022-04-21 NOTE — ED Triage Notes (Signed)
Pt is present today with c/o cough, wheezing, and congestion.   Onset~x6 days

## 2022-04-21 NOTE — ED Provider Notes (Signed)
[facility]    CSN: [id number] Arrival date & time: 04/21/22  1432      History   Chief Complaint Chief Complaint  Patient presents with   Cough    Chest congestion    HPI Alan Hurst is a 76 y.o. male.   Patient presents with 6-day history of cough and nasal congestion.  Patient is concerned given that he has been having some intermittent wheezing.  He reports history of COPD and has been using his albuterol inhaler with minimal improvement.  Reports intermittent shortness of breath associated with this as well.  Denies any known fevers or sick contacts.  Denies chest pain, sore throat, ear pain, nausea, vomiting, diarrhea, abdominal pain.  Patient has taken over-the-counter cough medication such as theraflu with minimal improvement of symptoms as well.  Patient also has elevated blood pressure reading but reports that he got a phone call prior to being pulled to the exam room that made him very angry so he is attributing symptoms to this.  Denies headache, blurred vision, dizziness, nausea, vomiting.  Patient reports history of lung cancer where he is in remission.  States he recently saw oncologist and "got a clean bill of health". Used albuterol inhaler prior to arrival to urgent care.    Cough   Past Medical History:  Diagnosis Date   Diabetes ([place])    Femur fracture, right ([place])    GERD (gastroesophageal reflux disease) 12/05/2015   Well controlled on meds   H/O: substance abuse ([place]) 1999   Hip fracture ([place])    HLD (hyperlipidemia) 12/05/2015   10+ yrs at least.    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[place]) 05/15/2021   Chronic obstructive pulmonary disease ([place]) 03/11/2021   Malignant neoplasm of upper lobe of right lung ([place]) 11/13/2020   Lung  nodule 09/26/2020   Prostate cancer, primary, with metastasis from prostate to other site [facility]) 06/19/2020   PSA elevation 04/03/2020   Environmental and seasonal allergies 06/14/2017   Bite, insect 04/05/2017   Health education/counseling 03/25/2017   Allergic rhinitis 03/25/2017   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, without long-term current use of insulin ([place]) 03/01/2017   Mixed diabetic hyperlipidemia associated with type 2 diabetes mellitus ([place]) 03/01/2017   Hypertension associated with diabetes ([place]) 03/01/2017   Cough in adult at night primarily when lying down 03/01/2017   Vitamin D deficiency 03/01/2017   Rash and nonspecific skin eruption 09/22/2016   Periprosthetic fracture of femur following total replacement of hip 08/07/2016   Fall    Essential hypertension    Femur fracture ([place]) 08/04/2016   Chronic kidney disease- stage 3a 01/02/2016   Type 2 diabetes mellitus without complication, without long-term current use of insulin ([place]) 12/05/2015   GERD (gastroesophageal reflux disease) 12/05/2015   Sleep disorder 12/05/2015   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   Vitamin D insufficiency 12/05/2015   Vitamin B deficiency 12/05/2015   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 Authorizing Provider  beclomethasone (QVAR REDIHALER) 40 MCG/ACT inhaler Inhale 1 puff into the lungs 2 (two)  times daily. 04/21/22  Yes Fidelis Loth, Hildred Alamin E, FNP  guaiFENesin 200 MG tablet Take 1 tablet (200 mg total) by mouth every 4 (four) hours as needed for cough or to loosen phlegm. 04/21/22  Yes Jearl Soto, Michele Rockers, FNP  abiraterone acetate (ZYTIGA) 250 MG tablet Take by mouth. 09/16/20   [provider]  aspirin EC 81 MG tablet Take 81 mg by mouth daily.    [provider]  Cholecalciferol (VITAMIN D) 2000 units tablet Take 1 tablet by mouth daily. 04/25/13   [provider]  ciclopirox (PENLAC) 8 % solution Apply topically at bedtime. Apply over nail and surrounding skin. Apply daily over previous coat. After seven (7) days, may remove with alcohol and continue cycle. 03/03/22   Abonza, Maritza, PA-C  fluticasone (FLONASE) 50 MCG/ACT nasal spray Place 1 spray into both nostrils daily. 11/20/21   Lorrene Reid, PA-C  lisinopril (ZESTRIL) 2.5 MG tablet TAKE 1 TABLET BY MOUTH DAILY 01/23/22   Ronnell Freshwater, NP  lovastatin (MEVACOR) 40 MG tablet TAKE 1 TABLET BY MOUTH AT  BEDTIME 01/23/22   Ronnell Freshwater, NP  magnesium oxide (MAG-OX) 400 MG tablet Take 400 mg by mouth daily.    [provider]  Melatonin 1 MG TABS Take 5 mg by mouth at bedtime.     [provider]  metFORMIN (GLUCOPHAGE) 500 MG tablet Take 1 tablet (500 mg total) by mouth daily. 11/20/21   Lorrene Reid, PA-C  Omega-3 Fatty Acids (FISH OIL) 1000 MG CAPS Take 1 capsule by mouth daily. 04/25/13   [provider]  omeprazole (PRILOSEC) 20 MG capsule TAKE 1 CAPSULE BY MOUTH DAILY 02/18/22   Abonza, Maritza, PA-C  predniSONE (DELTASONE) 5 MG tablet Take 5 mg by mouth daily. 09/16/20   [provider]    Family History Family History  Problem Relation Age of Onset   Alcohol abuse Mother    Alcohol abuse Father    Congestive Heart Failure Father    Kidney disease Father    Colon cancer Neg Hx    Esophageal cancer Neg Hx    Rectal cancer Neg Hx    Stomach cancer Neg Hx      Social History Social History   Tobacco Use   Smoking status: Former    Packs/day: 1.50    Years: 32.00    Total pack years: 48.00    Types: Cigarettes    Start date: 05/11/1961    Quit date: 05/11/1993    Years since quitting: 28.9   Smokeless tobacco: Never  Vaping Use   Vaping Use: Never used  Substance Use Topics   Alcohol use: No   Drug use: No     Allergies   No known allergies and Lemon oil   Review of Systems Review of Systems Per HPI  Physical Exam Triage Vital Signs ED Triage Vitals  Enc Vitals Group     BP 04/21/22 1654 (!) 182/87     Pulse Rate 04/21/22 1654 86  Resp 04/21/22 1654 17     Temp 04/21/22 1654 97.7 F (36.5 C)     Temp src --      SpO2 04/21/22 1654 99 %     Weight --      Height --      Head Circumference --      Peak Flow --      Pain Score 04/21/22 1653 0     Pain Loc --      Pain Edu? --      Excl. in [place]? --    No data found.  Updated Vital Signs BP (!) 167/84   Pulse 86   Temp 97.7 F (36.5 C)   Resp 17   SpO2 99%   Visual Acuity Right Eye Distance:   Left Eye Distance:   Bilateral Distance:    Right Eye Near:   Left Eye Near:    Bilateral Near:     Physical Exam Constitutional:      General: He is not in acute distress.    Appearance: Normal appearance. He is not toxic-appearing or diaphoretic.  HENT:     Head: Normocephalic and atraumatic.     Right Ear: Tympanic membrane and ear canal normal.     Left Ear: Tympanic membrane and ear canal normal.     Nose: Congestion present.     Mouth/Throat:     Mouth: Mucous membranes are moist.     Pharynx: No posterior oropharyngeal erythema.  Eyes:     Extraocular Movements: Extraocular movements intact.     Conjunctiva/sclera: Conjunctivae normal.     Pupils: Pupils are equal, round, and reactive to light.  Cardiovascular:     Rate and Rhythm: Normal rate and regular rhythm.     Pulses: Normal pulses.     Heart sounds: Normal heart sounds.  Pulmonary:      Effort: Pulmonary effort is normal. No respiratory distress.     Breath sounds: No stridor. Wheezing present. No rhonchi or rales.     Comments: Very mild wheezing noted bilaterally.  Chest:     Chest wall: No tenderness.  Abdominal:     General: Abdomen is flat. Bowel sounds are normal.     Palpations: Abdomen is soft.  Musculoskeletal:        General: Normal range of motion.     Cervical back: Normal range of motion.  Skin:    General: Skin is warm and dry.  Neurological:     General: No focal deficit present.     Mental Status: He is alert and oriented to person, place, and time. Mental status is at baseline.     Cranial Nerves: Cranial nerves 2-12 are intact.     Sensory: Sensation is intact.     Motor: Motor function is intact.     Coordination: Coordination is intact.     Gait: Gait is intact.  Psychiatric:        Mood and Affect: Mood normal.        Behavior: Behavior normal.      UC Treatments / Results  Labs (all labs ordered are listed, but only abnormal results are displayed) Labs Reviewed - No data to display  EKG   Radiology DG Chest 2 View  Result Date: 04/21/2022 CLINICAL DATA:  Cough, wheezing, congestion, right lung cancer EXAM: CHEST - 2 VIEW COMPARISON:  03/04/2021, 02/06/2022 FINDINGS: Frontal and lateral views of the chest demonstrate an unremarkable cardiac silhouette. Chronic consolidation within the right apex  compatible with treated lung cancer. Please refer to recent PET scan 02/06/2022. No acute airspace disease, effusion, or pneumothorax. Chronic right pleural thickening. No pneumothorax. No acute bony abnormalities. IMPRESSION: 1. Stable right apical consolidation consistent with treated lung cancer. Please refer to recent PET CT report. 2. No acute airspace disease. Electronically Signed   By: Randa Ngo M.D.   On: 04/21/2022 17:38    Procedures Procedures (including critical care time)  Medications Ordered in UC Medications - No  data to display  Initial Impression / Assessment and Plan / UC Course  I have reviewed the triage vital signs and the nursing notes.  Pertinent labs & imaging results that were available during my care of the patient were reviewed by me and considered in my medical decision making (see chart for details).     Patient presents with symptoms likely from a viral upper respiratory infection. Differential includes bacterial pneumonia, sinusitis, allergic rhinitis, COVID-19, flu, RSV.  Patient is nontoxic appearing and not in need of emergent medical intervention. Vital signs and oxygen are normal. Mild wheezing noted on exam. Chest x-ray negative for any acute issues.  Stable send nothing emergent evaluation is necessary.  Patient declined COVID testing with shared decision making has not would not change treatment given duration of symptoms.  Limited options for treatment given patient's daily medications and chronic health conditions. Will avoid oral prednisone given patient's latest oncology note as it reads that he is currently being treated for prostate cancer and this poses risks.  I do think the patient would benefit from an inhaled steroid so Qvar was prescribed.  Patient advised to rinse his mouth out after administration of this to avoid thrush.  Also prescribed cough medicine for patient.  Patient has elevated blood pressure reading with recheck being improved.  Patient is attributing blood pressure to being very upset about a recent phone call prior to being pulled into the exam room.  Patient is asymptomatic regarding blood pressure so do not think that emergent evaluation is necessary.  He has a home blood pressure cuff and encouraged patient to monitor this at home.  Advised strict return precautions regarding blood pressure.  Neuro exam is normal.  Return if symptoms fail to improve.  Patient given strict return and ER precautions.  Patient states understanding and is  agreeable.  Discharged with PCP followup.  Final Clinical Impressions(s) / UC Diagnoses   Final diagnoses:  Viral upper respiratory tract infection with cough     Discharge Instructions      You have a viral upper respiratory infection that should run its course and self resolve with symptomatic treatment as we discussed.  I have sent you a medication to help alleviate mucus, phlegm, cough.  I would recommend that you use your albuterol inhaler consistently over the next 24 hours while awake.  Please follow-up if any symptoms persist or worsen.  Please monitor your blood pressure very closely at home and follow-up with PCP or urgent care if it remains elevated.     ED Prescriptions     Medication Sig Dispense Auth. Provider   guaiFENesin 200 MG tablet Take 1 tablet (200 mg total) by mouth every 4 (four) hours as needed for cough or to loosen phlegm. 30 tablet [place], [place] E, [place]   beclomethasone (QVAR REDIHALER) 40 MCG/ACT inhaler Inhale 1 puff into the lungs 2 (two) times daily. 1 each Teodora Medici,       PDMP not reviewed this encounter.  Teodora Medici, [place] 04/21/22 [phone]

## 2022-04-21 NOTE — Discharge Instructions (Addendum)
You have a viral upper respiratory infection that should run its course and self resolve with symptomatic treatment as we discussed.  I have sent you a medication to help alleviate mucus, phlegm, cough.  I would recommend that you use your albuterol inhaler consistently over the next 24 hours while awake.  Please follow-up if any symptoms persist or worsen.  Please monitor your blood pressure very closely at home and follow-up with PCP or urgent care if it remains elevated.

## 2022-04-22 ENCOUNTER — Other Ambulatory Visit: Payer: Self-pay

## 2022-04-22 ENCOUNTER — Inpatient Hospital Stay (HOSPITAL_COMMUNITY): Payer: Medicare Other

## 2022-04-22 ENCOUNTER — Encounter (HOSPITAL_COMMUNITY): Payer: Self-pay

## 2022-04-22 ENCOUNTER — Inpatient Hospital Stay (HOSPITAL_COMMUNITY)
Admission: EM | Admit: 2022-04-22 | Discharge: 2022-04-28 | DRG: 190 | Disposition: A | Discharge status: Home / Self Care | Payer: Medicare Other | Attending: Internal Medicine | Admitting: Internal Medicine

## 2022-04-22 ENCOUNTER — Emergency Department (HOSPITAL_COMMUNITY): Payer: Medicare Other

## 2022-04-22 DIAGNOSIS — T380X5A Adverse effect of glucocorticoids and synthetic analogues, initial encounter: Secondary | ICD-10-CM | POA: Diagnosis not present

## 2022-04-22 DIAGNOSIS — E876 Hypokalemia: Secondary | ICD-10-CM | POA: Diagnosis present

## 2022-04-22 DIAGNOSIS — E785 Hyperlipidemia, unspecified: Secondary | ICD-10-CM | POA: Diagnosis present

## 2022-04-22 DIAGNOSIS — I130 Hypertensive heart and chronic kidney disease with heart failure and stage 1 through stage 4 chronic kidney disease, or unspecified chronic kidney disease: Secondary | ICD-10-CM | POA: Diagnosis present

## 2022-04-22 DIAGNOSIS — J9601 Acute respiratory failure with hypoxia: Secondary | ICD-10-CM | POA: Diagnosis present

## 2022-04-22 DIAGNOSIS — E1165 Type 2 diabetes mellitus with hyperglycemia: Secondary | ICD-10-CM | POA: Diagnosis not present

## 2022-04-22 DIAGNOSIS — R059 Cough, unspecified: Secondary | ICD-10-CM | POA: Diagnosis not present

## 2022-04-22 DIAGNOSIS — I21A1 Myocardial infarction type 2: Secondary | ICD-10-CM | POA: Diagnosis not present

## 2022-04-22 DIAGNOSIS — R000 Tachycardia, unspecified: Secondary | ICD-10-CM | POA: Diagnosis not present

## 2022-04-22 DIAGNOSIS — M533 Sacrococcygeal disorders, not elsewhere classified: Secondary | ICD-10-CM | POA: Diagnosis not present

## 2022-04-22 DIAGNOSIS — F1021 Alcohol dependence, in remission: Secondary | ICD-10-CM | POA: Diagnosis present

## 2022-04-22 DIAGNOSIS — Z923 Personal history of irradiation: Secondary | ICD-10-CM

## 2022-04-22 DIAGNOSIS — M25552 Pain in left hip: Secondary | ICD-10-CM | POA: Diagnosis present

## 2022-04-22 DIAGNOSIS — Z8249 Family history of ischemic heart disease and other diseases of the circulatory system: Secondary | ICD-10-CM

## 2022-04-22 DIAGNOSIS — Z811 Family history of alcohol abuse and dependence: Secondary | ICD-10-CM | POA: Diagnosis not present

## 2022-04-22 DIAGNOSIS — I5022 Chronic systolic (congestive) heart failure: Secondary | ICD-10-CM

## 2022-04-22 DIAGNOSIS — Z85118 Personal history of other malignant neoplasm of bronchus and lung: Secondary | ICD-10-CM

## 2022-04-22 DIAGNOSIS — I499 Cardiac arrhythmia, unspecified: Secondary | ICD-10-CM | POA: Diagnosis not present

## 2022-04-22 DIAGNOSIS — Z87891 Personal history of nicotine dependence: Secondary | ICD-10-CM | POA: Diagnosis not present

## 2022-04-22 DIAGNOSIS — J441 Chronic obstructive pulmonary disease with (acute) exacerbation: Secondary | ICD-10-CM | POA: Diagnosis not present

## 2022-04-22 DIAGNOSIS — J439 Emphysema, unspecified: Secondary | ICD-10-CM | POA: Diagnosis not present

## 2022-04-22 DIAGNOSIS — I214 Non-ST elevation (NSTEMI) myocardial infarction: Secondary | ICD-10-CM

## 2022-04-22 DIAGNOSIS — I428 Other cardiomyopathies: Secondary | ICD-10-CM | POA: Diagnosis not present

## 2022-04-22 DIAGNOSIS — I2510 Atherosclerotic heart disease of native coronary artery without angina pectoris: Secondary | ICD-10-CM | POA: Diagnosis not present

## 2022-04-22 DIAGNOSIS — Z7982 Long term (current) use of aspirin: Secondary | ICD-10-CM

## 2022-04-22 DIAGNOSIS — Z8546 Personal history of malignant neoplasm of prostate: Secondary | ICD-10-CM

## 2022-04-22 DIAGNOSIS — Z743 Need for continuous supervision: Secondary | ICD-10-CM | POA: Diagnosis not present

## 2022-04-22 DIAGNOSIS — I2489 Other forms of acute ischemic heart disease: Secondary | ICD-10-CM | POA: Diagnosis not present

## 2022-04-22 DIAGNOSIS — Z23 Encounter for immunization: Secondary | ICD-10-CM | POA: Diagnosis not present

## 2022-04-22 DIAGNOSIS — E872 Acidosis, unspecified: Secondary | ICD-10-CM | POA: Diagnosis present

## 2022-04-22 DIAGNOSIS — R6889 Other general symptoms and signs: Secondary | ICD-10-CM | POA: Diagnosis not present

## 2022-04-22 DIAGNOSIS — Z1152 Encounter for screening for COVID-19: Secondary | ICD-10-CM | POA: Diagnosis not present

## 2022-04-22 DIAGNOSIS — J90 Pleural effusion, not elsewhere classified: Secondary | ICD-10-CM | POA: Diagnosis not present

## 2022-04-22 DIAGNOSIS — Z96642 Presence of left artificial hip joint: Secondary | ICD-10-CM | POA: Diagnosis present

## 2022-04-22 DIAGNOSIS — Y92239 Unspecified place in hospital as the place of occurrence of the external cause: Secondary | ICD-10-CM | POA: Diagnosis not present

## 2022-04-22 DIAGNOSIS — N183 Chronic kidney disease, stage 3 unspecified: Secondary | ICD-10-CM

## 2022-04-22 DIAGNOSIS — E1122 Type 2 diabetes mellitus with diabetic chronic kidney disease: Secondary | ICD-10-CM | POA: Diagnosis not present

## 2022-04-22 DIAGNOSIS — I25110 Atherosclerotic heart disease of native coronary artery with unstable angina pectoris: Secondary | ICD-10-CM | POA: Diagnosis present

## 2022-04-22 DIAGNOSIS — R778 Other specified abnormalities of plasma proteins: Secondary | ICD-10-CM | POA: Diagnosis not present

## 2022-04-22 DIAGNOSIS — Z79899 Other long term (current) drug therapy: Secondary | ICD-10-CM

## 2022-04-22 DIAGNOSIS — I5021 Acute systolic (congestive) heart failure: Secondary | ICD-10-CM | POA: Diagnosis present

## 2022-04-22 DIAGNOSIS — R7989 Other specified abnormal findings of blood chemistry: Secondary | ICD-10-CM | POA: Diagnosis not present

## 2022-04-22 DIAGNOSIS — K219 Gastro-esophageal reflux disease without esophagitis: Secondary | ICD-10-CM | POA: Diagnosis not present

## 2022-04-22 DIAGNOSIS — R062 Wheezing: Secondary | ICD-10-CM | POA: Diagnosis not present

## 2022-04-22 DIAGNOSIS — N182 Chronic kidney disease, stage 2 (mild): Secondary | ICD-10-CM | POA: Diagnosis present

## 2022-04-22 DIAGNOSIS — R0602 Shortness of breath: Secondary | ICD-10-CM | POA: Diagnosis not present

## 2022-04-22 DIAGNOSIS — I10 Essential (primary) hypertension: Secondary | ICD-10-CM | POA: Diagnosis not present

## 2022-04-22 DIAGNOSIS — G479 Sleep disorder, unspecified: Secondary | ICD-10-CM | POA: Diagnosis present

## 2022-04-22 DIAGNOSIS — I2584 Coronary atherosclerosis due to calcified coronary lesion: Secondary | ICD-10-CM | POA: Diagnosis present

## 2022-04-22 DIAGNOSIS — Z841 Family history of disorders of kidney and ureter: Secondary | ICD-10-CM

## 2022-04-22 DIAGNOSIS — E119 Type 2 diabetes mellitus without complications: Secondary | ICD-10-CM

## 2022-04-22 DIAGNOSIS — Z7952 Long term (current) use of systemic steroids: Secondary | ICD-10-CM

## 2022-04-22 DIAGNOSIS — Z7984 Long term (current) use of oral hypoglycemic drugs: Secondary | ICD-10-CM

## 2022-04-22 DIAGNOSIS — M47816 Spondylosis without myelopathy or radiculopathy, lumbar region: Secondary | ICD-10-CM | POA: Diagnosis not present

## 2022-04-22 DIAGNOSIS — C3490 Malignant neoplasm of unspecified part of unspecified bronchus or lung: Secondary | ICD-10-CM | POA: Diagnosis not present

## 2022-04-22 DIAGNOSIS — E1159 Type 2 diabetes mellitus with other circulatory complications: Secondary | ICD-10-CM | POA: Diagnosis present

## 2022-04-22 HISTORY — DX: Chronic obstructive pulmonary disease with (acute) exacerbation: J44.1

## 2022-04-22 HISTORY — DX: Non-ST elevation (NSTEMI) myocardial infarction: I21.4

## 2022-04-22 HISTORY — DX: Alcohol dependence, in remission: F10.21

## 2022-04-22 HISTORY — DX: Malignant neoplasm of prostate: C61

## 2022-04-22 HISTORY — DX: Chronic kidney disease, stage 2 (mild): N18.2

## 2022-04-22 HISTORY — DX: Abdominal aortic ectasia: I77.811

## 2022-04-22 HISTORY — DX: Chronic obstructive pulmonary disease, unspecified: J44.9

## 2022-04-22 HISTORY — DX: Malignant neoplasm of unspecified part of unspecified bronchus or lung: C34.90

## 2022-04-22 LAB — TROPONIN I (HIGH SENSITIVITY)
Troponin I (High Sensitivity): 522 ng/L (ref <18)
Troponin I (High Sensitivity): 506 ng/L (ref <18)

## 2022-04-22 LAB — CBC WITH DIFFERENTIAL/PLATELET
Abs Immature Granulocytes: 0.04 10*3/uL (ref 0.00–0.07)
Basophils Absolute: 0 10*3/uL (ref 0.0–0.1)
Basophils Relative: 0 %
Eosinophils Absolute: 0.1 10*3/uL (ref 0.0–0.5)
Eosinophils Relative: 2 %
HCT: 36.5 % — ABNORMAL LOW (ref 39.0–52.0)
Hemoglobin: 11.6 g/dL — ABNORMAL LOW (ref 13.0–17.0)
Immature Granulocytes: 1 %
Lymphocytes Relative: 8 %
Lymphs Abs: 0.6 10*3/uL — ABNORMAL LOW (ref 0.7–4.0)
MCH: 27.4 pg (ref 26.0–34.0)
MCHC: 31.8 g/dL (ref 30.0–36.0)
MCV: 86.1 fL (ref 80.0–100.0)
Monocytes Absolute: 0.1 10*3/uL (ref 0.1–1.0)
Monocytes Relative: 2 %
Neutro Abs: 6.7 10*3/uL (ref 1.7–7.7)
Neutrophils Relative %: 87 %
Platelets: 230 10*3/uL (ref 150–400)
RBC: 4.24 MIL/uL (ref 4.22–5.81)
RDW: 14.6 % (ref 11.5–15.5)
WBC: 7.6 10*3/uL (ref 4.0–10.5)
nRBC: 0 % (ref 0.0–0.2)

## 2022-04-22 LAB — COMPREHENSIVE METABOLIC PANEL
ALT: 19 U/L (ref 0–44)
AST: 31 U/L (ref 15–41)
Albumin: 3.1 g/dL — ABNORMAL LOW (ref 3.5–5.0)
Alkaline Phosphatase: 60 U/L (ref 38–126)
Anion gap: 12 (ref 5–15)
BUN: 10 mg/dL (ref 8–23)
CO2: 21 mmol/L — ABNORMAL LOW (ref 22–32)
Calcium: 9.3 mg/dL (ref 8.9–10.3)
Chloride: 104 mmol/L (ref 98–111)
Creatinine, Ser: 0.9 mg/dL (ref 0.61–1.24)
GFR, Estimated: >60 mL/min (ref >60)
Glucose, Bld: 168 mg/dL — ABNORMAL HIGH (ref 70–99)
Potassium: 3.1 mmol/L — ABNORMAL LOW (ref 3.5–5.1)
Sodium: 137 mmol/L (ref 135–145)
Total Bilirubin: 0.8 mg/dL (ref 0.3–1.2)
Total Protein: 6.7 g/dL (ref 6.5–8.1)

## 2022-04-22 LAB — LACTIC ACID, PLASMA: Lactic Acid, Venous: 2 mmol/L (ref 0.5–1.9)

## 2022-04-22 LAB — MAGNESIUM: Magnesium: 2.3 mg/dL (ref 1.7–2.4)

## 2022-04-22 LAB — RESP PANEL BY RT-PCR (RSV, FLU A&B, COVID)  RVPGX2
Influenza A by PCR: NEGATIVE
Influenza B by PCR: NEGATIVE
Resp Syncytial Virus by PCR: NEGATIVE
SARS Coronavirus 2 by RT PCR: NEGATIVE

## 2022-04-22 LAB — BRAIN NATRIURETIC PEPTIDE: B Natriuretic Peptide: 925.5 pg/mL — ABNORMAL HIGH (ref 0.0–100.0)

## 2022-04-22 LAB — D-DIMER, QUANTITATIVE: D-Dimer, Quant: 0.86 ug/mL-FEU — ABNORMAL HIGH (ref 0.00–0.50)

## 2022-04-22 MED ORDER — BUDESONIDE 0.25 MG/2ML IN SUSP
0.2500 mg | Freq: Two times a day (BID) | RESPIRATORY_TRACT | Status: DC
  Administered 2022-04-23 – 2022-04-24 (×4): 0.25 mg via RESPIRATORY_TRACT
  Filled 2022-04-22 (×4): qty 2

## 2022-04-22 MED ORDER — IPRATROPIUM-ALBUTEROL 0.5-2.5 (3) MG/3ML IN SOLN
3.0000 mL | Freq: Once | RESPIRATORY_TRACT | Status: DC
  Filled 2022-04-22: qty 3

## 2022-04-22 MED ORDER — BECLOMETHASONE DIPROP HFA 40 MCG/ACT IN AERB: 1.0000 | INHALATION_SPRAY | Freq: Two times a day (BID) | RESPIRATORY_TRACT | Status: DC

## 2022-04-22 MED ORDER — ABIRATERONE ACETATE 250 MG PO TABS: 250.0000 mg | ORAL_TABLET | Freq: Every day | ORAL | Status: DC

## 2022-04-22 MED ORDER — PANTOPRAZOLE SODIUM 40 MG PO TBEC
40.0000 mg | DELAYED_RELEASE_TABLET | Freq: Every day | ORAL | Status: DC
  Administered 2022-04-23 – 2022-04-28 (×6): 40 mg via ORAL
  Filled 2022-04-22 (×6): qty 1

## 2022-04-22 MED ORDER — MELATONIN 5 MG PO TABS
5.0000 mg | ORAL_TABLET | Freq: Every day | ORAL | Status: DC
  Administered 2022-04-23 – 2022-04-24 (×3): 5 mg via ORAL
  Filled 2022-04-22 (×4): qty 1

## 2022-04-22 MED ORDER — MAGNESIUM SULFATE 2 GM/50ML IV SOLN
2.0000 g | Freq: Once | INTRAVENOUS | Status: AC
  Administered 2022-04-22: 2 g via INTRAVENOUS
  Filled 2022-04-22: qty 50

## 2022-04-22 MED ORDER — HEPARIN BOLUS VIA INFUSION
4000.0000 [IU] | Freq: Once | INTRAVENOUS | Status: AC
  Administered 2022-04-23: 4000 [IU] via INTRAVENOUS
  Filled 2022-04-22: qty 4000

## 2022-04-22 MED ORDER — ASPIRIN 81 MG PO TBEC
81.0000 mg | DELAYED_RELEASE_TABLET | Freq: Every day | ORAL | Status: DC
  Administered 2022-04-23 – 2022-04-28 (×6): 81 mg via ORAL
  Filled 2022-04-22 (×6): qty 1

## 2022-04-22 MED ORDER — SODIUM CHLORIDE 0.9 % IV SOLN: 250.0000 mL | INTRAVENOUS | Status: DC | PRN

## 2022-04-22 MED ORDER — LEVALBUTEROL HCL 0.63 MG/3ML IN NEBU
0.6300 mg | INHALATION_SOLUTION | Freq: Once | RESPIRATORY_TRACT | Status: AC
  Administered 2022-04-22: 0.63 mg via RESPIRATORY_TRACT

## 2022-04-22 MED ORDER — IPRATROPIUM BROMIDE 0.02 % IN SOLN
0.5000 mg | Freq: Once | RESPIRATORY_TRACT | Status: AC
  Administered 2022-04-22: 0.5 mg via RESPIRATORY_TRACT
  Filled 2022-04-22: qty 2.5

## 2022-04-22 MED ORDER — ACETAMINOPHEN 650 MG RE SUPP: 650.0000 mg | Freq: Four times a day (QID) | RECTAL | Status: DC | PRN

## 2022-04-22 MED ORDER — LEVALBUTEROL HCL 0.63 MG/3ML IN NEBU
0.6300 mg | INHALATION_SOLUTION | Freq: Three times a day (TID) | RESPIRATORY_TRACT | Status: DC | PRN
  Administered 2022-04-23: 0.63 mg via RESPIRATORY_TRACT
  Filled 2022-04-22: qty 3

## 2022-04-22 MED ORDER — ONDANSETRON HCL 4 MG/2ML IJ SOLN: 4.0000 mg | Freq: Four times a day (QID) | INTRAMUSCULAR | Status: DC | PRN

## 2022-04-22 MED ORDER — VITAMIN D 25 MCG (1000 UNIT) PO TABS
2000.0000 [IU] | ORAL_TABLET | Freq: Every day | ORAL | Status: DC
  Administered 2022-04-23 – 2022-04-28 (×6): 2000 [IU] via ORAL
  Filled 2022-04-22 (×6): qty 2

## 2022-04-22 MED ORDER — IOHEXOL 350 MG/ML SOLN
75.0000 mL | Freq: Once | INTRAVENOUS | Status: AC | PRN
  Administered 2022-04-22: 75 mL via INTRAVENOUS

## 2022-04-22 MED ORDER — SODIUM CHLORIDE 0.9% FLUSH
3.0000 mL | Freq: Two times a day (BID) | INTRAVENOUS | Status: DC
  Administered 2022-04-23 – 2022-04-27 (×9): 3 mL via INTRAVENOUS

## 2022-04-22 MED ORDER — SODIUM CHLORIDE 0.9% FLUSH
3.0000 mL | INTRAVENOUS | Status: DC | PRN
  Administered 2022-04-23 – 2022-04-27 (×2): 3 mL via INTRAVENOUS

## 2022-04-22 MED ORDER — POTASSIUM CHLORIDE CRYS ER 20 MEQ PO TBCR
40.0000 meq | EXTENDED_RELEASE_TABLET | Freq: Once | ORAL | Status: AC
  Administered 2022-04-22: 40 meq via ORAL
  Filled 2022-04-22: qty 2

## 2022-04-22 MED ORDER — ACETAMINOPHEN 325 MG PO TABS
650.0000 mg | ORAL_TABLET | Freq: Four times a day (QID) | ORAL | Status: DC | PRN
  Administered 2022-04-23 – 2022-04-27 (×3): 650 mg via ORAL
  Filled 2022-04-22 (×2): qty 2

## 2022-04-22 MED ORDER — METOPROLOL TARTRATE 25 MG PO TABS
25.0000 mg | ORAL_TABLET | Freq: Two times a day (BID) | ORAL | Status: DC
  Administered 2022-04-23 – 2022-04-26 (×8): 25 mg via ORAL
  Filled 2022-04-22 (×7): qty 1

## 2022-04-22 MED ORDER — INSULIN ASPART 100 UNIT/ML IJ SOLN
0.0000 [IU] | Freq: Three times a day (TID) | INTRAMUSCULAR | Status: DC
  Administered 2022-04-23 – 2022-04-24 (×2): 2 [IU] via SUBCUTANEOUS
  Administered 2022-04-24 (×2): 3 [IU] via SUBCUTANEOUS
  Administered 2022-04-25: 5 [IU] via SUBCUTANEOUS
  Administered 2022-04-25: 3 [IU] via SUBCUTANEOUS
  Administered 2022-04-25: 2 [IU] via SUBCUTANEOUS
  Administered 2022-04-26: 3 [IU] via SUBCUTANEOUS
  Administered 2022-04-26: 2 [IU] via SUBCUTANEOUS
  Administered 2022-04-27 – 2022-04-28 (×2): 3 [IU] via SUBCUTANEOUS
  Filled 2022-04-22: qty 0.15

## 2022-04-22 MED ORDER — LISINOPRIL 5 MG PO TABS
2.5000 mg | ORAL_TABLET | Freq: Every day | ORAL | Status: DC
  Administered 2022-04-23 – 2022-04-24 (×2): 2.5 mg via ORAL
  Filled 2022-04-22 (×2): qty 1

## 2022-04-22 MED ORDER — HEPARIN (PORCINE) 25000 UT/250ML-% IV SOLN
1250.0000 [IU]/h | INTRAVENOUS | Status: DC
  Administered 2022-04-23: 1000 [IU]/h via INTRAVENOUS
  Administered 2022-04-23: 1100 [IU]/h via INTRAVENOUS
  Filled 2022-04-22 (×2): qty 250

## 2022-04-22 MED ORDER — PRAVASTATIN SODIUM 40 MG PO TABS
40.0000 mg | ORAL_TABLET | Freq: Every day | ORAL | Status: DC
  Administered 2022-04-23 – 2022-04-27 (×5): 40 mg via ORAL
  Filled 2022-04-22 (×5): qty 1

## 2022-04-22 MED ORDER — ALBUTEROL SULFATE (2.5 MG/3ML) 0.083% IN NEBU
10.0000 mg/h | INHALATION_SOLUTION | Freq: Once | RESPIRATORY_TRACT | Status: DC
  Filled 2022-04-22: qty 12

## 2022-04-22 MED ORDER — IPRATROPIUM BROMIDE 0.02 % IN SOLN: 0.5000 mg | Freq: Once | RESPIRATORY_TRACT | Status: AC

## 2022-04-22 MED ORDER — METHYLPREDNISOLONE SODIUM SUCC 125 MG IJ SOLR
125.0000 mg | Freq: Once | INTRAMUSCULAR | Status: AC
  Administered 2022-04-22: 125 mg via INTRAVENOUS
  Filled 2022-04-22: qty 2

## 2022-04-22 MED ORDER — PREDNISONE 20 MG PO TABS
40.0000 mg | ORAL_TABLET | Freq: Every day | ORAL | Status: DC
  Administered 2022-04-24: 40 mg via ORAL
  Filled 2022-04-22: qty 2

## 2022-04-22 MED ORDER — ONDANSETRON HCL 4 MG PO TABS: 4.0000 mg | ORAL_TABLET | Freq: Four times a day (QID) | ORAL | Status: DC | PRN

## 2022-04-22 MED ORDER — GUAIFENESIN 200 MG PO TABS
200.0000 mg | ORAL_TABLET | ORAL | Status: DC | PRN
  Administered 2022-04-23: 200 mg via ORAL
  Filled 2022-04-22 (×2): qty 1

## 2022-04-22 MED ORDER — ASPIRIN 81 MG PO CHEW
324.0000 mg | CHEWABLE_TABLET | Freq: Once | ORAL | Status: AC
  Administered 2022-04-22: 324 mg via ORAL
  Filled 2022-04-22: qty 4

## 2022-04-22 MED ORDER — METHYLPREDNISOLONE SODIUM SUCC 40 MG IJ SOLR
40.0000 mg | Freq: Two times a day (BID) | INTRAMUSCULAR | Status: AC
  Administered 2022-04-23 (×2): 40 mg via INTRAVENOUS
  Filled 2022-04-22 (×2): qty 1

## 2022-04-22 MED ORDER — MAGNESIUM OXIDE -MG SUPPLEMENT 400 (240 MG) MG PO TABS
400.0000 mg | ORAL_TABLET | Freq: Every day | ORAL | Status: DC
  Administered 2022-04-23 – 2022-04-28 (×6): 400 mg via ORAL
  Filled 2022-04-22 (×6): qty 1

## 2022-04-22 MED ORDER — METHYLPREDNISOLONE SODIUM SUCC 40 MG IJ SOLR: 40.0000 mg | Freq: Two times a day (BID) | INTRAMUSCULAR | Status: DC

## 2022-04-22 MED ORDER — OMEGA-3-ACID ETHYL ESTERS 1 G PO CAPS
1.0000 g | ORAL_CAPSULE | Freq: Every day | ORAL | Status: DC
  Administered 2022-04-23 – 2022-04-28 (×6): 1 g via ORAL
  Filled 2022-04-22 (×6): qty 1

## 2022-04-22 MED ORDER — PREDNISONE 20 MG PO TABS: 40.0000 mg | ORAL_TABLET | Freq: Every day | ORAL | Status: DC

## 2022-04-22 NOTE — Assessment & Plan Note (Signed)
Most likely related to metformin use in this patient with acute COPD exacerbation No evidence of an acute infection at this time Will trend lactic acid levels

## 2022-04-22 NOTE — Assessment & Plan Note (Signed)
Continue PPI ?

## 2022-04-22 NOTE — ED Triage Notes (Signed)
[facility] reports pt coming from home c/o sob. Hx of COPD. Past 7 days with cough and sob. Seen at [facility] and diagnosed with URI and given antibiotics. Pt borrowed friends 02 for his breathing.

## 2022-04-22 NOTE — Progress Notes (Signed)
RT NOTE:  RT placed pt on cont nebulizer treatment and pt HR increased to 140s-150s. R[place]our, MD called and verbal order given to RT to stop cont neb and give one time dose of xopenx and atrovent nebulizer instead.

## 2022-04-22 NOTE — Progress Notes (Signed)
ANTICOAGULATION CONSULT NOTE - Initial Consult  Pharmacy Consult for Heparin Indication: chest pain/ACS  Allergies  Allergen Reactions   No Known Allergies    Lemon Oil Rash    Patient Measurements:   Heparin Dosing Weight: actual body weight Initiated heparin therapy using most recent EPIC weight of 81 kg (Oct 2023)  Vital Signs: Temp: 97.8 F (36.6 C) (12/13 2137) Temp Source: Oral (12/13 2137) BP: 149/92 (12/13 2030) Pulse Rate: 124 (12/13 2030)  Labs: Recent Labs    04/22/22 1945  HGB 11.6*  HCT 36.5*  PLT 230  CREATININE 0.90  TROPONINIHS 522*    CrCl cannot be calculated (Unknown ideal weight.).   Medical History: Past Medical History:  Diagnosis Date   Diabetes ([place])    Femur fracture, right ([place])    GERD (gastroesophageal reflux disease) 12/05/2015   Well controlled on meds   H/O: substance abuse ([place]) 1999   Hip fracture ([place])    HLD (hyperlipidemia) 12/05/2015   10+ yrs at least.    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 PMH significant for DM, lung cancer, COPD Elevated troponin  Goal of Therapy:  Heparin level 0.3-0.7 units/ml Monitor platelets by anticoagulation protocol: Yes   Plan:  Obtain baseline aPTT and PT/INR Ordered updated weight to be documented in EPIC - will confirm heparin dosing once current weight recorded Heparin 4000 units IV bolus x 1 Heparin gtt @ 1000 units/hr Check heparin level 8 hr after heparin started Daily CBC and heparin level  Chayce Robbins, Toribio Harbour, PharmD 04/22/2022,11:11 PM

## 2022-04-22 NOTE — Assessment & Plan Note (Signed)
Most likely secondary to demand ischemia from tachycardia and acute COPD exacerbation Patient's cardiac risk factors include hypertension and diabetes mellitus We will cycle troponin levels Start patient on heparin drip per protocol Continue aspirin, statins and will add metoprolol Obtain 2D echocardiogram to assess LVEF and rule out regional wall motion abnormality Consult cardiology

## 2022-04-22 NOTE — ED Provider Notes (Signed)
[facility] DEPT Provider Note   CSN: [id number] Arrival date & time: 04/22/22  1741     History  Chief Complaint  Patient presents with   Shortness of Breath    Alan Hurst is a 76 y.o. male.  , Prostate  Patient with history of diabetes answered, lung cancer in remission and COPD presenting with a 7-day history of increased work of breathing, cough, congestion and chest tightness.  States his COPD is usually well-controlled and uses albuterol only as needed.  He has used it 6 or 7 times daily over the past 3 days.  Was seen at urgent care yesterday and feels he is much worse.  Cough is relatively clear and yellow mucus.  He does have some chest tightness.  Feels like he has burning in his chest.  No abdominal pain, nausea vomiting or fever.  No leg pain or leg swelling.  No history of CHF or CAD.  No history of blood clots.  He has never been hospitalized for his breathing.  Denies any COVID or flu contacts.  The history is provided by the patient and the EMS personnel.  Shortness of Breath Associated symptoms: cough   Associated symptoms: no abdominal pain, no chest pain, no fever, no headaches, no rash and no vomiting        Home Medications Prior to Admission medications   Medication Sig Start Date End Date Taking? Authorizing Provider  abiraterone acetate (ZYTIGA) 250 MG tablet Take by mouth. 09/16/20   [provider]  aspirin EC 81 MG tablet Take 81 mg by mouth daily.    [provider]  beclomethasone (QVAR REDIHALER) 40 MCG/ACT inhaler Inhale 1 puff into the lungs 2 (two) times daily. 04/21/22   Teodora Medici, FNP  Cholecalciferol (VITAMIN D) 2000 units tablet Take 1 tablet by mouth daily. 04/25/13   [provider]  ciclopirox (PENLAC) 8 % solution Apply topically at bedtime. Apply over nail and surrounding skin. Apply daily over previous coat. After seven (7) days, may remove with alcohol and continue cycle.  03/03/22   Abonza, Maritza, PA-C  fluticasone (FLONASE) 50 MCG/ACT nasal spray Place 1 spray into both nostrils daily. 11/20/21   Lorrene Reid, PA-C  guaiFENesin 200 MG tablet Take 1 tablet (200 mg total) by mouth every 4 (four) hours as needed for cough or to loosen phlegm. 04/21/22   Teodora Medici, FNP  lisinopril (ZESTRIL) 2.5 MG tablet TAKE 1 TABLET BY MOUTH DAILY 01/23/22   Ronnell Freshwater, NP  lovastatin (MEVACOR) 40 MG tablet TAKE 1 TABLET BY MOUTH AT  BEDTIME 01/23/22   Ronnell Freshwater, NP  magnesium oxide (MAG-OX) 400 MG tablet Take 400 mg by mouth daily.    [provider]  Melatonin 1 MG TABS Take 5 mg by mouth at bedtime.     [provider]  metFORMIN (GLUCOPHAGE) 500 MG tablet Take 1 tablet (500 mg total) by mouth daily. 11/20/21   Lorrene Reid, PA-C  Omega-3 Fatty Acids (FISH OIL) 1000 MG CAPS Take 1 capsule by mouth daily. 04/25/13   [provider]  omeprazole (PRILOSEC) 20 MG capsule TAKE 1 CAPSULE BY MOUTH DAILY 02/18/22   Abonza, Maritza, PA-C  predniSONE (DELTASONE) 5 MG tablet Take 5 mg by mouth daily. 09/16/20   [provider]      Allergies    No known allergies and Lemon oil    Review of Systems   Review of Systems  Constitutional:  Positive for activity change and appetite change. Negative for fever.  HENT:  Positive for congestion and rhinorrhea.   Respiratory:  Positive for cough, chest tightness and shortness of breath.   Cardiovascular:  Negative for chest pain.  Gastrointestinal:  Negative for abdominal pain, nausea and vomiting.  Genitourinary:  Negative for dysuria and hematuria.  Musculoskeletal:  Negative for arthralgias and myalgias.  Skin:  Negative for rash.  Neurological:  Negative for headaches.   all other systems are negative except as noted in the HPI and PMH.    Physical Exam Updated Vital Signs BP (!) 149/92   Pulse (!) 124   Temp 97.8 F (36.6 C) (Oral)   Resp (!) 21   Wt 83.9 kg   SpO2 97%    BMI 27.32 kg/m  Physical Exam Vitals and nursing note reviewed.  Constitutional:      General: He is not in acute distress.    Appearance: He is well-developed. He is ill-appearing.     Comments: Moderate respiratory distress, speaking in short phrases  HENT:     Head: Normocephalic and atraumatic.     Mouth/Throat:     Pharynx: No oropharyngeal exudate.  Eyes:     Conjunctiva/sclera: Conjunctivae normal.     Pupils: Pupils are equal, round, and reactive to light.  Neck:     Comments: No meningismus. Cardiovascular:     Rate and Rhythm: Regular rhythm. Tachycardia present.     Heart sounds: Normal heart sounds. No murmur heard. Pulmonary:     Effort: Pulmonary effort is normal. No respiratory distress.     Breath sounds: Wheezing present.     Comments: Diffuse inspiratory and expiratory wheezing bilaterally Abdominal:     Palpations: Abdomen is soft.     Tenderness: There is no abdominal tenderness. There is no guarding or rebound.  Musculoskeletal:        General: No tenderness. Normal range of motion.     Cervical back: Normal range of motion and neck supple.  Skin:    General: Skin is warm.  Neurological:     Mental Status: He is alert and oriented to person, place, and time.     Cranial Nerves: No cranial nerve deficit.     Motor: No abnormal muscle tone.     Coordination: Coordination normal.     Comments: No ataxia on finger to nose bilaterally. No pronator drift. 5/5 strength throughout. CN 2-12 intact.Equal grip strength. Sensation intact.   Psychiatric:        Behavior: Behavior normal.     ED Results / Procedures / Treatments   Labs (all labs ordered are listed, but only abnormal results are displayed) Labs Reviewed  CBC WITH DIFFERENTIAL/PLATELET - Abnormal; Notable for the following components:      Result Value   Hemoglobin 11.6 (*)    HCT 36.5 (*)    Lymphs Abs 0.6 (*)    All other components within normal limits  COMPREHENSIVE METABOLIC PANEL -  Abnormal; Notable for the following components:   Potassium 3.1 (*)    CO2 21 (*)    Glucose, Bld 168 (*)    Albumin 3.1 (*)    All other components within normal limits  LACTIC ACID, PLASMA - Abnormal; Notable for the following components:   Lactic Acid, Venous 2.0 (*)    All other components within normal limits  BRAIN NATRIURETIC PEPTIDE - Abnormal; Notable for the following components:   B Natriuretic Peptide 925.5 (*)    All  other components within normal limits  D-DIMER, QUANTITATIVE - Abnormal; Notable for the following components:   D-Dimer, Quant 0.86 (*)    All other components within normal limits  TROPONIN I (HIGH SENSITIVITY) - Abnormal; Notable for the following components:   Troponin I (High Sensitivity) 522 (*)    All other components within normal limits  CULTURE, BLOOD (ROUTINE X 2)  CULTURE, BLOOD (ROUTINE X 2)  RESP PANEL BY RT-PCR (RSV, FLU A&B, COVID)  RVPGX2  MAGNESIUM  PROTIME-INR  APTT  CBC  BASIC METABOLIC PANEL  HEPARIN LEVEL (UNFRACTIONATED)  TROPONIN I (HIGH SENSITIVITY)    EKG EKG Interpretation  Date/Time:  Wednesday April 22 2022 18:11:01 EST Ventricular Rate:  135 PR Interval:  186 QRS Duration: 86 QT Interval:  315 QTC Calculation: 473 R Axis:   49 Text Interpretation: Sinus tachycardia Rate faster Confirmed by Ezequiel Essex [phone]) on 04/22/2022 6:27:56 PM  Radiology DG Chest 2 View  Result Date: 04/21/2022 CLINICAL DATA:  Cough, wheezing, congestion, right lung cancer EXAM: CHEST - 2 VIEW COMPARISON:  03/04/2021, 02/06/2022 FINDINGS: Frontal and lateral views of the chest demonstrate an unremarkable cardiac silhouette. Chronic consolidation within the right apex compatible with treated lung cancer. Please refer to recent PET scan 02/06/2022. No acute airspace disease, effusion, or pneumothorax. Chronic right pleural thickening. No pneumothorax. No acute bony abnormalities. IMPRESSION: 1. Stable right apical consolidation  consistent with treated lung cancer. Please refer to recent PET CT report. 2. No acute airspace disease. Electronically Signed   By: Randa Ngo M.D.   On: 04/21/2022 17:38    Procedures .Critical Care  Performed by: Ezequiel Essex, MD Authorized by: Ezequiel Essex, MD   Critical care provider statement:    Critical care time (minutes):  35   Critical care time was exclusive of:  Separately billable procedures and treating other patients   Critical care was necessary to treat or prevent imminent or life-threatening deterioration of the following conditions:  Respiratory failure   Critical care was time spent personally by me on the following activities:  Development of treatment plan with patient or surrogate, discussions with consultants, evaluation of patient's response to treatment, examination of patient, ordering and review of laboratory studies, ordering and review of radiographic studies, ordering and performing treatments and interventions, pulse oximetry, re-evaluation of patient's condition, review of old charts and blood draw for specimens   I assumed direction of critical care for this patient from another provider in my specialty: no     Care discussed with: admitting provider       Medications Ordered in ED Medications  ipratropium-albuterol (DUONEB) 0.5-2.5 (3) MG/3ML nebulizer solution 3 mL (has no administration in time range)  methylPREDNISolone sodium succinate (SOLU-MEDROL) 125 mg/2 mL injection 125 mg (has no administration in time range)  albuterol (PROVENTIL) (2.5 MG/3ML) 0.083% nebulizer solution (has no administration in time range)  ipratropium (ATROVENT) nebulizer solution 0.5 mg (has no administration in time range)  magnesium sulfate IVPB 2 g 50 mL (has no administration in time range)    ED Course/ Medical Decision Making/ A&P                           Medical Decision Making Amount and/or Complexity of Data Reviewed Labs: ordered. Decision-making  details documented in ED Course. Radiology: ordered and independent interpretation performed. Decision-making details documented in ED Course. ECG/medicine tests: ordered and independent interpretation performed. Decision-making details documented in ED Course.  Risk OTC  drugs. Prescription drug management. Decision regarding hospitalization.  7 days of difficulty breathing with cough and congestion, history of COPD.  Wheezing extensively on arrival.  He is given bronchodilators, steroids and magnesium.  Tachycardia and EKG.  No acute ST changes.  Diminished breath sounds on arrival given bronchodilators Bleiler's, steroids and magnesium.  Chest x-ray shows areas of chronic scarring without infiltrate.  Results reviewed and interpreted by me.  Work of breathing has improved.  Denies chest pain but still tachypneic and tachycardic.  Will likely require admission for COPD exacerbation.  COVID and flu swabs are negative.  Troponin 522.  Patient denies any chest pain.  EKG without acute ischemia.  Suspect likely demand due to work of breathing. Repeat troponin pending.   Given aspirin.  Will add on D-dimer and repeat troponin.  Will plan admission for COPD exacerbation with elevated troponin.  D/w Dr. Francine Graven.         Final Clinical Impression(s) / ED Diagnoses Final diagnoses:  None    Rx / DC Orders ED Discharge Orders     None         Laquiesha Piacente, Annie Main, MD 04/22/22 2346

## 2022-04-22 NOTE — Assessment & Plan Note (Signed)
Hold metformin Maintain consistent carbohydrate diet Sliding scale insulin for glycemic control

## 2022-04-22 NOTE — Assessment & Plan Note (Signed)
Patient with known history of COPD who presents to the ER for evaluation of a cough productive of clear phlegm associated with wheezing and shortness of breath Place patient on systemic and inhaled steroids Place patient on as needed bronchodilator therapy will use Xopenex due to significant tachycardia Supportive care with antitussives Oxygen supplementation to maintain pulse oximetry greater than 92%

## 2022-04-22 NOTE — Assessment & Plan Note (Signed)
Continue lisinopril and metoprolol ?

## 2022-04-22 NOTE — Assessment & Plan Note (Signed)
Supplement potassium Check magnesium levels

## 2022-04-22 NOTE — Assessment & Plan Note (Signed)
>>  ASSESSMENT AND PLAN FOR TYPE 2 DIABETES MELLITUS WITHOUT COMPLICATION, WITHOUT LONG-TERM CURRENT USE OF INSULIN ([place]) WRITTEN ON 04/22/2022 11:24 PM BY AGBATA, TOCHUKWU, MD  Hold metformin Maintain consistent carbohydrate diet Sliding scale insulin for glycemic control

## 2022-04-22 NOTE — H&P (Signed)
History and Physical    Patient: Alan Hurst JOA:[id number] DOB: Jun 07, 1945 DOA: 04/22/2022 DOS: the patient was seen and examined on 04/22/2022 PCP: Lorrene Reid, PA-C  Patient coming from: Home  Chief Complaint:  Chief Complaint  Patient presents with   Shortness of Breath   HPI: Alan Hurst is a 76 y.o. male with medical history significant for diabetes mellitus on oral agents, GERD, hypertension who presents to the ER via EMS for evaluation of shortness of breath and cough. Patient states that he has had symptoms for about a week and has a cough that is productive of clear phlegm associated with exertional shortness of breath as well as intermittent midsternal chest pain worse with coughing and nonradiating.  He denies having any associated nausea, no vomiting, no diaphoresis or palpitations.  He denies having any fever or chills.  He has bilateral lower extremity swelling (Rt >> Lt) Patient states that he has been using his albuterol metered-dose inhaler without any significant improvement in his symptoms. He was seen at the urgent care clinic 1 day prior to his admission and was prescribed a steroid inhaler and Mucinex which patient states did not provide any relief prompting his visit to the emergency room. He denies having any abdominal pain, no changes in his bowel habits, no urinary symptoms, no dizziness, no lightheadedness, no headache, no blurred vision, no focal deficit. Significant labs include BNP 925, potassium 3.1, troponin 522, lactic acid 2.0 Chest x-ray reviewed by me shows stable right apical consolidation consistent with treated lung cancer.  No acute airspace disease. Twelve-lead EKG reviewed by me shows sinus tachycardia    Review of Systems: As mentioned in the history of present illness. All other systems reviewed and are negative. Past Medical History:  Diagnosis Date   Diabetes ([place])    Femur fracture, right ([place])    GERD (gastroesophageal reflux  disease) 12/05/2015   Well controlled on meds   H/O: substance abuse ([place]) 1999   Hip fracture ([place])    HLD (hyperlipidemia) 12/05/2015   10+ yrs at least.    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 His smoking use included cigarettes. He started smoking about 60 years ago. He has a 48.00 pack-year smoking history. He has never used smokeless tobacco. He reports that he does not drink alcohol and does not use drugs.  Allergies  Allergen Reactions   No Known Allergies    Lemon Oil Rash    Family History  Problem Relation Age of Onset   Alcohol abuse Mother    Alcohol abuse Father    Congestive Heart Failure Father    Kidney disease Father    Colon cancer Neg Hx    Esophageal cancer Neg Hx    Rectal cancer Neg Hx    Stomach cancer Neg Hx  Prior to Admission medications   Medication Sig Start Date End Date Taking? Authorizing Provider  abiraterone acetate (ZYTIGA) 250 MG tablet Take by mouth. 09/16/20   [provider]  aspirin EC 81 MG tablet Take 81 mg by mouth daily.    [provider]  beclomethasone (QVAR REDIHALER) 40 MCG/ACT inhaler Inhale 1 puff into the lungs 2 (two) times daily. 04/21/22   Teodora Medici, FNP  Cholecalciferol (VITAMIN D) 2000 units tablet Take 1 tablet by mouth daily. 04/25/13   [provider]  ciclopirox (PENLAC) 8 % solution Apply topically at  bedtime. Apply over nail and surrounding skin. Apply daily over previous coat. After seven (7) days, may remove with alcohol and continue cycle. 03/03/22   Abonza, Maritza, PA-C  fluticasone (FLONASE) 50 MCG/ACT nasal spray Place 1 spray into both nostrils daily. 11/20/21   Lorrene Reid, PA-C  guaiFENesin 200 MG tablet Take 1 tablet (200 mg total) by mouth every 4 (four) hours as needed for cough or to loosen phlegm. 04/21/22   Teodora Medici, FNP  lisinopril (ZESTRIL) 2.5 MG tablet TAKE 1 TABLET BY MOUTH DAILY 01/23/22   Ronnell Freshwater, NP  lovastatin (MEVACOR) 40 MG tablet TAKE 1 TABLET BY MOUTH AT  BEDTIME 01/23/22   Ronnell Freshwater, NP  magnesium oxide (MAG-OX) 400 MG tablet Take 400 mg by mouth daily.    [provider]  Melatonin 1 MG TABS Take 5 mg by mouth at bedtime.     [provider]  metFORMIN (GLUCOPHAGE) 500 MG tablet Take 1 tablet (500 mg total) by mouth daily. 11/20/21   Lorrene Reid, PA-C  Omega-3 Fatty Acids (FISH OIL) 1000 MG CAPS Take 1 capsule by mouth daily. 04/25/13   [provider]  omeprazole (PRILOSEC) 20 MG capsule TAKE 1 CAPSULE BY MOUTH DAILY 02/18/22   Abonza, Maritza, PA-C  predniSONE (DELTASONE) 5 MG tablet Take 5 mg by mouth daily. 09/16/20   [provider]    Physical Exam: Vitals:   04/22/22 2000 04/22/22 2015 04/22/22 2030 04/22/22 2137  BP: (!) 172/83 (!) 140/76 (!) 149/92   Pulse: (!) 130 (!) 128 (!) 124   Resp: 20 15 (!) 21   Temp:    97.8 F (36.6 C)  TempSrc:    Oral  SpO2: 98% 97% 97%    Physical Exam Vitals and nursing note reviewed.  Constitutional:      Appearance: He is well-developed.  HENT:     Head: Normocephalic and atraumatic.  Eyes:     Pupils: Pupils are equal, round, and reactive to light.  Cardiovascular:     Rate and Rhythm: Tachycardia present.  Pulmonary:     Effort: Tachypnea present.     Breath sounds: Examination of the right-upper field reveals wheezing. Examination of the  left-upper field reveals wheezing. Examination of the right-middle field reveals wheezing. Examination of the left-middle field reveals wheezing. Examination of the right-lower field reveals wheezing. Examination of the left-lower field reveals wheezing. Wheezing present.  Abdominal:     General: Bowel sounds are normal.     Palpations: Abdomen is soft.  Musculoskeletal:     Cervical back: Normal range of motion and neck supple.     Right lower leg: Edema present.     Left lower leg: Edema present.  Skin:    General: Skin is warm and dry.  Neurological:     General: No focal deficit present.     Mental Status: He is alert.  Psychiatric:        Mood and Affect: Mood normal.        Behavior: Behavior normal.     Data Reviewed: Relevant notes from primary care and specialist visits, past discharge summaries as available in EHR, including Care Everywhere. Prior diagnostic testing as pertinent to current admission diagnoses Updated medications and problem lists for reconciliation ED course, including vitals, labs, imaging, treatment and response to treatment Triage notes, nursing and pharmacy notes and ED provider's notes Notable results as noted in HPI Labs reviewed.  BNP 925, sodium 137, potassium 3.1, chloride 104, bicarb 21, glucose 168, BUN 10, creatinine 0.90, calcium 9.3, total protein 6.7, albumin 3.1, AST 31, ALT 19, alkaline phosphatase 60, troponin 522, lactic acid 2.0, white count 7.6, hemoglobin 11.6, hematocrit 36.5, platelet count 230 Respiratory viral panel is negative There are no new results to review at this time.  Assessment and Plan: * COPD with acute exacerbation ([place]) Patient with known history of COPD who presents to the ER for evaluation of a cough productive of clear phlegm associated with wheezing and shortness of breath Place patient on systemic and inhaled steroids Place patient on as needed bronchodilator therapy will use Xopenex due to significant  tachycardia Supportive care with antitussives Oxygen supplementation to maintain pulse oximetry greater than 92%  NSTEMI (non-ST elevated myocardial infarction) ([place]) Most likely secondary to demand ischemia from tachycardia and acute COPD exacerbation Patient's cardiac risk factors include hypertension and diabetes mellitus We will cycle troponin levels Start patient on heparin drip per protocol Continue aspirin, statins and will add metoprolol Obtain 2D echocardiogram to assess LVEF and rule out regional wall motion abnormality Consult cardiology   Lactic acidosis Most likely related to metformin use in this patient with acute COPD exacerbation No evidence of an acute infection at this time Will trend lactic acid levels  Hypokalemia Supplement potassium Check magnesium levels  Essential hypertension Continue lisinopril and metoprolol  GERD (gastroesophageal reflux disease) Continue PPI  Type 2 diabetes mellitus without complication, without long-term current use of insulin (HCC) Hold metformin Maintain consistent carbohydrate diet Sliding scale insulin for glycemic control      Advance Care Planning:   Code Status: Full Code   Consults: Cardiology in am  Family Communication: Greater than 50% of time was spent discussing patient's condition and plan of care with him at the bedside.  All questions and concerns have been addressed.  He verbalizes understanding and agrees to the plan.  CODE STATUS was discussed and he wishes to be a full code.  He lists his wife as his healthcare power of attorney.  Severity of Illness: The appropriate patient status for this patient is INPATIENT. Inpatient status is judged to be reasonable and necessary in order to provide the required intensity of service to ensure the patient's safety. The patient's presenting symptoms, physical exam findings, and initial radiographic and laboratory data in the context of their chronic comorbidities is  felt to place them at high risk for further clinical deterioration. Furthermore, it is not anticipated that the patient will be medically stable for discharge from the hospital within 2 midnights of admission.   * I certify that at the point of admission it is my clinical judgment that the patient will require inpatient hospital care spanning beyond 2 midnights from the point of admission due to high intensity of service, high risk for further deterioration and high frequency of surveillance required.*  Author: Collier Bullock, MD 04/22/2022 11:25 PM  For on  call review [email or web address].

## 2022-04-23 ENCOUNTER — Inpatient Hospital Stay (HOSPITAL_COMMUNITY): Payer: Medicare Other

## 2022-04-23 ENCOUNTER — Other Ambulatory Visit: Payer: Self-pay

## 2022-04-23 DIAGNOSIS — I214 Non-ST elevation (NSTEMI) myocardial infarction: Secondary | ICD-10-CM

## 2022-04-23 LAB — BASIC METABOLIC PANEL
Anion gap: 10 (ref 5–15)
BUN: 14 mg/dL (ref 8–23)
CO2: 23 mmol/L (ref 22–32)
Calcium: 9.2 mg/dL (ref 8.9–10.3)
Chloride: 104 mmol/L (ref 98–111)
Creatinine, Ser: 1.01 mg/dL (ref 0.61–1.24)
GFR, Estimated: >60 mL/min (ref >60)
Glucose, Bld: 161 mg/dL — ABNORMAL HIGH (ref 70–99)
Potassium: 4 mmol/L (ref 3.5–5.1)
Sodium: 137 mmol/L (ref 135–145)

## 2022-04-23 LAB — CBG MONITORING, ED
Glucose-Capillary: 156 mg/dL — ABNORMAL HIGH (ref 70–99)
Glucose-Capillary: 156 mg/dL — ABNORMAL HIGH (ref 70–99)
Glucose-Capillary: 156 mg/dL — ABNORMAL HIGH (ref 70–99)

## 2022-04-23 LAB — CBC
HCT: 33.7 % — ABNORMAL LOW (ref 39.0–52.0)
Hemoglobin: 11 g/dL — ABNORMAL LOW (ref 13.0–17.0)
MCH: 27 pg (ref 26.0–34.0)
MCHC: 32.6 g/dL (ref 30.0–36.0)
MCV: 82.8 fL (ref 80.0–100.0)
Platelets: 239 10*3/uL (ref 150–400)
RBC: 4.07 MIL/uL — ABNORMAL LOW (ref 4.22–5.81)
RDW: 14.6 % (ref 11.5–15.5)
WBC: 6.7 10*3/uL (ref 4.0–10.5)
nRBC: 0 % (ref 0.0–0.2)

## 2022-04-23 LAB — ECHOCARDIOGRAM COMPLETE
AR max vel: 3.82 cm2
AV Area VTI: 3.67 cm2
AV Area mean vel: 3.2 cm2
AV Mean grad: 2 mmHg
AV Peak grad: 3.4 mmHg
Ao pk vel: 0.93 m/s
Area-P 1/2: 5.02 cm2
Calc EF: 41.7 %
MV M vel: 2.34 m/s
MV Peak grad: 21.8 mmHg
S' Lateral: 4.1 cm
Single Plane A2C EF: 41 %
Single Plane A4C EF: 41.7 %
Weight: 2960 oz

## 2022-04-23 LAB — TROPONIN I (HIGH SENSITIVITY): Troponin I (High Sensitivity): 300 ng/L (ref <18)

## 2022-04-23 LAB — PROTIME-INR
INR: 1.1 (ref 0.8–1.2)
Prothrombin Time: 14.5 seconds (ref 11.4–15.2)

## 2022-04-23 LAB — HEPARIN LEVEL (UNFRACTIONATED)
Heparin Unfractionated: 0.28 IU/mL — ABNORMAL LOW (ref 0.30–0.70)
Heparin Unfractionated: 0.24 IU/mL — ABNORMAL LOW (ref 0.30–0.70)

## 2022-04-23 LAB — APTT: aPTT: 35 seconds (ref 24–36)

## 2022-04-23 LAB — GLUCOSE, CAPILLARY
Glucose-Capillary: 140 mg/dL — ABNORMAL HIGH (ref 70–99)
Glucose-Capillary: 214 mg/dL — ABNORMAL HIGH (ref 70–99)

## 2022-04-23 MED ORDER — LEVALBUTEROL HCL 0.63 MG/3ML IN NEBU
0.6300 mg | INHALATION_SOLUTION | Freq: Three times a day (TID) | RESPIRATORY_TRACT | Status: DC
  Administered 2022-04-23 – 2022-04-24 (×2): 0.63 mg via RESPIRATORY_TRACT
  Filled 2022-04-23 (×2): qty 3

## 2022-04-23 MED ORDER — ABIRATERONE ACETATE 250 MG PO TABS
1000.0000 mg | ORAL_TABLET | Freq: Every day | ORAL | Status: DC
  Administered 2022-04-28: 1000 mg via ORAL

## 2022-04-23 MED ORDER — BENZONATATE 100 MG PO CAPS
100.0000 mg | ORAL_CAPSULE | Freq: Three times a day (TID) | ORAL | Status: DC
  Administered 2022-04-23 – 2022-04-28 (×15): 100 mg via ORAL
  Filled 2022-04-23 (×15): qty 1

## 2022-04-23 MED ORDER — INFLUENZA VAC A&B SA ADJ QUAD 0.5 ML IM PRSY
0.5000 mL | PREFILLED_SYRINGE | INTRAMUSCULAR | Status: DC
  Filled 2022-04-23: qty 0.5

## 2022-04-23 MED ORDER — ALUM & MAG HYDROXIDE-SIMETH 200-200-20 MG/5ML PO SUSP
30.0000 mL | ORAL | Status: DC | PRN
  Administered 2022-04-23 – 2022-04-27 (×6): 30 mL via ORAL
  Filled 2022-04-23 (×6): qty 30

## 2022-04-23 MED ORDER — PERFLUTREN LIPID MICROSPHERE
1.0000 mL | INTRAVENOUS | Status: AC | PRN
  Administered 2022-04-23: 4 mL via INTRAVENOUS

## 2022-04-23 MED ORDER — GUAIFENESIN-DM 100-10 MG/5ML PO SYRP
5.0000 mL | ORAL_SOLUTION | Freq: Four times a day (QID) | ORAL | Status: DC | PRN
  Administered 2022-04-23 – 2022-04-26 (×11): 5 mL via ORAL
  Filled 2022-04-23 (×11): qty 10

## 2022-04-23 NOTE — ED Notes (Signed)
Pt able to walk with steady gait. O2 sats remained 95% or above

## 2022-04-23 NOTE — Progress Notes (Signed)
ANTICOAGULATION CONSULT NOTE - Follow Up Consult  Pharmacy Consult for heparin  Indication: NSTEMI  Allergies  Allergen Reactions   No Known Allergies    Lemon Oil Rash    Patient Measurements: Height: 5\' 9"  (175.3 cm) Weight: 83.9 kg (185 lb) IBW/kg (Calculated) : 70.7 Heparin Dosing Weight: 84 kg  Vital Signs: Temp: 97.6 F (36.4 C) (12/14 2024) Temp Source: Oral (12/14 2024) BP: 132/82 (12/14 2024) Pulse Rate: 110 (12/14 2024)  Labs: Recent Labs    04/22/22 1945 04/22/22 2258 04/23/22 0700 04/23/22 0800 04/23/22 1859  HGB 11.6*  --  11.0*  --   --   HCT 36.5*  --  33.7*  --   --   PLT 230  --  239  --   --   APTT  --  35  --   --   --   LABPROT  --  14.5  --   --   --   INR  --  1.1  --   --   --   HEPARINUNFRC  --   --   --  0.28* 0.24*  CREATININE 0.90  --  1.01  --   --   TROPONINIHS 522* 506* 300*  --   --      Estimated Creatinine Clearance: 62.2 mL/min (by C-G formula based on SCr of 1.01 mg/dL).   Assessment: Patient is a 76 y.o M with hx COPD who presented to the ED on 04/22/22 with c/o cough,chest tightness and SOB.  In the ED, he was found to be tachycardic with elevated troponin.  He's currently on heparin drip for NSTEMI.  Today, 04/23/2022: -  heparin level is subtherapeutic at 0.24. - cbc stable - no line or bleeding documented   Goal of Therapy:  Heparin level 0.3-0.7 units/ml Monitor platelets by anticoagulation protocol: Yes   Plan:  - Increase heparin drip to 1250 units/hr - check 8 hr heparin level - monitor for s/sx bleeding    Royetta Asal, PharmD, BCPS 04/23/2022 9:12 PM

## 2022-04-23 NOTE — Progress Notes (Signed)
ANTICOAGULATION CONSULT NOTE - Follow Up Consult  Pharmacy Consult for heparin  Indication: NSTEMI  Allergies  Allergen Reactions   No Known Allergies    Lemon Oil Rash    Patient Measurements: Weight: 83.9 kg (185 lb) Heparin Dosing Weight: 84 kg  Vital Signs: Temp: 97.8 F (36.6 C) (12/14 0701) Temp Source: Oral (12/14 0701) BP: 138/92 (12/14 0700) Pulse Rate: 109 (12/14 0700)  Labs: Recent Labs    04/22/22 1945 04/22/22 2258 04/23/22 0700  HGB 11.6*  --  11.0*  HCT 36.5*  --  33.7*  PLT 230  --  239  APTT  --  35  --   LABPROT  --  14.5  --   INR  --  1.1  --   CREATININE 0.90  --  1.01  TROPONINIHS 522* 506* 300*    Estimated Creatinine Clearance: 62.2 mL/min (by C-G formula based on SCr of 1.01 mg/dL).   Assessment: Patient is a 76 y.o M with hx COPD who presented to the ED on 04/22/22 with c/o cough,chest tightness and SOB.  In the ED, he was found to be tachycardic with elevated troponin.  He's currently on heparin drip for NSTEMI.  Today, 04/23/2022: - first heparin level is subtherapeutic at 0.28. - cbc stable - no bleeding documented   Goal of Therapy:  Heparin level 0.3-0.7 units/ml Monitor platelets by anticoagulation protocol: Yes   Plan:  - Increase heparin drip to 1100 units/hr - check 8 hr heparin level - monitor for s/sx bleeding   Dia Sitter P 04/23/2022,8:24 AM

## 2022-04-23 NOTE — Progress Notes (Signed)
PROGRESS NOTE  [name]  DOB: 18-Jun-1945  PCP: Lorrene Reid, PA-C IOE:[id number]  DOA: 04/22/2022  LOS: 1 day  Hospital Day: 2  Brief narrative: Alan Hurst is a 76 y.o. male with PMH significant for DM2, HTN, HLD, COPD, history of lung cancer-treated 12/13, patient presented to the ED for shortness of breath, productive cough, intermittent midsternal chest pain for a week.  Also reports bilateral lower extremity swelling (Rt >> Lt).he has been using his albuterol metered-dose inhaler without any significant improvement in his symptoms. 12/12, he was seen at the urgent care clinic and was prescribed a steroid inhaler and Mucinex but did not feel relief.  In the ED, patient was afebrile, tachycardic to 130s, blood pressure in 150s, required 3 L oxygen manage cannula Initial labs with unremarkable CBC, potassium low at 3.1, renal function normal, BNP elevated to 925, troponin elevated 522, lactic acid elevated to 2 CT angio chest did not show evidence of pulm embolism but showed stable right apical consolidation consistent with treated lung cancer. EKG showed sinus tachycardia. Admitted to [facility]   Subjective: Patient was seen and examined this morning.  Pleasant elderly Caucasian male.  Propped up in bed.  On low-flow oxygen.  Feels better than at presentation. Chart reviewed Remains afebrile, heart rate elevated to 120s overnight, blood pressure stable, on 2 L oxygen by nasal cannula Labs this morning with troponin downtrending to 3.1 potassium level normal  Assessment and plan: Acute exacerbation of COPD Known history of COPD, presented with progressive worsening respiratory symptoms. No evidence of pneumonia on chest imaging. Clinical had wheezing and hence he was started on systemic and inhaled steroids Continue bronchodilators, Mucinex. Albuterol was replaced with Xopenex because of tachycardia Oxygen as needed.  Check ambulatory oxygen requirement Follows up with  pulmonologist Dr. Josefina Do as at [facility]  Lab 04/22/22 1945 04/23/22 0700  WBC 7.6 6.7  LATICACIDVEN 2.0*  --    NSTEMI (non-ST elevated myocardial infarction)  Most likely secondary to demand ischemia from tachycardia and acute COPD exacerbation Patient's cardiac risk factors include hypertension and diabetes mellitus On admission, patient was started on heparin drip. Echocardiogram pending to rule out wall motion abnormality Continue aspirin and statin.  Metoprolol was added as well on admission Recent Labs    04/22/22 1945 04/22/22 2258 04/23/22 0700  TROPONINIHS 522* 506* 300*   Lactic acidosis No evidence of an acute infection at this time Trend lactic acid level.   Hypokalemia Potassium level improved with replacement. Recent Labs  Lab 04/22/22 1945 04/22/22 2258 04/23/22 0700  K 3.1*  --  4.0  MG  --  2.3  --    Essential hypertension Continue lisinopril and metoprolol  Type 2 diabetes mellitus A1c 6.4 on 11/20/2021 PTA on metformin which is currently on hold Continue slight scale insulin with Accu-Cheks Recent Labs  Lab 04/23/22 0831 04/23/22 1205  GLUCAP 156* 156*  156*    GERD (gastroesophageal reflux disease) Continue PPI  History of lung cancer Had radiation.  In remission.  Follows up with Dr. Baird Cancer at [place] of care   Code Status: Full Code    Mobility: Encourage ambulation  Scheduled Meds:  abiraterone acetate  1,000 mg Oral QAC breakfast   aspirin EC  81 mg Oral Daily   benzonatate  100 mg Oral TID   budesonide (PULMICORT) nebulizer solution  0.25 mg Nebulization BID   cholecalciferol  2,000 Units Oral Daily   [START ON  04/24/2022] influenza vaccine adjuvanted  0.5 mL Intramuscular Tomorrow-1000   insulin aspart  0-15 Units Subcutaneous TID WC   ipratropium-albuterol  3 mL Nebulization Once   lisinopril  2.5 mg Oral Daily   magnesium oxide  400 mg Oral Daily   melatonin  5 mg Oral QHS   metoprolol  tartrate  25 mg Oral BID   omega-3 acid ethyl esters  1 g Oral Daily   pantoprazole  40 mg Oral Daily   pravastatin  40 mg Oral q1800   [START ON 04/24/2022] predniSONE  40 mg Oral Q breakfast   sodium chloride flush  3 mL Intravenous Q12H    PRN meds: sodium chloride, acetaminophen **OR** acetaminophen, alum & mag hydroxide-simeth, guaiFENesin-dextromethorphan, levalbuterol, ondansetron **OR** ondansetron (ZOFRAN) IV, sodium chloride flush   Infusions:   sodium chloride     heparin 1,100 Units/hr (04/23/22 0931)    Skin assessment:     Nutritional status:  Body mass index is 27.32 kg/m.          Diet:  Diet Order             Diet Carb Modified Fluid consistency: Thin; Room service appropriate? Yes  Diet effective now                   DVT prophylaxis: Currently on heparin drip    Antimicrobials: Lagevrio Fluid: None Consultants: None Family Communication: None at bedside  Status is: Inpatient  Continue in-hospital care because: On IV heparin drip, on treatment for COVID Level of care: Progressive   Dispo: The patient is from: Home              Anticipated d/c is to: Pending clinical course              Patient currently is not medically stable to d/c.   Difficult to place patient No    Antimicrobials: Anti-infectives (From admission, onward)    None       Objective: Vitals:   04/23/22 1124 04/23/22 1541  BP:  135/78  Pulse:  (!) 108  Resp:  20  Temp: 97.9 F (36.6 C) 98.2 F (36.8 C)  SpO2:  94%    Intake/Output Summary (Last 24 hours) at 04/23/2022 1600 Last data filed at 04/22/2022 1923 Gross per 24 hour  Intake 50 ml  Output --  Net 50 ml   Filed Weights   04/22/22 2326  Weight: 83.9 kg   Weight change:  Body mass index is 27.32 kg/m.   Physical Exam: General exam: Pleasant, elderly Caucasian male.  Not in physical distress Skin: No rashes, lesions or ulcers. HEENT: Atraumatic, normocephalic, no obvious  bleeding Lungs: Clear to auscultation bilaterally except for mild cough on deep breathing CVS: Regular rate and rhythm, no murmur GI/Abd soft, nontender, nondistended, bowel sound present CNS: Alert, awake, oriented x 3 Psychiatry: Mood appropriate Extremities: No pedal edema, no calf tenderness  Data Review: I have personally reviewed the laboratory data and studies available.  F/u labs ordered Unresulted Labs (From admission, onward)     Start     Ordered   04/24/22 0500  CBC  Daily,   R      04/22/22 2318   04/24/22 0500  CBC with Differential/Platelet  Tomorrow morning,   R        04/23/22 0833   04/24/22 6767  Basic metabolic panel  Tomorrow morning,   R        04/23/22 0833   04/24/22  0500  Lactic acid, plasma  Tomorrow morning,   R        04/23/22 0833   04/23/22 1800  Heparin level (unfractionated)  Once-Timed,   TIMED        04/23/22 0921            Total time spent in review of labs and imaging, patient evaluation, formulation of plan, documentation and communication with family -21 minutes  Signed, Terrilee Croak, MD [facility] 04/23/2022

## 2022-04-23 NOTE — Progress Notes (Signed)
  Echocardiogram 2D Echocardiogram has been performed.  Alan Hurst 04/23/2022, 11:32 AM

## 2022-04-24 ENCOUNTER — Encounter (HOSPITAL_COMMUNITY): Payer: Self-pay | Admitting: Internal Medicine

## 2022-04-24 ENCOUNTER — Other Ambulatory Visit: Payer: Self-pay

## 2022-04-24 DIAGNOSIS — I21A1 Myocardial infarction type 2: Secondary | ICD-10-CM | POA: Diagnosis not present

## 2022-04-24 DIAGNOSIS — J441 Chronic obstructive pulmonary disease with (acute) exacerbation: Secondary | ICD-10-CM | POA: Diagnosis not present

## 2022-04-24 DIAGNOSIS — I428 Other cardiomyopathies: Secondary | ICD-10-CM | POA: Diagnosis not present

## 2022-04-24 LAB — GLUCOSE, CAPILLARY
Glucose-Capillary: 142 mg/dL — ABNORMAL HIGH (ref 70–99)
Glucose-Capillary: 151 mg/dL — ABNORMAL HIGH (ref 70–99)
Glucose-Capillary: 154 mg/dL — ABNORMAL HIGH (ref 70–99)
Glucose-Capillary: 142 mg/dL — ABNORMAL HIGH (ref 70–99)

## 2022-04-24 LAB — CBC WITH DIFFERENTIAL/PLATELET
Abs Immature Granulocytes: 0.14 10*3/uL — ABNORMAL HIGH (ref 0.00–0.07)
Basophils Absolute: 0 10*3/uL (ref 0.0–0.1)
Basophils Relative: 0 %
Eosinophils Absolute: 0 10*3/uL (ref 0.0–0.5)
Eosinophils Relative: 0 %
HCT: 34.6 % — ABNORMAL LOW (ref 39.0–52.0)
Hemoglobin: 11.2 g/dL — ABNORMAL LOW (ref 13.0–17.0)
Immature Granulocytes: 1 %
Lymphocytes Relative: 7 %
Lymphs Abs: 1 10*3/uL (ref 0.7–4.0)
MCH: 27.5 pg (ref 26.0–34.0)
MCHC: 32.4 g/dL (ref 30.0–36.0)
MCV: 85 fL (ref 80.0–100.0)
Monocytes Absolute: 1 10*3/uL (ref 0.1–1.0)
Monocytes Relative: 6 %
Neutro Abs: 13.3 10*3/uL — ABNORMAL HIGH (ref 1.7–7.7)
Neutrophils Relative %: 86 %
Platelets: 291 10*3/uL (ref 150–400)
RBC: 4.07 MIL/uL — ABNORMAL LOW (ref 4.22–5.81)
RDW: 15 % (ref 11.5–15.5)
WBC: 15.4 10*3/uL — ABNORMAL HIGH (ref 4.0–10.5)
nRBC: 0 % (ref 0.0–0.2)

## 2022-04-24 LAB — LACTIC ACID, PLASMA: Lactic Acid, Venous: 1.1 mmol/L (ref 0.5–1.9)

## 2022-04-24 LAB — BASIC METABOLIC PANEL
Anion gap: 9 (ref 5–15)
BUN: 34 mg/dL — ABNORMAL HIGH (ref 8–23)
CO2: 23 mmol/L (ref 22–32)
Calcium: 9 mg/dL (ref 8.9–10.3)
Chloride: 107 mmol/L (ref 98–111)
Creatinine, Ser: 1.02 mg/dL (ref 0.61–1.24)
GFR, Estimated: >60 mL/min (ref >60)
Glucose, Bld: 151 mg/dL — ABNORMAL HIGH (ref 70–99)
Potassium: 4.5 mmol/L (ref 3.5–5.1)
Sodium: 139 mmol/L (ref 135–145)

## 2022-04-24 LAB — TROPONIN I (HIGH SENSITIVITY)
Troponin I (High Sensitivity): 179 ng/L (ref <18)
Troponin I (High Sensitivity): 136 ng/L (ref <18)

## 2022-04-24 LAB — HEPARIN LEVEL (UNFRACTIONATED)
Heparin Unfractionated: 0.43 IU/mL (ref 0.30–0.70)
Heparin Unfractionated: 0.28 IU/mL — ABNORMAL LOW (ref 0.30–0.70)

## 2022-04-24 MED ORDER — BUDESONIDE 0.25 MG/2ML IN SUSP
0.2500 mg | Freq: Two times a day (BID) | RESPIRATORY_TRACT | Status: DC
  Administered 2022-04-24 – 2022-04-28 (×7): 0.25 mg via RESPIRATORY_TRACT
  Filled 2022-04-24 (×8): qty 2

## 2022-04-24 MED ORDER — HEPARIN (PORCINE) 25000 UT/250ML-% IV SOLN
1300.0000 [IU]/h | INTRAVENOUS | Status: DC
  Administered 2022-04-24 – 2022-04-27 (×4): 1300 [IU]/h via INTRAVENOUS
  Filled 2022-04-24 (×4): qty 250

## 2022-04-24 MED ORDER — AZITHROMYCIN 250 MG PO TABS
250.0000 mg | ORAL_TABLET | ORAL | Status: DC
  Administered 2022-04-24 – 2022-04-28 (×3): 250 mg via ORAL
  Filled 2022-04-24 (×3): qty 1

## 2022-04-24 MED ORDER — HEPARIN (PORCINE) 25000 UT/250ML-% IV SOLN
1250.0000 [IU]/h | INTRAVENOUS | Status: DC
  Administered 2022-04-24: 1250 [IU]/h via INTRAVENOUS

## 2022-04-24 MED ORDER — UMECLIDINIUM BROMIDE 62.5 MCG/ACT IN AEPB
1.0000 | INHALATION_SPRAY | Freq: Every day | RESPIRATORY_TRACT | Status: DC
  Administered 2022-04-24 – 2022-04-28 (×4): 1 via RESPIRATORY_TRACT
  Filled 2022-04-24: qty 7

## 2022-04-24 MED ORDER — ARFORMOTEROL TARTRATE 15 MCG/2ML IN NEBU
15.0000 ug | INHALATION_SOLUTION | Freq: Two times a day (BID) | RESPIRATORY_TRACT | Status: DC
  Administered 2022-04-24 – 2022-04-27 (×7): 15 ug via RESPIRATORY_TRACT
  Filled 2022-04-24 (×13): qty 2

## 2022-04-24 MED ORDER — METHYLPREDNISOLONE SODIUM SUCC 40 MG IJ SOLR
40.0000 mg | Freq: Two times a day (BID) | INTRAMUSCULAR | Status: DC
  Administered 2022-04-24 – 2022-04-25 (×2): 40 mg via INTRAVENOUS
  Filled 2022-04-24 (×2): qty 1

## 2022-04-24 NOTE — Progress Notes (Signed)
PROGRESS NOTE  Alan Hurst  DOB: 1946/04/07  PCP: Alan Reid, PA-C ION:[id number]  DOA: 04/22/2022  LOS: 2 days  Hospital Day: 3  Brief narrative: Alan Hurst is a 76 y.o. male with PMH significant for DM2, HTN, HLD, COPD, history of lung Hurst-treated 12/13, patient presented to the ED for shortness of breath, productive cough, intermittent midsternal chest pain for a week.  Also reports bilateral lower extremity swelling (Rt >> Lt).he has been using his albuterol metered-dose inhaler without any significant improvement in his symptoms. 12/12, he was seen at the urgent care clinic and was prescribed a steroid inhaler and Mucinex but did not feel relief.  In the ED, patient was afebrile, tachycardic to 130s, blood pressure in 150s, required 3 L oxygen manage cannula Initial labs with unremarkable CBC, potassium low at 3.1, renal function normal, BNP elevated to 925, troponin elevated 522, lactic acid elevated to 2 CT angio chest did not show evidence of pulm embolism but showed stable right apical consolidation consistent with treated lung Hurst. EKG showed sinus tachycardia. Admitted to [facility]   Subjective: Patient was seen and examined this morning.   Earlier this morning, I saw that troponin had trended down and I stopped heparin drip. For the time of my evaluation, patient does not feel better than yesterday.  Had a bad episode of chest pain this morning.  He thinks it is indigestion and is getting Protonix and Maalox.   He took off oxygen for few minutes and felt worse.  Assessment and plan: Acute exacerbation of COPD Known history of COPD, presented with progressive worsening respiratory symptoms. No evidence of pneumonia on chest imaging. Clinically had wheezing and hence he was started on systemic and inhaled steroids I would keep him on IV Solu-Medrol 40 mg twice daily today. PTA, he was on prednisone 5 mg daily, beclomethasone inhalational twice daily, Stiolto  twice daily as well as azithromycin 250 mg every other day. Resume bronchodilators and antibiotics. Continue bronchodilators, Mucinex. Albuterol was replaced with Xopenex because of tachycardia Not on antibiotics.  Lactic acid normalized.  WBC count up this morning probably because of steroids. Recent Labs  Lab 04/22/22 1945 04/23/22 0700 04/24/22 0445  WBC 7.6 6.7 15.4*  LATICACIDVEN 2.0*  --  1.1   Acute respiratory failure with hypoxia Currently requiring 2 to 3 L oxygen to maintain O2 sat.  Not on supplemental oxygen at home. Check ambulatory oxygen requirement Follows up with pulmonologist Alan Hurst as at [facility]  NSTEMI (non-ST elevated myocardial infarction)  New drop in EF Patient complained of chest pain on admission.  Troponin was elevated.  Chest pain and troponin were initially suspected to be secondary to demand ischemia from tachycardia and acute COPD exacerbation Patient's cardiac risk factors include hypertension and diabetes mellitus On admission, patient was started on heparin drip. Echocardiogram obtained yesterday showed a new drop in EF to 40 to 45% but could not comment on wall motion abnormality Troponin down trended as below.  I stopped heparin drip this morning However at the time of my evaluation, patient is complaining of chest pain again.  He is tachycardic. May need inpatient ischemic evaluation.  Will get cardiology consultation. Continue aspirin and statin.  Metoprolol was added as well on admission Recent Labs    04/22/22 1945 04/22/22 2258 04/23/22 0700 04/24/22 0445  TROPONINIHS 522* 506* 300* 179*   GERD PTA on Prilosec Continue the same  Hypokalemia Potassium level improved with replacement. Recent Labs  Lab 04/22/22  1945 04/22/22 2258 04/23/22 0700 04/24/22 0445  K 3.1*  --  4.0 4.5  MG  --  2.3  --   --    Essential hypertension Continue lisinopril and metoprolol  Controlled type 2 diabetes mellitus Hyperglycemia due to  steroids A1c 6.4 on 11/20/2021 PTA on metformin which is currently on hold Continue sliding scale insulin with Accu-Cheks Recent Labs  Lab 04/23/22 0831 04/23/22 1205 04/23/22 1656 04/23/22 2137 04/24/22 0746  GLUCAP 156* 156*  156* 140* 214* 142*    GERD (gastroesophageal reflux disease) Continue PPI  History of lung Hurst Had radiation.  In remission.  Follows up with Alan Hurst at [place] of care   Code Status: Full Code    Mobility: Encourage ambulation  Scheduled Meds:  abiraterone acetate  1,000 mg Oral QAC breakfast   arformoterol  15 mcg Nebulization BID   And   umeclidinium bromide  1 puff Inhalation Daily   aspirin EC  81 mg Oral Daily   azithromycin  250 mg Oral QODAY   benzonatate  100 mg Oral TID   budesonide (PULMICORT) nebulizer solution  0.25 mg Nebulization BID   cholecalciferol  2,000 Units Oral Daily   influenza vaccine adjuvanted  0.5 mL Intramuscular Tomorrow-1000   insulin aspart  0-15 Units Subcutaneous TID WC   levalbuterol  0.63 mg Nebulization TID   lisinopril  2.5 mg Oral Daily   magnesium oxide  400 mg Oral Daily   melatonin  5 mg Oral QHS   methylPREDNISolone (SOLU-MEDROL) injection  40 mg Intravenous Q12H   metoprolol tartrate  25 mg Oral BID   omega-3 acid ethyl esters  1 g Oral Daily   pantoprazole  40 mg Oral Daily   pravastatin  40 mg Oral q1800   sodium chloride flush  3 mL Intravenous Q12H    PRN meds: sodium chloride, acetaminophen **OR** acetaminophen, alum & mag hydroxide-simeth, guaiFENesin-dextromethorphan, levalbuterol, ondansetron **OR** ondansetron (ZOFRAN) IV, sodium chloride flush   Infusions:   sodium chloride     heparin      Skin assessment:     Nutritional status:  Body mass index is 27.32 kg/m.          Diet:  Diet Order             Diet Carb Modified Fluid consistency: Thin; Room service appropriate? Yes  Diet effective now                   DVT prophylaxis: Currently on  heparin drip    Antimicrobials: None Fluid: None Consultants: None Family Communication: None at bedside  Status is: Inpatient  Continue in-hospital care because: Unable to discharge today because of new recurrence of chest pain. Level of care: Progressive   Dispo: The patient is from: Home              Anticipated d/c is to: Pending clinical course              Patient currently is not medically stable to d/c.   Difficult to place patient No    Antimicrobials: Anti-infectives (From admission, onward)    Start     Dose/Rate Route Frequency Ordered Stop   04/24/22 1145  azithromycin (ZITHROMAX) tablet 250 mg        250 mg Oral Every other day 04/24/22 1046         Objective: Vitals:   04/24/22 0905 04/24/22 1033  BP:  126/77  Pulse:  Marland Kitchen)  105  Resp: 17 14  Temp:    SpO2: 98% 97%    Intake/Output Summary (Last 24 hours) at 04/24/2022 1051 Last data filed at 04/24/2022 1038 Gross per 24 hour  Intake 904.89 ml  Output 750 ml  Net 154.89 ml   Filed Weights   04/22/22 2326  Weight: 83.9 kg   Weight change:  Body mass index is 27.32 kg/m.   Physical Exam: General exam: Pleasant, elderly Caucasian male.  Not in physical distress Skin: No rashes, lesions or ulcers. HEENT: Atraumatic, normocephalic, no obvious bleeding Lungs: Clear to auscultation bilaterally except for mild cough on deep breathing CVS: Regular rate and rhythm, no murmur GI/Abd soft, nontender, nondistended, bowel sound present CNS: Alert, awake, oriented x 3 Psychiatry: Mood appropriate Extremities: No pedal edema, no calf tenderness  Data Review: I have personally reviewed the laboratory data and studies available.  F/u labs ordered Unresulted Labs (From admission, onward)     Start     Ordered   04/24/22 1300  Heparin level (unfractionated)  Once-Timed,   TIMED       Question:  Specimen collection method  Answer:  Lab=Lab collect   04/24/22 0533   04/24/22 0500  CBC  Daily,   R       04/22/22 2318            Total time spent in review of labs and imaging, patient evaluation, formulation of plan, documentation and communication with family -65 minutes  Signed, Terrilee Croak, MD [facility] 04/24/2022

## 2022-04-24 NOTE — Progress Notes (Signed)
Mobility Specialist - Progress Note  Pre-mobility: 76 bpm HR, 98% SpO2 During mobility: 92 bpm HR, 96% SpO2 Post-mobility: 88 bpm HR, 96% SPO2   04/24/22 1529  Oxygen Therapy  O2 Device Nasal Cannula  O2 Flow Rate (L/min) 2 L/min  Mobility  Activity Ambulated with assistance in hallway  Level of Assistance Standby assist, set-up cues, supervision of patient - no hands on  Assistive Device Other (Comment) (Hallway Rails)  Distance Ambulated (ft) 150 ft  Range of Motion/Exercises Active  Activity Response Tolerated well  Mobility Referral Yes  $Mobility charge 1 Mobility   Pt was found in bed and agreeable to ambulate. Pt stated not using a RW and used the hallway rails to help steady himself. Had no complaints during session and at EOS stated wanting to ambulate again. Was left in bed with necessities in reach and RN notified of session.  Ferd Hibbs Mobility Specialist

## 2022-04-24 NOTE — Progress Notes (Addendum)
ANTICOAGULATION CONSULT NOTE - Follow Up Consult  Pharmacy Consult for heparin  Indication: NSTEMI  Allergies  Allergen Reactions   No Known Allergies    Lemon Oil Rash    Patient Measurements: Height: 5\' 9"  (175.3 cm) Weight: 83.9 kg (185 lb) IBW/kg (Calculated) : 70.7 Heparin Dosing Weight: 84 kg  Vital Signs: Temp: 98.1 F (36.7 C) (12/15 1305) Temp Source: Oral (12/15 1305) BP: 129/77 (12/15 1305) Pulse Rate: 102 (12/15 1305)  Labs: Recent Labs    04/22/22 1945 04/22/22 2258 04/23/22 0700 04/23/22 0800 04/23/22 1859 04/24/22 0445 04/24/22 1142 04/24/22 1939  HGB 11.6*  --  11.0*  --   --  11.2*  --   --   HCT 36.5*  --  33.7*  --   --  34.6*  --   --   PLT 230  --  239  --   --  291  --   --   APTT  --  35  --   --   --   --   --   --   LABPROT  --  14.5  --   --   --   --   --   --   INR  --  1.1  --   --   --   --   --   --   HEPARINUNFRC  --   --   --    < > 0.24* 0.43  --  0.28*  CREATININE 0.90  --  1.01  --   --  1.02  --   --   TROPONINIHS 522* 506* 300*  --   --  179* 136*  --    < > = values in this interval not displayed.     Estimated Creatinine Clearance: 61.6 mL/min (by C-G formula based on SCr of 1.02 mg/dL).   Assessment: Patient is a 76 y.o M with hx COPD who presented to the ED on 04/22/22 with c/o cough,chest tightness and SOB.  In the ED, he was found to be tachycardic with elevated troponin.  He's currently on heparin drip for NSTEMI.  Today, 04/24/2022: heparin level slightly subtherapeutic (0.28) on 1250 units/hr --Note, drip turned off around 8:25 this morning per MD then resumed around 12 noon without a bolus CBC stable No line issues or bleeding reported per RN  Goal of Therapy:  Heparin level 0.3-0.7 units/ml Monitor platelets by anticoagulation protocol: Yes   Plan:  - Increase heparin drip slightly to 1300 units/hr - recheck heparin level in 8 hr - monitor for s/sx bleeding    Peggyann Juba, PharmD, BCPS Pharmacy:  [phone] 04/24/2022, 8:02 PM

## 2022-04-24 NOTE — Consult Note (Signed)
Cardiology Consultation   Patient ID: Alan Hurst MRN: [id number]; DOB: [birth year]  Admit date: 04/22/2022 Date of Consult: 04/24/2022  PCP:  Alan Hurst, [place] [facility] Cardiologist:  None        Patient Profile:   Alan Hurst is a 76 y.o. 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 Pietro Hurst.   History of Present Illness:   Alan Hurst has no prior cardiac history. He recalls having a stress test with Alan Hurst 30 years ago. He quit smoking in 1995. He states he was previously a hippie and smoked dope remotely but it's been decades. He also remains a recovering alcoholic, sober for 34 years. His dad had CHF but otherwise no significant family hx of CAD.  He reports for the past 6 months he's been noticing worsening SOB as well as sensation of chest burning with exertion. This would specifically happen if he pushed himself a little extra beyond normal activity, like walking the trash cans up the hill. He attributed this to his lung disease so would take a puff on his inhaler, sit down and rest, and symptoms would improve.   However, the last 9 days he has had cough productive of white/clear phlegm. He went to urgent care and was trialed on steroid, inhaler and cough syrup. However, symptoms worsened with increased SOB/DOE and he borrowed his brother's O2 as he began wheezing more than before. His symptoms progressed so EMS was called on 04/22/22. He does not know if his initial O2 sat was low but he was started on oxygen immediately. He notes that evening he also had had  about 10 minutes of chest burning relieved with Maalox. He had been off his omeprazole for a few days.  Initial labs notable for BNP 925, hsTroponin 522->506->300->179, hypokalemia of 3.1,Mg wnl, albumin 3.1, lactic acidosis 2.0, mild anemia, d-dimer 0.86, covid/flu/RSV panel. CXR shows postradiation scarring in the superomedial right lung, unchanged, no acute process or significant change from prior. CTA/PE study showed no evidence of PE, + small right pleural effusion, stable right apical consolidation c/w treated lung CA, aortic atherosclerosis, emphysema, + coronary atherosclerosis most pronounced in LAD territory. 2D echo obtained yesterday showed EF 40-45%, challenging acquisition and unable to determine if WMA present, rtivial MR, normal IVC, normal RV, normal PASP. This morning he had another episode of chest pain, described as burning, lasting about 10 minutes and relieved with Maalox again. EKGs have shown sinus tach but no acute STT changes. Denies edema, orthopnea, or major weight change. He was initially treated with IV heparin, stopped after troponins were downtrending, then restarted per d/w IM given recurrent CP. He is on 2L/min today.   Past Medical History:  Diagnosis Date   Adenocarcinoma of lung, stage 1 (HCC)    Alcoholism in recovery ([place])    Chronic kidney disease, stage 2 (mild)    COPD (chronic obstructive pulmonary disease) ([place])    Diabetes ([place])    Ectatic abdominal aorta (HCC)    Femur fracture, right (HCC)    GERD (gastroesophageal reflux disease) 12/05/2015   Well controlled on meds   H/O: substance abuse (  [place]) 1999   Hip fracture ([place])    HLD (hyperlipidemia) 12/05/2015   10+ yrs at least.    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   [facility]   Sleep disorder 12/05/2015   Vitamin B deficiency 12/05/2015   Vitamin D insufficiency 12/05/2015    Past Surgical History:  Procedure Laterality Date   CATARACT EXTRACTION,  BILATERAL  10/2016   COLONOSCOPY     FEMUR FRACTURE SURGERY     HIP FRACTURE SURGERY     JOINT REPLACEMENT Bilateral    hips   TIBIA FRACTURE SURGERY     TOTAL HIP REVISION Right 08/07/2016   Procedure: RIGHT TOTAL HIP REVISION;  Surgeon: Alan Can, MD;  Location: [facility];  Service: Orthopedics;  Laterality: Right;     Home Medications:  Prior to Admission medications   Medication Sig Start Date End Date Taking? Authorizing Provider  abiraterone acetate (ZYTIGA) 250 MG tablet Take 1,000 mg by mouth daily. 09/16/20  Yes [provider]  albuterol (VENTOLIN HFA) 108 (90 Base) MCG/ACT inhaler Inhale 2 puffs into the lungs every 6 (six) hours as needed for wheezing or shortness of breath. 10/30/21  Yes [provider]  aspirin EC 81 MG tablet Take 81 mg by mouth daily.   Yes [provider]  azithromycin (ZITHROMAX) 250 MG tablet Take 250 mg by mouth every other day. 04/18/22  Yes [provider]  beclomethasone (QVAR REDIHALER) 40 MCG/ACT inhaler Inhale 1 puff into the lungs 2 (two) times daily. 04/21/22  Yes Mound, Hildred Alamin E, FNP  Cholecalciferol (VITAMIN D) 2000 units tablet Take 1 tablet by mouth daily. 04/25/13  Yes [provider]  ciclopirox (PENLAC) 8 % solution Apply topically at bedtime. Apply over nail and surrounding skin. Apply daily over previous coat. After seven (7) days, may remove with alcohol and continue cycle. 03/03/22  Yes Abonza, Maritza, PA-C  fluticasone (FLONASE) 50 MCG/ACT nasal spray Place 1 spray into both nostrils daily. Patient taking differently: Place 1 spray into both nostrils daily as needed for allergies. 11/20/21  Yes Abonza, Maritza, PA-C  gabapentin (NEURONTIN) 100 MG capsule Take 300 mg by mouth at bedtime. 04/20/22  Yes [provider]  guaiFENesin 200 MG tablet Take 1 tablet (200 mg total) by mouth every 4 (four) hours as needed for cough or to loosen phlegm. 04/21/22  Yes Mound, Hildred Alamin E, FNP  lisinopril  (ZESTRIL) 2.5 MG tablet TAKE 1 TABLET BY MOUTH DAILY 01/23/22  Yes Boscia, Heather E, NP  lovastatin (MEVACOR) 40 MG tablet TAKE 1 TABLET BY MOUTH AT  BEDTIME 01/23/22  Yes Boscia, Heather E, NP  magnesium oxide (MAG-OX) 400 MG tablet Take 400 mg by mouth daily.   Yes [provider]  melatonin 5 MG TABS Take 5 mg by mouth at bedtime.   Yes [provider]  metFORMIN (GLUCOPHAGE) 500 MG tablet Take 1 tablet (500 mg total) by mouth daily. 11/20/21  Yes Abonza, Herb Grays, PA-C  Omega-3 Fatty Acids (FISH OIL) 500 MG CAPS Take 500 mg by mouth daily. 04/25/13  Yes [provider]  omeprazole (PRILOSEC) 20 MG capsule TAKE 1 CAPSULE BY MOUTH DAILY 02/18/22  Yes Abonza, Maritza, PA-C  potassium chloride (KLOR-CON) 10 MEQ tablet Take 10 mEq by mouth daily.   Yes [provider]  predniSONE (DELTASONE) 5 MG tablet Take 5 mg by mouth daily. 09/16/20  Yes [provider]  Arnold Line 2.5-2.5 MCG/ACT AERS Inhale 2 each  into the lungs daily. 01/20/22  Yes [provider]  vitamin B-12 (CYANOCOBALAMIN) 500 MCG tablet Take 500 mcg by mouth daily.   Yes [provider]    Inpatient Medications: Scheduled Meds:  abiraterone acetate  1,000 mg Oral QAC breakfast   arformoterol  15 mcg Nebulization BID   And   umeclidinium bromide  1 puff Inhalation Daily   aspirin EC  81 mg Oral Daily   azithromycin  250 mg Oral QODAY   benzonatate  100 mg Oral TID   budesonide (PULMICORT) nebulizer solution  0.25 mg Nebulization BID   cholecalciferol  2,000 Units Oral Daily   influenza vaccine adjuvanted  0.5 mL Intramuscular Tomorrow-1000   insulin aspart  0-15 Units Subcutaneous TID WC   lisinopril  2.5 mg Oral Daily   magnesium oxide  400 mg Oral Daily   melatonin  5 mg Oral QHS   methylPREDNISolone (SOLU-MEDROL) injection  40 mg Intravenous Q12H   metoprolol tartrate  25 mg Oral BID   omega-3 acid ethyl esters  1 g Oral Daily   pantoprazole  40 mg Oral  Daily   pravastatin  40 mg Oral q1800   sodium chloride flush  3 mL Intravenous Q12H   Continuous Infusions:  sodium chloride     heparin     PRN Meds: sodium chloride, acetaminophen **OR** acetaminophen, alum & mag hydroxide-simeth, guaiFENesin-dextromethorphan, levalbuterol, ondansetron **OR** ondansetron (ZOFRAN) IV, sodium chloride flush  Allergies:    Allergies  Allergen Reactions   No Known Allergies    Lemon Oil Rash    Social History:   Social History   Socioeconomic History   Marital status: Married    Spouse name: Marcie Bal   Number of children: 0   Years of education: Not on file   Highest education level: Not on file  Occupational History   Occupation: semi retired  Tobacco Use   Smoking status: Former    Packs/day: 1.50    Years: 32.00    Total pack years: 48.00    Types: Cigarettes    Start date: 05/11/1961    Quit date: 05/11/1993    Years since quitting: 28.9   Smokeless tobacco: Never  Vaping Use   Vaping Use: Never used  Substance and Sexual Activity   Alcohol use: Not Currently   Drug use: No   Sexual activity: Not Currently    Birth control/protection: None  Other Topics Concern   Not on file  Social History Narrative   Not on file   Social Determinants of Health   Financial Resource Strain: Medium Risk (11/20/2021)   Overall Financial Resource Strain (CARDIA)    Difficulty of Paying Living Expenses: Somewhat hard  Food Insecurity: No Food Insecurity (04/23/2022)   Hunger Vital Sign    Worried About Running Out of Food in the Last Year: Never true    [place] in the Last Year: Never true  Transportation Needs: No Transportation Needs (04/23/2022)   PRAPARE - Hydrologist (Medical): No    Lack of Transportation (Non-Medical): No  Physical Activity: Insufficiently Active (11/20/2021)   Exercise Vital Sign    Days of Exercise per Week: 2 days    Minutes of Exercise per Session: 20 min  Stress: Stress Concern  Present (11/20/2021)   [place]    Feeling of Stress : To some extent  Social Connections: Moderately Integrated (11/20/2021)   Social Connection and  Isolation Panel [NHANES]    Frequency of Communication with Friends and Family: More than three times a week    Frequency of Social Gatherings with Friends and Family: Twice a week    Attends Religious Services: Never    Marine scientist or Organizations: Yes    Attends Music therapist: More than 4 times per year    Marital Status: Married  Human resources officer Violence: Not At Risk (04/23/2022)   Humiliation, Afraid, Rape, and Kick questionnaire    Fear of Current or Ex-Partner: No    Emotionally Abused: No    Physically Abused: No    Sexually Abused: No    Family History:   Family History  Problem Relation Age of Onset   Alcohol abuse Mother    Alcohol abuse Father    Congestive Heart Failure Father    Kidney disease Father    Colon cancer Neg Hx    Esophageal cancer Neg Hx    Rectal cancer Neg Hx    Stomach cancer Neg Hx      ROS:  Please see the history of present illness.  All other ROS reviewed and negative.     Physical Exam/Data:   Vitals:   04/23/22 2352 04/24/22 0350 04/24/22 0905 04/24/22 1033  BP: 123/73 138/89  126/77  Pulse: 92 (!) 105  (!) 105  Resp:  15 17 14   Temp: 97.9 F (36.6 C) 98.1 F (36.7 C)    TempSrc: Oral Oral    SpO2: 100% 99% 98% 97%  Weight:      Height:        Intake/Output Summary (Last 24 hours) at 04/24/2022 1122 Last data filed at 04/24/2022 1038 Gross per 24 hour  Intake 904.89 ml  Output 750 ml  Net 154.89 ml      04/22/2022   11:26 PM 02/24/2022    3:22 PM 11/20/2021    9:32 AM  Last 3 Weights  Weight (lbs) 185 lb 180 lb 179 lb  Weight (kg) 83.915 kg 81.647 kg 81.194 kg     Body mass index is 27.32 kg/m.  Physical examination per MD  EKG:  The EKG was personally reviewed and  demonstrates:   Initial tracing: sinus tachycardia 135bpm, no acute STT changes Today: sinus tach 110bpm with first degree AVB, no acute STT changes  Telemetry:  Telemetry was personally reviewed and demonstrates:  Sinus tachycardia, rate 100-105 bpm  Relevant CV Studies: 2D Echo 04/23/22 IMPRESSIONS  1. Left ventricular ejection fraction, by estimation, is 40 to 45%. The  left ventricle has mildly decreased function. Left ventricular endocardial  border not optimally defined to evaluate regional wall motion.  Indeterminate diastolic filling due to E-A  fusion.   2. Right ventricular systolic function is normal. The right ventricular  size is normal. There is normal pulmonary artery systolic pressure.   3. The mitral valve is degenerative. Trivial mitral valve regurgitation.  No evidence of mitral stenosis.   4. The aortic valve is normal in structure. Aortic valve regurgitation is  not visualized. No aortic stenosis is present.   5. The inferior vena cava is normal in size with greater than 50%  respiratory variability, suggesting right atrial pressure of 3 mmHg.   Laboratory Data:  High Sensitivity Troponin:   Recent Labs  Lab 04/22/22 1945 04/22/22 2258 04/23/22 0700 04/24/22 0445  TROPONINIHS 522* 506* 300* 179*     Chemistry Recent Labs  Lab 04/22/22 1945 04/22/22 2258 04/23/22 0700 04/24/22 0445  NA 137  --  137 139  K 3.1*  --  4.0 4.5  CL 104  --  104 107  CO2 21*  --  23 23  GLUCOSE 168*  --  161* 151*  BUN 10  --  14 34*  CREATININE 0.90  --  1.01 1.02  CALCIUM 9.3  --  9.2 9.0  MG  --  2.3  --   --   GFRNONAA >60  --  >60 >60  ANIONGAP 12  --  10 9    Recent Labs  Lab 04/22/22 1945  PROT 6.7  ALBUMIN 3.1*  AST 31  ALT 19  ALKPHOS 60  BILITOT 0.8   Lipids   Hematology Recent Labs  Lab 04/22/22 1945 04/23/22 0700 04/24/22 0445  WBC 7.6 6.7 15.4*  RBC 4.24 4.07* 4.07*  HGB 11.6* 11.0* 11.2*  HCT 36.5* 33.7* 34.6*  MCV 86.1 82.8 85.0   MCH 27.4 27.0 27.5  MCHC 31.8 32.6 32.4  RDW 14.6 14.6 15.0  PLT 230 239 291   BNP Recent Labs  Lab 04/22/22 1945  BNP 925.5*    DDimer  Recent Labs  Lab 04/22/22 2258  DDIMER 0.86*     Radiology/Studies:  ECHOCARDIOGRAM COMPLETE  Result Date: 04/23/2022    ECHOCARDIOGRAM REPORT   Patient Name:   Alan Hurst Date of Exam: 04/23/2022 Medical Rec #:  [id number]       Height:       69.0 in Accession #:    [id number]      Weight:       185.0 lb Date of Birth:  1946/01/23       BSA:          1.999 m Patient Age:    43 years        BP:           140/95 mmHg Patient Gender: M               HR:           107 bpm. Exam Location:  Inpatient Procedure: 2D Echo and Intracardiac Opacification Agent Indications:    NSTEMI  History:        Patient has no prior history of Echocardiogram examinations.                 COPD; Risk Factors:Hypertension, Former Smoker and Diabetes.  Sonographer:    Harvie Junior Referring Phys: [id number]  Sonographer Comments: Technically difficult study due to poor echo windows, suboptimal parasternal window, suboptimal apical window and suboptimal subcostal window. Image acquisition challenging due to COPD and Image acquisition challenging due to respiratory motion. IMPRESSIONS  1. Left ventricular ejection fraction, by estimation, is 40 to 45%. The left ventricle has mildly decreased function. Left ventricular endocardial border not optimally defined to evaluate regional wall motion. Indeterminate diastolic filling due to E-A fusion.  2. Right ventricular systolic function is normal. The right ventricular size is normal. There is normal pulmonary artery systolic pressure.  3. The mitral valve is degenerative. Trivial mitral valve regurgitation. No evidence of mitral stenosis.  4. The aortic valve is normal in structure. Aortic valve regurgitation is not visualized. No aortic stenosis is present.  5. The inferior vena cava is normal in size with greater than 50%  respiratory variability, suggesting right atrial pressure of 3 mmHg. FINDINGS  Left Ventricle: Left ventricular ejection fraction, by estimation, is 40 to 45%. The left ventricle has mildly decreased function.  Left ventricular endocardial border not optimally defined to evaluate regional wall motion. Definity contrast agent was given IV to delineate the left ventricular endocardial borders. The left ventricular internal cavity size was normal in size. There is no left ventricular hypertrophy. Indeterminate diastolic filling due to E-A fusion. Right Ventricle: The right ventricular size is normal. No increase in right ventricular wall thickness. Right ventricular systolic function is normal. There is normal pulmonary artery systolic pressure. The tricuspid regurgitant velocity is 2.46 m/s, and  with an assumed right atrial pressure of 3 mmHg, the estimated right ventricular systolic pressure is 71.0 mmHg. Left Atrium: Left atrial size was normal in size. Right Atrium: Right atrial size was normal in size. Pericardium: There is no evidence of pericardial effusion. Mitral Valve: The mitral valve is degenerative in appearance. There is mild calcification of the anterior mitral valve leaflet(s). Mild to moderate mitral annular calcification. Trivial mitral valve regurgitation. No evidence of mitral valve stenosis. Tricuspid Valve: The tricuspid valve is normal in structure. Tricuspid valve regurgitation is trivial. No evidence of tricuspid stenosis. Aortic Valve: The aortic valve is normal in structure. Aortic valve regurgitation is not visualized. No aortic stenosis is present. Aortic valve mean gradient measures 2.0 mmHg. Aortic valve peak gradient measures 3.4 mmHg. Aortic valve area, by VTI measures 3.67 cm. Pulmonic Valve: The pulmonic valve was normal in structure. Pulmonic valve regurgitation is not visualized. No evidence of pulmonic stenosis. Aorta: The aortic root is normal in size and structure. Venous: The  inferior vena cava is normal in size with greater than 50% respiratory variability, suggesting right atrial pressure of 3 mmHg. IAS/Shunts: No atrial level shunt detected by color flow Doppler.  LEFT VENTRICLE PLAX 2D LVIDd:         4.60 cm      Diastology LVIDs:         4.10 cm      LV e' medial:    4.57 cm/s LV PW:         1.00 cm      LV E/e' medial:  27.8 LV IVS:        0.90 cm      LV e' lateral:   18.50 cm/s LVOT diam:     2.30 cm      LV E/e' lateral: 6.9 LV SV:         66 LV SV Index:   33 LVOT Area:     4.15 cm  LV Volumes (MOD) LV vol d, MOD A2C: 113.0 ml LV vol d, MOD A4C: 89.2 ml LV vol s, MOD A2C: 66.7 ml LV vol s, MOD A4C: 52.0 ml LV SV MOD A2C:     46.3 ml LV SV MOD A4C:     89.2 ml LV SV MOD BP:      42.8 ml RIGHT VENTRICLE RV Basal diam:  3.80 cm RV Mid diam:    3.40 cm TAPSE (M-mode): 2.2 cm LEFT ATRIUM             Index        RIGHT ATRIUM           Index LA diam:        3.60 cm 1.80 cm/m   RA Area:     13.50 cm LA Vol (A2C):   63.3 ml 31.67 ml/m  RA Volume:   30.00 ml  15.01 ml/m LA Vol (A4C):   36.9 ml 18.46 ml/m LA Biplane Vol: 49.9 ml 24.97 ml/m  AORTIC VALVE  AV Area (Vmax):    3.82 cm AV Area (Vmean):   3.20 cm AV Area (VTI):     3.67 cm AV Vmax:           92.70 cm/s AV Vmean:          73.100 cm/s AV VTI:            0.179 m AV Peak Grad:      3.4 mmHg AV Mean Grad:      2.0 mmHg LVOT Vmax:         85.30 cm/s LVOT Vmean:        56.300 cm/s LVOT VTI:          0.158 m LVOT/AV VTI ratio: 0.88  AORTA Ao Root diam: 3.40 cm MITRAL VALVE                TRICUSPID VALVE MV Area (PHT): 5.02 cm     TR Peak grad:   24.2 mmHg MV Decel Time: 151 msec     TR Vmax:        246.00 cm/s MR Peak grad: 21.8 mmHg MR Vmax:      233.67 cm/s   SHUNTS MV E velocity: 127.00 cm/s  Systemic VTI:  0.16 m MV A velocity: 33.50 cm/s   Systemic Diam: 2.30 cm MV E/A ratio:  3.79 Fransico Him MD Electronically signed by Fransico Him MD Signature Date/Time: 04/23/2022/1:22:41 PM    Final    CT Angio Chest Pulmonary  Embolism (PE) W or WO Contrast  Result Date: 04/22/2022 CLINICAL DATA:  Short of breath, cough, history of right lung cancer status post radiation therapy EXAM: CT ANGIOGRAPHY CHEST WITH CONTRAST TECHNIQUE: Multidetector CT imaging of the chest was performed using the standard protocol during bolus administration of intravenous contrast. Multiplanar CT image reconstructions and MIPs were obtained to evaluate the vascular anatomy. RADIATION DOSE REDUCTION: This exam was performed according to the departmental dose-optimization program which includes automated exposure control, adjustment of the mA and/or kV according to patient size and/or use of iterative reconstruction technique. CONTRAST:  37mL OMNIPAQUE IOHEXOL 350 MG/ML SOLN COMPARISON:  04/22/2022, 06/10/2021, 02/06/2022 FINDINGS: Cardiovascular: This is a technically adequate evaluation of the pulmonary vasculature. No filling defects or pulmonary emboli. The heart is unremarkable without pericardial effusion. Coronary artery atherosclerosis most pronounced within the LAD distribution. No evidence of thoracic aortic aneurysm or dissection. Atherosclerosis throughout the aorta. Mediastinum/Nodes: No enlarged mediastinal, hilar, or axillary lymph nodes. Thyroid gland, trachea, and esophagus demonstrate no significant findings. Lungs/Pleura: Small right pleural effusion volume estimated less than 500 cc. Chronic consolidation at the right apex, measuring 5.5 x 2.1 cm, not significantly changed since prior PET scan, consistent with treated right upper lobe lung cancer. Stable background emphysema. Chronic scarring or atelectasis left upper lobe. No acute airspace disease. No pneumothorax. The central airways are patent. Upper Abdomen: No acute abnormality. Musculoskeletal: No acute or destructive bony lesions. Reconstructed images demonstrate no additional findings. Review of the MIP images confirms the above findings. IMPRESSION: 1. No evidence of pulmonary  embolus. 2. Small right pleural effusion, volume estimated less than 500 cc. 3. Stable right apical consolidation consistent with treated lung cancer. Continued attention on follow-up. 4. Aortic Atherosclerosis (ICD10-I70.0) and Emphysema (ICD10-J43.9). Electronically Signed   By: Randa Ngo M.D.   On: 04/22/2022 23:22   DG Chest Portable 1 View  Result Date: 04/22/2022 CLINICAL DATA:  Shortness of breath and cough. History of COPD. Severe right lung cancer status post radiation  therapy. EXAM: PORTABLE CHEST 1 VIEW COMPARISON:  Chest two views 04/21/2022, 03/04/2021, 11/06/2020; PET-CT 02/06/2022, CT chest 06/10/2021 FINDINGS: There is again postradiation scarring within the superomedial right lung. Right costophrenic angle chronic pleural thickening/scar is unchanged. No acute airspace opacity is seen. No pneumothorax. Cardiac silhouette and mediastinal contours are within normal limits. No acute skeletal abnormality. IMPRESSION: Postradiation scarring in the superomedial right lung, unchanged. No acute lung process. No significant change from prior. Electronically Signed   By: Yvonne Kendall M.D.   On: 04/22/2022 18:40   DG Chest 2 View  Result Date: 04/21/2022 CLINICAL DATA:  Cough, wheezing, congestion, right lung cancer EXAM: CHEST - 2 VIEW COMPARISON:  03/04/2021, 02/06/2022 FINDINGS: Frontal and lateral views of the chest demonstrate an unremarkable cardiac silhouette. Chronic consolidation within the right apex compatible with treated lung cancer. Please refer to recent PET scan 02/06/2022. No acute airspace disease, effusion, or pneumothorax. Chronic right pleural thickening. No pneumothorax. No acute bony abnormalities. IMPRESSION: 1. Stable right apical consolidation consistent with treated lung cancer. Please refer to recent PET CT report. 2. No acute airspace disease. Electronically Signed   By: Randa Ngo M.D.   On: 04/21/2022 17:38     Assessment and Plan:   1. Acute hypoxic  respiratory with hypoxia, acute exacerbation of COPD 2. Chest pain, elevated troponin, unclear if demand ischemia or NSTEMI, coronary calcification/aortic atherosclerosis on CT 3.  New systolic dysfunction/cardiomyopathy, BNP 925 4. Essential HTN 5. Hypokalemia 6. DM2 7. GERD 8. H/o lung CA, prostate CA  Note prepped remotely, spoke with patient by phone. Will complete consultation upon in person MD assessment. Suspect presenting symptoms are primary related to AECOPD, but given description of symptoms, suspicious he may have underlying CAD - gives a history suspicious for exertional angina the last 6 months. Also has elevated BNP and drop in EF. More acute CP has GI features, improved with Maalox. However, given recurrent CP this AM, will recheck troponin. Add lipid panel, TSH in AM. Agree with ASA, heparin per pharmacy. Consider changing statin to more potent version given aortic/coronary atherosclerosis on imaging. See attestation for further recommendations.  New York Heart Association (NYHA) Functional Class NYHA Class III    For questions or updates, please contact [facility] Please consult [email or web address] for contact info under    Signed, Charlie Pitter, PA-C  04/24/2022 11:22 AM  I have personally seen and examined this patient. I agree with the assessment and plan as outlined above. 76 yo male with history of DM, HTN, HLD, lung cancer, prostate cancer, CKD, COPD admitted with chest pain and dyspnea. He is felt to have a COPD exacerbation and is being treated for this. Troponin was elevated at 522 at time of admission and is now down to 179. Recurrent chest pain and repeat troponin pending now.   EKG reviewed by me shows sinus tachycardia with no ischemic changes. Echo yesterday with LVEF=40-45% but unable to comment on wall motion abnormalities. No significant valve disease. Labs reviewed by me.   He tells me today that his breathing is better. Chest pain this am as above  but none currently.   My exam:  General: Well developed, well nourished, NAD  HEENT: OP clear, mucus membranes moist  SKIN: warm, dry. No rashes. Neuro: No focal deficits  Musculoskeletal: Muscle strength 5/5 all ext  Psychiatric: Mood and affect normal  Neck: No JVD, no carotid bruits, no thyromegaly, no lymphadenopathy.  Lungs: Rhonci bilaterally Cardiovascular: Regular rate and rhythm. No  murmurs, gallops or rubs. Abdomen:Soft. Bowel sounds present. Non-tender.  Extremities: No lower extremity edema. Pulses are 2 + in the bilateral DP/PT.  Plan:  Elevated troponin and chest pain in setting of COPD exacerbation: While his elevated troponin and chest pain could be related to demand ischemia in setting of COPD exacerbation, he does have multiple risk factors for CAD including age, DM, HTN, HLD, remote tobacco abuse. His LV systolic function is not normal and this could be driven by CAD. I think an ischemic evaluation is indicated when he has recovered from his acute respiratory issues. This could be with a coronary CTA or cardiac cath. Given his age, there may be artifact from calcification with a coronary CTA. The best study will probably be a cardiac catheterization. I have discussed this with him. I would not plan a cardiac cath today. No urgent indication for cath.  We will follow him over the weekend and if he continues to improve clinically, we Hurst perform cardiac cath at [facility] early next week. Of note, he is still requiring supplemental O2 at this time.  For now, would continue ASA, statin, beta blocker and IV heparin.  We will see him this weekend and make plans for cardiac cath next week if clinically appropriate.   Lauree Chandler, MD, [facility] 04/24/2022 12:59 PM

## 2022-04-24 NOTE — Progress Notes (Signed)
ANTICOAGULATION CONSULT NOTE - Follow Up Consult  Pharmacy Consult for heparin  Indication: NSTEMI  Allergies  Allergen Reactions   No Known Allergies    Lemon Oil Rash    Patient Measurements: Height: 5\' 9"  (175.3 cm) Weight: 83.9 kg (185 lb) IBW/kg (Calculated) : 70.7 Heparin Dosing Weight: 84 kg  Vital Signs: Temp: 98.1 F (36.7 C) (12/15 0350) Temp Source: Oral (12/15 0350) BP: 138/89 (12/15 0350) Pulse Rate: 105 (12/15 0350)  Labs: Recent Labs    04/22/22 1945 04/22/22 2258 04/23/22 0700 04/23/22 0800 04/23/22 1859 04/24/22 0445  HGB 11.6*  --  11.0*  --   --  11.2*  HCT 36.5*  --  33.7*  --   --  34.6*  PLT 230  --  239  --   --  291  APTT  --  35  --   --   --   --   LABPROT  --  14.5  --   --   --   --   INR  --  1.1  --   --   --   --   HEPARINUNFRC  --   --   --  0.28* 0.24* 0.43  CREATININE 0.90  --  1.01  --   --   --   TROPONINIHS 522* 506* 300*  --   --   --      Estimated Creatinine Clearance: 62.2 mL/min (by C-G formula based on SCr of 1.01 mg/dL).   Assessment: Patient is a 76 y.o M with hx COPD who presented to the ED on 04/22/22 with c/o cough,chest tightness and SOB.  In the ED, he was found to be tachycardic with elevated troponin.  He's currently on heparin drip for NSTEMI.  Today, 04/24/2022: - heparin level now therapeutic after rate increase to 1250 units/hr - cbc stable - no line issues or bleeding reported per RN  Goal of Therapy:  Heparin level 0.3-0.7 units/ml Monitor platelets by anticoagulation protocol: Yes   Plan:  - Continue heparin drip to 1250 units/hr - check confirmatory heparin level in 8 hr - monitor for s/sx bleeding    Reuel Boom, PharmD, BCPS [phone] 04/24/2022, 5:24 AM

## 2022-04-24 NOTE — Progress Notes (Signed)
Patient c/o sudden, severe "chest burning." Denies pain or pressure. States burning started after eating breakfast. Pt holding his chest, grimacing, diaphoretic and flushed. VSS, HR 110s. EKG done and MD Dahal assessed at bedside. PRN Maalox given and was effective. Pt verbalized relief shortly after. Will continue to monitor.

## 2022-04-25 DIAGNOSIS — I5022 Chronic systolic (congestive) heart failure: Secondary | ICD-10-CM

## 2022-04-25 DIAGNOSIS — R7989 Other specified abnormal findings of blood chemistry: Secondary | ICD-10-CM

## 2022-04-25 DIAGNOSIS — I5021 Acute systolic (congestive) heart failure: Secondary | ICD-10-CM | POA: Diagnosis not present

## 2022-04-25 DIAGNOSIS — J441 Chronic obstructive pulmonary disease with (acute) exacerbation: Secondary | ICD-10-CM | POA: Diagnosis not present

## 2022-04-25 LAB — GLUCOSE, CAPILLARY
Glucose-Capillary: 139 mg/dL — ABNORMAL HIGH (ref 70–99)
Glucose-Capillary: 162 mg/dL — ABNORMAL HIGH (ref 70–99)
Glucose-Capillary: 184 mg/dL — ABNORMAL HIGH (ref 70–99)
Glucose-Capillary: 195 mg/dL — ABNORMAL HIGH (ref 70–99)

## 2022-04-25 LAB — CBC
HCT: 32.2 % — ABNORMAL LOW (ref 39.0–52.0)
Hemoglobin: 10.2 g/dL — ABNORMAL LOW (ref 13.0–17.0)
MCH: 27.5 pg (ref 26.0–34.0)
MCHC: 31.7 g/dL (ref 30.0–36.0)
MCV: 86.8 fL (ref 80.0–100.0)
Platelets: 251 10*3/uL (ref 150–400)
RBC: 3.71 MIL/uL — ABNORMAL LOW (ref 4.22–5.81)
RDW: 14.9 % (ref 11.5–15.5)
WBC: 8.4 10*3/uL (ref 4.0–10.5)
nRBC: 0 % (ref 0.0–0.2)

## 2022-04-25 LAB — LIPID PANEL
Cholesterol: 137 mg/dL (ref 0–200)
HDL: 60 mg/dL (ref >40)
LDL Cholesterol: 63 mg/dL (ref 0–99)
Total CHOL/HDL Ratio: 2.3 RATIO
Triglycerides: 71 mg/dL (ref <150)
VLDL: 14 mg/dL (ref 0–40)

## 2022-04-25 LAB — T4, FREE: Free T4: 1.05 ng/dL (ref 0.61–1.12)

## 2022-04-25 LAB — TSH: TSH: 0.157 u[IU]/mL — ABNORMAL LOW (ref 0.350–4.500)

## 2022-04-25 LAB — HEPARIN LEVEL (UNFRACTIONATED)
Heparin Unfractionated: 0.4 IU/mL (ref 0.30–0.70)
Heparin Unfractionated: 0.52 IU/mL (ref 0.30–0.70)

## 2022-04-25 MED ORDER — LOSARTAN POTASSIUM 25 MG PO TABS
25.0000 mg | ORAL_TABLET | Freq: Every day | ORAL | Status: DC
  Administered 2022-04-25 – 2022-04-28 (×4): 25 mg via ORAL
  Filled 2022-04-25 (×4): qty 1

## 2022-04-25 MED ORDER — GABAPENTIN 300 MG PO CAPS
300.0000 mg | ORAL_CAPSULE | Freq: Every day | ORAL | Status: DC
  Administered 2022-04-25 – 2022-04-27 (×3): 300 mg via ORAL
  Filled 2022-04-25 (×3): qty 1

## 2022-04-25 MED ORDER — GABAPENTIN 300 MG PO CAPS
300.0000 mg | ORAL_CAPSULE | Freq: Once | ORAL | Status: AC
  Administered 2022-04-25: 300 mg via ORAL
  Filled 2022-04-25: qty 1

## 2022-04-25 MED ORDER — MELATONIN 5 MG PO TABS
10.0000 mg | ORAL_TABLET | Freq: Every day | ORAL | Status: DC
  Administered 2022-04-25 – 2022-04-27 (×3): 10 mg via ORAL
  Filled 2022-04-25 (×3): qty 2

## 2022-04-25 MED ORDER — INFLUENZA VAC A&B SA ADJ QUAD 0.5 ML IM PRSY
0.5000 mL | PREFILLED_SYRINGE | Freq: Once | INTRAMUSCULAR | Status: AC
  Administered 2022-04-25: 0.5 mL via INTRAMUSCULAR
  Filled 2022-04-25: qty 0.5

## 2022-04-25 MED ORDER — METHYLPREDNISOLONE SODIUM SUCC 40 MG IJ SOLR
40.0000 mg | Freq: Every day | INTRAMUSCULAR | Status: DC
  Administered 2022-04-26 – 2022-04-27 (×2): 40 mg via INTRAVENOUS
  Filled 2022-04-25 (×2): qty 1

## 2022-04-25 NOTE — Progress Notes (Signed)
Mobility Specialist - Progress Note  Pre-mobility: 97% SpO2 During mobility: 96% SpO2 Post-mobility: 97% SPO2   04/25/22 1456  Mobility  Activity Ambulated independently in hallway  Level of Assistance Independent after set-up  Assistive Device None  Distance Ambulated (ft) 350 ft  Range of Motion/Exercises Active  Activity Response Tolerated well  Mobility Referral Yes  $Mobility charge 1 Mobility   Pt was found sitting EOB and agreeable to ambulate. Had no complaints during session and at EOS returned to bed with all necessities in reach and family in room.  Ferd Hibbs Mobility Specialist

## 2022-04-25 NOTE — Progress Notes (Signed)
Rounding Note    Patient Name: Alan Hurst Date of Encounter: 04/25/2022  [place] Cardiologist: None   Subjective   BP 118/64.  Denies any chest pain.  Reports dyspnea improving  Inpatient Medications    Scheduled Meds:  abiraterone acetate  1,000 mg Oral QAC breakfast   arformoterol  15 mcg Nebulization BID   And   umeclidinium bromide  1 puff Inhalation Daily   aspirin EC  81 mg Oral Daily   azithromycin  250 mg Oral QODAY   benzonatate  100 mg Oral TID   budesonide (PULMICORT) nebulizer solution  0.25 mg Nebulization BID   cholecalciferol  2,000 Units Oral Daily   influenza vaccine adjuvanted  0.5 mL Intramuscular Tomorrow-1000   insulin aspart  0-15 Units Subcutaneous TID WC   lisinopril  2.5 mg Oral Daily   magnesium oxide  400 mg Oral Daily   melatonin  5 mg Oral QHS   methylPREDNISolone (SOLU-MEDROL) injection  40 mg Intravenous Q12H   metoprolol tartrate  25 mg Oral BID   omega-3 acid ethyl esters  1 g Oral Daily   pantoprazole  40 mg Oral Daily   pravastatin  40 mg Oral q1800   sodium chloride flush  3 mL Intravenous Q12H   Continuous Infusions:  sodium chloride     heparin 1,300 Units/hr (04/24/22 2046)   PRN Meds: sodium chloride, acetaminophen **OR** acetaminophen, alum & mag hydroxide-simeth, guaiFENesin-dextromethorphan, levalbuterol, ondansetron **OR** ondansetron (ZOFRAN) IV, sodium chloride flush   Vital Signs    Vitals:   04/24/22 1443 04/24/22 1444 04/24/22 2121 04/25/22 0518  BP:   129/72 118/64  Pulse:   82 67  Resp:   15 17  Temp:   98.4 F (36.9 C) 97.8 F (36.6 C)  TempSrc:   Oral Oral  SpO2: 98% 98% 97% 97%  Weight:      Height:        Intake/Output Summary (Last 24 hours) at 04/25/2022 0650 Last data filed at 04/25/2022 0100 Gross per 24 hour  Intake 1494.89 ml  Output 1200 ml  Net 294.89 ml      04/22/2022   11:26 PM 02/24/2022    3:22 PM 11/20/2021    9:32 AM  Last 3 Weights  Weight (lbs) 185 lb  180 lb 179 lb  Weight (kg) 83.915 kg 81.647 kg 81.194 kg      Telemetry    NSR - Personally Reviewed  ECG    No new EKG - Personally Reviewed  Physical Exam   GEN: No acute distress.   Neck: No JVD Cardiac: RRR, no murmurs, rubs, or gallops.  Respiratory: Rhonchi GI: Soft, nontender, non-distended  MS: No edema; No deformity. Neuro:  Nonfocal  Psych: Normal affect   Labs    High Sensitivity Troponin:   Recent Labs  Lab 04/22/22 1945 04/22/22 2258 04/23/22 0700 04/24/22 0445 04/24/22 1142  TROPONINIHS 522* 506* 300* 179* 136*     Chemistry Recent Labs  Lab 04/22/22 1945 04/22/22 2258 04/23/22 0700 04/24/22 0445  NA 137  --  137 139  K 3.1*  --  4.0 4.5  CL 104  --  104 107  CO2 21*  --  23 23  GLUCOSE 168*  --  161* 151*  BUN 10  --  14 34*  CREATININE 0.90  --  1.01 1.02  CALCIUM 9.3  --  9.2 9.0  MG  --  2.3  --   --   PROT  6.7  --   --   --   ALBUMIN 3.1*  --   --   --   AST 31  --   --   --   ALT 19  --   --   --   ALKPHOS 60  --   --   --   BILITOT 0.8  --   --   --   GFRNONAA >60  --  >60 >60  ANIONGAP 12  --  10 9    Lipids No results for input(s): "CHOL", "TRIG", "HDL", "LABVLDL", "LDLCALC", "CHOLHDL" in the last 168 hours.  Hematology Recent Labs  Lab 04/23/22 0700 04/24/22 0445 04/25/22 0500  WBC 6.7 15.4* 8.4  RBC 4.07* 4.07* 3.71*  HGB 11.0* 11.2* 10.2*  HCT 33.7* 34.6* 32.2*  MCV 82.8 85.0 86.8  MCH 27.0 27.5 27.5  MCHC 32.6 32.4 31.7  RDW 14.6 15.0 14.9  PLT 239 291 251   Thyroid No results for input(s): "TSH", "FREET4" in the last 168 hours.  BNP Recent Labs  Lab 04/22/22 1945  BNP 925.5*    DDimer  Recent Labs  Lab 04/22/22 2258  DDIMER 0.86*     Radiology    ECHOCARDIOGRAM COMPLETE  Result Date: 04/23/2022    ECHOCARDIOGRAM REPORT   Patient Name:   Alan Hurst Date of Exam: 04/23/2022 Medical Rec #:  [id number]       Height:       69.0 in Accession #:    [id number]      Weight:       185.0 lb Date of  Birth:  [birth year]       BSA:          1.999 m Patient Age:    55 years        BP:           140/95 mmHg Patient Gender: M               HR:           107 bpm. Exam Location:  Inpatient Procedure: 2D Echo and Intracardiac Opacification Agent Indications:    NSTEMI  History:        Patient has no prior history of Echocardiogram examinations.                 COPD; Risk Factors:Hypertension, Former Smoker and Diabetes.  Sonographer:    Harvie Junior Referring Phys: [id number]  Sonographer Comments: Technically difficult study due to poor echo windows, suboptimal parasternal window, suboptimal apical window and suboptimal subcostal window. Image acquisition challenging due to COPD and Image acquisition challenging due to respiratory motion. IMPRESSIONS  1. Left ventricular ejection fraction, by estimation, is 40 to 45%. The left ventricle has mildly decreased function. Left ventricular endocardial border not optimally defined to evaluate regional wall motion. Indeterminate diastolic filling due to E-A fusion.  2. Right ventricular systolic function is normal. The right ventricular size is normal. There is normal pulmonary artery systolic pressure.  3. The mitral valve is degenerative. Trivial mitral valve regurgitation. No evidence of mitral stenosis.  4. The aortic valve is normal in structure. Aortic valve regurgitation is not visualized. No aortic stenosis is present.  5. The inferior vena cava is normal in size with greater than 50% respiratory variability, suggesting right atrial pressure of 3 mmHg. FINDINGS  Left Ventricle: Left ventricular ejection fraction, by estimation, is 40 to 45%. The left ventricle has mildly decreased function. Left ventricular endocardial  border not optimally defined to evaluate regional wall motion. Definity contrast agent was given IV to delineate the left ventricular endocardial borders. The left ventricular internal cavity size was normal in size. There is no left  ventricular hypertrophy. Indeterminate diastolic filling due to E-A fusion. Right Ventricle: The right ventricular size is normal. No increase in right ventricular wall thickness. Right ventricular systolic function is normal. There is normal pulmonary artery systolic pressure. The tricuspid regurgitant velocity is 2.46 m/s, and  with an assumed right atrial pressure of 3 mmHg, the estimated right ventricular systolic pressure is 76.2 mmHg. Left Atrium: Left atrial size was normal in size. Right Atrium: Right atrial size was normal in size. Pericardium: There is no evidence of pericardial effusion. Mitral Valve: The mitral valve is degenerative in appearance. There is mild calcification of the anterior mitral valve leaflet(s). Mild to moderate mitral annular calcification. Trivial mitral valve regurgitation. No evidence of mitral valve stenosis. Tricuspid Valve: The tricuspid valve is normal in structure. Tricuspid valve regurgitation is trivial. No evidence of tricuspid stenosis. Aortic Valve: The aortic valve is normal in structure. Aortic valve regurgitation is not visualized. No aortic stenosis is present. Aortic valve mean gradient measures 2.0 mmHg. Aortic valve peak gradient measures 3.4 mmHg. Aortic valve area, by VTI measures 3.67 cm. Pulmonic Valve: The pulmonic valve was normal in structure. Pulmonic valve regurgitation is not visualized. No evidence of pulmonic stenosis. Aorta: The aortic root is normal in size and structure. Venous: The inferior vena cava is normal in size with greater than 50% respiratory variability, suggesting right atrial pressure of 3 mmHg. IAS/Shunts: No atrial level shunt detected by color flow Doppler.  LEFT VENTRICLE PLAX 2D LVIDd:         4.60 cm      Diastology LVIDs:         4.10 cm      LV e' medial:    4.57 cm/s LV PW:         1.00 cm      LV E/e' medial:  27.8 LV IVS:        0.90 cm      LV e' lateral:   18.50 cm/s LVOT diam:     2.30 cm      LV E/e' lateral: 6.9 LV SV:          66 LV SV Index:   33 LVOT Area:     4.15 cm  LV Volumes (MOD) LV vol d, MOD A2C: 113.0 ml LV vol d, MOD A4C: 89.2 ml LV vol s, MOD A2C: 66.7 ml LV vol s, MOD A4C: 52.0 ml LV SV MOD A2C:     46.3 ml LV SV MOD A4C:     89.2 ml LV SV MOD BP:      42.8 ml RIGHT VENTRICLE RV Basal diam:  3.80 cm RV Mid diam:    3.40 cm TAPSE (M-mode): 2.2 cm LEFT ATRIUM             Index        RIGHT ATRIUM           Index LA diam:        3.60 cm 1.80 cm/m   RA Area:     13.50 cm LA Vol (A2C):   63.3 ml 31.67 ml/m  RA Volume:   30.00 ml  15.01 ml/m LA Vol (A4C):   36.9 ml 18.46 ml/m LA Biplane Vol: 49.9 ml 24.97 ml/m  AORTIC VALVE AV Area (  Vmax):    3.82 cm AV Area (Vmean):   3.20 cm AV Area (VTI):     3.67 cm AV Vmax:           92.70 cm/s AV Vmean:          73.100 cm/s AV VTI:            0.179 m AV Peak Grad:      3.4 mmHg AV Mean Grad:      2.0 mmHg LVOT Vmax:         85.30 cm/s LVOT Vmean:        56.300 cm/s LVOT VTI:          0.158 m LVOT/AV VTI ratio: 0.88  AORTA Ao Root diam: 3.40 cm MITRAL VALVE                TRICUSPID VALVE MV Area (PHT): 5.02 cm     TR Peak grad:   24.2 mmHg MV Decel Time: 151 msec     TR Vmax:        246.00 cm/s MR Peak grad: 21.8 mmHg MR Vmax:      233.67 cm/s   SHUNTS MV E velocity: 127.00 cm/s  Systemic VTI:  0.16 m MV A velocity: 33.50 cm/s   Systemic Diam: 2.30 cm MV E/A ratio:  3.79 Fransico Him MD Electronically signed by Fransico Him MD Signature Date/Time: 04/23/2022/1:22:41 PM    Final     Cardiac Studies     Patient Profile     76 y.o. 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  EKG unremarkable.  Echo shows new systolic dysfunction (EF 67-61%).  Could repesent demand ischemia in setting of his COPD exacerbation, but with new systolic heart failure recommend ischemia evaluation.  -Plan cath once recovered from respiratory issues.  He is improving, likely cath on Monday -Continue ASA, heparin gtt, statin  Acute systolic heart failure: Echo shows new systolic dysfunction (EF 95-09%) -On metoprolol, will consolidate to toprol XL prior to discharge -Switch from lisinopril to losartan, likely transition to entresto after cath  Acute respiratory failure with hypoxia: due to COPD exacerbation, treatment per primary team  HLD: on pravastatin 40 mg daily.  Check lipid panel.   For questions or updates, please contact [place] Please consult [email or web address] for contact info under        Signed, Donato Heinz, MD  04/25/2022, 6:50 AM

## 2022-04-25 NOTE — Progress Notes (Addendum)
PROGRESS NOTE  Alan Hurst  DOB: 1945/10/28  PCP: Lorrene Reid, PA-C IWP:[id number]  DOA: 04/22/2022  LOS: 3 days  Hospital Day: 4  Brief narrative: Alan Hurst is a 76 y.o. male with PMH significant for DM2, HTN, HLD, COPD, history of lung cancer-treated 12/13, patient presented to the ED for shortness of breath, productive cough, intermittent midsternal chest pain for a week.  Also reports bilateral lower extremity swelling (Rt >> Lt).he has been using his albuterol metered-dose inhaler without any significant improvement in his symptoms. 12/12, he was seen at the urgent care clinic and was prescribed a steroid inhaler and Mucinex but did not feel relief.  In the ED, patient was afebrile, tachycardic to 130s, blood pressure in 150s, required 3 L oxygen manage cannula Initial labs with unremarkable CBC, potassium low at 3.1, renal function normal, BNP elevated to 925, troponin elevated 522, lactic acid elevated to 2 CT angio chest did not show evidence of pulm embolism but showed stable right apical consolidation consistent with treated lung cancer. EKG showed sinus tachycardia. Admitted to [facility]   Subjective: Patient was seen and examined this morning.   Lying on bed.  Not in distress.  No new symptoms.  No chest pain today. States coughing has improved.  Assessment and plan: Acute exacerbation of COPD Known history of COPD, presented with progressive worsening respiratory symptoms. No evidence of pneumonia on chest imaging. Clinically he had wheezing and hence he was started on systemic and inhaled steroids currently on IV Solu-Medrol 40 mg twice daily.  Taper to daily. PTA, he was on prednisone 5 mg daily, beclomethasone inhalational twice daily, Stiolto twice daily as well as azithromycin 250 mg every other day. Resume bronchodilators and antibiotics. Continue bronchodilators, Mucinex. Albuterol was replaced with Xopenex because of tachycardia Not on antibiotics.   Lactic acid normalized.  Recent Labs  Lab 04/22/22 1945 04/23/22 0700 04/24/22 0445 04/25/22 0500  WBC 7.6 6.7 15.4* 8.4  LATICACIDVEN 2.0*  --  1.1  --    Acute respiratory failure with hypoxia Currently requiring 2 to 3 L oxygen to maintain O2 sat.  Not on supplemental oxygen at home. Check ambulatory oxygen requirement Follows up with pulmonologist Dr. Josefina Do as at [facility]  NSTEMI (non-ST elevated myocardial infarction)  New drop in EF Patient complained of chest pain on admission.  Troponin was elevated.  Chest pain and troponin were initially suspected to be secondary to demand ischemia from tachycardia and acute COPD exacerbation Patient's cardiac risk factors include hypertension and diabetes mellitus On admission, patient was started on heparin drip. Echocardiogram obtained yesterday showed a new drop in EF to 40 to 45% but could not comment on wall motion abnormality. 12/15, patient had recurrence of chest pain.  Heparin drip was restarted.  Cardiology was consulted.  Tentative plan of cardiac catheter Monday. Continue aspirin and statin.  Metoprolol was added as well on admission Recent Labs    04/22/22 1945 04/22/22 2258 04/23/22 0700 04/24/22 0445 04/24/22 1142  TROPONINIHS 522* 506* 300* 179* 136*   GERD PTA on Prilosec Continue the same  Essential hypertension Continue lisinopril and metoprolol  Controlled type 2 diabetes mellitus Hyperglycemia due to steroids A1c 6.4 on 11/20/2021 PTA on metformin which is currently on hold Continue sliding scale insulin with Accu-Cheks Recent Labs  Lab 04/24/22 1208 04/24/22 1629 04/24/22 2112 04/25/22 0755 04/25/22 1150  GLUCAP 151* 154* 142* 139* 162*    GERD (gastroesophageal reflux disease) Continue PPI  History of lung cancer  Had radiation.  In remission.  Follows up with Dr. Baird Cancer at [place] of care   Code Status: Full Code    Mobility: Encourage ambulation  Scheduled Meds:   abiraterone acetate  1,000 mg Oral QAC breakfast   arformoterol  15 mcg Nebulization BID   And   umeclidinium bromide  1 puff Inhalation Daily   aspirin EC  81 mg Oral Daily   azithromycin  250 mg Oral QODAY   benzonatate  100 mg Oral TID   budesonide (PULMICORT) nebulizer solution  0.25 mg Nebulization BID   cholecalciferol  2,000 Units Oral Daily   influenza vaccine adjuvanted  0.5 mL Intramuscular Once   insulin aspart  0-15 Units Subcutaneous TID WC   losartan  25 mg Oral Daily   magnesium oxide  400 mg Oral Daily   melatonin  5 mg Oral QHS   [START ON 04/26/2022] methylPREDNISolone (SOLU-MEDROL) injection  40 mg Intravenous Daily   metoprolol tartrate  25 mg Oral BID   omega-3 acid ethyl esters  1 g Oral Daily   pantoprazole  40 mg Oral Daily   pravastatin  40 mg Oral q1800   sodium chloride flush  3 mL Intravenous Q12H    PRN meds: sodium chloride, acetaminophen **OR** acetaminophen, alum & mag hydroxide-simeth, guaiFENesin-dextromethorphan, levalbuterol, ondansetron **OR** ondansetron (ZOFRAN) IV, sodium chloride flush   Infusions:   sodium chloride     heparin 1,300 Units/hr (04/25/22 1019)    Skin assessment:     Nutritional status:  Body mass index is 27.32 kg/m.          Diet:  Diet Order             Diet Carb Modified Fluid consistency: Thin; Room service appropriate? Yes  Diet effective now                   DVT prophylaxis: Currently on heparin drip  Antimicrobials: None Fluid: None Consultants: None Family Communication: None at bedside  Status is: Inpatient  Continue in-hospital care because: Pending cardiac catheter Monday level of care: Progressive   Dispo: The patient is from: Home              Anticipated d/c is to: Pending clinical course              Patient currently is not medically stable to d/c.   Difficult to place patient No    Antimicrobials: Anti-infectives (From admission, onward)    Start     Dose/Rate Route  Frequency Ordered Stop   04/24/22 1145  azithromycin (ZITHROMAX) tablet 250 mg        250 mg Oral Every other day 04/24/22 1046         Objective: Vitals:   04/25/22 0938 04/25/22 1315  BP:  121/66  Pulse:  87  Resp:  18  Temp:  97.9 F (36.6 C)  SpO2: 97% 98%    Intake/Output Summary (Last 24 hours) at 04/25/2022 1413 Last data filed at 04/25/2022 1019 Gross per 24 hour  Intake 575.98 ml  Output 1220 ml  Net -644.02 ml   Filed Weights   04/22/22 2326  Weight: 83.9 kg   Weight change:  Body mass index is 27.32 kg/m.   Physical Exam: General exam: Pleasant, elderly Caucasian male.  Not in physical distress Skin: No rashes, lesions or ulcers. HEENT: Atraumatic, normocephalic, no obvious bleeding Lungs: Clear to auscultation bilaterally.  Improving cough CVS: Regular rate and rhythm,  no murmur GI/Abd soft, nontender, nondistended, bowel sound present CNS: Alert, awake, oriented x 3 Psychiatry: Mood appropriate Extremities: No pedal edema, no calf tenderness  Data Review: I have personally reviewed the laboratory data and studies available.  F/u labs ordered Unresulted Labs (From admission, onward)     Start     Ordered   04/26/22 7169  Basic metabolic panel  Daily,   R     Question:  Specimen collection method  Answer:  Lab=Lab collect   04/25/22 0704   04/26/22 0500  Heparin level (unfractionated)  Daily,   R     Question:  Specimen collection method  Answer:  Lab=Lab collect   04/25/22 1406   04/25/22 0757  T4, free  Add-on,   AD       Question:  Specimen collection method  Answer:  Lab=Lab collect   04/25/22 0756   04/24/22 0500  CBC  Daily,   R      04/22/22 2318            Total time spent in review of labs and imaging, patient evaluation, formulation of plan, documentation and communication with family -9 minutes  Signed, Terrilee Croak, MD [facility] 04/25/2022

## 2022-04-25 NOTE — Progress Notes (Signed)
ANTICOAGULATION CONSULT NOTE  Pharmacy Consult for heparin  Indication: NSTEMI  Allergies  Allergen Reactions   No Known Allergies    Lemon Oil Rash    Patient Measurements: Height: 5\' 9"  (175.3 cm) Weight: 83.9 kg (185 lb) IBW/kg (Calculated) : 70.7 Heparin Dosing Weight: 84 kg  Vital Signs: Temp: 97.8 F (36.6 C) (12/16 0518) Temp Source: Oral (12/16 0518) BP: 118/64 (12/16 0518) Pulse Rate: 67 (12/16 0518)  Labs: Recent Labs    04/22/22 1945 04/22/22 2258 04/23/22 0700 04/23/22 0800 04/24/22 0445 04/24/22 1142 04/24/22 1939 04/25/22 0500  HGB 11.6*  --  11.0*  --  11.2*  --   --  10.2*  HCT 36.5*  --  33.7*  --  34.6*  --   --  32.2*  PLT 230  --  239  --  291  --   --  251  APTT  --  35  --   --   --   --   --   --   LABPROT  --  14.5  --   --   --   --   --   --   INR  --  1.1  --   --   --   --   --   --   HEPARINUNFRC  --   --   --    < > 0.43  --  0.28* 0.40  CREATININE 0.90  --  1.01  --  1.02  --   --   --   TROPONINIHS 522* 506* 300*  --  179* 136*  --   --    < > = values in this interval not displayed.     Estimated Creatinine Clearance: 61.6 mL/min (by C-G formula based on SCr of 1.02 mg/dL).   Assessment: Patient is a 76 y.o M with hx COPD who presented to the ED on 04/22/22 with c/o cough,chest tightness and SOB. In the ED, he was found to be tachycardic with elevated troponin.  He's currently on heparin drip for NSTEMI.  Today, 04/25/2022: Heparin now therapeutic on 1300 units/hr Hgb slightly lower from yesterday; Plt stable WNL SCr stable WNL No line issues or bleeding reported per RN  Goal of Therapy:  Heparin level 0.3-0.7 units/ml Monitor platelets by anticoagulation protocol: Yes   Plan:  Continue heparin drip at 1300 units/hr Recheck confirmatory heparin level in 8 hr monitor for s/sx bleeding; heparin LOT   Reuel Boom, PharmD, BCPS [phone] 04/25/2022, 6:37 AM

## 2022-04-25 NOTE — Plan of Care (Signed)
  Problem: Education: Goal: Knowledge of disease or condition will improve Outcome: Not Progressing Goal: Knowledge of the prescribed therapeutic regimen will improve Outcome: Not Progressing Goal: Individualized Educational Video(s) Outcome: Not Progressing   Problem: Activity: Goal: Ability to tolerate increased activity will improve Outcome: Progressing Goal: Will verbalize the importance of balancing activity with adequate rest periods Outcome: Progressing   Problem: Respiratory: Goal: Ability to maintain a clear airway will improve Outcome: Progressing Goal: Levels of oxygenation will improve Outcome: Progressing Goal: Ability to maintain adequate ventilation will improve Outcome: Progressing   Problem: Education: Goal: Knowledge of General Education information will improve Description: Including pain rating scale, medication(s)/side effects and non-pharmacologic comfort measures Outcome: Progressing   Problem: Health Behavior/Discharge Planning: Goal: Ability to manage health-related needs will improve Outcome: Progressing   Problem: Clinical Measurements: Goal: Ability to maintain clinical measurements within normal limits will improve Outcome: Progressing Goal: Will remain free from infection Outcome: Progressing Goal: Diagnostic test results will improve Outcome: Progressing Goal: Respiratory complications will improve Outcome: Progressing Goal: Cardiovascular complication will be avoided Outcome: Progressing   Problem: Activity: Goal: Risk for activity intolerance will decrease Outcome: Progressing   Problem: Nutrition: Goal: Adequate nutrition will be maintained Outcome: Progressing   Problem: Coping: Goal: Level of anxiety will decrease Outcome: Progressing   Problem: Elimination: Goal: Will not experience complications related to bowel motility Outcome: Progressing Goal: Will not experience complications related to urinary retention Outcome:  Progressing   Problem: Safety: Goal: Ability to remain free from injury will improve Outcome: Progressing   Problem: Skin Integrity: Goal: Risk for impaired skin integrity will decrease Outcome: Progressing

## 2022-04-25 NOTE — Progress Notes (Signed)
Initial Nutrition Assessment  DOCUMENTATION CODES:   Not applicable  INTERVENTION:  - Continue current diet order.   NUTRITION DIAGNOSIS:  No nutrition diagnosis.   GOAL:   Patient will meet greater than or equal to 90% of their needs  MONITOR:   PO intake  REASON FOR ASSESSMENT:   Malnutrition Screening Tool    ASSESSMENT:   76 y.o. male admits related to SOB. PMH includes: DM, GERD, HLD, HTN. Pt is currently receiving medical management for acute exacerbation of COPD.  Meds: Vit D3, sliding scale insulin, cozaar, mag-ox, solu-medrol. Labs reviewed.   The pt reports that he has been eating well since admission. He states that he is eating 100% of his meals. He reports that for 2 days PTA he was not able to keep anything down. However, prior to these 2 days he was eating normally. He denies any wt changes.   NUTRITION - FOCUSED PHYSICAL EXAM:  Unable to assess, attempt at f/u.   Diet Order:   Diet Order             Diet Carb Modified Fluid consistency: Thin; Room service appropriate? Yes  Diet effective now                   EDUCATION NEEDS:   Not appropriate for education at this time  Skin:  Skin Assessment: Reviewed RN Assessment  Last BM:  04/22/22  Height:   Ht Readings from Last 1 Encounters:  04/23/22 5\' 9"  (1.753 m)    Weight:   Wt Readings from Last 1 Encounters:  04/22/22 83.9 kg    Ideal Body Weight:     BMI:  Body mass index is 27.32 kg/m.  Estimated Nutritional Needs:   Kcal:  7858-8502 kcals  Protein:  100-125 gm  Fluid:  >/= 2 L  Thalia Bloodgood, RD, LDN, CNSC.

## 2022-04-25 NOTE — Progress Notes (Signed)
Mobility Specialist - Progress Note  Pre-mobility: 70 bpm HR, 98% SpO2 During mobility: 94 bpm HR, 97% SpO2 Post-mobility: 94 bpm HR, 98% SPO2   04/25/22 1200  Oxygen Therapy  O2 Device Nasal Cannula  O2 Flow Rate (L/min) 2 L/min  Mobility  Activity Ambulated independently in hallway  Level of Assistance Independent after set-up  Assistive Device None  Distance Ambulated (ft) 150 ft  Range of Motion/Exercises Active  Activity Response Tolerated well  Mobility Referral Yes  $Mobility charge 1 Mobility   Pt was found in bed and agreeable to ambulate. Had some coughing towards EOS but stated he felt his breathing has gotten better. At EOS was left on recliner chair with necessities in reach.  Ferd Hibbs Mobility Specialist

## 2022-04-25 NOTE — Progress Notes (Signed)
ANTICOAGULATION CONSULT NOTE - Follow Up Consult  Pharmacy Consult for Heparin Indication: NSTEMI  Allergies  Allergen Reactions   No Known Allergies    Lemon Oil Rash    Patient Measurements: Height: 5\' 9"  (175.3 cm) Weight: 83.9 kg (185 lb) IBW/kg (Calculated) : 70.7 Heparin Dosing Weight: TBW  Vital Signs: Temp: 97.8 F (36.6 C) (12/16 0518) Temp Source: Oral (12/16 0518) BP: 118/64 (12/16 0518) Pulse Rate: 67 (12/16 0518)  Labs: Recent Labs    04/22/22 1945 04/22/22 2258 04/23/22 0700 04/23/22 0800 04/24/22 0445 04/24/22 1142 04/24/22 1939 04/25/22 0500  HGB 11.6*  --  11.0*  --  11.2*  --   --  10.2*  HCT 36.5*  --  33.7*  --  34.6*  --   --  32.2*  PLT 230  --  239  --  291  --   --  251  APTT  --  35  --   --   --   --   --   --   LABPROT  --  14.5  --   --   --   --   --   --   INR  --  1.1  --   --   --   --   --   --   HEPARINUNFRC  --   --   --    < > 0.43  --  0.28* 0.40  CREATININE 0.90  --  1.01  --  1.02  --   --   --   TROPONINIHS 522* 506* 300*  --  179* 136*  --   --    < > = values in this interval not displayed.    Estimated Creatinine Clearance: 61.6 mL/min (by C-G formula based on SCr of 1.02 mg/dL).   Medications:  Infusions:   sodium chloride     heparin 1,300 Units/hr (04/25/22 1019)    Assessment: Patient is a 76 y.o M with hx COPD who presented to the ED on 04/22/22 with c/o cough,chest tightness and SOB. In the ED, he was found to be tachycardic with elevated troponin. He's currently on heparin drip for NSTEMI.   Today, 04/25/2022: Heparin level 0.52, therapeutic on heparin 1300 units/hr CBC:  Hgb decreased to 10.2, Plt 251 No bleeding or complications reported.    Goal of Therapy:  Heparin level 0.3-0.7 units/ml Monitor platelets by anticoagulation protocol: Yes   Plan:  Continue heparin IV infusion at 1300 units/hr Daily heparin level and CBC   Gretta Arab PharmD, BCPS [facility] main pharmacy [phone] 04/25/2022  12:26 PM

## 2022-04-25 NOTE — Progress Notes (Signed)
1915 patient alert x4 on 2L [place] able to make all needs known eating dinner at this time 2100 all meds given as ordered

## 2022-04-26 DIAGNOSIS — I5021 Acute systolic (congestive) heart failure: Secondary | ICD-10-CM | POA: Diagnosis not present

## 2022-04-26 DIAGNOSIS — J441 Chronic obstructive pulmonary disease with (acute) exacerbation: Secondary | ICD-10-CM | POA: Diagnosis not present

## 2022-04-26 DIAGNOSIS — R7989 Other specified abnormal findings of blood chemistry: Secondary | ICD-10-CM | POA: Diagnosis not present

## 2022-04-26 LAB — CBC
HCT: 31.7 % — ABNORMAL LOW (ref 39.0–52.0)
Hemoglobin: 10 g/dL — ABNORMAL LOW (ref 13.0–17.0)
MCH: 27.6 pg (ref 26.0–34.0)
MCHC: 31.5 g/dL (ref 30.0–36.0)
MCV: 87.6 fL (ref 80.0–100.0)
Platelets: 259 10*3/uL (ref 150–400)
RBC: 3.62 MIL/uL — ABNORMAL LOW (ref 4.22–5.81)
RDW: 14.9 % (ref 11.5–15.5)
WBC: 8 10*3/uL (ref 4.0–10.5)
nRBC: 0 % (ref 0.0–0.2)

## 2022-04-26 LAB — BASIC METABOLIC PANEL
Anion gap: 7 (ref 5–15)
BUN: 36 mg/dL — ABNORMAL HIGH (ref 8–23)
CO2: 23 mmol/L (ref 22–32)
Calcium: 8.4 mg/dL — ABNORMAL LOW (ref 8.9–10.3)
Chloride: 106 mmol/L (ref 98–111)
Creatinine, Ser: 1.05 mg/dL (ref 0.61–1.24)
GFR, Estimated: >60 mL/min (ref >60)
Glucose, Bld: 133 mg/dL — ABNORMAL HIGH (ref 70–99)
Potassium: 4 mmol/L (ref 3.5–5.1)
Sodium: 136 mmol/L (ref 135–145)

## 2022-04-26 LAB — GLUCOSE, CAPILLARY
Glucose-Capillary: 106 mg/dL — ABNORMAL HIGH (ref 70–99)
Glucose-Capillary: 131 mg/dL — ABNORMAL HIGH (ref 70–99)
Glucose-Capillary: 185 mg/dL — ABNORMAL HIGH (ref 70–99)
Glucose-Capillary: 250 mg/dL — ABNORMAL HIGH (ref 70–99)

## 2022-04-26 LAB — HEPARIN LEVEL (UNFRACTIONATED): Heparin Unfractionated: 0.42 IU/mL (ref 0.30–0.70)

## 2022-04-26 MED ORDER — SODIUM CHLORIDE 0.9% FLUSH: 3.0000 mL | INTRAVENOUS | Status: DC | PRN

## 2022-04-26 MED ORDER — METOPROLOL SUCCINATE ER 25 MG PO TB24
25.0000 mg | ORAL_TABLET | Freq: Every day | ORAL | Status: DC
  Administered 2022-04-26 – 2022-04-28 (×3): 25 mg via ORAL
  Filled 2022-04-26 (×3): qty 1

## 2022-04-26 MED ORDER — SODIUM CHLORIDE 0.9 % IV SOLN: INTRAVENOUS | Status: DC

## 2022-04-26 MED ORDER — SODIUM CHLORIDE 0.9% FLUSH
3.0000 mL | Freq: Two times a day (BID) | INTRAVENOUS | Status: DC
  Administered 2022-04-26 – 2022-04-27 (×3): 3 mL via INTRAVENOUS

## 2022-04-26 MED ORDER — SODIUM CHLORIDE 0.9 % IV SOLN: 250.0000 mL | INTRAVENOUS | Status: DC | PRN

## 2022-04-26 NOTE — Progress Notes (Signed)
PROGRESS NOTE  Alan Hurst  DOB: 1946/03/14  PCP: Lorrene Reid, PA-C TGG:[id number]  DOA: 04/22/2022  LOS: 4 days  Hospital Day: 5  Brief narrative: Alan Hurst is a 76 y.o. male with PMH significant for DM2, HTN, HLD, COPD, history of lung cancer-treated 12/13, patient presented to the ED for shortness of breath, productive cough, intermittent midsternal chest pain for a week.  Also reports bilateral lower extremity swelling (Rt >> Lt).he has been using his albuterol metered-dose inhaler without any significant improvement in his symptoms. 12/12, he was seen at the urgent care clinic and was prescribed a steroid inhaler and Mucinex but did not feel relief.  In the ED, patient was afebrile, tachycardic to 130s, blood pressure in 150s, required 3 L oxygen manage cannula Initial labs with unremarkable CBC, potassium low at 3.1, renal function normal, BNP elevated to 925, troponin elevated 522, lactic acid elevated to 2 CT angio chest did not show evidence of pulm embolism but showed stable right apical consolidation consistent with treated lung cancer. EKG showed sinus tachycardia. Admitted to [facility]   Subjective: Patient was seen and examined this morning.   Lying on bed.  Breathing easy.  Cough improving.  On low-flow oxygen today.  No recurrence of chest pain.  Pending cardiac cath tomorrow  Assessment and plan: Acute exacerbation of COPD Known history of COPD, presented with progressive worsening respiratory symptoms. No evidence of pneumonia on chest imaging. Clinically he had wheezing and hence he was started on tapering course of IV Solu-Medrol.  Currently on 40 mg daily.  Continue same for today.   PTA, he was on prednisone 5 mg daily, beclomethasone inhalational twice daily, Stiolto twice daily as well as azithromycin 250 mg every other day. Home meds resumed  Continue bronchodilators, Mucinex. Albuterol was replaced with Xopenex because of tachycardia Not on  antibiotics.  Lactic acid normalized.  Recent Labs  Lab 04/22/22 1945 04/23/22 0700 04/24/22 0445 04/25/22 0500 04/26/22 0445  WBC 7.6 6.7 15.4* 8.4 8.0  LATICACIDVEN 2.0*  --  1.1  --   --     Acute respiratory failure with hypoxia Currently requiring 2 to 3 L oxygen to maintain O2 sat.  Not on supplemental oxygen at home. Check ambulatory oxygen requirement Follows up with pulmonologist Dr. Josefina Do as at [facility]  NSTEMI (non-ST elevated myocardial infarction)  New drop in EF Patient complained of chest pain on admission.  Troponin was elevated.  Chest pain and troponin were initially suspected to be secondary to demand ischemia from tachycardia and acute COPD exacerbation Patient's cardiac risk factors include hypertension and diabetes mellitus On admission, patient was started on heparin drip. Echocardiogram obtained yesterday showed a new drop in EF to 40 to 45% but could not comment on wall motion abnormality. 12/15, patient had recurrence of chest pain.  Heparin drip was restarted.  Cardiology was consulted.  Tentative plan of cardiac catheterization Monday. Continue metoprolol, aspirin and statin.   Recent Labs    04/24/22 0445 04/24/22 1142  TROPONINIHS 179* 136*    GERD PTA on Prilosec Continue the same  Essential hypertension Continue lisinopril and metoprolol  Controlled type 2 diabetes mellitus Hyperglycemia due to steroids A1c 6.4 on 11/20/2021 PTA on metformin which is currently on hold Continue sliding scale insulin with Accu-Cheks Recent Labs  Lab 04/25/22 1150 04/25/22 1653 04/25/22 2053 04/26/22 0820 04/26/22 1204  GLUCAP 162* 184* 195* 106* 131*    GERD (gastroesophageal reflux disease) Continue PPI  History of lung  cancer Had radiation.  In remission.  Follows up with Dr. Baird Cancer at [place] of care   Code Status: Full Code    Mobility: Encourage ambulation  Scheduled Meds:  abiraterone acetate  1,000 mg Oral QAC  breakfast   arformoterol  15 mcg Nebulization BID   And   umeclidinium bromide  1 puff Inhalation Daily   aspirin EC  81 mg Oral Daily   azithromycin  250 mg Oral QODAY   benzonatate  100 mg Oral TID   budesonide (PULMICORT) nebulizer solution  0.25 mg Nebulization BID   cholecalciferol  2,000 Units Oral Daily   gabapentin  300 mg Oral QHS   insulin aspart  0-15 Units Subcutaneous TID WC   losartan  25 mg Oral Daily   magnesium oxide  400 mg Oral Daily   melatonin  10 mg Oral QHS   methylPREDNISolone (SOLU-MEDROL) injection  40 mg Intravenous Daily   metoprolol succinate  25 mg Oral Daily   omega-3 acid ethyl esters  1 g Oral Daily   pantoprazole  40 mg Oral Daily   pravastatin  40 mg Oral q1800   sodium chloride flush  3 mL Intravenous Q12H   sodium chloride flush  3 mL Intravenous Q12H    PRN meds: sodium chloride, acetaminophen **OR** acetaminophen, alum & mag hydroxide-simeth, guaiFENesin-dextromethorphan, levalbuterol, ondansetron **OR** ondansetron (ZOFRAN) IV, sodium chloride flush   Infusions:   sodium chloride     heparin 1,300 Units/hr (04/26/22 1307)    Skin assessment:     Nutritional status:  Body mass index is 27.32 kg/m.          Diet:  Diet Order             Diet NPO time specified  Diet effective midnight           Diet heart healthy/carb modified Room service appropriate? Yes; Fluid consistency: Thin  Diet effective now                   DVT prophylaxis: Currently on heparin drip  Antimicrobials: None Fluid: None Consultants: None Family Communication: None at bedside  Status is: Inpatient  Continue in-hospital care because: Pending cardiac catheterization Monday level of care: Progressive   Dispo: The patient is from: Home              Anticipated d/c is to: Pending clinical course.               Patient currently is not medically stable to d/c.   Difficult to place patient No    Antimicrobials: Anti-infectives (From  admission, onward)    Start     Dose/Rate Route Frequency Ordered Stop   04/24/22 1145  azithromycin (ZITHROMAX) tablet 250 mg        250 mg Oral Every other day 04/24/22 1046         Objective: Vitals:   04/26/22 0830 04/26/22 1404  BP:  (!) 116/58  Pulse:  71  Resp:  18  Temp:  98.1 F (36.7 C)  SpO2: 98% 97%    Intake/Output Summary (Last 24 hours) at 04/26/2022 1415 Last data filed at 04/26/2022 1307 Gross per 24 hour  Intake 1197.85 ml  Output 1325 ml  Net -127.15 ml    Filed Weights   04/22/22 2326  Weight: 83.9 kg   Weight change:  Body mass index is 27.32 kg/m.   Physical Exam: General exam: Pleasant, elderly Caucasian male.  Not  in physical distress Skin: No rashes, lesions or ulcers. HEENT: Atraumatic, normocephalic, no obvious bleeding Lungs: Clear to auscultation bilaterally.  Improving cough.  No wheezing. CVS: Regular rate and rhythm, no murmur GI/Abd soft, nontender, nondistended, bowel sound present CNS: Alert, awake, oriented x 3 Psychiatry: Mood appropriate Extremities: No pedal edema, no calf tenderness  Data Review: I have personally reviewed the laboratory data and studies available.  F/u labs ordered Unresulted Labs (From admission, onward)     Start     Ordered   04/26/22 1443  Basic metabolic panel  Daily,   R     Question:  Specimen collection method  Answer:  Lab=Lab collect   04/25/22 0704   04/26/22 0500  Heparin level (unfractionated)  Daily,   R     Question:  Specimen collection method  Answer:  Lab=Lab collect   04/25/22 1406   04/24/22 0500  CBC  Daily,   R      04/22/22 2318            Total time spent in review of labs and imaging, patient evaluation, formulation of plan, documentation and communication with family -85 minutes  Signed, Terrilee Croak, MD [facility] 04/26/2022

## 2022-04-26 NOTE — Progress Notes (Signed)
Rounding Note    Patient Name: Alan Hurst Date of Encounter: 04/26/2022  [place] Cardiologist: None   Subjective   BP 121/70.  Cr 1.05, Hgb 10. Denies any chest pain.  Reports dyspnea improving  Inpatient Medications    Scheduled Meds:  abiraterone acetate  1,000 mg Oral QAC breakfast   arformoterol  15 mcg Nebulization BID   And   umeclidinium bromide  1 puff Inhalation Daily   aspirin EC  81 mg Oral Daily   azithromycin  250 mg Oral QODAY   benzonatate  100 mg Oral TID   budesonide (PULMICORT) nebulizer solution  0.25 mg Nebulization BID   cholecalciferol  2,000 Units Oral Daily   gabapentin  300 mg Oral QHS   insulin aspart  0-15 Units Subcutaneous TID WC   losartan  25 mg Oral Daily   magnesium oxide  400 mg Oral Daily   melatonin  10 mg Oral QHS   methylPREDNISolone (SOLU-MEDROL) injection  40 mg Intravenous Daily   metoprolol tartrate  25 mg Oral BID   omega-3 acid ethyl esters  1 g Oral Daily   pantoprazole  40 mg Oral Daily   pravastatin  40 mg Oral q1800   sodium chloride flush  3 mL Intravenous Q12H   Continuous Infusions:  sodium chloride     heparin 1,300 Units/hr (04/26/22 0700)   PRN Meds: sodium chloride, acetaminophen **OR** acetaminophen, alum & mag hydroxide-simeth, guaiFENesin-dextromethorphan, levalbuterol, ondansetron **OR** ondansetron (ZOFRAN) IV, sodium chloride flush   Vital Signs    Vitals:   04/25/22 2000 04/25/22 2043 04/26/22 0449 04/26/22 0830  BP:  125/70 121/70   Pulse:  73 62   Resp:  18 18   Temp:  98.2 F (36.8 C) 97.9 F (36.6 C)   TempSrc:  Oral Oral   SpO2: 96% 100%  98%  Weight:      Height:        Intake/Output Summary (Last 24 hours) at 04/26/2022 1015 Last data filed at 04/26/2022 0700 Gross per 24 hour  Intake 684.76 ml  Output 725 ml  Net -40.24 ml       04/22/2022   11:26 PM 02/24/2022    3:22 PM 11/20/2021    9:32 AM  Last 3 Weights  Weight (lbs) 185 lb 180 lb 179 lb  Weight  (kg) 83.915 kg 81.647 kg 81.194 kg      Telemetry    NSR - Personally Reviewed  ECG    No new EKG - Personally Reviewed  Physical Exam   GEN: No acute distress.   Neck: No JVD Cardiac: RRR, no murmurs, rubs, or gallops.  Respiratory: CTAB GI: Soft, nontender, non-distended  MS: No edema; No deformity. Neuro:  Nonfocal  Psych: Normal affect   Labs    High Sensitivity Troponin:   Recent Labs  Lab 04/22/22 1945 04/22/22 2258 04/23/22 0700 04/24/22 0445 04/24/22 1142  TROPONINIHS 522* 506* 300* 179* 136*      Chemistry Recent Labs  Lab 04/22/22 1945 04/22/22 2258 04/23/22 0700 04/24/22 0445 04/26/22 0445  NA 137  --  137 139 136  K 3.1*  --  4.0 4.5 4.0  CL 104  --  104 107 106  CO2 21*  --  23 23 23   GLUCOSE 168*  --  161* 151* 133*  BUN 10  --  14 34* 36*  CREATININE 0.90  --  1.01 1.02 1.05  CALCIUM 9.3  --  9.2 9.0 8.4*  MG  --  2.3  --   --   --   PROT 6.7  --   --   --   --   ALBUMIN 3.1*  --   --   --   --   AST 31  --   --   --   --   ALT 19  --   --   --   --   ALKPHOS 60  --   --   --   --   BILITOT 0.8  --   --   --   --   GFRNONAA >60  --  >60 >60 >60  ANIONGAP 12  --  10 9 7      Lipids  Recent Labs  Lab 04/25/22 0500  CHOL 137  TRIG 71  HDL 60  LDLCALC 63  CHOLHDL 2.3    Hematology Recent Labs  Lab 04/24/22 0445 04/25/22 0500 04/26/22 0445  WBC 15.4* 8.4 8.0  RBC 4.07* 3.71* 3.62*  HGB 11.2* 10.2* 10.0*  HCT 34.6* 32.2* 31.7*  MCV 85.0 86.8 87.6  MCH 27.5 27.5 27.6  MCHC 32.4 31.7 31.5  RDW 15.0 14.9 14.9  PLT 291 251 259    Thyroid  Recent Labs  Lab 04/25/22 0500  TSH 0.157*  FREET4 1.05    BNP Recent Labs  Lab 04/22/22 1945  BNP 925.5*     DDimer  Recent Labs  Lab 04/22/22 2258  DDIMER 0.86*      Radiology    No results found.  Cardiac Studies     Patient Profile     76 y.o. 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  EKG unremarkable.  Echo shows new systolic dysfunction (EF 22-63%).  Could repesent demand ischemia in setting of his COPD exacerbation, but with new systolic heart failure recommend ischemia evaluation.  -Plan has been for cath once recovered from respiratory issues.  He is significantly improved, will plan for cath tomorrow.  Risks and benefits of cardiac catheterization have been discussed with the patient.  These include bleeding, infection, kidney damage, stroke, heart attack, death.  The patient understands these risks and is willing to proceed. -Continue ASA, heparin gtt, statin  Acute systolic heart failure: Echo shows new systolic dysfunction (EF 33-54%) -On metoprolol, will consolidate to toprol XL  -Switched from lisinopril to losartan, likely transition to entresto after cath  Acute respiratory failure with hypoxia: Improving. Due to COPD exacerbation, treatment per primary team  HLD: on pravastatin 40 mg daily.  LDL 63   For questions or updates, please contact [place] Please consult [email or web address] for contact info under        Signed, Donato Heinz, MD  04/26/2022, 10:15 AM

## 2022-04-26 NOTE — Plan of Care (Signed)
  Problem: Education: Goal: Knowledge of the prescribed therapeutic regimen will improve Outcome: Progressing   Problem: Activity: Goal: Ability to tolerate increased activity will improve Outcome: Progressing Goal: Will verbalize the importance of balancing activity with adequate rest periods Outcome: Progressing   Problem: Respiratory: Goal: Ability to maintain a clear airway will improve Outcome: Progressing

## 2022-04-26 NOTE — Progress Notes (Signed)
ANTICOAGULATION CONSULT NOTE - Follow Up Consult  Pharmacy Consult for Heparin Indication: NSTEMI  Allergies  Allergen Reactions   No Known Allergies    Lemon Oil Rash    Patient Measurements: Height: 5\' 9"  (175.3 cm) Weight: 83.9 kg (185 lb) IBW/kg (Calculated) : 70.7 Heparin Dosing Weight: TBW  Vital Signs: Temp: 97.9 F (36.6 C) (12/17 0449) Temp Source: Oral (12/17 0449) BP: 121/70 (12/17 0449) Pulse Rate: 62 (12/17 0449)  Labs: Recent Labs    04/23/22 0700 04/23/22 0800 04/24/22 0445 04/24/22 1142 04/24/22 1939 04/25/22 0500 04/25/22 1251 04/26/22 0445  HGB 11.0*  --  11.2*  --   --  10.2*  --  10.0*  HCT 33.7*  --  34.6*  --   --  32.2*  --  31.7*  PLT 239  --  291  --   --  251  --  259  HEPARINUNFRC  --    < > 0.43  --    < > 0.40 0.52 0.42  CREATININE 1.01  --  1.02  --   --   --   --  1.05  TROPONINIHS 300*  --  179* 136*  --   --   --   --    < > = values in this interval not displayed.     Estimated Creatinine Clearance: 59.9 mL/min (by C-G formula based on SCr of 1.05 mg/dL).   Medications:  Infusions:   sodium chloride     heparin 1,300 Units/hr (04/26/22 0500)    Assessment: Patient is a 76 y.o M with hx COPD who presented to the ED on 04/22/22 with c/o cough,chest tightness and SOB. In the ED, he was found to be tachycardic with elevated troponin. He's currently on heparin drip for NSTEMI.   Today, 04/26/2022: Heparin level remains therapeutic and stable on heparin 1300 units/hr CBC: Hgb low but stable; Plt stable WNL No bleeding or infusion issues reported   Goal of Therapy:  Heparin level 0.3-0.7 units/ml Monitor platelets by anticoagulation protocol: Yes   Plan:  Continue heparin IV infusion at 1300 units/hr Daily heparin level and CBC Cards planning for cath on Monday   Reuel Boom, PharmD, BCPS [phone] 04/26/2022, 6:12 AM

## 2022-04-26 NOTE — Progress Notes (Signed)
Notified (in person) Dr. Gardiner Rhyme, cardiologist; metoprolol had been given, just prior to, or same time d/c order had been placed.

## 2022-04-26 NOTE — H&P (View-Only) (Signed)
Rounding Note    Patient Name: Alan Hurst Date of Encounter: 04/26/2022  [place] Cardiologist: None   Subjective   BP 121/70.  Cr 1.05, Hgb 10. Denies any chest pain.  Reports dyspnea improving  Inpatient Medications    Scheduled Meds:  abiraterone acetate  1,000 mg Oral QAC breakfast   arformoterol  15 mcg Nebulization BID   And   umeclidinium bromide  1 puff Inhalation Daily   aspirin EC  81 mg Oral Daily   azithromycin  250 mg Oral QODAY   benzonatate  100 mg Oral TID   budesonide (PULMICORT) nebulizer solution  0.25 mg Nebulization BID   cholecalciferol  2,000 Units Oral Daily   gabapentin  300 mg Oral QHS   insulin aspart  0-15 Units Subcutaneous TID WC   losartan  25 mg Oral Daily   magnesium oxide  400 mg Oral Daily   melatonin  10 mg Oral QHS   methylPREDNISolone (SOLU-MEDROL) injection  40 mg Intravenous Daily   metoprolol tartrate  25 mg Oral BID   omega-3 acid ethyl esters  1 g Oral Daily   pantoprazole  40 mg Oral Daily   pravastatin  40 mg Oral q1800   sodium chloride flush  3 mL Intravenous Q12H   Continuous Infusions:  sodium chloride     heparin 1,300 Units/hr (04/26/22 0700)   PRN Meds: sodium chloride, acetaminophen **OR** acetaminophen, alum & mag hydroxide-simeth, guaiFENesin-dextromethorphan, levalbuterol, ondansetron **OR** ondansetron (ZOFRAN) IV, sodium chloride flush   Vital Signs    Vitals:   04/25/22 2000 04/25/22 2043 04/26/22 0449 04/26/22 0830  BP:  125/70 121/70   Pulse:  73 62   Resp:  18 18   Temp:  98.2 F (36.8 C) 97.9 F (36.6 C)   TempSrc:  Oral Oral   SpO2: 96% 100%  98%  Weight:      Height:        Intake/Output Summary (Last 24 hours) at 04/26/2022 1015 Last data filed at 04/26/2022 0700 Gross per 24 hour  Intake 684.76 ml  Output 725 ml  Net -40.24 ml       04/22/2022   11:26 PM 02/24/2022    3:22 PM 11/20/2021    9:32 AM  Last 3 Weights  Weight (lbs) 185 lb 180 lb 179 lb  Weight  (kg) 83.915 kg 81.647 kg 81.194 kg      Telemetry    NSR - Personally Reviewed  ECG    No new EKG - Personally Reviewed  Physical Exam   GEN: No acute distress.   Neck: No JVD Cardiac: RRR, no murmurs, rubs, or gallops.  Respiratory: CTAB GI: Soft, nontender, non-distended  MS: No edema; No deformity. Neuro:  Nonfocal  Psych: Normal affect   Labs    High Sensitivity Troponin:   Recent Labs  Lab 04/22/22 1945 04/22/22 2258 04/23/22 0700 04/24/22 0445 04/24/22 1142  TROPONINIHS 522* 506* 300* 179* 136*      Chemistry Recent Labs  Lab 04/22/22 1945 04/22/22 2258 04/23/22 0700 04/24/22 0445 04/26/22 0445  NA 137  --  137 139 136  K 3.1*  --  4.0 4.5 4.0  CL 104  --  104 107 106  CO2 21*  --  23 23 23   GLUCOSE 168*  --  161* 151* 133*  BUN 10  --  14 34* 36*  CREATININE 0.90  --  1.01 1.02 1.05  CALCIUM 9.3  --  9.2 9.0 8.4*  MG  --  2.3  --   --   --   PROT 6.7  --   --   --   --   ALBUMIN 3.1*  --   --   --   --   AST 31  --   --   --   --   ALT 19  --   --   --   --   ALKPHOS 60  --   --   --   --   BILITOT 0.8  --   --   --   --   GFRNONAA >60  --  >60 >60 >60  ANIONGAP 12  --  10 9 7      Lipids  Recent Labs  Lab 04/25/22 0500  CHOL 137  TRIG 71  HDL 60  LDLCALC 63  CHOLHDL 2.3    Hematology Recent Labs  Lab 04/24/22 0445 04/25/22 0500 04/26/22 0445  WBC 15.4* 8.4 8.0  RBC 4.07* 3.71* 3.62*  HGB 11.2* 10.2* 10.0*  HCT 34.6* 32.2* 31.7*  MCV 85.0 86.8 87.6  MCH 27.5 27.5 27.6  MCHC 32.4 31.7 31.5  RDW 15.0 14.9 14.9  PLT 291 251 259    Thyroid  Recent Labs  Lab 04/25/22 0500  TSH 0.157*  FREET4 1.05    BNP Recent Labs  Lab 04/22/22 1945  BNP 925.5*     DDimer  Recent Labs  Lab 04/22/22 2258  DDIMER 0.86*      Radiology    No results found.  Cardiac Studies     Patient Profile     76 y.o. 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  EKG unremarkable.  Echo shows new systolic dysfunction (EF 81-10%).  Could repesent demand ischemia in setting of his COPD exacerbation, but with new systolic heart failure recommend ischemia evaluation.  -Plan has been for cath once recovered from respiratory issues.  He is significantly improved, will plan for cath tomorrow.  Risks and benefits of cardiac catheterization have been discussed with the patient.  These include bleeding, infection, kidney damage, stroke, heart attack, death.  The patient understands these risks and is willing to proceed. -Continue ASA, heparin gtt, statin  Acute systolic heart failure: Echo shows new systolic dysfunction (EF 31-59%) -On metoprolol, will consolidate to toprol XL  -Switched from lisinopril to losartan, likely transition to entresto after cath  Acute respiratory failure with hypoxia: Improving. Due to COPD exacerbation, treatment per primary team  HLD: on pravastatin 40 mg daily.  LDL 63   For questions or updates, please contact [place] Please consult [email or web address] for contact info under        Signed, Donato Heinz, MD  04/26/2022, 10:15 AM

## 2022-04-27 ENCOUNTER — Inpatient Hospital Stay (HOSPITAL_COMMUNITY)
Admission: EM | Disposition: A | Discharge status: Home / Self Care | Payer: Self-pay | Source: Home / Self Care | Attending: Internal Medicine

## 2022-04-27 ENCOUNTER — Inpatient Hospital Stay (HOSPITAL_COMMUNITY): Payer: Medicare Other

## 2022-04-27 ENCOUNTER — Encounter (HOSPITAL_COMMUNITY): Payer: Self-pay | Admitting: Cardiovascular Disease

## 2022-04-27 DIAGNOSIS — R7989 Other specified abnormal findings of blood chemistry: Secondary | ICD-10-CM | POA: Diagnosis not present

## 2022-04-27 DIAGNOSIS — I2510 Atherosclerotic heart disease of native coronary artery without angina pectoris: Secondary | ICD-10-CM

## 2022-04-27 DIAGNOSIS — J441 Chronic obstructive pulmonary disease with (acute) exacerbation: Secondary | ICD-10-CM | POA: Diagnosis not present

## 2022-04-27 HISTORY — PX: LEFT HEART CATH AND CORONARY ANGIOGRAPHY: CATH118249

## 2022-04-27 LAB — CULTURE, BLOOD (ROUTINE X 2)
Culture: NO GROWTH
Culture: NO GROWTH
Special Requests: ADEQUATE

## 2022-04-27 LAB — CBC
HCT: 32.5 % — ABNORMAL LOW (ref 39.0–52.0)
Hemoglobin: 10.7 g/dL — ABNORMAL LOW (ref 13.0–17.0)
MCH: 27.7 pg (ref 26.0–34.0)
MCHC: 32.9 g/dL (ref 30.0–36.0)
MCV: 84.2 fL (ref 80.0–100.0)
Platelets: 269 10*3/uL (ref 150–400)
RBC: 3.86 MIL/uL — ABNORMAL LOW (ref 4.22–5.81)
RDW: 14.8 % (ref 11.5–15.5)
WBC: 7.9 10*3/uL (ref 4.0–10.5)
nRBC: 0 % (ref 0.0–0.2)

## 2022-04-27 LAB — BASIC METABOLIC PANEL
Anion gap: 5 (ref 5–15)
BUN: 33 mg/dL — ABNORMAL HIGH (ref 8–23)
CO2: 25 mmol/L (ref 22–32)
Calcium: 8.7 mg/dL — ABNORMAL LOW (ref 8.9–10.3)
Chloride: 105 mmol/L (ref 98–111)
Creatinine, Ser: 1.03 mg/dL (ref 0.61–1.24)
GFR, Estimated: >60 mL/min (ref >60)
Glucose, Bld: 142 mg/dL — ABNORMAL HIGH (ref 70–99)
Potassium: 4 mmol/L (ref 3.5–5.1)
Sodium: 135 mmol/L (ref 135–145)

## 2022-04-27 LAB — GLUCOSE, CAPILLARY
Glucose-Capillary: 121 mg/dL — ABNORMAL HIGH (ref 70–99)
Glucose-Capillary: 118 mg/dL — ABNORMAL HIGH (ref 70–99)
Glucose-Capillary: 168 mg/dL — ABNORMAL HIGH (ref 70–99)
Glucose-Capillary: 196 mg/dL — ABNORMAL HIGH (ref 70–99)

## 2022-04-27 LAB — HEPARIN LEVEL (UNFRACTIONATED): Heparin Unfractionated: 0.58 IU/mL (ref 0.30–0.70)

## 2022-04-27 SURGERY — LEFT HEART CATH AND CORONARY ANGIOGRAPHY
Anesthesia: LOCAL

## 2022-04-27 MED ORDER — FENTANYL CITRATE (PF) 100 MCG/2ML IJ SOLN
INTRAMUSCULAR | Status: DC | PRN
  Administered 2022-04-27: 25 ug
  Administered 2022-04-27: 25 ug via INTRAVENOUS

## 2022-04-27 MED ORDER — SODIUM CHLORIDE 0.9 % IV SOLN: INTRAVENOUS | Status: AC

## 2022-04-27 MED ORDER — HYDRALAZINE HCL 20 MG/ML IJ SOLN: 10.0000 mg | INTRAMUSCULAR | Status: AC | PRN

## 2022-04-27 MED ORDER — SODIUM CHLORIDE 0.9% FLUSH
3.0000 mL | Freq: Two times a day (BID) | INTRAVENOUS | Status: DC
  Administered 2022-04-27 – 2022-04-28 (×2): 3 mL via INTRAVENOUS

## 2022-04-27 MED ORDER — ENOXAPARIN SODIUM 40 MG/0.4ML IJ SOSY
40.0000 mg | PREFILLED_SYRINGE | INTRAMUSCULAR | Status: DC
  Administered 2022-04-27: 40 mg via SUBCUTANEOUS
  Filled 2022-04-27: qty 0.4

## 2022-04-27 MED ORDER — MORPHINE SULFATE (PF) 2 MG/ML IV SOLN
1.0000 mg | Freq: Once | INTRAVENOUS | Status: AC
  Administered 2022-04-27: 1 mg via INTRAVENOUS
  Filled 2022-04-27: qty 1

## 2022-04-27 MED ORDER — PREDNISONE 20 MG PO TABS
40.0000 mg | ORAL_TABLET | Freq: Every day | ORAL | Status: DC
  Administered 2022-04-28: 40 mg via ORAL
  Filled 2022-04-27: qty 2

## 2022-04-27 MED ORDER — MORPHINE SULFATE (PF) 2 MG/ML IV SOLN
1.0000 mg | INTRAVENOUS | Status: DC | PRN
  Administered 2022-04-27 – 2022-04-28 (×2): 1 mg via INTRAVENOUS
  Filled 2022-04-27 (×3): qty 1

## 2022-04-27 MED ORDER — HEPARIN SODIUM (PORCINE) 1000 UNIT/ML IJ SOLN
INTRAMUSCULAR | Status: DC | PRN
  Administered 2022-04-27: 4500 [IU] via INTRAVENOUS

## 2022-04-27 MED ORDER — VERAPAMIL HCL 2.5 MG/ML IV SOLN
INTRAVENOUS | Status: DC | PRN
  Administered 2022-04-27: 10 mL via INTRA_ARTERIAL

## 2022-04-27 MED ORDER — SODIUM CHLORIDE 0.9% FLUSH: 3.0000 mL | INTRAVENOUS | Status: DC | PRN

## 2022-04-27 MED ORDER — LABETALOL HCL 5 MG/ML IV SOLN: 10.0000 mg | INTRAVENOUS | Status: AC | PRN

## 2022-04-27 MED ORDER — MIDAZOLAM HCL 2 MG/2ML IJ SOLN
INTRAMUSCULAR | Status: DC | PRN
  Administered 2022-04-27: 1 mg via INTRAVENOUS

## 2022-04-27 MED ORDER — SODIUM CHLORIDE 0.9 % IV SOLN: 250.0000 mL | INTRAVENOUS | Status: DC | PRN

## 2022-04-27 MED ORDER — HEPARIN (PORCINE) IN NACL 1000-0.9 UT/500ML-% IV SOLN
INTRAVENOUS | Status: DC | PRN
  Administered 2022-04-27 (×2): 500 mL

## 2022-04-27 MED ORDER — LIDOCAINE HCL (PF) 1 % IJ SOLN
INTRAMUSCULAR | Status: DC | PRN
  Administered 2022-04-27: 2 mL

## 2022-04-27 MED ORDER — IOHEXOL 350 MG/ML SOLN
INTRAVENOUS | Status: DC | PRN
  Administered 2022-04-27: 60 mL

## 2022-04-27 SURGICAL SUPPLY — 11 items
CATH 5FR JL3.5 JR4 ANG PIG MP (CATHETERS) IMPLANT
DEVICE RAD COMP TR BAND LRG (VASCULAR PRODUCTS) IMPLANT
ELECT DEFIB PAD ADLT CADENCE (PAD) IMPLANT
GLIDESHEATH SLEND SS 6F .021 (SHEATH) IMPLANT
GLIDESHEATH SLENDER 7FR .021G (SHEATH) IMPLANT
GUIDEWIRE INQWIRE 1.5J.035X260 (WIRE) IMPLANT
INQWIRE 1.5J .035X260CM (WIRE) ×1
KIT HEART LEFT (KITS) ×2 IMPLANT
PACK CARDIAC CATHETERIZATION (CUSTOM PROCEDURE TRAY) ×2 IMPLANT
TRANSDUCER W/STOPCOCK (MISCELLANEOUS) ×2 IMPLANT
TUBING CIL FLEX 10 FLL-RA (TUBING) ×2 IMPLANT

## 2022-04-27 NOTE — Progress Notes (Signed)
Received patient from [facility] via [place].  Skin warm and dry ,resp even and  unlabored, denies any chest pain but has  8-10 left hip pain.  IVF of NS  and Heparin infusing  in the left arm.  Consent signed, patient on bedside monitor and call bell in reach.  Patient waiting for cath procedure.

## 2022-04-27 NOTE — Interval H&P Note (Signed)
History and Physical Interval Note:  04/27/2022 10:04 AM  Alan Hurst  has presented today for surgery, with the diagnosis of unstable angina.  The various methods of treatment have been discussed with the patient and family. After consideration of risks, benefits and other options for treatment, the patient has consented to  Procedure(s): LEFT HEART CATH AND CORONARY ANGIOGRAPHY (N/A) as a surgical intervention.  The patient's history has been reviewed, patient examined, no change in status, stable for surgery.  I have reviewed the patient's chart and labs.  Questions were answered to the patient's satisfaction.    Cath Lab Visit (complete for each Cath Lab visit)  Clinical Evaluation Leading to the Procedure:   ACS: Yes.    Non-ACS:    Anginal Classification: CCS II  Anti-ischemic medical therapy: Minimal Therapy (1 class of medications)  Non-Invasive Test Results: No non-invasive testing performed  Prior CABG: No previous CABG        Alan Hurst

## 2022-04-27 NOTE — Progress Notes (Signed)
PROGRESS NOTE  Alan Hurst  DOB: [birth year]  PCP: Lorrene Reid, PA-C VFI:[id number]  DOA: 04/22/2022  LOS: 5 days  Hospital Day: 6  Brief narrative: Alan Hurst is a 76 y.o. male with PMH significant for DM2, HTN, HLD, COPD, history of lung cancer-treated 12/13, patient presented to the ED for shortness of breath, productive cough, intermittent midsternal chest pain for a week.  Also reports bilateral lower extremity swelling (Rt >> Lt).he has been using his albuterol metered-dose inhaler without any significant improvement in his symptoms. 12/12, he was seen at the urgent care clinic and was prescribed a steroid inhaler and Mucinex but did not feel relief.  In the ED, patient was afebrile, tachycardic to 130s, blood pressure in 150s, required 3 L oxygen manage cannula Initial labs with unremarkable CBC, potassium low at 3.1, renal function normal, BNP elevated to 925, troponin elevated 522, lactic acid elevated to 2 CT angio chest did not show evidence of pulm embolism but showed stable right apical consolidation consistent with treated lung cancer. EKG showed sinus tachycardia. Admitted to [facility].   Subjective: Patient was seen and examined this afternoon.  Underwent cardiac cath today.  Mild nonobstructive CAD.  Nonischemic cardiomyopathy noted. At the time of my evaluation this morning, patient was hurting a lot in his left hip.  He states he had hip replacement 30 years ago.  He acutely started hurting last night.  He was given a dose of fentanyl earlier.  Excruciating pain at the time of my evaluation  Assessment and plan: Left hip pain Patient has excruciating pain on the left hip since last night.  1 dose of IV morphine given at this time.  I ordered for every 4 as needed.  X-ray ordered as well.  Acute exacerbation of COPD Known history of COPD, presented with progressive worsening respiratory symptoms. No evidence of pneumonia on chest imaging. Clinically he had  wheezing and hence he was started on tapering course of IV Solu-Medrol.  Currently on 40 mg daily.  Switch to oral prednisone 40 mg daily today. PTA, he was on prednisone 5 mg daily, beclomethasone inhalational twice daily, Stiolto twice daily as well as azithromycin 250 mg every other day. Home meds resumed  Continue bronchodilators, Mucinex. Albuterol was replaced with Xopenex because of tachycardia Not on antibiotics.  Lactic acid normalized.  Recent Labs  Lab 04/22/22 1945 04/23/22 0700 04/24/22 0445 04/25/22 0500 04/26/22 0445 04/27/22 0503  WBC 7.6 6.7 15.4* 8.4 8.0 7.9  LATICACIDVEN 2.0*  --  1.1  --   --   --    Acute respiratory failure with hypoxia Currently requiring 2 to 3 L oxygen to maintain O2 sat.  Not on supplemental oxygen at home. Check ambulatory oxygen requirement Follows up with pulmonologist Dr. Josefina Do as at [facility]  NSTEMI (non-ST elevated myocardial infarction)  New drop in EF Patient complained of chest pain on admission.  Troponin was elevated.  Chest pain and troponin were initially suspected to be secondary to demand ischemia from tachycardia and acute COPD exacerbation Patient's cardiac risk factors include hypertension and diabetes mellitus On admission, patient was started on heparin drip. Echocardiogram obtained yesterday showed a new drop in EF to 40 to 45% but could not comment on wall motion abnormality. 12/15, patient had recurrence of chest pain.  Heparin drip was restarted.  Cardiology was consulted.  12/18, underwent cardiac cath this morning.  Nonobstructive CAD. Continue metoprolol, aspirin and statin.    GERD PTA on Prilosec Continue  the same  Essential hypertension Continue lisinopril and metoprolol  Controlled type 2 diabetes mellitus Hyperglycemia due to steroids A1c 6.4 on 11/20/2021 PTA on metformin which is currently on hold Continue sliding scale insulin with Accu-Cheks Recent Labs  Lab 04/26/22 1204 04/26/22 1657  04/26/22 2023 04/27/22 0717 04/27/22 0928  GLUCAP 131* 185* 250* 121* 118*   GERD (gastroesophageal reflux disease) Continue PPI  History of lung cancer Had radiation.  In remission.  Follows up with Dr. Baird Cancer at [facility] of care   Code Status: Full Code    Mobility: Encourage ambulation  Scheduled Meds:  abiraterone acetate  1,000 mg Oral QAC breakfast   arformoterol  15 mcg Nebulization BID   And   umeclidinium bromide  1 puff Inhalation Daily   aspirin EC  81 mg Oral Daily   azithromycin  250 mg Oral QODAY   benzonatate  100 mg Oral TID   budesonide (PULMICORT) nebulizer solution  0.25 mg Nebulization BID   cholecalciferol  2,000 Units Oral Daily   gabapentin  300 mg Oral QHS   insulin aspart  0-15 Units Subcutaneous TID WC   losartan  25 mg Oral Daily   magnesium oxide  400 mg Oral Daily   melatonin  10 mg Oral QHS   metoprolol succinate  25 mg Oral Daily   omega-3 acid ethyl esters  1 g Oral Daily   pantoprazole  40 mg Oral Daily   pravastatin  40 mg Oral q1800   [START ON 04/28/2022] predniSONE  40 mg Oral Q breakfast   sodium chloride flush  3 mL Intravenous Q12H   sodium chloride flush  3 mL Intravenous Q12H    PRN meds: sodium chloride, acetaminophen **OR** acetaminophen, alum & mag hydroxide-simeth, guaiFENesin-dextromethorphan, hydrALAZINE, labetalol, levalbuterol, morphine injection, ondansetron **OR** ondansetron (ZOFRAN) IV, sodium chloride flush   Infusions:   sodium chloride     sodium chloride 50 mL/hr at 04/27/22 1441   heparin 1,300 Units/hr (04/27/22 0817)    Skin assessment:     Nutritional status:  Body mass index is 27.74 kg/m.          Diet:  Diet Order     None       DVT prophylaxis: Currently on heparin drip  Antimicrobials: None Fluid: None Consultants: None Family Communication: None at bedside  Status is: Inpatient  Continue in-hospital care because: Cardiac cath today.  Excruciating pain in left  hip since last night level of care: Progressive   Dispo: The patient is from: Home              Anticipated d/c is to: Pending clinical course.               Patient currently is not medically stable to d/c.   Difficult to place patient No    Antimicrobials: Anti-infectives (From admission, onward)    Start     Dose/Rate Route Frequency Ordered Stop   04/24/22 1145  azithromycin (ZITHROMAX) tablet 250 mg        250 mg Oral Every other day 04/24/22 1046         Objective: Vitals:   04/27/22 1250 04/27/22 1353  BP: (!) 172/54 (!) 145/72  Pulse: 66 65  Resp: 15 18  Temp:  98 F (36.7 C)  SpO2: 91% 100%    Intake/Output Summary (Last 24 hours) at 04/27/2022 1452 Last data filed at 04/27/2022 0824 Gross per 24 hour  Intake 660.08 ml  Output  1500 ml  Net -839.92 ml   Filed Weights   04/22/22 2326 04/26/22 2016  Weight: 83.9 kg 85.2 kg   Weight change:  Body mass index is 27.74 kg/m.   Physical Exam: General exam: Pleasant, elderly Caucasian male.  Not in physical distress Skin: No rashes, lesions or ulcers. HEENT: Atraumatic, normocephalic, no obvious bleeding Lungs: Clear to auscultation bilaterally.  Improving cough.  No wheezing. CVS: Regular rate and rhythm, no murmur GI/Abd soft, nontender, nondistended, bowel sound present CNS: Alert, awake, oriented x 3 Psychiatry: Mood appropriate Extremities: No pedal edema, no calf tenderness.  Severe pain in the left hip.  No fall  Data Review: I have personally reviewed the laboratory data and studies available.  F/u labs ordered Unresulted Labs (From admission, onward)     Start     Ordered   04/26/22 1540  Basic metabolic panel  Daily,   R     Question:  Specimen collection method  Answer:  Lab=Lab collect   04/25/22 0704   04/26/22 0500  Heparin level (unfractionated)  Daily,   R     Question:  Specimen collection method  Answer:  Lab=Lab collect   04/25/22 1406   04/24/22 0500  CBC  Daily,   R       04/22/22 2318            Total time spent in review of labs and imaging, patient evaluation, formulation of plan, documentation and communication with family -7 minutes  Signed, Terrilee Croak, MD [facility] 04/27/2022

## 2022-04-27 NOTE — Progress Notes (Signed)
ANTICOAGULATION CONSULT NOTE - Follow Up Consult  Pharmacy Consult for Heparin Indication: NSTEMI  Allergies  Allergen Reactions   No Known Allergies    Lemon Oil Rash    Patient Measurements: Height: 5\' 9"  (175.3 cm) Weight: 85.2 kg (187 lb 13.3 oz) IBW/kg (Calculated) : 70.7 Heparin Dosing Weight: TBW  Vital Signs: Temp: 97.8 F (36.6 C) (12/18 0533) Temp Source: Oral (12/18 0533) BP: 161/88 (12/18 0533) Pulse Rate: 68 (12/18 0533)  Labs: Recent Labs    04/24/22 1142 04/24/22 1939 04/25/22 0500 04/25/22 1251 04/26/22 0445 04/27/22 0503  HGB  --    < > 10.2*  --  10.0* 10.7*  HCT  --   --  32.2*  --  31.7* 32.5*  PLT  --   --  251  --  259 269  HEPARINUNFRC  --    < > 0.40 0.52 0.42 0.58  CREATININE  --   --   --   --  1.05 1.03  TROPONINIHS 136*  --   --   --   --   --    < > = values in this interval not displayed.     Estimated Creatinine Clearance: 66 mL/min (by C-G formula based on SCr of 1.03 mg/dL).   Assessment: Patient is a 76 y.o M with hx COPD who presented to the ED on 04/22/22 with c/o cough,chest tightness and SOB. In the ED, he was found to be tachycardic with elevated troponin. He's currently on heparin drip for NSTEMI.   Today, 04/27/2022: Heparin level = 0.58 units/mL, remains therapeutic on heparin infusion at 1300 units/hr CBC: Hgb low, but stable; Plt WNL No bleeding or infusion issues reported   Goal of Therapy:  Heparin level 0.3-0.7 units/ml Monitor platelets by anticoagulation protocol: Yes   Plan:  Continue IV heparin infusion at 1300 units/hr Daily heparin level and CBC Cards planning for cath today-follow up plans post procedure   Lindell Spar, PharmD, BCPS [phone] 04/27/2022, 7:19 AM

## 2022-04-27 NOTE — Progress Notes (Signed)
TR BAND REMOVAL  LOCATION:    right radial  DEFLATED PER PROTOCOL:    Yes.    TIME BAND OFF / DRESSING APPLIED:    1300pm a clean dry dressing applied with gauze and tegaderm   SITE UPON ARRIVAL:    Level 0  SITE AFTER BAND REMOVAL:    Level 0  CIRCULATION SENSATION AND MOVEMENT:    Within Normal Limits   Yes.    COMMENTS:   Care instructions given to patient

## 2022-04-28 ENCOUNTER — Other Ambulatory Visit (HOSPITAL_COMMUNITY): Payer: Self-pay

## 2022-04-28 DIAGNOSIS — I10 Essential (primary) hypertension: Secondary | ICD-10-CM

## 2022-04-28 DIAGNOSIS — R7989 Other specified abnormal findings of blood chemistry: Secondary | ICD-10-CM | POA: Diagnosis not present

## 2022-04-28 DIAGNOSIS — J441 Chronic obstructive pulmonary disease with (acute) exacerbation: Secondary | ICD-10-CM | POA: Diagnosis not present

## 2022-04-28 DIAGNOSIS — I214 Non-ST elevation (NSTEMI) myocardial infarction: Secondary | ICD-10-CM

## 2022-04-28 DIAGNOSIS — I5021 Acute systolic (congestive) heart failure: Secondary | ICD-10-CM | POA: Diagnosis not present

## 2022-04-28 LAB — CBC
HCT: 31.5 % — ABNORMAL LOW (ref 39.0–52.0)
Hemoglobin: 10.1 g/dL — ABNORMAL LOW (ref 13.0–17.0)
MCH: 27.9 pg (ref 26.0–34.0)
MCHC: 32.1 g/dL (ref 30.0–36.0)
MCV: 87 fL (ref 80.0–100.0)
Platelets: 318 10*3/uL (ref 150–400)
RBC: 3.62 MIL/uL — ABNORMAL LOW (ref 4.22–5.81)
RDW: 15 % (ref 11.5–15.5)
WBC: 9.5 10*3/uL (ref 4.0–10.5)
nRBC: 0 % (ref 0.0–0.2)

## 2022-04-28 LAB — BASIC METABOLIC PANEL
Anion gap: 7 (ref 5–15)
BUN: 25 mg/dL — ABNORMAL HIGH (ref 8–23)
CO2: 24 mmol/L (ref 22–32)
Calcium: 8.5 mg/dL — ABNORMAL LOW (ref 8.9–10.3)
Chloride: 103 mmol/L (ref 98–111)
Creatinine, Ser: 0.91 mg/dL (ref 0.61–1.24)
GFR, Estimated: >60 mL/min (ref >60)
Glucose, Bld: 138 mg/dL — ABNORMAL HIGH (ref 70–99)
Potassium: 4.2 mmol/L (ref 3.5–5.1)
Sodium: 134 mmol/L — ABNORMAL LOW (ref 135–145)

## 2022-04-28 LAB — GLUCOSE, CAPILLARY
Glucose-Capillary: 115 mg/dL — ABNORMAL HIGH (ref 70–99)
Glucose-Capillary: 175 mg/dL — ABNORMAL HIGH (ref 70–99)

## 2022-04-28 MED ORDER — SACUBITRIL-VALSARTAN 24-26 MG PO TABS: 1.0000 | ORAL_TABLET | Freq: Two times a day (BID) | ORAL | Status: DC

## 2022-04-28 MED ORDER — SPIRONOLACTONE 25 MG PO TABS: 12.5000 mg | ORAL_TABLET | Freq: Every day | ORAL | 2 refills | Status: DC

## 2022-04-28 MED ORDER — LEVALBUTEROL TARTRATE 45 MCG/ACT IN AERO: 1.0000 | INHALATION_SPRAY | RESPIRATORY_TRACT | 2 refills | Status: DC | PRN

## 2022-04-28 MED ORDER — SACUBITRIL-VALSARTAN 24-26 MG PO TABS: 1.0000 | ORAL_TABLET | Freq: Two times a day (BID) | ORAL | 2 refills | Status: DC

## 2022-04-28 MED ORDER — GUAIFENESIN-DM 100-10 MG/5ML PO SYRP: 5.0000 mL | ORAL_SOLUTION | Freq: Four times a day (QID) | ORAL | 0 refills | Status: DC | PRN

## 2022-04-28 MED ORDER — METOPROLOL SUCCINATE ER 25 MG PO TB24: 25.0000 mg | ORAL_TABLET | Freq: Every day | ORAL | 2 refills | Status: DC

## 2022-04-28 MED ORDER — SPIRONOLACTONE 12.5 MG HALF TABLET
12.5000 mg | ORAL_TABLET | Freq: Every day | ORAL | Status: DC
  Administered 2022-04-28: 12.5 mg via ORAL
  Filled 2022-04-28: qty 1

## 2022-04-28 MED ORDER — OXYCODONE-ACETAMINOPHEN 5-325 MG PO TABS: 1.0000 | ORAL_TABLET | Freq: Four times a day (QID) | ORAL | 0 refills | Status: AC | PRN

## 2022-04-28 MED ORDER — OXYCODONE-ACETAMINOPHEN 5-325 MG PO TABS
1.0000 | ORAL_TABLET | Freq: Four times a day (QID) | ORAL | Status: DC | PRN
  Administered 2022-04-28: 1 via ORAL
  Filled 2022-04-28: qty 1

## 2022-04-28 MED FILL — Fentanyl Citrate Preservative Free (PF) Inj 100 MCG/2ML: INTRAMUSCULAR | Qty: 2 | Status: AC

## 2022-04-28 NOTE — Discharge Summary (Signed)
Physician Discharge Summary  Alan Hurst YQM:[id number] DOB: 05-29-1945 DOA: 04/22/2022  PCP: Lorrene Reid, PA-C  Admit date: 04/22/2022 Discharge date: 04/28/2022  Admitted From: Home Discharge disposition: Home  Recommendations at discharge:  Toprol 25 mg daily, Entresto 24/26 mg twice daily, Aldactone 12.5 mg daily.   follow-up with cardiology in outpatient.   Need repeat BMP in 1 week. Pain medicines as needed for hip pain   Brief narrative: Alan Hurst is a 76 y.o. male with PMH significant for DM2, HTN, HLD, COPD, history of lung cancer-treated 12/13, patient presented to the ED for shortness of breath, productive cough, intermittent midsternal chest pain for a week.  Also reports bilateral lower extremity swelling (Rt >> Lt).he has been using his albuterol metered-dose inhaler without any significant improvement in his symptoms. 12/12, he was seen at the urgent care clinic and was prescribed a steroid inhaler and Mucinex but did not feel relief.  In the ED, patient was afebrile, tachycardic to 130s, blood pressure in 150s, required 3 L oxygen manage cannula Initial labs with unremarkable CBC, potassium low at 3.1, renal function normal, BNP elevated to 925, troponin elevated 522, lactic acid elevated to 2 CT angio chest did not show evidence of pulm embolism but showed stable right apical consolidation consistent with treated lung cancer. EKG showed sinus tachycardia. Admitted to [facility].   Subjective: Patient was seen and examined this morning.   Hip pain improving.  Breathing better. Patient is able to ambulate on the hallway with O2 sat more than 90% on room air.  Assessment and plan: Acute exacerbation of COPD Known history of COPD, presented with progressive worsening respiratory symptoms. No evidence of pneumonia on chest imaging.  Did not require antibiotics. Clinically he had wheezing and hence he was started on tapering course of IV Solu-Medrol.  Currently on prednisone 40 mg daily.  Breathing better not requiring supplemental oxygen.  No wheezing.  Currently patient needs any more high-dose prednisone. PTA, he was on prednisone 5 mg daily which she will continue at home. Also continue beclomethasone inhalational twice daily, Stiolto twice daily as well as azithromycin 250 mg every other day, Mucinex. Albuterol was replaced with Xopenex because of tachycardia Recent Labs  Lab 04/22/22 1945 04/23/22 0700 04/24/22 0445 04/25/22 0500 04/26/22 0445 04/27/22 0503 04/28/22 0449  WBC 7.6   < > 15.4* 8.4 8.0 7.9 9.5  LATICACIDVEN 2.0*  --  1.1  --   --   --   --    < > = values in this interval not displayed.   Acute respiratory failure with hypoxia Initially required up to 4 L of oxygen.  Gradually weaned down.  Currently able to ambulate on room air.   Follows up with pulmonologist Dr. Josefina Do as at [facility]  NSTEMI (non-ST elevated myocardial infarction)  New drop in EF Patient complained of chest pain on admission.  Troponin was elevated.  Chest pain and troponin were initially suspected to be secondary to demand ischemia from tachycardia and acute COPD exacerbation Patient's cardiac risk factors include hypertension and diabetes mellitus On admission, patient was started on heparin drip. 12/15, patient had recurrence of chest pain.  Heparin drip was restarted.  Cardiology was consulted.  12/18, underwent cardiac cath this morning.  Nonobstructive CAD. Continue metoprolol, aspirin and statin.    Acute systolic CHF Essential hypertension  Echocardiogram showed a new drop in EF to 40 to 45% but could not comment on wall motion abnormality. Per cardiology recommendation, patient  will be discharged on Toprol 25 mg daily, Entresto 24/26 mg twice daily, Aldactone 12.5 mg daily.  To follow-up with cardiology in outpatient.  Need repeat BMP in 1 week.   Left hip pain 12/18, patient had excruciating pain on the left hip.  IV pain meds  were given.  X-ray did not show any evidence of fracture.  Pain likely related to osteoarthritis.  As needed pain meds at discharge  GERD PTA on Prilosec Continue the same  Controlled type 2 diabetes mellitus Hyperglycemia due to steroids A1c 6.4 on 11/20/2021 PTA on metformin.  Resume the same.  GERD (gastroesophageal reflux disease) Continue PPI  History of lung cancer Had radiation.  In remission.  Follows up with Dr. Baird Cancer at [place] of care   Code Status: Full Code   Wounds:  - Incision (Closed) 08/07/16 Hip Right (Active)  Date First Assessed/Time First Assessed: 08/07/16 1036   Location: Hip  Location Orientation: Right    Assessments 08/07/2016 12:38 PM 08/10/2016  9:00 AM  Dressing Type Hydrocolloid Silver hydrofiber  Dressing Clean;Dry;Intact Clean;Dry;Intact  Site / Wound Assessment -- Dressing in place / Unable to assess  Drainage Amount None --     No associated orders.    Discharge Exam:   Vitals:   04/28/22 0607 04/28/22 0923 04/28/22 1034 04/28/22 1148  BP: 132/82  138/70 124/69  Pulse: 67  67 70  Resp: 16  18 18   Temp: 97.8 F (36.6 C)  98 F (36.7 C) 98 F (36.7 C)  TempSrc: Oral  Oral Oral  SpO2: 98% 98% 99% 98%  Weight:      Height:        Body mass index is 27.74 kg/m.  General exam: Pleasant, elderly Caucasian male.  Not in physical distress Skin: No rashes, lesions or ulcers. HEENT: Atraumatic, normocephalic, no obvious bleeding Lungs: Clear to auscultation bilaterally.  Improving cough.  No wheezing. CVS: Regular rate and rhythm, no murmur GI/Abd soft, nontender, nondistended, bowel sound present CNS: Alert, awake, oriented x 3 Psychiatry: Mood appropriate Extremities: No pedal edema, no calf tenderness.  Improving pain on the left hip Follow ups:    Follow-up Information     Donato Heinz, MD Follow up on 05/20/2022.   Specialties: Cardiology, Radiology Why: at 10:05 with Dr Newman Nickels NP Denyse Amass. Contact  information: [address] [place] [address] [phone]         Lorrene Reid, PA-C Follow up.   Specialty: Physician Assistant Contact information: [place] [address] [phone]                 Discharge Instructions:   Discharge Instructions     Call MD for:  difficulty breathing, headache or visual disturbances   Complete by: As directed    Call MD for:  extreme fatigue   Complete by: As directed    Call MD for:  hives   Complete by: As directed    Call MD for:  persistant dizziness or light-headedness   Complete by: As directed    Call MD for:  persistant nausea and vomiting   Complete by: As directed    Call MD for:  severe uncontrolled pain   Complete by: As directed    Call MD for:  temperature >100.4   Complete by: As directed    Diet - low sodium heart healthy   Complete by: As directed    Diet Carb Modified  Complete by: As directed    Discharge instructions   Complete by: As directed    Recommendations at discharge:   Toprol 25 mg daily, Entresto 24/26 mg twice daily, Aldactone 12.5 mg daily.    follow-up with cardiology in outpatient.    Need repeat BMP in 1 week.  Pain medicines as needed for hip pain  Discharge instructions for diabetes mellitus: Check blood sugar 3 times a day and bedtime at home. If blood sugar running above 200 or less than 70 please call your MD to adjust insulin. If you notice signs and symptoms of hypoglycemia (low blood sugar) like jitteriness, confusion, thirst, tremor and sweating, please check blood sugar, drink sugary drink/biscuits/sweets to increase sugar level and call MD or return to ER.    Discharge instructions for CHF Check weight daily -preferably same time every day. Restrict fluid intake to 1200 ml daily Restrict salt intake to less than 2 g daily. Call MD if you have one of the following symptoms 1) 3 pound weight gain in 24 hours or 5  pounds in 1 week  2) swelling in the hands, feet or stomach  3) progressive shortness of breath 4) if you have to sleep on extra pillows at night in order to breathe     General discharge instructions: Follow with Primary MD Lorrene Reid, PA-C in 7 days  Please request your PCP  to go over your hospital tests, procedures, radiology results at the follow up. Please get your medicines reviewed and adjusted.  Your PCP may decide to repeat certain labs or tests as needed. Do not drive, operate heavy machinery, perform activities at heights, swimming or participation in water activities or provide baby sitting services if your were admitted for syncope or siezures until you have seen by Primary MD or a Neurologist and advised to do so again. [place] Controlled Substance Reporting System database was reviewed. Do not drive, operate heavy machinery, perform activities at heights, swim, participate in water activities or provide baby-sitting services while on medications for pain, sleep and mood until your outpatient physician has reevaluated you and advised to do so again.  You are strongly recommended to comply with the dose, frequency and duration of prescribed medications. Activity: As tolerated with Full fall precautions use walker/cane & assistance as needed Avoid using any recreational substances like cigarette, tobacco, alcohol, or non-prescribed drug. If you experience worsening of your admission symptoms, develop shortness of breath, life threatening emergency, suicidal or homicidal thoughts you must seek medical attention immediately by calling 911 or calling your MD immediately  if symptoms less severe. You must read complete instructions/literature along with all the possible adverse reactions/side effects for all the medicines you take and that have been prescribed to you. Take any new medicine only after you have completely understood and accepted all the possible adverse  reactions/side effects.  Wear Seat belts while driving. You were cared for by a hospitalist during your hospital stay. If you have any questions about your discharge medications or the care you received while you were in the hospital after you are discharged, you can call the unit and ask to speak with the hospitalist or the covering physician. Once you are discharged, your primary care physician will handle any further medical issues. Please note that NO REFILLS for any discharge medications will be authorized once you are discharged, as it is imperative that you return to your primary care physician (or establish a relationship with a primary care physician if  you do not have one).   Increase activity slowly   Complete by: As directed        Discharge Medications:   Allergies as of 04/28/2022       Reactions   No Known Allergies    Lemon Oil Rash        Medication List     STOP taking these medications    albuterol 108 (90 Base) MCG/ACT inhaler Commonly known as: VENTOLIN HFA   guaiFENesin 200 MG tablet   lisinopril 2.5 MG tablet Commonly known as: ZESTRIL   potassium chloride 10 MEQ tablet Commonly known as: KLOR-CON       TAKE these medications    abiraterone acetate 250 MG tablet Commonly known as: ZYTIGA Take 1,000 mg by mouth daily.   aspirin EC 81 MG tablet Take 81 mg by mouth daily.   azithromycin 250 MG tablet Commonly known as: ZITHROMAX Take 250 mg by mouth every other day.   ciclopirox 8 % solution Commonly known as: PENLAC Apply topically at bedtime. Apply over nail and surrounding skin. Apply daily over previous coat. After seven (7) days, may remove with alcohol and continue cycle.   Fish Oil 500 MG Caps Take 500 mg by mouth daily.   fluticasone 50 MCG/ACT nasal spray Commonly known as: FLONASE Place 1 spray into both nostrils daily. What changed:  when to take this reasons to take this   gabapentin 100 MG capsule Commonly known as:  NEURONTIN Take 300 mg by mouth at bedtime.   guaiFENesin-dextromethorphan 100-10 MG/5ML syrup Commonly known as: ROBITUSSIN DM Take 5 mLs by mouth every 6 (six) hours as needed for cough.   levalbuterol 45 MCG/ACT inhaler Commonly known as: XOPENEX HFA Inhale 1 puff into the lungs every 4 (four) hours as needed for wheezing.   lovastatin 40 MG tablet Commonly known as: MEVACOR TAKE 1 TABLET BY MOUTH AT  BEDTIME   magnesium oxide 400 MG tablet Commonly known as: MAG-OX Take 400 mg by mouth daily.   melatonin 5 MG Tabs Take 5 mg by mouth at bedtime.   metFORMIN 500 MG tablet Commonly known as: GLUCOPHAGE Take 1 tablet (500 mg total) by mouth daily.   metoprolol succinate 25 MG 24 hr tablet Commonly known as: TOPROL-XL Take 1 tablet (25 mg total) by mouth daily. Start taking on: April 29, 2022   omeprazole 20 MG capsule Commonly known as: PRILOSEC TAKE 1 CAPSULE BY MOUTH DAILY   oxyCODONE-acetaminophen 5-325 MG tablet Commonly known as: PERCOCET/ROXICET Take 1 tablet by mouth every 6 (six) hours as needed for up to 5 days for severe pain or moderate pain.   predniSONE 5 MG tablet Commonly known as: DELTASONE Take 5 mg by mouth daily.   Qvar RediHaler 40 MCG/ACT inhaler Generic drug: beclomethasone Inhale 1 puff into the lungs 2 (two) times daily.   sacubitril-valsartan 24-26 MG Commonly known as: ENTRESTO Take 1 tablet by mouth 2 (two) times daily. Start taking on: April 29, 2022   spironolactone 25 MG tablet Commonly known as: ALDACTONE Take 0.5 tablets (12.5 mg total) by mouth daily. Start taking on: April 29, 2022   Stiolto Respimat 2.5-2.5 MCG/ACT Aers Generic drug: Tiotropium Bromide-Olodaterol Inhale 2 each into the lungs daily.   vitamin B-12 500 MCG tablet Commonly known as: CYANOCOBALAMIN Take 500 mcg by mouth daily.   Vitamin D 50 MCG (2000 UT) tablet Take 1 tablet by mouth daily.         The results of significant  diagnostics  from this hospitalization (including imaging, microbiology, ancillary and laboratory) are listed below for reference.    Procedures and Diagnostic Studies:   ECHOCARDIOGRAM COMPLETE  Result Date: 04/23/2022    ECHOCARDIOGRAM REPORT   Patient Name:   Alan Hurst Date of Exam: 04/23/2022 Medical Rec #:  [id number]       Height:       69.0 in Accession #:    [id number]      Weight:       185.0 lb Date of Birth:  January 31, 1946       BSA:          1.999 m Patient Age:    39 years        BP:           140/95 mmHg Patient Gender: M               HR:           107 bpm. Exam Location:  Inpatient Procedure: 2D Echo and Intracardiac Opacification Agent Indications:    NSTEMI  History:        Patient has no prior history of Echocardiogram examinations.                 COPD; Risk Factors:Hypertension, Former Smoker and Diabetes.  Sonographer:    Harvie Junior Referring Phys: [id number] HENIDPOE AGBATA  Sonographer Comments: Technically difficult study due to poor echo windows, suboptimal parasternal window, suboptimal apical window and suboptimal subcostal window. Image acquisition challenging due to COPD and Image acquisition challenging due to respiratory motion. IMPRESSIONS  1. Left ventricular ejection fraction, by estimation, is 40 to 45%. The left ventricle has mildly decreased function. Left ventricular endocardial border not optimally defined to evaluate regional wall motion. Indeterminate diastolic filling due to E-A fusion.  2. Right ventricular systolic function is normal. The right ventricular size is normal. There is normal pulmonary artery systolic pressure.  3. The mitral valve is degenerative. Trivial mitral valve regurgitation. No evidence of mitral stenosis.  4. The aortic valve is normal in structure. Aortic valve regurgitation is not visualized. No aortic stenosis is present.  5. The inferior vena cava is normal in size with greater than 50% respiratory variability, suggesting right atrial pressure of 3  mmHg. FINDINGS  Left Ventricle: Left ventricular ejection fraction, by estimation, is 40 to 45%. The left ventricle has mildly decreased function. Left ventricular endocardial border not optimally defined to evaluate regional wall motion. Definity contrast agent was given IV to delineate the left ventricular endocardial borders. The left ventricular internal cavity size was normal in size. There is no left ventricular hypertrophy. Indeterminate diastolic filling due to E-A fusion. Right Ventricle: The right ventricular size is normal. No increase in right ventricular wall thickness. Right ventricular systolic function is normal. There is normal pulmonary artery systolic pressure. The tricuspid regurgitant velocity is 2.46 m/s, and  with an assumed right atrial pressure of 3 mmHg, the estimated right ventricular systolic pressure is 42.3 mmHg. Left Atrium: Left atrial size was normal in size. Right Atrium: Right atrial size was normal in size. Pericardium: There is no evidence of pericardial effusion. Mitral Valve: The mitral valve is degenerative in appearance. There is mild calcification of the anterior mitral valve leaflet(s). Mild to moderate mitral annular calcification. Trivial mitral valve regurgitation. No evidence of mitral valve stenosis. Tricuspid Valve: The tricuspid valve is normal in structure. Tricuspid valve regurgitation is trivial. No evidence of tricuspid stenosis. Aortic Valve: The aortic  valve is normal in structure. Aortic valve regurgitation is not visualized. No aortic stenosis is present. Aortic valve mean gradient measures 2.0 mmHg. Aortic valve peak gradient measures 3.4 mmHg. Aortic valve area, by VTI measures 3.67 cm. Pulmonic Valve: The pulmonic valve was normal in structure. Pulmonic valve regurgitation is not visualized. No evidence of pulmonic stenosis. Aorta: The aortic root is normal in size and structure. Venous: The inferior vena cava is normal in size with greater than 50%  respiratory variability, suggesting right atrial pressure of 3 mmHg. IAS/Shunts: No atrial level shunt detected by color flow Doppler.  LEFT VENTRICLE PLAX 2D LVIDd:         4.60 cm      Diastology LVIDs:         4.10 cm      LV e' medial:    4.57 cm/s LV PW:         1.00 cm      LV E/e' medial:  27.8 LV IVS:        0.90 cm      LV e' lateral:   18.50 cm/s LVOT diam:     2.30 cm      LV E/e' lateral: 6.9 LV SV:         66 LV SV Index:   33 LVOT Area:     4.15 cm  LV Volumes (MOD) LV vol d, MOD A2C: 113.0 ml LV vol d, MOD A4C: 89.2 ml LV vol s, MOD A2C: 66.7 ml LV vol s, MOD A4C: 52.0 ml LV SV MOD A2C:     46.3 ml LV SV MOD A4C:     89.2 ml LV SV MOD BP:      42.8 ml RIGHT VENTRICLE RV Basal diam:  3.80 cm RV Mid diam:    3.40 cm TAPSE (M-mode): 2.2 cm LEFT ATRIUM             Index        RIGHT ATRIUM           Index LA diam:        3.60 cm 1.80 cm/m   RA Area:     13.50 cm LA Vol (A2C):   63.3 ml 31.67 ml/m  RA Volume:   30.00 ml  15.01 ml/m LA Vol (A4C):   36.9 ml 18.46 ml/m LA Biplane Vol: 49.9 ml 24.97 ml/m  AORTIC VALVE AV Area (Vmax):    3.82 cm AV Area (Vmean):   3.20 cm AV Area (VTI):     3.67 cm AV Vmax:           92.70 cm/s AV Vmean:          73.100 cm/s AV VTI:            0.179 m AV Peak Grad:      3.4 mmHg AV Mean Grad:      2.0 mmHg LVOT Vmax:         85.30 cm/s LVOT Vmean:        56.300 cm/s LVOT VTI:          0.158 m LVOT/AV VTI ratio: 0.88  AORTA Ao Root diam: 3.40 cm MITRAL VALVE                TRICUSPID VALVE MV Area (PHT): 5.02 cm     TR Peak grad:   24.2 mmHg MV Decel Time: 151 msec     TR Vmax:        246.00  cm/s MR Peak grad: 21.8 mmHg MR Vmax:      233.67 cm/s   SHUNTS MV E velocity: 127.00 cm/s  Systemic VTI:  0.16 m MV A velocity: 33.50 cm/s   Systemic Diam: 2.30 cm MV E/A ratio:  3.79 Fransico Him MD Electronically signed by Fransico Him MD Signature Date/Time: 04/23/2022/1:22:41 PM    Final    CT Angio Chest Pulmonary Embolism (PE) W or WO Contrast  Result Date:  04/22/2022 CLINICAL DATA:  Short of breath, cough, history of right lung cancer status post radiation therapy EXAM: CT ANGIOGRAPHY CHEST WITH CONTRAST TECHNIQUE: Multidetector CT imaging of the chest was performed using the standard protocol during bolus administration of intravenous contrast. Multiplanar CT image reconstructions and MIPs were obtained to evaluate the vascular anatomy. RADIATION DOSE REDUCTION: This exam was performed according to the departmental dose-optimization program which includes automated exposure control, adjustment of the mA and/or kV according to patient size and/or use of iterative reconstruction technique. CONTRAST:  20mL OMNIPAQUE IOHEXOL 350 MG/ML SOLN COMPARISON:  04/22/2022, 06/10/2021, 02/06/2022 FINDINGS: Cardiovascular: This is a technically adequate evaluation of the pulmonary vasculature. No filling defects or pulmonary emboli. The heart is unremarkable without pericardial effusion. Coronary artery atherosclerosis most pronounced within the LAD distribution. No evidence of thoracic aortic aneurysm or dissection. Atherosclerosis throughout the aorta. Mediastinum/Nodes: No enlarged mediastinal, hilar, or axillary lymph nodes. Thyroid gland, trachea, and esophagus demonstrate no significant findings. Lungs/Pleura: Small right pleural effusion volume estimated less than 500 cc. Chronic consolidation at the right apex, measuring 5.5 x 2.1 cm, not significantly changed since prior PET scan, consistent with treated right upper lobe lung cancer. Stable background emphysema. Chronic scarring or atelectasis left upper lobe. No acute airspace disease. No pneumothorax. The central airways are patent. Upper Abdomen: No acute abnormality. Musculoskeletal: No acute or destructive bony lesions. Reconstructed images demonstrate no additional findings. Review of the MIP images confirms the above findings. IMPRESSION: 1. No evidence of pulmonary embolus. 2. Small right pleural effusion, volume  estimated less than 500 cc. 3. Stable right apical consolidation consistent with treated lung cancer. Continued attention on follow-up. 4. Aortic Atherosclerosis (ICD10-I70.0) and Emphysema (ICD10-J43.9). Electronically Signed   By: Randa Ngo M.D.   On: 04/22/2022 23:22   DG Chest Portable 1 View  Result Date: 04/22/2022 CLINICAL DATA:  Shortness of breath and cough. History of COPD. Severe right lung cancer status post radiation therapy. EXAM: PORTABLE CHEST 1 VIEW COMPARISON:  Chest two views 04/21/2022, 03/04/2021, 11/06/2020; PET-CT 02/06/2022, CT chest 06/10/2021 FINDINGS: There is again postradiation scarring within the superomedial right lung. Right costophrenic angle chronic pleural thickening/scar is unchanged. No acute airspace opacity is seen. No pneumothorax. Cardiac silhouette and mediastinal contours are within normal limits. No acute skeletal abnormality. IMPRESSION: Postradiation scarring in the superomedial right lung, unchanged. No acute lung process. No significant change from prior. Electronically Signed   By: Yvonne Kendall M.D.   On: 04/22/2022 18:40     Labs:   Basic Metabolic Panel: Recent Labs  Lab 04/22/22 2258 04/23/22 0700 04/24/22 0445 04/26/22 0445 04/27/22 0503 04/28/22 0449  NA  --  137 139 136 135 134*  K  --  4.0 4.5 4.0 4.0 4.2  CL  --  104 107 106 105 103  CO2  --  23 23 23 25 24   GLUCOSE  --  161* 151* 133* 142* 138*  BUN  --  14 34* 36* 33* 25*  CREATININE  --  1.01 1.02 1.05 1.03 0.91  CALCIUM  --  9.2 9.0 8.4* 8.7* 8.5*  MG 2.3  --   --   --   --   --    GFR Estimated Creatinine Clearance: 74.7 mL/min (by C-G formula based on SCr of 0.91 mg/dL). Liver Function Tests: Recent Labs  Lab 04/22/22 1945  AST 31  ALT 19  ALKPHOS 60  BILITOT 0.8  PROT 6.7  ALBUMIN 3.1*   No results for input(s): "LIPASE", "AMYLASE" in the last 168 hours. No results for input(s): "AMMONIA" in the last 168 hours. Coagulation profile Recent Labs  Lab  04/22/22 2258  INR 1.1    CBC: Recent Labs  Lab 04/22/22 1945 04/23/22 0700 04/24/22 0445 04/25/22 0500 04/26/22 0445 04/27/22 0503 04/28/22 0449  WBC 7.6   < > 15.4* 8.4 8.0 7.9 9.5  NEUTROABS 6.7  --  13.3*  --   --   --   --   HGB 11.6*   < > 11.2* 10.2* 10.0* 10.7* 10.1*  HCT 36.5*   < > 34.6* 32.2* 31.7* 32.5* 31.5*  MCV 86.1   < > 85.0 86.8 87.6 84.2 87.0  PLT 230   < > 291 251 259 269 318   < > = values in this interval not displayed.   Cardiac Enzymes: No results for input(s): "CKTOTAL", "CKMB", "CKMBINDEX", "TROPONINI" in the last 168 hours. BNP: Invalid input(s): "POCBNP" CBG: Recent Labs  Lab 04/27/22 0928 04/27/22 1620 04/27/22 2127 04/28/22 0727 04/28/22 1146  GLUCAP 118* 168* 196* 115* 175*   D-Dimer No results for input(s): "DDIMER" in the last 72 hours. Hgb A1c No results for input(s): "HGBA1C" in the last 72 hours. Lipid Profile No results for input(s): "CHOL", "HDL", "LDLCALC", "TRIG", "CHOLHDL", "LDLDIRECT" in the last 72 hours. Thyroid function studies No results for input(s): "TSH", "T4TOTAL", "T3FREE", "THYROIDAB" in the last 72 hours.  Invalid input(s): "FREET3" Anemia work up No results for input(s): "VITAMINB12", "FOLATE", "FERRITIN", "TIBC", "IRON", "RETICCTPCT" in the last 72 hours. Microbiology Recent Results (from the past 240 hour(s))  Blood culture (routine x 2)     Status: None   Collection Time: 04/22/22  7:45 PM   Specimen: BLOOD LEFT HAND  Result Value Ref Range Status   Specimen Description   Final    BLOOD LEFT HAND Performed at [facility], [place] [address], [place], [place] [address]    Special Requests   Final    BOTTLES DRAWN AEROBIC AND ANAEROBIC Blood Culture adequate volume Performed at [facility] [address], [place], [place] [address]    Culture   Final    NO GROWTH 5 DAYS Performed at [facility], [place] [address], [place], [place] [address]    Report Status 04/27/2022  FINAL  Final  Resp panel by RT-PCR (RSV, Flu A&B, Covid)     Status: None   Collection Time: 04/22/22  7:45 PM   Specimen: Nasal Swab  Result Value Ref Range Status   SARS Coronavirus 2 by RT PCR NEGATIVE NEGATIVE Final    Comment: (NOTE) SARS-CoV-2 target nucleic acids are NOT DETECTED.  The SARS-CoV-2 RNA is generally detectable in upper respiratory specimens during the acute phase of infection. The lowest concentration of SARS-CoV-2 viral copies this assay can detect is 138 copies/mL. A negative result does not preclude SARS-Cov-2 infection and should not be used as the sole basis for treatment or other patient management decisions. A negative result may occur with  improper specimen collection/handling, submission  of specimen other than nasopharyngeal swab, presence of viral mutation(s) within the areas targeted by this assay, and inadequate number of viral copies(<138 copies/mL). A negative result must be combined with clinical observations, patient history, and epidemiological information. The expected result is Negative.  Fact Sheet for Patients:  EntrepreneurPulse.com.au  Fact Sheet for Healthcare Providers:  IncredibleEmployment.be  This test is no t yet approved or cleared by the [place] FDA and  has been authorized for detection and/or diagnosis of SARS-CoV-2 by FDA under an Emergency Use Authorization (EUA). This EUA will remain  in effect (meaning this test can be used) for the duration of the COVID-19 declaration under Section 564(b)(1) of the Act, 21 U.S.C.section 360bbb-3(b)(1), unless the authorization is terminated  or revoked sooner.       Influenza A by PCR NEGATIVE NEGATIVE Final   Influenza B by PCR NEGATIVE NEGATIVE Final    Comment: (NOTE) The Xpert Xpress SARS-CoV-2/FLU/RSV plus assay is intended as an aid in the diagnosis of influenza from Nasopharyngeal swab specimens and should not be used as a sole basis  for treatment. Nasal washings and aspirates are unacceptable for Xpert Xpress SARS-CoV-2/FLU/RSV testing.  Fact Sheet for Patients: EntrepreneurPulse.com.au  Fact Sheet for Healthcare Providers: IncredibleEmployment.be  This test is not yet approved or cleared by the [place] FDA and has been authorized for detection and/or diagnosis of SARS-CoV-2 by FDA under an Emergency Use Authorization (EUA). This EUA will remain in effect (meaning this test can be used) for the duration of the COVID-19 declaration under Section 564(b)(1) of the Act, 21 U.S.C. section 360bbb-3(b)(1), unless the authorization is terminated or revoked.     Resp Syncytial Virus by PCR NEGATIVE NEGATIVE Final    Comment: (NOTE) Fact Sheet for Patients: EntrepreneurPulse.com.au  Fact Sheet for Healthcare Providers: IncredibleEmployment.be  This test is not yet approved or cleared by the [place] FDA and has been authorized for detection and/or diagnosis of SARS-CoV-2 by FDA under an Emergency Use Authorization (EUA). This EUA will remain in effect (meaning this test can be used) for the duration of the COVID-19 declaration under Section 564(b)(1) of the Act, 21 U.S.C. section 360bbb-3(b)(1), unless the authorization is terminated or revoked.  Performed at [facility], [place] [address], [place], [place] [address]   Blood culture (routine x 2)     Status: None   Collection Time: 04/22/22  7:59 PM   Specimen: BLOOD RIGHT FOREARM  Result Value Ref Range Status   Specimen Description   Final    BLOOD RIGHT FOREARM Performed at [facility], [place] [address] [place], [place] [address]    Special Requests   Final    BOTTLES DRAWN AEROBIC AND ANAEROBIC Blood Culture results may not be optimal due to an excessive volume of blood received in culture bottles Performed at [facility]  [address] [place], [place] [address]    Culture   Final    NO GROWTH 5 DAYS Performed at [facility], [place] [address] [place], [place] [address]    Report Status 04/27/2022 FINAL  Final    Time coordinating discharge: 35 minutes  Signed: Lian Pounds  [facility] 04/28/2022, 1:42 PM

## 2022-04-28 NOTE — Progress Notes (Signed)
.   Transition of Care [facility]) Screening Note   Patient Details  Name: Alan Hurst Date of Birth: 11-01-1945   Transition of Care [facility]) CM/SW Contact:    Illene Regulus, LCSW Phone Number: 04/28/2022, 2:02 PM    Transition of Care Department [facility]) has reviewed patient and no TOC needs have been identified at this time. We will continue to monitor patient advancement through interdisciplinary progression rounds. If new patient transition needs arise, please place a TOC consult.

## 2022-04-28 NOTE — Progress Notes (Signed)
SATURATION QUALIFICATIONS: (This note is used to comply with regulatory documentation for home oxygen)  Patient Saturations on Room Air at Rest = 98%  Patient Saturations on Room Air while Ambulating = 98% dropped to 91% at the end of the walk.  Please briefly explain why patient needs home oxygen: He does not seem to need home oxygen.

## 2022-04-28 NOTE — Progress Notes (Addendum)
Rounding Note    Patient Name: Alan Hurst Date of Encounter: 04/28/2022  [facility] Cardiologist: Donato Heinz, MD   Subjective   No complaints except cough while better is still present  Inpatient Medications    Scheduled Meds:  abiraterone acetate  1,000 mg Oral QAC breakfast   arformoterol  15 mcg Nebulization BID   And   umeclidinium bromide  1 puff Inhalation Daily   aspirin EC  81 mg Oral Daily   azithromycin  250 mg Oral QODAY   benzonatate  100 mg Oral TID   budesonide (PULMICORT) nebulizer solution  0.25 mg Nebulization BID   cholecalciferol  2,000 Units Oral Daily   enoxaparin (LOVENOX) injection  40 mg Subcutaneous Q24H   gabapentin  300 mg Oral QHS   insulin aspart  0-15 Units Subcutaneous TID WC   losartan  25 mg Oral Daily   magnesium oxide  400 mg Oral Daily   melatonin  10 mg Oral QHS   metoprolol succinate  25 mg Oral Daily   omega-3 acid ethyl esters  1 g Oral Daily   pantoprazole  40 mg Oral Daily   pravastatin  40 mg Oral q1800   predniSONE  40 mg Oral Q breakfast   sodium chloride flush  3 mL Intravenous Q12H   sodium chloride flush  3 mL Intravenous Q12H   sodium chloride flush  3 mL Intravenous Q12H   Continuous Infusions:  sodium chloride     sodium chloride     PRN Meds: sodium chloride, sodium chloride, acetaminophen **OR** acetaminophen, alum & mag hydroxide-simeth, guaiFENesin-dextromethorphan, levalbuterol, morphine injection, ondansetron **OR** ondansetron (ZOFRAN) IV, sodium chloride flush, sodium chloride flush   Vital Signs    Vitals:   04/27/22 1809 04/27/22 2130 04/28/22 0207 04/28/22 0607  BP: (!) 140/73 124/68 113/69 132/82  Pulse: 91 73 69 67  Resp: 16 16 (!) 21 16  Temp: 97.8 F (36.6 C) 98.4 F (36.9 C) 98 F (36.7 C) 97.8 F (36.6 C)  TempSrc: Oral Oral Oral Oral  SpO2: 99% 100% 95% 98%  Weight:      Height:        Intake/Output Summary (Last 24 hours) at 04/28/2022 0852 Last data  filed at 04/28/2022 0851 Gross per 24 hour  Intake 538.26 ml  Output 1800 ml  Net -1261.74 ml      04/26/2022    8:16 PM 04/22/2022   11:26 PM 02/24/2022    3:22 PM  Last 3 Weights  Weight (lbs) 187 lb 13.3 oz 185 lb 180 lb  Weight (kg) 85.2 kg 83.915 kg 81.647 kg      Telemetry    SR - Personally Reviewed  ECG    No new - Personally Reviewed  Physical Exam   GEN: No acute distress.   Neck: No JVD Cardiac: RRR, no murmurs, rubs, or gallops. Rt wrist cath site without hematoma Respiratory: + rhonchi and wheezes to auscultation bilaterally. GI: Soft, nontender, non-distended  MS: No edema; No deformity. Neuro:  Nonfocal  Psych: Normal affect   Labs    High Sensitivity Troponin:   Recent Labs  Lab 04/22/22 1945 04/22/22 2258 04/23/22 0700 04/24/22 0445 04/24/22 1142  TROPONINIHS 522* 506* 300* 179* 136*     Chemistry Recent Labs  Lab 04/22/22 1945 04/22/22 2258 04/23/22 0700 04/26/22 0445 04/27/22 0503 04/28/22 0449  NA 137  --    < > 136 135 134*  K 3.1*  --    < >  4.0 4.0 4.2  CL 104  --    < > 106 105 103  CO2 21*  --    < > 23 25 24   GLUCOSE 168*  --    < > 133* 142* 138*  BUN 10  --    < > 36* 33* 25*  CREATININE 0.90  --    < > 1.05 1.03 0.91  CALCIUM 9.3  --    < > 8.4* 8.7* 8.5*  MG  --  2.3  --   --   --   --   PROT 6.7  --   --   --   --   --   ALBUMIN 3.1*  --   --   --   --   --   AST 31  --   --   --   --   --   ALT 19  --   --   --   --   --   ALKPHOS 60  --   --   --   --   --   BILITOT 0.8  --   --   --   --   --   GFRNONAA >60  --    < > >60 >60 >60  ANIONGAP 12  --    < > 7 5 7    < > = values in this interval not displayed.    Lipids  Recent Labs  Lab 04/25/22 0500  CHOL 137  TRIG 71  HDL 60  LDLCALC 63  CHOLHDL 2.3    Hematology Recent Labs  Lab 04/26/22 0445 04/27/22 0503 04/28/22 0449  WBC 8.0 7.9 9.5  RBC 3.62* 3.86* 3.62*  HGB 10.0* 10.7* 10.1*  HCT 31.7* 32.5* 31.5*  MCV 87.6 84.2 87.0  MCH 27.6 27.7  27.9  MCHC 31.5 32.9 32.1  RDW 14.9 14.8 15.0  PLT 259 269 318   Thyroid  Recent Labs  Lab 04/25/22 0500  TSH 0.157*  FREET4 1.05    BNP Recent Labs  Lab 04/22/22 1945  BNP 925.5*    DDimer  Recent Labs  Lab 04/22/22 2258  DDIMER 0.86*     Radiology    DG HIP UNILAT WITH PELVIS 1V LEFT  Result Date: 04/27/2022 CLINICAL DATA:  Pain EXAM: DG HIP (WITH OR WITHOUT PELVIS) 1V*L* COMPARISON:  None Available. FINDINGS: Prior left hip arthroplasty. Normal alignment without evidence of loosening or periprosthetic fracture. Partially visualized right hip arthroplasty on AP view of the pelvis appears unremarkable. There are degenerative changes of the sacroiliac joints in the lower lumbar spine. IMPRESSION: No evidence of left hip arthroplasty complication. Multilevel degenerative changes in the lower lumbar spine. Electronically Signed   By: Maurine Simmering M.D.   On: 04/27/2022 16:42   CARDIAC CATHETERIZATION  Result Date: 04/27/2022   Prox RCA lesion is 30% stenosed.   Dist RCA lesion is 20% stenosed.   RPAV lesion is 40% stenosed.   Ost Cx to Prox Cx lesion is 40% stenosed.   Ost LAD to Prox LAD lesion is 30% stenosed. 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 Consider Entresto before discharge.    Cardiac Studies   Cardiac cath 04/27/22    Prox RCA lesion is 30% stenosed.   Dist RCA lesion is 20% stenosed.   RPAV lesion is 40% stenosed.   Ost Cx to Prox Cx lesion is 40% stenosed.   Ost LAD to Prox  LAD lesion is 30% stenosed.   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 Consider Entresto before discharge.   Diagnostic Dominance: Right  Intervention    Echo 04/23/22 IMPRESSIONS     1. Left ventricular ejection fraction, by estimation, is 40 to 45%. The  left ventricle has mildly decreased function. Left ventricular  endocardial  border not optimally defined to evaluate regional wall motion.  Indeterminate diastolic filling due to E-A  fusion.   2. Right ventricular systolic function is normal. The right ventricular  size is normal. There is normal pulmonary artery systolic pressure.   3. The mitral valve is degenerative. Trivial mitral valve regurgitation.  No evidence of mitral stenosis.   4. The aortic valve is normal in structure. Aortic valve regurgitation is  not visualized. No aortic stenosis is present.   5. The inferior vena cava is normal in size with greater than 50%  respiratory variability, suggesting right atrial pressure of 3 mmHg.   FINDINGS   Left Ventricle: Left ventricular ejection fraction, by estimation, is 40  to 45%. The left ventricle has mildly decreased function. Left ventricular  endocardial border not optimally defined to evaluate regional wall motion.  Definity contrast agent was  given IV to delineate the left ventricular endocardial borders. The left  ventricular internal cavity size was normal in size. There is no left  ventricular hypertrophy. Indeterminate diastolic filling due to E-A  fusion.   Right Ventricle: The right ventricular size is normal. No increase in  right ventricular wall thickness. Right ventricular systolic function is  normal. There is normal pulmonary artery systolic pressure. The tricuspid  regurgitant velocity is 2.46 m/s, and   with an assumed right atrial pressure of 3 mmHg, the estimated right  ventricular systolic pressure is 66.4 mmHg.   Left Atrium: Left atrial size was normal in size.   Right Atrium: Right atrial size was normal in size.   Pericardium: There is no evidence of pericardial effusion.   Mitral Valve: The mitral valve is degenerative in appearance. There is  mild calcification of the anterior mitral valve leaflet(s). Mild to  moderate mitral annular calcification. Trivial mitral valve regurgitation.  No evidence of  mitral valve stenosis.   Tricuspid Valve: The tricuspid valve is normal in structure. Tricuspid  valve regurgitation is trivial. No evidence of tricuspid stenosis.   Aortic Valve: The aortic valve is normal in structure. Aortic valve  regurgitation is not visualized. No aortic stenosis is present. Aortic  valve mean gradient measures 2.0 mmHg. Aortic valve peak gradient measures  3.4 mmHg. Aortic valve area, by VTI  measures 3.67 cm.   Pulmonic Valve: The pulmonic valve was normal in structure. Pulmonic valve  regurgitation is not visualized. No evidence of pulmonic stenosis.   Aorta: The aortic root is normal in size and structure.   Venous: The inferior vena cava is normal in size with greater than 50%  respiratory variability, suggesting right atrial pressure of 3 mmHg.   IAS/Shunts: No atrial level shunt detected by color flow Doppler.   Patient Profile     75 y.o. 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  EKG unremarkable.  Echo shows new systolic dysfunction (EF 29-52%).  with concern for ischemia cardiac cath was done with non obstructive CAD-- with results of cardiac cath troponin elevation represents demand ischemia in setting of his COPD exacerbation,-Continue ASA,  statin  heparin stopped    CAD non obstructive  --goal LDL <84  Acute systolic heart failure: Echo shows new systolic dysfunction (EF 13-24%) -On metoprolol, will consolidate to toprol XL  -Switched from lisinopril to losartan, likely transition to entresto  after cath  (last dose of lisinopril 04/24/22)  --Cr 0.91 BUN 25 down from 33 --add spironolactone now or as outpt. Defer to Dr. Johney Frame   Acute respiratory failure with hypoxia: Improving. Due to COPD exacerbation, hx lung cancer/DM-2/GERD treatment per primary team  still with rhonchi and wheezes    HLD: on pravastatin 40 mg daily.  LDL 63       For questions or updates, please contact [place] Please consult [email or web address] for contact info under        Signed, Cecilie Kicks, NP  04/28/2022, 8:52 AM    Patient seen and examined and agree with Cecilie Kicks, NP as detailed above.  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  Patient presented with worsening SOB and chest pain in the setting of acute COPD exacerbation. Trop peaked at 522. Cath on 04/27/22 with mild nonobstructive disease. TTE with LVEF 40-45%, normal RV, no significant valve disease consistent with nonischemic cardiomyopathy. Will plan to continue optimization of GDMT with changing losartan to entresto and adding spironolactone. Can add farxiga/jardiance as outpatient as tolerated.  GEN: No acute distress.   Neck: No JVD Cardiac: RRR, no murmurs, rubs, or gallops.  Respiratory: Diminished but clear GI: Soft, nontender, non-distended  MS: No edema; No deformity. Neuro:  Nonfocal  Psych: Normal affect    Plan: -Cath without obstructive disease  -Change losartan to entresto 24/26mg  BID -Start low dose spironolactone 12.5mg  daily -Continue metop 25mg  XL  daily -Continue pravastatin and ASA 81mg  dialy -Can add SGLT2i as outpatient -Will need repeat BMET in 1 week following discharge  Gwyndolyn Kaufman, MD

## 2022-04-28 NOTE — TOC Benefit Eligibility Note (Signed)
Patient Teacher, English as a foreign language completed.    The patient is currently admitted and upon discharge could be taking Entresto 24-26 mg.  The current 30 day co-pay is $178.60 due to being in Coverage Gap (donut hole).   The patient is insured through [place] Patient Advocate Specialist [place] Patient Advocate Team Direct Number: [phone]  Fax: [phone]

## 2022-04-28 NOTE — Discharge Instructions (Signed)
You will have appt with our heart failure follow up clinic as well to make sure you are doing well post hospital.

## 2022-04-29 ENCOUNTER — Telehealth: Payer: Self-pay

## 2022-04-29 NOTE — Patient Outreach (Signed)
  Care Coordination TOC Note Transition Care Management Follow-up Telephone Call Date of discharge and from where: [facility] 04/22/22-04/28/22 How have you been since you were released from the hospital? "I am feeling pretty good, my hip has been hurting" Any questions or concerns? Yes- Patient forgot his discharge instructions and his coupon for a 30 day supply of Entresto.  This Probation officer called inpatient pharmacy and was able to get a copy of the coupon.  Patient provided his email; [email or web address].  Discussed discharge instructions and securely sent to patients email.  Items Reviewed: Did the pt receive and understand the discharge instructions provided? Yes  (reviewed with patient) Medications obtained and verified?  Entresto to be obtained this afternoon Other? No  Any new allergies since your discharge? No  Dietary orders reviewed? No Do you have support at home? Yes   Home Care and Equipment/Supplies: Were home health services ordered? no If so, what is the name of the agency? N/a  Has the agency set up a time to come to the patient's home? not applicable Were any new equipment or medical supplies ordered?  No What is the name of the medical supply agency? N/a Were you able to get the supplies/equipment? no Do you have any questions related to the use of the equipment or supplies? No  Functional Questionnaire: (I = Independent and D = Dependent) ADLs: I  Bathing/Dressing- I  Meal Prep- I  Eating- I  Maintaining continence- I  Transferring/Ambulation- I  Managing Meds- I  Follow up appointments reviewed:  PCP Hospital f/u appt confirmed? No   Specialist Hospital f/u appt confirmed? Yes  Scheduled to see Coletta Memos, NP on 05/20/22 @ 10:05. Are transportation arrangements needed? No  If their condition worsens, is the pt aware to call PCP or go to the Emergency Dept.? Yes Was the patient provided with contact information for the PCP's office or ED? Yes Was to  pt encouraged to call back with questions or concerns? Yes  SDOH assessments and interventions completed:   Yes SDOH Interventions Today    Flowsheet Row Most Recent Value  SDOH Interventions   Housing Interventions Intervention Not Indicated  Transportation Interventions Intervention Not Indicated       Care Coordination Interventions:  PCP follow up appointment requested   Encounter Outcome:  Pt. Visit Completed

## 2022-05-06 ENCOUNTER — Other Ambulatory Visit: Payer: Self-pay

## 2022-05-06 DIAGNOSIS — E119 Type 2 diabetes mellitus without complications: Secondary | ICD-10-CM

## 2022-05-06 MED ORDER — METFORMIN HCL 500 MG PO TABS: 500.0000 mg | ORAL_TABLET | Freq: Every day | ORAL | 0 refills | Status: DC

## 2022-05-18 NOTE — Progress Notes (Deleted)
Cardiology Clinic Note   Patient Name: Alan Hurst Date of Encounter: 05/18/2022  Primary Care Provider:  Mayer Masker, PA-C Primary Cardiologist:  Little Ishikawa, MD  Patient Profile    Alan Hurst is a 77 y.o. 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   [facility]   Sleep disorder 12/05/2015   Vitamin B deficiency 12/05/2015   Vitamin D insufficiency 12/05/2015   Past Surgical History:  Procedure Laterality Date   CATARACT EXTRACTION, BILATERAL  10/2016   COLONOSCOPY     FEMUR FRACTURE SURGERY     HIP FRACTURE SURGERY     JOINT REPLACEMENT Bilateral    hips   LEFT HEART CATH AND CORONARY ANGIOGRAPHY N/A 04/27/2022   Procedure: LEFT HEART CATH AND CORONARY ANGIOGRAPHY;  Surgeon: Kathleene Hazel, MD;  Location: [facility];  Service: Cardiovascular;  Laterality: N/A;   TIBIA FRACTURE SURGERY     TOTAL HIP REVISION Right 08/07/2016   Procedure: RIGHT TOTAL HIP REVISION;  Surgeon: Samson Frederic, MD;  Location: [facility] OR;  Service: Orthopedics;  Laterality: Right;    Allergies  Allergies  Allergen Reactions   No Known Allergies    Lemon Oil Rash    History of Present Illness    Alan Hurst has a past medical history of: ***  Home Medications    No outpatient medications have been marked as taking for the  05/20/22 encounter (Appointment) with Ronney Asters, NP.    Family History    Family History  Problem Relation Age of Onset   Alcohol abuse Mother    Alcohol abuse Father    Congestive Heart Failure Father    Kidney disease Father    Colon cancer Neg Hx    Esophageal cancer Neg Hx    Rectal cancer Neg Hx    Stomach cancer Neg Hx    He indicated that his mother is deceased. He indicated that his father is deceased. He indicated that the status of his neg hx is unknown.   Social History    Social History   Socioeconomic History   Marital status: Married    Spouse name: [name]   Number of children: 0   Years of education: Not on file   Highest education level: Not on file  Occupational History   Occupation: semi retired  Tobacco Use   Smoking status: Former    Packs/day: 1.50    Years: 32.00    Total pack years: 48.00    Types: Cigarettes    Start date: 05/11/1961    Quit date: 05/11/1993    Years since quitting: 29.0   Smokeless tobacco: Never  Vaping Use   Vaping Use: Never used  Substance and Sexual Activity  Alcohol use: Not Currently   Drug use: No   Sexual activity: Not Currently    Birth control/protection: None  Other Topics Concern   Not on file  Social History Narrative   Not on file   Social Determinants of Health   Financial Resource Strain: Medium Risk (11/20/2021)   Overall Financial Resource Strain (CARDIA)    Difficulty of Paying Living Expenses: Somewhat hard  Food Insecurity: No Food Insecurity (04/23/2022)   Hunger Vital Sign    Worried About Running Out of Food in the Last Year: Never true    Ran Out of Food in the Last Year: Never true  Transportation Needs: No Transportation Needs (04/29/2022)   PRAPARE - Administrator, Civil Service (Medical): No    Lack of Transportation (Non-Medical): No  Physical Activity: Insufficiently Active (11/20/2021)   Exercise Vital Sign    Days of Exercise per Week: 2 days    Minutes of  Exercise per Session: 20 min  Stress: Stress Concern Present (11/20/2021)   [employer] of Occupational Health - Occupational Stress Questionnaire    Feeling of Stress : To some extent  Social Connections: Moderately Integrated (11/20/2021)   Social Connection and Isolation Panel [NHANES]    Frequency of Communication with Friends and Family: More than three times a week    Frequency of Social Gatherings with Friends and Family: Twice a week    Attends Religious Services: Never    Database administrator or Organizations: Yes    Attends Engineer, structural: More than 4 times per year    Marital Status: Married  Catering manager Violence: Not At Risk (04/23/2022)   Humiliation, Afraid, Rape, and Kick questionnaire    Fear of Current or Ex-Partner: No    Emotionally Abused: No    Physically Abused: No    Sexually Abused: No     Review of Systems    General: *** No chills, fever, night sweats or weight changes.  Cardiovascular:  No chest pain, dyspnea on exertion, edema, orthopnea, palpitations, paroxysmal nocturnal dyspnea. Dermatological: No rash, lesions/masses Respiratory: No cough, dyspnea Urologic: No hematuria, dysuria Abdominal:   No nausea, vomiting, diarrhea, bright red blood per rectum, melena, or hematemesis Neurologic:  No visual changes, weakness, changes in mental status. All other systems reviewed and are otherwise negative except as noted above.  Physical Exam    VS:  There were no vitals taken for this visit. , BMI There is no height or weight on file to calculate BMI. GEN: *** Well nourished, well developed, in no acute distress. HEENT: Normal. Neck: Supple, no JVD, carotid bruits, or masses. Cardiac: RRR, no murmurs, rubs, or gallops. No clubbing, cyanosis, edema.  Radials/DP/PT 2+ and equal bilaterally.  Respiratory:  Respirations regular and unlabored, clear to auscultation bilaterally. GI: Soft, nontender, nondistended. MS: No deformity or  atrophy. Skin: Warm and dry, no rash. Neuro: Strength and sensation are intact. Psych: Normal affect.  Accessory Clinical Findings    The following studies were reviewed for this visit: ***  Recent Labs: 04/22/2022: ALT 19; B Natriuretic Peptide 925.5; Magnesium 2.3 04/25/2022: TSH 0.157 04/28/2022: BUN 25; Creatinine, Ser 0.91; Hemoglobin 10.1; Platelets 318; Potassium 4.2; Sodium 134   Recent Lipid Panel    Component Value Date/Time   CHOL 137 04/25/2022 0500   CHOL 170 11/20/2021 1002   TRIG 71 04/25/2022 0500   HDL 60 04/25/2022 0500   HDL 66 11/20/2021 1002   CHOLHDL 2.3 04/25/2022 0500  VLDL 14 04/25/2022 0500   LDLCALC 63 04/25/2022 0500   LDLCALC 85 11/20/2021 1002    No BP recorded.  {Refresh Note OR Click here to enter BP  :1}***    ECG personally reviewed by me today: ***  No significant changes from ***  {Does this patient have ATRIAL FIBRILLATION?:[id number]}   Assessment & Plan   ***      Disposition: ***   Etta Grandchild. Coretha Creswell, DNP, NP-C     05/18/2022, 8:09 AM [facility] [address] Office [phone] Fax [phone]

## 2022-05-19 DIAGNOSIS — Z7952 Long term (current) use of systemic steroids: Secondary | ICD-10-CM | POA: Diagnosis not present

## 2022-05-19 DIAGNOSIS — Z923 Personal history of irradiation: Secondary | ICD-10-CM | POA: Diagnosis not present

## 2022-05-19 DIAGNOSIS — R200 Anesthesia of skin: Secondary | ICD-10-CM | POA: Diagnosis not present

## 2022-05-19 DIAGNOSIS — D649 Anemia, unspecified: Secondary | ICD-10-CM | POA: Diagnosis not present

## 2022-05-19 DIAGNOSIS — R059 Cough, unspecified: Secondary | ICD-10-CM | POA: Diagnosis not present

## 2022-05-19 DIAGNOSIS — E876 Hypokalemia: Secondary | ICD-10-CM | POA: Diagnosis not present

## 2022-05-19 DIAGNOSIS — C3411 Malignant neoplasm of upper lobe, right bronchus or lung: Secondary | ICD-10-CM | POA: Diagnosis not present

## 2022-05-19 DIAGNOSIS — M25519 Pain in unspecified shoulder: Secondary | ICD-10-CM | POA: Diagnosis not present

## 2022-05-19 DIAGNOSIS — C775 Secondary and unspecified malignant neoplasm of intrapelvic lymph nodes: Secondary | ICD-10-CM | POA: Diagnosis not present

## 2022-05-20 ENCOUNTER — Ambulatory Visit: Payer: Medicare Other | Admitting: General Practice

## 2022-05-20 NOTE — Progress Notes (Unsigned)
Cardiology Clinic Note   Patient Name: Alan Hurst Date of Encounter: 05/21/2022  Primary Care Provider:  Lorrene Reid, PA-C Primary Cardiologist:  Donato Heinz, MD  Patient Profile    Alan Hurst is a 77 y.o. male with a past medical history of nonobstructive CAD per [facility] December 2505, chronic systolic heart failure/NICM, hypertension, hyperlipidemia, COPD, T2DM, CKD stage IIIb who presents to the clinic today for hospital follow-up.  Past Medical History    Past Medical History:  Diagnosis Date   Adenocarcinoma of lung, stage 1 (HCC)    Alcoholism in recovery ([place])    Chronic kidney disease, stage 2 (mild)    COPD (chronic obstructive pulmonary disease) ([place])    Diabetes ([place])    Ectatic abdominal aorta (HCC)    Femur fracture, right (HCC)    GERD (gastroesophageal reflux disease) 12/05/2015   Well controlled on meds   H/O: substance abuse ([place]) 1999   Hip fracture ([place])    HLD (hyperlipidemia) 12/05/2015   10+ yrs at least.    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   [facility]   Sleep disorder 12/05/2015   Vitamin B deficiency 12/05/2015   Vitamin D insufficiency 12/05/2015   Past Surgical History:  Procedure Laterality Date   CATARACT EXTRACTION, BILATERAL  10/2016   COLONOSCOPY     FEMUR FRACTURE SURGERY     HIP FRACTURE SURGERY     JOINT REPLACEMENT Bilateral    hips   LEFT HEART CATH AND CORONARY ANGIOGRAPHY N/A 04/27/2022   Procedure: LEFT HEART CATH AND CORONARY ANGIOGRAPHY;  Surgeon: Burnell Blanks, MD;  Location: [place] [facility];  Service: Cardiovascular;  Laterality: N/A;   TIBIA FRACTURE SURGERY     TOTAL HIP REVISION Right 08/07/2016   Procedure: RIGHT TOTAL HIP REVISION;  Surgeon: Rod Can, MD;  Location: [place];  Service: Orthopedics;  Laterality: Right;    Allergies  Allergies  Allergen Reactions   No Known Allergies     History of Present Illness    TAGE FEGGINS has a past medical history of: Nonobstructive CAD. LHC 04/27/2022: Proximal RCA 30%, distal RCA 20%, RPA V 40%, ostial to proximal Cx 40%, ostial to proximal LAD 30%. Chronic systolic heart failure/NICM. Echo 04/23/2022: EF 40 to 45%.  Trivial MR. Hypertension. Hyperlipidemia. Lipid panel 04/25/2022: LDL 63, HDL 60, TG 71, total 137. COPD. T2DM. CKD stage IIIb.  Alan Hurst was first evaluated by cardiology on 04/24/2022 during hospitalization for worsening shortness of breath/DOE.  Patient reported a 73-month history of worsening shortness of breath with sensation of chest burning with exertion particularly when he did more than his normal activity.  He attributed the symptoms to COPD's so he would use his inhaler and rest until improved.  9 days prior to his admission he developed a productive cough and was evaluated at urgent care where he received steroids, inhaler, and cough syrup.  His symptoms continued to worsen and he was transported to the ER via EMS.  Initial labs: BNP 925, troponin x 4 522>> 506>> 300>> 179, potassium 3.1, magnesium normal, D-dimer 0.86.  COVID and flu negative.  Chest x-ray showed no acute process.  CTA was negative for PE and positive for small right pleural effusion.  Patient had recurrent chest pain morning after admission.  EKG did not show any ST-T wave changes.  No systolic dysfunction may represent demand ischemia in the setting of COPD exacerbation plan  for catheterization once will patient recovered from respiratory issues.  LHC showed nonobstructive CAD.  GDMT started at discharge.  Today, patient is alone. He states he is much improved from when he was discharged from the hospital. He reports his shortness of breath is back to baseline. He recently started a new inhaler and he feels it works better. Patient denies increased shortness of breath or dyspnea on exertion. No chest pain, pressure, or tightness. Denies lower extremity edema, orthopnea, or PND.  No palpitations. He does continue to have low energy. He is the primary caregiver for his wife who has MS and is a quadriplegic.  He does get some help from an aide 5 days a week otherwise he transfers his patient with a Harrel Lemon lift and assist her with all of her personal hygiene and other needs.  Denies any chest pain or shortness of breath with these activities.  They do have a consultation with [facility] support to hopefully get more help at home.   Home Medications    Current Meds  Medication Sig   abiraterone acetate (ZYTIGA) 250 MG tablet Take 1,000 mg by mouth daily.   aspirin EC 81 MG tablet Take 81 mg by mouth daily.   azithromycin (ZITHROMAX) 250 MG tablet Take 250 mg by mouth every other day.   beclomethasone (QVAR REDIHALER) 40 MCG/ACT inhaler Inhale 1 puff into the lungs 2 (two) times daily.   Cholecalciferol (VITAMIN D) 2000 units tablet Take 1 tablet by mouth daily.   ciclopirox (PENLAC) 8 % solution Apply topically at bedtime. Apply over nail and surrounding skin. Apply daily over previous coat. After seven (7) days, may remove with alcohol and continue cycle.   dapagliflozin propanediol (FARXIGA) 10 MG TABS tablet Take 1 tablet (10 mg total) by mouth daily before breakfast.   fluticasone (FLONASE) 50 MCG/ACT nasal spray Place 1 spray into both nostrils daily. (Patient taking differently: Place 1 spray into both nostrils daily as needed for allergies.)   gabapentin (NEURONTIN) 100 MG capsule Take 300 mg by mouth at bedtime.   guaiFENesin-dextromethorphan (ROBITUSSIN DM) 100-10 MG/5ML syrup Take 5 mLs by mouth every 6 (six) hours as needed for cough.   levalbuterol (XOPENEX HFA) 45 MCG/ACT inhaler Inhale 1 puff into the lungs every 4 (four) hours as needed for wheezing.   lovastatin (MEVACOR) 40 MG tablet TAKE 1 TABLET BY MOUTH AT  BEDTIME   magnesium oxide (MAG-OX) 400 MG tablet Take 400 mg by mouth daily.   melatonin 5 MG TABS Take 5 mg by mouth at bedtime.   metFORMIN (GLUCOPHAGE)  500 MG tablet Take 1 tablet (500 mg total) by mouth daily.   metoprolol succinate (TOPROL-XL) 25 MG 24 hr tablet Take 1 tablet (25 mg total) by mouth daily.   Omega-3 Fatty Acids (FISH OIL) 500 MG CAPS Take 500 mg by mouth daily.   omeprazole (PRILOSEC) 20 MG capsule TAKE 1 CAPSULE BY MOUTH DAILY   predniSONE (DELTASONE) 5 MG tablet Take 5 mg by mouth daily.   sacubitril-valsartan (ENTRESTO) 24-26 MG Take 1 tablet by mouth 2 (two) times daily.   spironolactone (ALDACTONE) 25 MG tablet Take 0.5 tablets (12.5 mg total) by mouth daily.   STIOLTO RESPIMAT 2.5-2.5 MCG/ACT AERS Inhale 2 each into the lungs daily.   vitamin B-12 (CYANOCOBALAMIN) 500 MCG tablet Take 500 mcg by mouth daily.    Family History    Family History  Problem Relation Age of Onset   Alcohol abuse Mother    Alcohol abuse  Father    Congestive Heart Failure Father    Kidney disease Father    Colon cancer Neg Hx    Esophageal cancer Neg Hx    Rectal cancer Neg Hx    Stomach cancer Neg Hx    He indicated that his mother is deceased. He indicated that his father is deceased. He indicated that the status of his neg hx is unknown.   Social History    Social History   Socioeconomic History   Marital status: Married    Spouse name: Alan Hurst   Number of children: 0   Years of education: Not on file   Highest education level: Not on file  Occupational History   Occupation: semi retired  Tobacco Use   Smoking status: Former    Packs/day: 1.50    Years: 32.00    Total pack years: 48.00    Types: Cigarettes    Start date: 05/11/1961    Quit date: 05/11/1993    Years since quitting: 29.0   Smokeless tobacco: Never  Vaping Use   Vaping Use: Never used  Substance and Sexual Activity   Alcohol use: Not Currently   Drug use: No   Sexual activity: Not Currently    Birth control/protection: None  Other Topics Concern   Not on file  Social History Narrative   Not on file   Social Determinants of Health   Financial  Resource Strain: Medium Risk (11/20/2021)   Overall Financial Resource Strain (CARDIA)    Difficulty of Paying Living Expenses: Somewhat hard  Food Insecurity: No Food Insecurity (04/23/2022)   Hunger Vital Sign    Worried About Running Out of Food in the Last Year: Never true    [place] in the Last Year: Never true  Transportation Needs: No Transportation Needs (04/29/2022)   PRAPARE - Hydrologist (Medical): No    Lack of Transportation (Non-Medical): No  Physical Activity: Insufficiently Active (11/20/2021)   Exercise Vital Sign    Days of Exercise per Week: 2 days    Minutes of Exercise per Session: 20 min  Stress: Stress Concern Present (11/20/2021)   [place]    Feeling of Stress : To some extent  Social Connections: Moderately Integrated (11/20/2021)   Social Connection and Isolation Panel [NHANES]    Frequency of Communication with Friends and Family: More than three times a week    Frequency of Social Gatherings with Friends and Family: Twice a week    Attends Religious Services: Never    Marine scientist or Organizations: Yes    Attends Music therapist: More than 4 times per year    Marital Status: Married  Human resources officer Violence: Not At Risk (04/23/2022)   Humiliation, Afraid, Rape, and Kick questionnaire    Fear of Current or Ex-Partner: No    Emotionally Abused: No    Physically Abused: No    Sexually Abused: No     Review of Systems    General:  No chills, fever, night sweats or weight changes.  Cardiovascular:  No chest pain, dyspnea on exertion, edema, orthopnea, palpitations, paroxysmal nocturnal dyspnea. Dermatological: No rash, lesions/masses Respiratory: No cough, dyspnea Urologic: No hematuria, dysuria Abdominal:   No nausea, vomiting, diarrhea, bright red blood per rectum, melena, or hematemesis Neurologic:  No visual changes,  weakness, changes in mental status. All other systems reviewed and are otherwise negative except as noted above.  Physical Exam    VS:  BP 118/72   Pulse 62   Ht 5\' 9"  (1.753 m)   Wt 185 lb 6.4 oz (84.1 kg)   SpO2 96%   BMI 27.38 kg/m  , BMI Body mass index is 27.38 kg/m. GEN:  Well nourished, well developed, in no acute distress. HEENT: Normal. Neck: Supple, no JVD, carotid bruits, or masses. Cardiac: RRR, no murmurs, rubs, or gallops. No clubbing, cyanosis.  Radials/DP/PT 2+ and equal bilaterally. Mild nonpitting edema bilateral ankles and trace pretibial edema.  Respiratory:  Respirations regular and unlabored, clear to auscultation bilaterally. GI: Soft, nontender, nondistended. MS: No deformity or atrophy. Skin: Warm and dry, no rash. Neuro: Strength and sensation are intact. Psych: Normal affect.  Accessory Clinical Findings   Recent Labs: 04/22/2022: ALT 19; B Natriuretic Peptide 925.5; Magnesium 2.3 04/25/2022: TSH 0.157 04/28/2022: BUN 25; Creatinine, Ser 0.91; Hemoglobin 10.1; Platelets 318; Potassium 4.2; Sodium 134   Recent Lipid Panel    Component Value Date/Time   CHOL 137 04/25/2022 0500   CHOL 170 11/20/2021 1002   TRIG 71 04/25/2022 0500   HDL 60 04/25/2022 0500   HDL 66 11/20/2021 1002   CHOLHDL 2.3 04/25/2022 0500   VLDL 14 04/25/2022 0500   LDLCALC 63 04/25/2022 0500   LDLCALC 85 11/20/2021 1002    ECG is not indicated today.    Assessment & Plan   Chronic systolic heart failure/NICM.  Echo December 2023 EF 40 to 45%.  Patient denies increased shortness of breath or DOE since discharge from hospital. He continues to have low energy. He denies lower extremity edema. On physical exam he has mild, nonpitting edema to bilateral ankles and trace pretibial edema. He has been wearing compression socks. Euvolemic and well compensated on exam. Breath sound clear to auscultation bilaterally. Will add [place]. Recheck BMP in two weeks. Continue  spironolactone, Entresto, metoprolol. Nonobstructive CAD.  LHC December 2023 showed nonobstructive disease in RCA, LAD and LCx.  Patient denies chest pain, pressure or tightness. He is able to care for his wife who has MS and is a quadriplegic without chest pain or shortness of breath. He uses a hoyer lift for transfers and assists her with hygiene and all other ADLs.  Continue aspirin, lovastatin, metoprolol. Hypertension.  BP today 18/72. Patient denies dizziness or headache. Continue metoprolol and Entresto.  Hyperlipidemia.  LDL December 2023 63, at goal.  Continue lovastatin.     Disposition: Start Farxiga 10 mg daily. BMP in two weeks. Return in 1 month.    Justice Britain. Itzae Miralles, DNP, NP-C     05/21/2022, 3:38 PM [place] [address] Office [phone] Fax [phone]

## 2022-05-21 ENCOUNTER — Ambulatory Visit: Payer: Medicare Other | Attending: General Practice | Admitting: Student

## 2022-05-21 ENCOUNTER — Encounter: Payer: Self-pay | Admitting: General Practice

## 2022-05-21 VITALS — BP 118/72 | HR 62 | Ht 69.0 in | Wt 185.4 lb

## 2022-05-21 DIAGNOSIS — I10 Essential (primary) hypertension: Secondary | ICD-10-CM

## 2022-05-21 DIAGNOSIS — E785 Hyperlipidemia, unspecified: Secondary | ICD-10-CM

## 2022-05-21 DIAGNOSIS — I428 Other cardiomyopathies: Secondary | ICD-10-CM

## 2022-05-21 DIAGNOSIS — I5022 Chronic systolic (congestive) heart failure: Secondary | ICD-10-CM | POA: Diagnosis not present

## 2022-05-21 DIAGNOSIS — I2510 Atherosclerotic heart disease of native coronary artery without angina pectoris: Secondary | ICD-10-CM | POA: Diagnosis not present

## 2022-05-21 MED ORDER — DAPAGLIFLOZIN PROPANEDIOL 10 MG PO TABS: 10.0000 mg | ORAL_TABLET | Freq: Every day | ORAL | 3 refills | Status: DC

## 2022-05-21 NOTE — Patient Instructions (Addendum)
Medication Instructions:  START FARXIGA 10MG  DAILY *If you need a refill on your cardiac medications before your next appointment, please call your pharmacy*  Lab Work: BMET IN 2 WEEKS If you have labs (blood work) drawn today and your tests are completely normal, you will receive your results only by: [place] (if you have MyChart) OR A paper copy in the mail  If you have any lab test that is abnormal or we need to change your treatment, we will call you to review the results.  Testing/Procedures: NONE  Other Instructions    Follow-Up: At [facility], you and your health needs are our priority.  As part of our continuing mission to provide you with exceptional heart care, we have created designated Provider Care Teams.  These Care Teams include your primary Cardiologist (physician) and Advanced Practice Providers (APPs -  Physician Assistants and Nurse Practitioners) who all work together to provide you with the care you need, when you need it.  Your next appointment:   1 month(s)  Provider:   Donato Heinz, MD  or Coletta Memos, FNP or Mayra Reel, NP

## 2022-05-26 LAB — HEMOGLOBIN A1C: Hemoglobin A1C: 5.8

## 2022-06-19 ENCOUNTER — Ambulatory Visit: Payer: Medicare Other | Admitting: General Practice

## 2022-06-19 DIAGNOSIS — I5021 Acute systolic (congestive) heart failure: Secondary | ICD-10-CM | POA: Diagnosis not present

## 2022-06-20 LAB — BASIC METABOLIC PANEL
BUN/Creatinine Ratio: 15 (ref 10–24)
BUN: 20 mg/dL (ref 8–27)
CO2: 19 mmol/L — ABNORMAL LOW (ref 20–29)
Calcium: 9.4 mg/dL (ref 8.6–10.2)
Chloride: 109 mmol/L — ABNORMAL HIGH (ref 96–106)
Creatinine, Ser: 1.32 mg/dL — ABNORMAL HIGH (ref 0.76–1.27)
Glucose: 92 mg/dL (ref 70–99)
Potassium: 4.6 mmol/L (ref 3.5–5.2)
Sodium: 142 mmol/L (ref 134–144)
eGFR: 56 mL/min/{1.73_m2} — ABNORMAL LOW (ref >59)

## 2022-06-23 ENCOUNTER — Other Ambulatory Visit: Payer: Self-pay

## 2022-06-23 ENCOUNTER — Encounter: Payer: Self-pay | Admitting: Gastroenterology

## 2022-06-23 DIAGNOSIS — I5022 Chronic systolic (congestive) heart failure: Secondary | ICD-10-CM

## 2022-06-23 NOTE — Progress Notes (Unsigned)
Cardiology Office Note:    Date:  06/25/2022   ID:  Alan Hurst, Alan Hurst, Alan Hurst  PCP:  Lorrene Reid, [place] Prov[facility] Cardiologist:  Donato Heinz, MD {   Referring MD: Lorrene Reid, PA-C   Chief Complaint  Patient presents with   Shortness of Breath   Follow-up    CHF    History of Present Illness:    Alan Hurst is a 77 y.o. 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   Patient was admitted to the hospital in 12/23 for treatment of acute hypoxic respiratory failure, acute exacerbation of COPD, chest pain.  Troponin was elevated to 522 at time of admission, trended down to 179.  While it was possible that elevated troponin and chest pain could be related to demand ischemia in the setting of COPD exacerbation, it was noted that patient did have multiple risk factors for CAD including age, DM, hypertension, hyperlipidemia, remote history of tobacco abuse.  He underwent echocardiogram on 04/23/2022 that was a technically difficult study due to poor echo windows.  However, showed EF 40-45%.  Patient underwent left heart catheterization on 04/27/2022 that showed mild, nonobstructive CAD.  Recommended medical management of CAD and nonischemic cardiomyopathy.  Patient was discharged on Entresto, spironolactone, Toprol-XL, pravastatin, aspirin.  Patient was seen on 05/21/2022 for hospital follow-up.  At that time, patient reported that he was feeling much better since being discharged from the hospital.  Breathing was back to baseline.  Denied increased shortness of breath, dyspnea on exertion, chest pain, lower extremity edema, palpitations.  W[place] Glade was added at that time.  Today, patient reports that  he has been having some shortness of breath on exertion.  Believes that he is "coming down with a cold".  Denies nasal congestion, fever, chills, body aches, sore throat.  Mainly complains of shortness of breath on exertion.  Reports that he is able to walk up a slight incline in his backyard without developing shortness of breath, but when he is trying to carry something or trying something of that same incline he does notice shortness of breath.  He has an occasional cough that produces a clear sputum.  Early this morning, patient did notice his breathing was a little worse when laying flat on his back.  He denies PND.  Denies ankle edema, abdominal distention, weight gain.  He was in the hospital in December, reports that since then he has only been using 1 inhaler for his COPD.  He has only been using levalbuterol tartrate.  Per review of discharge summary, patient was also supposed to be using beclomethasone diprop HFA and tioptropium bromide-olodaterol.   Past Medical History:  Diagnosis Date   Adenocarcinoma of lung, stage 1 (HCC)    Alcoholism in recovery ([place])    Chronic kidney disease, stage 2 (mild)    COPD (chronic obstructive pulmonary disease) ([place])    Diabetes ([place])    Ectatic abdominal aorta (HCC)    Femur fracture, right (HCC)    GERD (gastroesophageal reflux disease) 12/05/2015   Well controlled on meds   H/O: substance abuse ([place]) 1999   Hip fracture ([place])    HLD (hyperlipidemia) 12/05/2015   10+ yrs at least.    Hypertension    Prostate cancer ([place])    Psoriasiform eczema  12/05/2015   [facility]   Sleep disorder 12/05/2015   Vitamin B deficiency 12/05/2015   Vitamin D insufficiency 12/05/2015    Past Surgical History:  Procedure Laterality Date   CATARACT EXTRACTION, BILATERAL  10/2016   COLONOSCOPY     FEMUR FRACTURE SURGERY     HIP FRACTURE SURGERY     JOINT REPLACEMENT Bilateral    hips   LEFT HEART CATH AND CORONARY ANGIOGRAPHY N/A 04/27/2022    Procedure: LEFT HEART CATH AND CORONARY ANGIOGRAPHY;  Surgeon: Burnell Blanks, MD;  Location: [facility];  Service: Cardiovascular;  Laterality: N/A;   TIBIA FRACTURE SURGERY     TOTAL HIP REVISION Right 08/07/2016   Procedure: RIGHT TOTAL HIP REVISION;  Surgeon: Rod Can, MD;  Location: [facility];  Service: Orthopedics;  Laterality: Right;    Current Medications: Current Meds  Medication Sig   abiraterone acetate (ZYTIGA) 250 MG tablet Take 1,000 mg by mouth daily.   aspirin EC 81 MG tablet Take 81 mg by mouth daily.   azithromycin (ZITHROMAX) 250 MG tablet Take 250 mg by mouth every other day.   Cholecalciferol (VITAMIN D) 2000 units tablet Take 1 tablet by mouth daily.   dapagliflozin propanediol (FARXIGA) 10 MG TABS tablet Take 1 tablet (10 mg total) by mouth daily before breakfast.   fluticasone (FLONASE) 50 MCG/ACT nasal spray Place 1 spray into both nostrils daily. (Patient taking differently: Place 1 spray into both nostrils daily as needed for allergies.)   gabapentin (NEURONTIN) 100 MG capsule Take 300 mg by mouth at bedtime.   guaiFENesin-dextromethorphan (ROBITUSSIN DM) 100-10 MG/5ML syrup Take 5 mLs by mouth every 6 (six) hours as needed for cough.   levalbuterol (XOPENEX HFA) 45 MCG/ACT inhaler Inhale 1 puff into the lungs every 4 (four) hours as needed for wheezing.   lovastatin (MEVACOR) 40 MG tablet TAKE 1 TABLET BY MOUTH AT  BEDTIME   magnesium oxide (MAG-OX) 400 MG tablet Take 400 mg by mouth daily.   melatonin 5 MG TABS Take 5 mg by mouth at bedtime.   metFORMIN (GLUCOPHAGE) 500 MG tablet Take 1 tablet (500 mg total) by mouth daily.   metoprolol succinate (TOPROL-XL) 25 MG 24 hr tablet Take 1 tablet (25 mg total) by mouth daily.   Omega-3 Fatty Acids (FISH OIL) 500 MG CAPS Take 500 mg by mouth daily.   omeprazole (PRILOSEC) 20 MG capsule TAKE 1 CAPSULE BY MOUTH DAILY   predniSONE (DELTASONE) 5 MG tablet Take 5 mg by mouth daily.   sacubitril-valsartan  (ENTRESTO) 24-26 MG Take 1 tablet by mouth 2 (two) times daily.   spironolactone (ALDACTONE) 25 MG tablet Take 0.5 tablets (12.5 mg total) by mouth daily.   vitamin B-12 (CYANOCOBALAMIN) 500 MCG tablet Take 500 mcg by mouth daily.     Allergies:   No known allergies   Social History   Socioeconomic History   Marital status: Married    Spouse name: Marcie Bal   Number of children: 0   Years of education: Not on file   Highest education level: Not on file  Occupational History   Occupation: semi retired  Tobacco Use   Smoking status: Former    Packs/day: 1.50    Years: 32.00    Total pack years: 48.00    Types: Cigarettes    Start date: 05/11/1961    Quit date: 05/11/1993    Years since quitting: 29.1   Smokeless tobacco: Never  Vaping Use   Vaping Use: Never used  Substance and Sexual Activity   Alcohol use: Not Currently   Drug use: No   Sexual activity: Not Currently    Birth control/protection: None  Other Topics Concern   Not on file  Social History Narrative   Not on file   Social Determinants of Health   Financial Resource Strain: Medium Risk (11/20/2021)   Overall Financial Resource Strain (CARDIA)    Difficulty of Paying Living Expenses: Somewhat hard  Food Insecurity: No Food Insecurity (04/23/2022)   Hunger Vital Sign    Worried About Running Out of Food in the Last Year: Never true    Ran Out of Food in the Last Year: Never true  Transportation Needs: No Transportation Needs (04/29/2022)   PRAPARE - Hydrologist (Medical): No    Lack of Transportation (Non-Medical): No  Physical Activity: Insufficiently Active (11/20/2021)   Exercise Vital Sign    Days of Exercise per Week: 2 days    Minutes of Exercise per Session: 20 min  Stress: Stress Concern Present (11/20/2021)   [place]    Feeling of Stress : To some extent  Social Connections: Moderately Integrated  (11/20/2021)   Social Connection and Isolation Panel [NHANES]    Frequency of Communication with Friends and Family: More than three times a week    Frequency of Social Gatherings with Friends and Family: Twice a week    Attends Religious Services: Never    Marine scientist or Organizations: Yes    Attends Music therapist: More than 4 times per year    Marital Status: Married     Family History: The patient's family history includes Alcohol abuse in his father and mother; Congestive Heart Failure in his father; Kidney disease in his father. There is no history of Colon cancer, Esophageal cancer, Rectal cancer, or Stomach cancer.  ROS:   Please see the history of present illness.     All other systems reviewed and are negative.  EKGs/Labs/Other Studies Reviewed:    The following studies were reviewed today:  LHC 04/27/22   Prox RCA lesion is 30% stenosed.   Dist RCA lesion is 20% stenosed.   RPAV lesion is 40% stenosed.   Ost Cx to Prox Cx lesion is 40% stenosed.   Ost LAD to Prox LAD lesion is 30% stenosed.   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 Consider Entresto before discharge.   Echocardiogram 04/23/22 1. Left ventricular ejection fraction, by estimation, is 40 to 45%. The  left ventricle has mildly decreased function. Left ventricular endocardial  border not optimally defined to evaluate regional wall motion.  Indeterminate diastolic filling due to E-A  fusion.   2. Right ventricular systolic function is normal. The right ventricular  size is normal. There is normal pulmonary artery systolic pressure.   3. The mitral valve is degenerative. Trivial mitral valve regurgitation.  No evidence of mitral stenosis.   4. The aortic valve is normal in structure. Aortic valve regurgitation is  not visualized. No aortic stenosis is present.   5. The inferior vena cava is normal in  size with greater than 50%  respiratory variability, suggesting right atrial pressure of 3 mmHg.   Recent Labs: 04/22/2022: ALT 19; B Natriuretic Peptide 925.5; Magnesium 2.3 04/25/2022: TSH 0.157 04/28/2022: Hemoglobin 10.1; Platelets 318 06/19/2022: BUN 20; Creatinine, Ser 1.32; Potassium 4.6; Sodium 142  Recent Lipid  Panel    Component Value Date/Time   CHOL 137 04/25/2022 0500   CHOL 170 11/20/2021 1002   TRIG 71 04/25/2022 0500   HDL 60 04/25/2022 0500   HDL 66 11/20/2021 1002   CHOLHDL 2.3 04/25/2022 0500   VLDL 14 04/25/2022 0500   LDLCALC 63 04/25/2022 0500   LDLCALC 85 11/20/2021 1002     Risk Assessment/Calculations:                Physical Exam:    VS:  BP (!) 136/52 (BP Location: Left Arm, Patient Position: Sitting, Cuff Size: Large)   Pulse 71   Ht 5\' 9"  (1.753 m)   Wt 183 lb 6.4 oz (83.2 kg)   SpO2 97%   BMI 27.08 kg/m     Wt Readings from Last 3 Encounters:  06/25/22 183 lb 6.4 oz (83.2 kg)  05/21/22 185 lb 6.4 oz (84.1 kg)  04/26/22 187 lb 13.3 oz (85.2 kg)     GEN: Well nourished, well developed in no acute distress. Sitting comfortably in the chair  HEENT: Normal NECK: No JVD while sitting upright  CARDIAC: Distant heart sounds. RRR, no murmurs, rubs, gallops. Radial pulses 2+ bilaterally  RESPIRATORY:  Crackles in bilateral lung bases, otherwise clear to auscultation bilaterally. Normal WOB on room air  ABDOMEN: Soft, non-tender, non-distended MUSCULOSKELETAL:  No edema in BLE; No deformity  SKIN: Warm and dry NEUROLOGIC:  Alert and oriented x 3 PSYCHIATRIC:  Normal affect   ASSESSMENT:    1. Nonobstructive atherosclerosis of coronary artery   2. Chronic systolic (congestive) heart failure ([place])   3. Dyspnea on exertion   4. Hyperlipidemia LDL goal <70   5. Primary hypertension   6. Chronic obstructive pulmonary disease, unspecified COPD type ([place])    PLAN:    In order of problems listed above:  Chronic systolic heart  failure Nonischemic cardiomyopathy - Echocardiogram in 04/2022 showed EF 40-45%.  Note that the study was technically difficult due to poor echo windows. - Cardiac catheterization in 04/2022 showed mild, nonobstructive CAD - Continue Farxiga 10 mg daily, Toprol-XL 25 mg daily, Entresto 24-26 mg twice daily, spironolactone 12.5 mg daily -BMP from 06/19/2022 showed creatinine had mildly increased to 1.32 after starting [place]. Rechecking BMP today  - Instructed patient to obtain a scale and to weigh himself at the same time every day.  - Ordered echocardiogram to be completed in 2 months (after approx 4 months of GDMT)   Dyspnea on Exertion  COPD  - Patient has noticed that he has been having more shortness of breath on exertion recently. Reports that he can walk up an incline in his back yard without shortness of breath, but will have shortness of breath when trying to carry something up that same incline.  - Patient was discharged from the hospital on 04/28/22 and was instructed to use beclomethasone diprop HFA, levalbuterol tartrate, and tiotropium bromide-Olodaterol inhalers. He has only been using the levalbuterol  - Instructed patient to use all three inhalers as they are prescribed. Also encouraged him to follow up with his pulmonologist as he has not had an appointment since his COPD exacerbation in 04/2022  - Patient does have crackles in lung bases but is otherwise euvolemic on exam. No ankle edema, abdominal distention, JVD. Denies recent weight gain,  - Ordered BNP. If elevated, can consider starting low dose lasix   Mild, nonobstructive CAD -Cardiac catheterization from 04/27/2022 showed 30% stenosis in proximal RCA, 20% stenosis in distal RCA,  40% stenosis in RPA V, 40% stenosis in ostial circumflex, 30% stenosis in ostial LAD -Patient denies recurrence of chest pain.  -Continue daily aspirin, Toprol-XL 25 mg daily, lovastatin 40 mg daily  HLD -Goal LDL less than 70 -Lipid panel in  12/23 showed LDL 63 -Continue lovastatin 40 mg daily  HTN - BP 136/52 in office today. Patient does take his BP at home and reports that it is usually in the 120s/70s  - BP managed with heart failure medications as above   Medication Adjustments/Labs and Tests Ordered: Current medicines are reviewed at length with the patient today.  Concerns regarding medicines are outlined above.  Orders Placed This Encounter  Procedures   Brain natriuretic peptide   ECHOCARDIOGRAM COMPLETE   No orders of the defined types were placed in this encounter.   Patient Instructions  Medication Instructions:  DISCUSS INHALERS *If you need a refill on your cardiac medications before your next appointment, please call your pharmacy*  Lab Work: BMET AND BNP TODAY If you have labs (blood work) drawn today and your tests are completely normal, you will receive your results only by: [place] (if you have MyChart) OR  A paper copy in the mail If you have any lab test that is abnormal or we need to change your treatment, we will call you to review the results.  Testing/Procedures: Echocardiogram **SCHEDULE IN APRIL**- Your physician has requested that you have an echocardiogram. Echocardiography is a painless test that uses sound waves to create images of your heart. It provides your doctor with information about the size and shape of your heart and how well your heart's chambers and valves are working. This procedure takes approximately one hour. There are no restrictions for this procedure.   Follow-Up: At [facility], you and your health needs are our priority.  As part of our continuing mission to provide you with exceptional heart care, we have created designated Provider Care Teams.  These Care Teams include your primary Cardiologist (physician) and Advanced Practice Providers (APPs -  Physician Assistants and Nurse Practitioners) who all work together to provide you with the care you need,  when you need it.  We recommend signing up for the patient portal called "MyChart".  Sign up information is provided on this After Visit Summary.  MyChart is used to connect with patients for Virtual Visits (Telemedicine).  Patients are able to view lab/test results, encounter notes, upcoming appointments, etc.  Non-urgent messages can be sent to your provider as well.   To learn more about what you can do with MyChart, go to [email or web address].    Your next appointment:   3-4 month(s)  Provider:   Coletta Memos, FNP or Mayra Reel, NP      Other Instructions TAKE AND LOG YOUR WEIGHT DAILY CALL WITH WEIGHT GAIN OF 3 POUNDS OVERNIGHT OR 5 POUNDS IN A WEEK.    Signed, Margie Billet, PA-C  06/25/2022 2:33 PM    [place]

## 2022-06-25 ENCOUNTER — Encounter: Payer: Self-pay | Admitting: General Practice

## 2022-06-25 ENCOUNTER — Ambulatory Visit: Payer: Medicare Other | Attending: General Practice | Admitting: Cardiology

## 2022-06-25 VITALS — BP 136/52 | HR 71 | Ht 69.0 in | Wt 183.4 lb

## 2022-06-25 DIAGNOSIS — R0609 Other forms of dyspnea: Secondary | ICD-10-CM | POA: Diagnosis not present

## 2022-06-25 DIAGNOSIS — I5022 Chronic systolic (congestive) heart failure: Secondary | ICD-10-CM

## 2022-06-25 DIAGNOSIS — E785 Hyperlipidemia, unspecified: Secondary | ICD-10-CM

## 2022-06-25 DIAGNOSIS — I10 Essential (primary) hypertension: Secondary | ICD-10-CM

## 2022-06-25 DIAGNOSIS — I2510 Atherosclerotic heart disease of native coronary artery without angina pectoris: Secondary | ICD-10-CM

## 2022-06-25 DIAGNOSIS — J449 Chronic obstructive pulmonary disease, unspecified: Secondary | ICD-10-CM

## 2022-06-25 NOTE — Patient Instructions (Signed)
Medication Instructions:  DISCUSS INHALERS *If you need a refill on your cardiac medications before your next appointment, please call your pharmacy*  Lab Work: BMET AND BNP TODAY If you have labs (blood work) drawn today and your tests are completely normal, you will receive your results only by: [place] (if you have MyChart) OR  A paper copy in the mail If you have any lab test that is abnormal or we need to change your treatment, we will call you to review the results.  Testing/Procedures: Echocardiogram **SCHEDULE IN APRIL**- Your physician has requested that you have an echocardiogram. Echocardiography is a painless test that uses sound waves to create images of your heart. It provides your doctor with information about the size and shape of your heart and how well your heart's chambers and valves are working. This procedure takes approximately one hour. There are no restrictions for this procedure.   Follow-Up: At [facility], you and your health needs are our priority.  As part of our continuing mission to provide you with exceptional heart care, we have created designated Provider Care Teams.  These Care Teams include your primary Cardiologist (physician) and Advanced Practice Providers (APPs -  Physician Assistants and Nurse Practitioners) who all work together to provide you with the care you need, when you need it.  We recommend signing up for the patient portal called "MyChart".  Sign up information is provided on this After Visit Summary.  MyChart is used to connect with patients for Virtual Visits (Telemedicine).  Patients are able to view lab/test results, encounter notes, upcoming appointments, etc.  Non-urgent messages can be sent to your provider as well.   To learn more about what you can do with MyChart, go to [email or web address].    Your next appointment:   3-4 month(s)  Provider:   Coletta Memos, FNP or Mayra Reel, NP      Other  Instructions TAKE AND LOG YOUR WEIGHT DAILY CALL WITH WEIGHT GAIN OF 3 POUNDS OVERNIGHT OR 5 POUNDS IN A WEEK.

## 2022-06-26 LAB — BASIC METABOLIC PANEL
BUN/Creatinine Ratio: 16 (ref 10–24)
BUN: 20 mg/dL (ref 8–27)
CO2: 19 mmol/L — ABNORMAL LOW (ref 20–29)
Calcium: 9.6 mg/dL (ref 8.6–10.2)
Chloride: 105 mmol/L (ref 96–106)
Creatinine, Ser: 1.22 mg/dL (ref 0.76–1.27)
Glucose: 100 mg/dL — ABNORMAL HIGH (ref 70–99)
Potassium: 4.9 mmol/L (ref 3.5–5.2)
Sodium: 140 mmol/L (ref 134–144)
eGFR: 61 mL/min/{1.73_m2} (ref >59)

## 2022-06-26 LAB — BRAIN NATRIURETIC PEPTIDE: BNP: 76.9 pg/mL (ref 0.0–100.0)

## 2022-06-28 ENCOUNTER — Encounter: Payer: Self-pay | Admitting: Emergency Medicine

## 2022-06-28 ENCOUNTER — Other Ambulatory Visit: Payer: Self-pay

## 2022-06-28 ENCOUNTER — Ambulatory Visit
Admission: EM | Admit: 2022-06-28 | Discharge: 2022-06-28 | Disposition: A | Discharge status: Home / Self Care | Payer: Medicare Other | Attending: Emergency Medicine | Admitting: Emergency Medicine

## 2022-06-28 DIAGNOSIS — J441 Chronic obstructive pulmonary disease with (acute) exacerbation: Secondary | ICD-10-CM

## 2022-06-28 DIAGNOSIS — J069 Acute upper respiratory infection, unspecified: Secondary | ICD-10-CM | POA: Diagnosis not present

## 2022-06-28 MED ORDER — STIOLTO RESPIMAT 2.5-2.5 MCG/ACT IN AERS: 2.0000 | INHALATION_SPRAY | Freq: Every day | RESPIRATORY_TRACT | 1 refills | Status: DC

## 2022-06-28 MED ORDER — LEVALBUTEROL TARTRATE 45 MCG/ACT IN AERO: 1.0000 | INHALATION_SPRAY | RESPIRATORY_TRACT | 2 refills | Status: DC | PRN

## 2022-06-28 MED ORDER — AMOXICILLIN-POT CLAVULANATE 875-125 MG PO TABS: 1.0000 | ORAL_TABLET | Freq: Two times a day (BID) | ORAL | 0 refills | Status: DC

## 2022-06-28 MED ORDER — QVAR REDIHALER 40 MCG/ACT IN AERB: 1.0000 | INHALATION_SPRAY | Freq: Two times a day (BID) | RESPIRATORY_TRACT | 0 refills | Status: DC

## 2022-06-28 MED ORDER — PREDNISONE 20 MG PO TABS: 40.0000 mg | ORAL_TABLET | Freq: Every day | ORAL | 0 refills | Status: DC

## 2022-06-28 NOTE — ED Triage Notes (Signed)
Pt here for cough and congestion x 10 days; pt sts hx of same that led to hospitalization

## 2022-06-28 NOTE — Discharge Instructions (Signed)
Today you are being treated for upper respiratory infection and COPD exacerbation  Begin Augmentin every morning and every evening for 7 days, daily begin to see improvement in about 48 hours  Begin use of prednisone 40 mg every morning for 5 days, stop use of your 5 mg tablets until short-term course has been completed  All 3 of your inhalers have been sent to the pharmacy with 1 refill given  Stiolto and Qvar inhaler are daily maintenance inhalers and you need to use them every day as directed  Levalbuterol inhaler is your rescue inhaler and may be used when shortness of breath and wheezing are flared  At any point if your breathing worsens you need to go to the nearest emergency department for evaluation of IV medications  If your symptoms continue to persist but do not worsen please follow-up with your primary doctor or pulmonologist

## 2022-06-28 NOTE — ED Provider Notes (Signed)
[facility]    CSN: [id number] Arrival date & time: 06/28/22  1036      History   Chief Complaint Chief Complaint  Patient presents with   Cough    HPI Alan Hurst is a 77 y.o. male.   Patient presents for evaluation of nasal congestion, rhinorrhea increased shortness of breath and wheezing present for 10 days.  Shortness of breath at rest, worsened when laying down.  Wheezing is occurring throughout the daytime.  Has attempted use of over-the-counter cough syrup and Tylenol which has been minimally effective.  Was evaluated by his cardiologist earlier this week and was noted to not be taking inhalers as directed, after recent hospital admission for COPD he was instructed to take all 3 inhalers but has only been using the levalbuterol.  Denies fever, chills or bodyaches.  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   [facility]   Sleep disorder 12/05/2015   Vitamin B deficiency 12/05/2015   Vitamin D insufficiency 12/05/2015    Patient Active Problem List   Diagnosis Date Noted   Elevated troponin 96/29/5284   Acute systolic heart failure ([place]) 04/25/2022   COPD with acute exacerbation ([place]) 04/22/2022   NSTEMI (non-ST elevated myocardial infarction) ([place]) 04/22/2022   Hypokalemia 04/22/2022   Lactic acidosis 04/22/2022   Radiation pneumonitis ([place]) 05/15/2021   Chronic obstructive pulmonary disease ([place]) 03/11/2021   Malignant neoplasm of  upper lobe of right lung ([place]) 11/13/2020   Lung nodule 09/26/2020   Prostate cancer, primary, with metastasis from prostate to other site [facility]) 06/19/2020   PSA elevation 04/03/2020   Environmental and seasonal allergies 06/14/2017   Bite, insect 04/05/2017   Health education/counseling 03/25/2017   Allergic rhinitis 03/25/2017   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, without long-term current use of insulin ([place]) 03/01/2017   Mixed diabetic hyperlipidemia associated with type 2 diabetes mellitus ([place]) 03/01/2017   Hypertension associated with diabetes ([place]) 03/01/2017   Cough in adult at night primarily when lying down 03/01/2017   Vitamin D deficiency 03/01/2017   Rash and nonspecific skin eruption 09/22/2016   Periprosthetic fracture of femur following total replacement of hip 08/07/2016   Fall    Essential hypertension    Femur fracture ([place]) 08/04/2016   Chronic kidney disease- stage 3a 01/02/2016   Type 2 diabetes mellitus without complication, without long-term current use of insulin ([place]) 12/05/2015   GERD (gastroesophageal reflux disease) 12/05/2015   Sleep disorder 12/05/2015   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   Vitamin D insufficiency 12/05/2015   Vitamin B deficiency 12/05/2015   Recovering alcoholic in remission ([place]) 12/05/2015   History of tobacco abuse 12/05/2015    Past Surgical History:  Procedure Laterality Date   CATARACT EXTRACTION, BILATERAL  10/2016   COLONOSCOPY     FEMUR FRACTURE SURGERY     HIP FRACTURE SURGERY  JOINT REPLACEMENT Bilateral    hips   LEFT HEART CATH AND CORONARY ANGIOGRAPHY N/A 04/27/2022   Procedure: LEFT HEART CATH AND CORONARY ANGIOGRAPHY;  Surgeon: Burnell Blanks, MD;  Location: [facility];  Service: Cardiovascular;  Laterality: N/A;   TIBIA FRACTURE SURGERY     TOTAL HIP REVISION Right 08/07/2016   Procedure: RIGHT TOTAL HIP REVISION;  Surgeon: Rod Can, MD;   Location: [facility];  Service: Orthopedics;  Laterality: Right;       Home Medications    Prior to Admission medications   Medication Sig Start Date End Date Taking? Authorizing Provider  abiraterone acetate (ZYTIGA) 250 MG tablet Take 1,000 mg by mouth daily. 09/16/20   [provider]  aspirin EC 81 MG tablet Take 81 mg by mouth daily.    [provider]  azithromycin (ZITHROMAX) 250 MG tablet Take 250 mg by mouth every other day. 04/18/22   [provider]  beclomethasone (QVAR REDIHALER) 40 MCG/ACT inhaler Inhale 1 puff into the lungs 2 (two) times daily. Patient not taking: Reported on 06/25/2022 04/21/22   Teodora Medici, FNP  Cholecalciferol (VITAMIN D) 2000 units tablet Take 1 tablet by mouth daily. 04/25/13   [provider]  ciclopirox (PENLAC) 8 % solution Apply topically at bedtime. Apply over nail and surrounding skin. Apply daily over previous coat. After seven (7) days, may remove with alcohol and continue cycle. Patient not taking: Reported on 06/25/2022 03/03/22   Lorrene Reid, PA-C  dapagliflozin propanediol (FARXIGA) 10 MG TABS tablet Take 1 tablet (10 mg total) by mouth daily before breakfast. 05/21/22   Mayra Reel, NP  fluticasone (FLONASE) 50 MCG/ACT nasal spray Place 1 spray into both nostrils daily. Patient taking differently: Place 1 spray into both nostrils daily as needed for allergies. 11/20/21   Lorrene Reid, PA-C  gabapentin (NEURONTIN) 100 MG capsule Take 300 mg by mouth at bedtime. 04/20/22   [provider]  guaiFENesin-dextromethorphan (ROBITUSSIN DM) 100-10 MG/5ML syrup Take 5 mLs by mouth every 6 (six) hours as needed for cough. 04/28/22   Terrilee Croak, MD  levalbuterol (XOPENEX HFA) 45 MCG/ACT inhaler Inhale 1 puff into the lungs every 4 (four) hours as needed for wheezing. 04/28/22 04/28/23  Terrilee Croak, MD  lovastatin (MEVACOR) 40 MG tablet TAKE 1 TABLET BY MOUTH AT  BEDTIME 01/23/22   Ronnell Freshwater,  NP  magnesium oxide (MAG-OX) 400 MG tablet Take 400 mg by mouth daily.    [provider]  melatonin 5 MG TABS Take 5 mg by mouth at bedtime.    [provider]  metFORMIN (GLUCOPHAGE) 500 MG tablet Take 1 tablet (500 mg total) by mouth daily. 05/06/22   Ronnell Freshwater, NP  metoprolol succinate (TOPROL-XL) 25 MG 24 hr tablet Take 1 tablet (25 mg total) by mouth daily. 04/29/22 07/28/22  Terrilee Croak, MD  Omega-3 Fatty Acids (FISH OIL) 500 MG CAPS Take 500 mg by mouth daily. 04/25/13   [provider]  omeprazole (PRILOSEC) 20 MG capsule TAKE 1 CAPSULE BY MOUTH DAILY 02/18/22   Abonza, Maritza, PA-C  predniSONE (DELTASONE) 5 MG tablet Take 5 mg by mouth daily. 09/16/20   [provider]  sacubitril-valsartan (ENTRESTO) 24-26 MG Take 1 tablet by mouth 2 (two) times daily. 04/29/22 07/28/22  Terrilee Croak, MD  spironolactone (ALDACTONE) 25 MG tablet Take 0.5 tablets (12.5 mg total) by mouth daily. 04/29/22 07/28/22  Terrilee Croak, MD  STIOLTO RESPIMAT 2.5-2.5 MCG/ACT AERS Inhale 2 each  into the lungs daily. Patient not taking: Reported on 06/25/2022 01/20/22   [provider]  vitamin B-12 (CYANOCOBALAMIN) 500 MCG tablet Take 500 mcg by mouth daily.    [provider]    Family History Family History  Problem Relation Age of Onset   Alcohol abuse Mother    Alcohol abuse Father    Congestive Heart Failure Father    Kidney disease Father    Colon cancer Neg Hx    Esophageal cancer Neg Hx    Rectal cancer Neg Hx    Stomach cancer Neg Hx     Social History Social History   Tobacco Use   Smoking status: Former    Packs/day: 1.50    Years: 32.00    Total pack years: 48.00    Types: Cigarettes    Start date: 05/11/1961    Quit date: 05/11/1993    Years since quitting: 29.1   Smokeless tobacco: Never  Vaping Use   Vaping Use: Never used  Substance Use Topics   Alcohol use: Not Currently   Drug use: No     Allergies   No known  allergies   Review of Systems Review of Systems  Constitutional: Negative.   HENT:  Positive for congestion and rhinorrhea. Negative for dental problem, drooling, ear discharge, ear pain, facial swelling, hearing loss, mouth sores, nosebleeds, postnasal drip, sinus pressure, sinus pain, sneezing, sore throat, tinnitus, trouble swallowing and voice change.   Respiratory:  Positive for cough, shortness of breath and wheezing. Negative for apnea, choking, chest tightness and stridor.   Skin: Negative.   Neurological: Negative.      Physical Exam Triage Vital Signs ED Triage Vitals  Enc Vitals Group     BP 06/28/22 1106 112/64     Pulse Rate 06/28/22 1106 83     Resp 06/28/22 1106 18     Temp 06/28/22 1106 97.8 F (36.6 C)     Temp Source 06/28/22 1106 Oral     SpO2 06/28/22 1106 96 %     Weight --      Height --      Head Circumference --      Peak Flow --      Pain Score 06/28/22 1108 0     Pain Loc --      Pain Edu? --      Excl. in [place]? --    No data found.  Updated Vital Signs BP 112/64 (BP Location: Left Arm)   Pulse 83   Temp 97.8 F (36.6 C) (Oral)   Resp 18   SpO2 96%   Visual Acuity Right Eye Distance:   Left Eye Distance:   Bilateral Distance:    Right Eye Near:   Left Eye Near:    Bilateral Near:     Physical Exam Constitutional:      Appearance: Normal appearance.  HENT:     Head: Normocephalic.     Right Ear: Tympanic membrane, ear canal and external ear normal.     Left Ear: Tympanic membrane, ear canal and external ear normal.     Nose: Nose normal.     Mouth/Throat:     Mouth: Mucous membranes are moist.     Pharynx: Posterior oropharyngeal erythema present.  Cardiovascular:     Rate and Rhythm: Normal rate and regular rhythm.     Pulses: Normal pulses.     Heart sounds: Normal heart sounds.  Pulmonary:     Effort: Pulmonary effort is  normal.     Breath sounds: Normal breath sounds.  Neurological:     Mental Status: He is alert and  oriented to person, place, and time. Mental status is at baseline.      UC Treatments / Results  Labs (all labs ordered are listed, but only abnormal results are displayed) Labs Reviewed - No data to display  EKG   Radiology No results found.  Procedures Procedures (including critical care time)  Medications Ordered in UC Medications - No data to display  Initial Impression / Assessment and Plan / UC Course  I have reviewed the triage vital signs and the nursing notes.  Pertinent labs & imaging results that were available during my care of the patient were reviewed by me and considered in my medical decision making (see chart for details).  Acute upper respiratory infection, COPD flare  Vital signs are stable, O2 saturation 96% on room air, lungs are clear to auscultation therefore imaging deferred at this time, symptoms have been present for 10 days and are progressing we will provide coverage, Augmentin prescribed as well as prednisone, advised to stop 5 mg tablets temporarily while using burst, all 3 inhalers sent to pharmacy and discussed administration, given strict precautions for worsening symptoms to go to the nearest emergency department and for persisting symptoms to follow-up with PCP or pulmonologist for reevaluation, may continue use of over-the-counter medications for coughing as they have been deemed effective Final Clinical Impressions(s) / UC Diagnoses   Final diagnoses:  None   Discharge Instructions   None    ED Prescriptions   None    PDMP not reviewed this encounter.   Hans Eden, NP 06/28/22 1143

## 2022-06-30 ENCOUNTER — Telehealth: Payer: Self-pay | Admitting: *Deleted

## 2022-06-30 NOTE — Telephone Encounter (Signed)
-----   Message from Margie Billet, [place] sent at 06/28/2022  2:58 PM EST ----- Please let patient know that his BNP (measurement of extra fluid in the body) was within normal limits. This suggests that his shortness of breath is not due to a heart failure exacerbation. He should make sure he is using his inhalers properly, like we discussed at his appointment.   Thanks! KJ

## 2022-06-30 NOTE — Telephone Encounter (Signed)
Left detail message of results  on secure cell voicemail, unable to leave message on home number - phone continue to rings. Any question may call back

## 2022-07-01 ENCOUNTER — Other Ambulatory Visit: Payer: Self-pay | Admitting: Nurse Practitioner

## 2022-07-01 DIAGNOSIS — E119 Type 2 diabetes mellitus without complications: Secondary | ICD-10-CM

## 2022-07-10 ENCOUNTER — Telehealth: Payer: Self-pay

## 2022-07-10 DIAGNOSIS — E782 Mixed hyperlipidemia: Secondary | ICD-10-CM

## 2022-07-10 DIAGNOSIS — E119 Type 2 diabetes mellitus without complications: Secondary | ICD-10-CM

## 2022-07-10 MED ORDER — LOVASTATIN 40 MG PO TABS: 40.0000 mg | ORAL_TABLET | Freq: Every day | ORAL | 3 refills | Status: DC

## 2022-07-10 NOTE — Telephone Encounter (Signed)
Refill sent.

## 2022-07-10 NOTE — Telephone Encounter (Signed)
Pt is requesting a refill for: lovastatin (MEVACOR) 40 MG tablet   Please send a replace/new response with 100-Day Supply if appropriate to maximize member benefit. Requesting 1 year supply.   Pharmacy: [facility]) - [address] [place] 02/24/22 ROV 07/30/22

## 2022-07-14 ENCOUNTER — Encounter: Payer: Self-pay | Admitting: Family Medicine

## 2022-07-14 ENCOUNTER — Other Ambulatory Visit: Payer: Self-pay | Admitting: Family Medicine

## 2022-07-14 DIAGNOSIS — I2510 Atherosclerotic heart disease of native coronary artery without angina pectoris: Secondary | ICD-10-CM | POA: Insufficient documentation

## 2022-07-14 DIAGNOSIS — I5022 Chronic systolic (congestive) heart failure: Secondary | ICD-10-CM

## 2022-07-14 NOTE — Assessment & Plan Note (Signed)
Per cardiology 05/21/22: Nonobstructive CAD.  LHC December 2023 showed nonobstructive disease in RCA, LAD and LCx.  Patient denies chest pain, pressure or tightness. He is able to care for his wife who has MS and is a quadriplegic without chest pain or shortness of breath. He uses a hoyer lift for transfers and assists her with hygiene and all other ADLs.  Continue aspirin, lovastatin, metoprolol.

## 2022-07-14 NOTE — Assessment & Plan Note (Signed)
Per cardiology 05/21/22: Hypertension.  BP today 18/72. Patient denies dizziness or headache. Continue metoprolol and Entresto.

## 2022-07-14 NOTE — Assessment & Plan Note (Signed)
Per cardiology 0000000: Chronic systolic heart failure/NICM.  Echo December 2023 EF 40 to 45%.  Patient denies increased shortness of breath or DOE since discharge from hospital. He continues to have low energy. He denies lower extremity edema. On physical exam he has mild, nonpitting edema to bilateral ankles and trace pretibial edema. He has been wearing compression socks. Euvolemic and well compensated on exam. Breath sound clear to auscultation bilaterally. Will add [place]. Recheck BMP in two weeks. Continue spironolactone, Entresto, metoprolol.

## 2022-07-14 NOTE — Assessment & Plan Note (Signed)
>>  ASSESSMENT AND PLAN FOR HYPERTENSION ASSOCIATED WITH DIABETES ([place]) WRITTEN ON 07/14/2022 11:28 AM BY Vivek Grealish, Darel Hong, PA  Per cardiology 05/21/22: Hypertension.  BP today 18/72. Patient denies dizziness or headache. Continue metoprolol and Entresto.

## 2022-07-21 ENCOUNTER — Telehealth: Payer: Self-pay | Admitting: *Deleted

## 2022-07-21 DIAGNOSIS — E1159 Type 2 diabetes mellitus with other circulatory complications: Secondary | ICD-10-CM

## 2022-07-21 DIAGNOSIS — I152 Hypertension secondary to endocrine disorders: Secondary | ICD-10-CM

## 2022-07-21 MED ORDER — SPIRONOLACTONE 25 MG PO TABS: 12.5000 mg | ORAL_TABLET | Freq: Every day | ORAL | 0 refills | Status: DC

## 2022-07-21 NOTE — Telephone Encounter (Signed)
Refill sent.

## 2022-07-21 NOTE — Telephone Encounter (Signed)
Pt calling needing refill on below.  Last fill was from hospital and they do not do refills he said. He said he is out and would like enough sent in to last until appointment.     spironolactone (ALDACTONE) 25 MG tablet   [address] [place], [place] - [address]   LOV 02/24/22 ROV 07/30/2022

## 2022-07-27 ENCOUNTER — Telehealth: Payer: Self-pay

## 2022-07-27 DIAGNOSIS — E1159 Type 2 diabetes mellitus with other circulatory complications: Secondary | ICD-10-CM

## 2022-07-27 MED ORDER — SACUBITRIL-VALSARTAN 24-26 MG PO TABS: 1.0000 | ORAL_TABLET | Freq: Two times a day (BID) | ORAL | 0 refills | Status: DC

## 2022-07-27 MED ORDER — METOPROLOL SUCCINATE ER 25 MG PO TB24: 25.0000 mg | ORAL_TABLET | Freq: Every day | ORAL | 0 refills | Status: DC

## 2022-07-27 NOTE — Telephone Encounter (Signed)
Refills sent to [facility] per patient request

## 2022-07-27 NOTE — Telephone Encounter (Signed)
Patient called office to get refill on his METOPROLO, AND ENTRESTO, medications, please advise, thanks.

## 2022-07-30 ENCOUNTER — Encounter: Payer: Self-pay | Admitting: Family Medicine

## 2022-07-30 ENCOUNTER — Ambulatory Visit (INDEPENDENT_AMBULATORY_CARE_PROVIDER_SITE_OTHER): Payer: Medicare Other | Admitting: Family Medicine

## 2022-07-30 VITALS — BP 120/75 | HR 67 | Resp 18 | Ht 69.0 in | Wt 184.0 lb

## 2022-07-30 DIAGNOSIS — J441 Chronic obstructive pulmonary disease with (acute) exacerbation: Secondary | ICD-10-CM | POA: Diagnosis not present

## 2022-07-30 DIAGNOSIS — I5022 Chronic systolic (congestive) heart failure: Secondary | ICD-10-CM | POA: Diagnosis not present

## 2022-07-30 DIAGNOSIS — J449 Chronic obstructive pulmonary disease, unspecified: Secondary | ICD-10-CM

## 2022-07-30 DIAGNOSIS — E0822 Diabetes mellitus due to underlying condition with diabetic chronic kidney disease: Secondary | ICD-10-CM

## 2022-07-30 DIAGNOSIS — E1169 Type 2 diabetes mellitus with other specified complication: Secondary | ICD-10-CM

## 2022-07-30 DIAGNOSIS — I10 Essential (primary) hypertension: Secondary | ICD-10-CM | POA: Diagnosis not present

## 2022-07-30 DIAGNOSIS — E782 Mixed hyperlipidemia: Secondary | ICD-10-CM | POA: Diagnosis not present

## 2022-07-30 DIAGNOSIS — N1831 Chronic kidney disease, stage 3a: Secondary | ICD-10-CM | POA: Diagnosis not present

## 2022-07-30 DIAGNOSIS — I2510 Atherosclerotic heart disease of native coronary artery without angina pectoris: Secondary | ICD-10-CM | POA: Diagnosis not present

## 2022-07-30 LAB — POCT GLYCOSYLATED HEMOGLOBIN (HGB A1C): HbA1c POC (<> result, manual entry): 6.7 % (ref 4.0–5.6)

## 2022-07-30 MED ORDER — LEVALBUTEROL TARTRATE 45 MCG/ACT IN AERO: 1.0000 | INHALATION_SPRAY | RESPIRATORY_TRACT | 12 refills | Status: DC | PRN

## 2022-07-30 MED ORDER — QVAR REDIHALER 40 MCG/ACT IN AERB: 1.0000 | INHALATION_SPRAY | Freq: Two times a day (BID) | RESPIRATORY_TRACT | 12 refills | Status: AC

## 2022-07-30 MED ORDER — STIOLTO RESPIMAT 2.5-2.5 MCG/ACT IN AERS: 2.0000 | INHALATION_SPRAY | Freq: Every day | RESPIRATORY_TRACT | 3 refills | Status: AC

## 2022-07-30 NOTE — Patient Instructions (Signed)
Follow-up with your pulmonologist.  Continue using all 3 of your inhalers.  It was nice to meet you today!

## 2022-07-30 NOTE — Assessment & Plan Note (Signed)
A1c in office 6.7, check with home health was 5.8.  Suspect this may be secondary to recent URI and/or inhaled corticosteroid use.  Continue metformin 500 mg daily and Farxiga 10 mg daily.  Will recheck A1c in 3 months.  At that point, also need to perform foot exam.  Working on obtaining copies of most recent eye exam.

## 2022-07-30 NOTE — Assessment & Plan Note (Signed)
Followed by cardiology.  Continue aspirin, lovastatin, metoprolol.  Per cardiology 05/21/22: Nonobstructive CAD.  LHC December 2023 showed nonobstructive disease in RCA, LAD and LCx.  Patient denies chest pain, pressure or tightness. He is able to care for his wife who has MS and is a quadriplegic without chest pain or shortness of breath. He uses a hoyer lift for transfers and assists her with hygiene and all other ADLs.  Continue aspirin, lovastatin, metoprolol.

## 2022-07-30 NOTE — Assessment & Plan Note (Signed)
Followed by cardiology.  Continue Farxiga, spironolactone, Entresto, metoprolol.  Per cardiology 0000000: Chronic systolic heart failure/NICM.  Echo December 2023 EF 40 to 45%.  Patient denies increased shortness of breath or DOE since discharge from hospital. He continues to have low energy. He denies lower extremity edema. On physical exam he has mild, nonpitting edema to bilateral ankles and trace pretibial edema. He has been wearing compression socks. Euvolemic and well compensated on exam. Breath sound clear to auscultation bilaterally. Will add [place]. Recheck BMP in two weeks. Continue spironolactone, Entresto, metoprolol.

## 2022-07-30 NOTE — Progress Notes (Signed)
Established Patient Office Visit  Subjective   Patient ID: Alan Hurst, male    DOB: 1945/07/03  Age: 77 y.o. MRN: [id number]  Chief Complaint  Patient presents with   Hospitalization Follow-up   Diabetes    HPI Alan Hurst is a 77 y.o. male presenting today for follow up of hospitalization for COPD with acute exacerbation and NSTEMI in December 2023.  Patient stayed in the hospital for 6 night, underwent left heart catheterization while there.  On discharge, was placed on prednisone 5 mg daily, beclomethasone inhaler twice daily, Stiolto twice daily, levalbuterol as needed, and a course of azithromycin.  After catheterization, discharged home on Toprol 25 mg daily, Entresto 24/26 mg twice daily, Aldactone 12.5 mg daily.  He followed up with cardiology and they added Farxiga daily in addition to continuing spironolactone, Entresto, metoprolol, lovastatin, aspirin.  He presented to urgent care 06/28/2022 with continued congestion and suspected URI, at that time was told that he should have been using all 3 of his inhalers, he had only been using his levalbuterol.  He is now using all 3 and reports great improvement in his symptoms.  Denies shortness of breath, chest pain, leg edema, difficulty breathing.  ROS Negative unless otherwise noted in HPI   Objective:     BP 120/75 (BP Location: Left Arm, Patient Position: Sitting, Cuff Size: Normal)   Pulse 67   Resp 18   Ht 5\' 9"  (1.753 m)   Wt 184 lb (83.5 kg)   SpO2 95%   BMI 27.17 kg/m   Physical Exam Constitutional:      General: He is not in acute distress.    Appearance: Normal appearance. He is not ill-appearing.  HENT:     Head: Normocephalic and atraumatic.  Cardiovascular:     Rate and Rhythm: Normal rate and regular rhythm.     Pulses: Normal pulses.     Heart sounds: Normal heart sounds. No murmur heard.    No friction rub. No gallop.  Pulmonary:     Effort: Pulmonary effort is normal.     Breath sounds: Normal  breath sounds. No wheezing, rhonchi or rales.  Skin:    General: Skin is warm and dry.  Neurological:     Mental Status: He is alert and oriented to person, place, and time.  Psychiatric:        Mood and Affect: Mood normal.    Results for orders placed or performed in visit on 07/30/22  Hemoglobin A1c  Result Value Ref Range   Hemoglobin A1C 5.8   POCT HgB A1C  Result Value Ref Range   Hemoglobin A1C     HbA1c POC (<> result, manual entry) 6.7 4.0 - 5.6 %   HbA1c, POC (prediabetic range)     HbA1c, POC (controlled diabetic range)      Assessment & Plan:  COPD with acute exacerbation ([place]) -     Qvar RediHaler; Inhale 1 puff into the lungs 2 (two) times daily.  Dispense: 1 each; Refill: 12 -     Levalbuterol Tartrate; Inhale 1 puff into the lungs every 4 (four) hours as needed for wheezing.  Dispense: 1 each; Refill: 12 -     Stiolto Respimat; Inhale 2 each into the lungs daily.  Dispense: 4 g; Refill: 3  Chronic obstructive pulmonary disease, unspecified COPD type ([place]) Assessment & Plan: Patient in high risk group, will continue with LABA + LAMA (Stiolto) and ICS (Qvar) with levalbuterol rescue inhaler.  Patient is planning to call his pulmonologist to follow-up with them as it has been 5 to 6 months since his last visit.   Chronic systolic CHF (congestive heart failure), NYHA class 1 ([place]) Assessment & Plan: Followed by cardiology.  Continue Farxiga, spironolactone, Entresto, metoprolol.  Per cardiology 0000000: Chronic systolic heart failure/NICM.  Echo December 2023 EF 40 to 45%.  Patient denies increased shortness of breath or DOE since discharge from hospital. He continues to have low energy. He denies lower extremity edema. On physical exam he has mild, nonpitting edema to bilateral ankles and trace pretibial edema. He has been wearing compression socks. Euvolemic and well compensated on exam. Breath sound clear to auscultation bilaterally. Will add [place]. Recheck BMP in  two weeks. Continue spironolactone, Entresto, metoprolol.   Nonobstructive atherosclerosis of coronary artery Assessment & Plan: Followed by cardiology.  Continue aspirin, lovastatin, metoprolol.  Per cardiology 05/21/22: Nonobstructive CAD.  LHC December 2023 showed nonobstructive disease in RCA, LAD and LCx.  Patient denies chest pain, pressure or tightness. He is able to care for his wife who has MS and is a quadriplegic without chest pain or shortness of breath. He uses a hoyer lift for transfers and assists her with hygiene and all other ADLs.  Continue aspirin, lovastatin, metoprolol.   Essential hypertension Assessment & Plan: Also followed by cardiology.  Stable.  Continue metoprolol and Entresto.  Will repeat CMP at follow-up in 3 months.  Will continue to monitor.   Diabetes mellitus due to underlying condition with stage 3a chronic kidney disease, without long-term current use of insulin ([place]) Assessment & Plan: A1c in office 6.7, check with home health was 5.8.  Suspect this may be secondary to recent URI and/or inhaled corticosteroid use.  Continue metformin 500 mg daily and Farxiga 10 mg daily.  Will recheck A1c in 3 months.  At that point, also need to perform foot exam.  Working on obtaining copies of most recent eye exam.  Orders: -     POCT glycosylated hemoglobin (Hb A1C)  Mixed diabetic hyperlipidemia associated with type 2 diabetes mellitus ([place]) Assessment & Plan: Last lipid panel: LDL 63, at goal.  Continue lovastatin 40 mg daily.     Return in about 3 months (around 10/30/2022) for follow-up for diabetes, hyperlipidemia, hypertension, and GERD.   I spent 50 minutes on the day of the encounter to include pre-visit record review, face-to-face time with the patient and post visit ordering of test.  Alan Harman, PA

## 2022-07-30 NOTE — Assessment & Plan Note (Signed)
Last lipid panel: LDL 63, at goal.  Continue lovastatin 40 mg daily.

## 2022-07-30 NOTE — Assessment & Plan Note (Signed)
Also followed by cardiology.  Stable.  Continue metoprolol and Entresto.  Will repeat CMP at follow-up in 3 months.  Will continue to monitor.

## 2022-07-30 NOTE — Assessment & Plan Note (Signed)
Patient in high risk group, will continue with LABA + LAMA (Stiolto) and ICS (Qvar) with levalbuterol rescue inhaler.  Patient is planning to call his pulmonologist to follow-up with them as it has been 5 to 6 months since his last visit.

## 2022-08-03 DIAGNOSIS — C775 Secondary and unspecified malignant neoplasm of intrapelvic lymph nodes: Secondary | ICD-10-CM | POA: Diagnosis not present

## 2022-08-03 DIAGNOSIS — C3411 Malignant neoplasm of upper lobe, right bronchus or lung: Secondary | ICD-10-CM | POA: Diagnosis not present

## 2022-08-21 DIAGNOSIS — Z96643 Presence of artificial hip joint, bilateral: Secondary | ICD-10-CM | POA: Diagnosis not present

## 2022-08-21 DIAGNOSIS — C775 Secondary and unspecified malignant neoplasm of intrapelvic lymph nodes: Secondary | ICD-10-CM | POA: Diagnosis not present

## 2022-08-21 DIAGNOSIS — C3411 Malignant neoplasm of upper lobe, right bronchus or lung: Secondary | ICD-10-CM | POA: Diagnosis not present

## 2022-08-21 DIAGNOSIS — R918 Other nonspecific abnormal finding of lung field: Secondary | ICD-10-CM | POA: Diagnosis not present

## 2022-08-21 DIAGNOSIS — K4020 Bilateral inguinal hernia, without obstruction or gangrene, not specified as recurrent: Secondary | ICD-10-CM | POA: Diagnosis not present

## 2022-08-21 DIAGNOSIS — K3189 Other diseases of stomach and duodenum: Secondary | ICD-10-CM | POA: Diagnosis not present

## 2022-08-21 DIAGNOSIS — J432 Centrilobular emphysema: Secondary | ICD-10-CM | POA: Diagnosis not present

## 2022-08-21 DIAGNOSIS — J929 Pleural plaque without asbestos: Secondary | ICD-10-CM | POA: Diagnosis not present

## 2022-08-21 DIAGNOSIS — R911 Solitary pulmonary nodule: Secondary | ICD-10-CM | POA: Diagnosis not present

## 2022-08-25 ENCOUNTER — Ambulatory Visit (HOSPITAL_COMMUNITY): Payer: Medicare Other | Attending: Internal Medicine

## 2022-08-25 DIAGNOSIS — I5022 Chronic systolic (congestive) heart failure: Secondary | ICD-10-CM | POA: Diagnosis not present

## 2022-08-25 LAB — ECHOCARDIOGRAM COMPLETE
Area-P 1/2: 3.77 cm2
Calc EF: 66.4 %
S' Lateral: 2.5 cm
Single Plane A2C EF: 58.2 %
Single Plane A4C EF: 70.3 %

## 2022-08-25 MED ORDER — PERFLUTREN LIPID MICROSPHERE
1.0000 mL | INTRAVENOUS | Status: AC | PRN
  Administered 2022-08-25: 2 mL via INTRAVENOUS

## 2022-08-26 ENCOUNTER — Telehealth: Payer: Self-pay

## 2022-08-26 NOTE — Telephone Encounter (Signed)
Left voicemail for patient to return call to office. 

## 2022-08-26 NOTE — Telephone Encounter (Signed)
-----   Message from Jonita Albee, [place] sent at 08/26/2022 11:28 AM EDT ----- Please tell patient that after being on heart failure medications for 4 months, his ejection fraction (pumping function of the heart) has improved and is now 60-65%. This is within normal limits. The walls of the left ventricle are all moving correctly, and the right side of the heart is pumping normally. This is great news! He should continue his current medications and follow up as arranged.   Thanks [employer]

## 2022-08-28 ENCOUNTER — Other Ambulatory Visit: Payer: Self-pay | Admitting: Family Medicine

## 2022-08-28 DIAGNOSIS — E1159 Type 2 diabetes mellitus with other circulatory complications: Secondary | ICD-10-CM

## 2022-09-19 NOTE — Progress Notes (Unsigned)
Cardiology Clinic Note   Date: 09/21/2022 ID: DEMONTREY DAVYDOV, DOB 1945/10/04, MRN [id number]  Primary Cardiologist:  Little Ishikawa, MD  Patient Profile    Alan Hurst is a 77 y.o. male who presents to the clinic today for follow-up.  Past medical history significant for: Nonobstructive CAD. LHC 04/27/2022: Proximal RCA 30%, distal RCA 20%, RPA V 40%, ostial to proximal Cx 40%, ostial to proximal LAD 30%. Chronic systolic heart failure/NICM. Echo 08/25/2022: EF 60 to 65%.  Normal RV function.  Trivial MR.  LV function improved from prior echo December 2023 (EF 40 to 45%). Hypertension. Hyperlipidemia. Lipid panel 04/25/2022: LDL 63, HDL 60, TG 71, total 137. COPD. T2DM. CKD stage IIIb.   History of Present Illness    Alan Hurst was first evaluated by cardiology on 04/24/2022 during hospitalization for worsening shortness of breath/DOE.  Patient reported a 105-month history of worsening shortness of breath with sensation of chest burning with exertion particularly when he did more than his normal activity.  He attributed the symptoms to COPD so he would use his inhaler and rest until improved.  9 days prior to his admission he developed a productive cough and was evaluated at urgent care where he received steroids, inhaler, and cough syrup.  His symptoms continued to worsen and he was transported to the ER via EMS.  Initial labs: BNP 925, troponin x 4 522>> 506>> 300>> 179, potassium 3.1, magnesium normal, D-dimer 0.86.  COVID and flu negative.  Chest x-ray showed no acute process.  CTA was negative for PE and positive for small right pleural effusion.  Patient had recurrent chest pain morning after admission.  EKG did not show any ST-T wave changes.  No systolic dysfunction may represent demand ischemia in the setting of COPD exacerbation plan for catheterization once will patient recovered from respiratory issues.  LHC showed nonobstructive CAD.  GDMT started at discharge.    Patient was last seen in the office by Alan Leu, PA-C on 06/25/2022 with complaints of DOE. He noted he was only using one of his COPD inhalers.  BNP was normal.  He underwent repeat echo 4 months after starting GDMT for systolic heart failure which showed normalized LV function.  Today, patient is here alone.  He is doing well. Patient denies shortness of breath or dyspnea on exertion. No chest pain, pressure, or tightness. Denies lower extremity edema, orthopnea, or PND.  Daily weight at home has been stable.  No palpitations. BP low today.  He denies lightheadedness, dizziness, presyncope, or syncope.  He continues to take care of his wife who is a quadriplegic from MS and requires full care.  He does get limited help from hospice and friends.  He has applied for patient assistance for Marcelline Deist but has run into some roadblocks.  He is concerned he will not be approved and that the medication is over $100 a month.  He is provided with samples to try to get him through to the decision for his application.  If he is denied assistance we discussed discontinuing this medication.     ROS: All other systems reviewed and are otherwise negative except as noted in History of Present Illness.  Studies Reviewed    ECG is not ordered today.         Physical Exam    VS:  BP (!) 91/56 (BP Location: Left Arm, Patient Position: Sitting, Cuff Size: Normal)   Pulse 64   Ht 5' 9.5" (1.765 m)  Wt 183 lb 9.6 oz (83.3 kg)   SpO2 98%   BMI 26.72 kg/m  , BMI Body mass index is 26.72 kg/m.  GEN: Well nourished, well developed, in no acute distress. Neck: No JVD or carotid bruits. Cardiac:  RRR. No murmurs. No rubs or gallops.   Respiratory:  Respirations regular and unlabored. Clear to auscultation without rales, wheezing or rhonchi. GI: Soft, nontender, nondistended. Extremities: Radials/DP/PT 2+ and equal bilaterally. No clubbing or cyanosis. No edema   Skin: Warm and dry, no rash. Neuro:  Strength intact.  Assessment & Plan    Chronic systolic heart failure/NICM.  Echo April 2024 showed normalized LV function with EF 60 to 65%.  Patient denies lower extremity edema, shortness of breath, DOE, orthopnea, PND.  Euvolemic and well compensated on exam. Continue spironolactone, Entresto, metoprolol, Farxiga.  Assistance application pending for Farxiga.  He is provided with samples today to hopefully bridge decision making.  If he is denied assistance he is instructed we can discontinue medication.  He will contact the office. Nonobstructive CAD.  LHC December 2023 showed nonobstructive disease in RCA, LAD and LCx.  Patient denies chest pain, pressure or tightness. He is able to care for his wife who has MS and is a quadriplegic without chest pain or shortness of breath. He uses a hoyer lift for transfers and assists her with hygiene and all other ADLs.  He gets limited help from hospice and some local friends are willing to sit with her when he has appointments.  Continue aspirin, lovastatin, metoprolol. Hypertension.  BP today 91/56. Patient denies lightheadedness, dizziness, presyncope, syncope or headache. Continue metoprolol, spironolactone and Entresto.  Hyperlipidemia.  LDL December 2023 63, at goal.  Continue lovastatin.  Disposition: Return in 5 to 6 months or sooner as needed.         Signed, Alan Hurst. Alan Siek, DNP, NP-C

## 2022-09-21 ENCOUNTER — Encounter: Payer: Self-pay | Admitting: Student

## 2022-09-21 ENCOUNTER — Other Ambulatory Visit: Payer: Self-pay | Admitting: Student

## 2022-09-21 ENCOUNTER — Ambulatory Visit: Payer: Medicare Other | Attending: Internal Medicine | Admitting: Student

## 2022-09-21 VITALS — BP 91/56 | HR 64 | Ht 69.5 in | Wt 183.6 lb

## 2022-09-21 DIAGNOSIS — E785 Hyperlipidemia, unspecified: Secondary | ICD-10-CM | POA: Diagnosis not present

## 2022-09-21 DIAGNOSIS — I10 Essential (primary) hypertension: Secondary | ICD-10-CM

## 2022-09-21 DIAGNOSIS — I2510 Atherosclerotic heart disease of native coronary artery without angina pectoris: Secondary | ICD-10-CM

## 2022-09-21 DIAGNOSIS — I5022 Chronic systolic (congestive) heart failure: Secondary | ICD-10-CM | POA: Diagnosis not present

## 2022-09-21 MED ORDER — DAPAGLIFLOZIN PROPANEDIOL 10 MG PO TABS: 10.0000 mg | ORAL_TABLET | Freq: Every day | ORAL | 0 refills | Status: DC

## 2022-09-21 NOTE — Patient Instructions (Signed)
Medication Instructions:  Your physician recommends that you continue on your current medications as directed. Please refer to the Current Medication list given to you today.  Samples of Farxiga 10mg  were given to the patient, quantity 21 tabs (3 boxes) Lot Number [id number]   *If you need a refill on your cardiac medications before your next appointment, please call your pharmacy*   Lab Work: NONE If you have labs (blood work) drawn today and your tests are completely normal, you will receive your results only by: MyChart Message (if you have MyChart) OR A paper copy in the mail If you have any lab test that is abnormal or we need to change your treatment, we will call you to review the results.   Testing/Procedures: NONE   Follow-Up: At [facility], you and your health needs are our priority.  As part of our continuing mission to provide you with exceptional heart care, we have created designated Provider Care Teams.  These Care Teams include your primary Cardiologist (physician) and Advanced Practice Providers (APPs -  Physician Assistants and Nurse Practitioners) who all work together to provide you with the care you need, when you need it.  We recommend signing up for the patient portal called "MyChart".  Sign up information is provided on this After Visit Summary.  MyChart is used to connect with patients for Virtual Visits (Telemedicine).  Patients are able to view lab/test results, encounter notes, upcoming appointments, etc.  Non-urgent messages can be sent to your provider as well.   To learn more about what you can do with MyChart, go to [email or web address].    Your next appointment:   4-6 month(s)  Provider:   Little Ishikawa, MD

## 2022-09-24 ENCOUNTER — Telehealth: Payer: Self-pay

## 2022-09-24 NOTE — Telephone Encounter (Signed)
Unable to reach patient, But I did leave him a very detailed message in regards to his Patient Assistance being approved.  He has been approved from 09/24/2022-05/11/2023   Medication approval for : Marcelline Deist 10mg  daily.

## 2022-09-28 ENCOUNTER — Other Ambulatory Visit: Payer: Self-pay | Admitting: Family Medicine

## 2022-09-28 DIAGNOSIS — I152 Hypertension secondary to endocrine disorders: Secondary | ICD-10-CM

## 2022-10-29 ENCOUNTER — Other Ambulatory Visit: Payer: Self-pay | Admitting: Family Medicine

## 2022-10-29 DIAGNOSIS — I152 Hypertension secondary to endocrine disorders: Secondary | ICD-10-CM

## 2022-10-30 ENCOUNTER — Ambulatory Visit: Payer: Medicare Other | Admitting: Family Medicine

## 2022-10-30 DIAGNOSIS — C3411 Malignant neoplasm of upper lobe, right bronchus or lung: Secondary | ICD-10-CM | POA: Diagnosis not present

## 2022-10-30 DIAGNOSIS — J449 Chronic obstructive pulmonary disease, unspecified: Secondary | ICD-10-CM | POA: Diagnosis not present

## 2022-11-02 DIAGNOSIS — C775 Secondary and unspecified malignant neoplasm of intrapelvic lymph nodes: Secondary | ICD-10-CM | POA: Diagnosis not present

## 2022-11-05 ENCOUNTER — Encounter: Payer: Self-pay | Admitting: Family Medicine

## 2022-11-05 ENCOUNTER — Ambulatory Visit (INDEPENDENT_AMBULATORY_CARE_PROVIDER_SITE_OTHER): Payer: Medicare Other | Admitting: Family Medicine

## 2022-11-05 VITALS — BP 115/69 | Resp 18 | Ht 69.5 in | Wt 181.0 lb

## 2022-11-05 DIAGNOSIS — E0822 Diabetes mellitus due to underlying condition with diabetic chronic kidney disease: Secondary | ICD-10-CM | POA: Diagnosis not present

## 2022-11-05 DIAGNOSIS — Z7984 Long term (current) use of oral hypoglycemic drugs: Secondary | ICD-10-CM | POA: Diagnosis not present

## 2022-11-05 DIAGNOSIS — N1831 Chronic kidney disease, stage 3a: Secondary | ICD-10-CM | POA: Diagnosis not present

## 2022-11-05 DIAGNOSIS — E1169 Type 2 diabetes mellitus with other specified complication: Secondary | ICD-10-CM

## 2022-11-05 DIAGNOSIS — E782 Mixed hyperlipidemia: Secondary | ICD-10-CM

## 2022-11-05 DIAGNOSIS — I10 Essential (primary) hypertension: Secondary | ICD-10-CM

## 2022-11-05 DIAGNOSIS — K219 Gastro-esophageal reflux disease without esophagitis: Secondary | ICD-10-CM

## 2022-11-05 NOTE — Assessment & Plan Note (Signed)
Rechecking A1c today.  Continue metformin 500 mg daily and Farxiga 10 mg daily.  Foot exam performed today, patient is working to schedule his eye exam.  At next appointment, will collect urine sample for microalbumin.

## 2022-11-05 NOTE — Assessment & Plan Note (Signed)
-  Continue omeprazole 20 mg daily

## 2022-11-05 NOTE — Assessment & Plan Note (Signed)
Most recent CMP collected by oncology with [facility] on 11/02/2022, eGFR 48.

## 2022-11-05 NOTE — Assessment & Plan Note (Signed)
Followed by cardiology. BP goal <130/80, stable at goal.  Continue metoprolol 25 mg daily, Entresto 24-26 mg.  Will continue to monitor.

## 2022-11-05 NOTE — Progress Notes (Signed)
Established Patient Office Visit  Subjective   Patient ID: Alan Hurst, male    DOB: 1946-02-24  Age: 77 y.o. MRN: [id number]  Chief Complaint  Patient presents with   Diabetes   Hyperlipidemia   Hypertension   Gastroesophageal Reflux    HPI SIMS LADAY is a 77 y.o. male presenting today for follow up of hypertension, hyperlipidemia, diabetes, GERD. Hypertension: Patient here for follow-up of elevated blood pressure.   Pt denies chest pain, SOB, dizziness, edema, syncope, fatigue or heart palpitations. Taking Entresto, metoprolol succinate, spironolactone, reports excellent compliance with treatment. Denies side effects. Hyperlipidemia: tolerating lovastatin well with no myalgias or significant side effects. Currently consuming a low fat diet.  The ASCVD Risk score (Arnett DK, et al., 2019) failed to calculate for the following reasons:   The patient has a prior MI or stroke diagnosis Diabetes: denies hypoglycemic events, wounds or sores that are not healing well, increased thirst or urination. Denies vision problems, eye exam due, patient is aware.  Taking metformin, Marcelline Deist as prescribed without any side effects.  GERD: Patient currently taking omeprazole, is not having side effects. He reports excellent compliance with treatment. Denies heartburn, dysphagia, nausea/vomiting, diarrhea, constipation, abdominal pain, chest pain, cough, early satiety, hematochezia/melena.  ROS Negative unless otherwise noted in HPI   Objective:     BP 115/69 (BP Location: Left Arm, Patient Position: Sitting, Cuff Size: Normal)   Resp 18   Ht 5' 9.5" (1.765 m)   Wt 181 lb (82.1 kg)   SpO2 96%   BMI 26.35 kg/m   Physical Exam Constitutional:      General: He is not in acute distress.    Appearance: Normal appearance.  HENT:     Head: Normocephalic and atraumatic.  Cardiovascular:     Rate and Rhythm: Normal rate and regular rhythm.     Pulses: Normal pulses.     Heart sounds:  Normal heart sounds. No murmur heard.    No friction rub. No gallop.  Pulmonary:     Effort: Pulmonary effort is normal. No respiratory distress.     Breath sounds: Normal breath sounds. No wheezing or rhonchi.  Skin:    General: Skin is warm and dry.  Neurological:     Mental Status: He is alert and oriented to person, place, and time.  Psychiatric:        Mood and Affect: Mood normal.    Diabetic Foot Exam - Simple   Simple Foot Form Diabetic Foot exam was performed with the following findings: Yes 11/05/2022 10:44 AM  Visual Inspection No deformities, no ulcerations, no other skin breakdown bilaterally: Yes Sensation Testing Intact to touch and monofilament testing bilaterally: Yes Pulse Check Posterior Tibialis and Dorsalis pulse intact bilaterally: Yes Comments     Assessment & Plan:  Gastroesophageal reflux disease, unspecified whether esophagitis present Assessment & Plan: Continue omeprazole 20 mg daily.   Essential hypertension Assessment & Plan: Followed by cardiology. BP goal <130/80, stable at goal.  Continue metoprolol 25 mg daily, Entresto 24-26 mg.  Will continue to monitor.   Diabetes mellitus due to underlying condition with stage 3a chronic kidney disease, without long-term current use of insulin (HCC) Assessment & Plan: Rechecking A1c today.  Continue metformin 500 mg daily and Farxiga 10 mg daily.  Foot exam performed today, patient is working to schedule his eye exam.  At next appointment, will collect urine sample for microalbumin.  Orders: -     Hemoglobin A1c; Future  Mixed  diabetic hyperlipidemia associated with type 2 diabetes mellitus (HCC) Assessment & Plan: Last lipid panel: LDL 63 and at goal.  Repeating lipid panel today.  Will continue to monitor.  Continue lovastatin 40 mg daily unless lab results warrant change.  Orders: -     Lipid panel; Future  Stage 3a chronic kidney disease (HCC) Assessment & Plan: Most recent CMP collected by  oncology with [facility] on 11/02/2022, eGFR 48.     Return in about 3 months (around 02/05/2023) for follow-up for HTN, HLD, DM, GERD, fasting blood work 1 week before.    Melida Quitter, PA

## 2022-11-05 NOTE — Assessment & Plan Note (Signed)
Last lipid panel: LDL 63 and at goal.  Repeating lipid panel today.  Will continue to monitor.  Continue lovastatin 40 mg daily unless lab results warrant change.

## 2022-11-06 LAB — LIPID PANEL
Chol/HDL Ratio: 3 ratio (ref 0.0–5.0)
Cholesterol, Total: 177 mg/dL (ref 100–199)
HDL: 60 mg/dL (ref >39)
LDL Chol Calc (NIH): 89 mg/dL (ref 0–99)
Triglycerides: 167 mg/dL — ABNORMAL HIGH (ref 0–149)
VLDL Cholesterol Cal: 28 mg/dL (ref 5–40)

## 2022-11-06 LAB — HEMOGLOBIN A1C
Est. average glucose Bld gHb Est-mCnc: 137 mg/dL
Hgb A1c MFr Bld: 6.4 % — ABNORMAL HIGH (ref 4.8–5.6)

## 2022-11-19 DIAGNOSIS — C3411 Malignant neoplasm of upper lobe, right bronchus or lung: Secondary | ICD-10-CM | POA: Diagnosis not present

## 2022-12-15 ENCOUNTER — Other Ambulatory Visit: Payer: Self-pay

## 2022-12-15 DIAGNOSIS — K219 Gastro-esophageal reflux disease without esophagitis: Secondary | ICD-10-CM

## 2022-12-15 MED ORDER — OMEPRAZOLE 20 MG PO CPDR: 20.0000 mg | DELAYED_RELEASE_CAPSULE | Freq: Every day | ORAL | 3 refills | Status: DC

## 2022-12-27 ENCOUNTER — Other Ambulatory Visit: Payer: Self-pay | Admitting: Nurse Practitioner

## 2022-12-27 DIAGNOSIS — N183 Chronic kidney disease, stage 3 unspecified: Secondary | ICD-10-CM

## 2022-12-28 NOTE — Telephone Encounter (Signed)
No longer taking per cardiology

## 2023-01-01 ENCOUNTER — Other Ambulatory Visit: Payer: Self-pay | Admitting: Family Medicine

## 2023-01-01 DIAGNOSIS — E1159 Type 2 diabetes mellitus with other circulatory complications: Secondary | ICD-10-CM

## 2023-01-14 ENCOUNTER — Other Ambulatory Visit: Payer: Self-pay | Admitting: Nurse Practitioner

## 2023-01-14 DIAGNOSIS — N183 Chronic kidney disease, stage 3 unspecified: Secondary | ICD-10-CM

## 2023-01-14 NOTE — Telephone Encounter (Signed)
Lisinopril no longer on medication list

## 2023-01-18 DIAGNOSIS — C775 Secondary and unspecified malignant neoplasm of intrapelvic lymph nodes: Secondary | ICD-10-CM | POA: Diagnosis not present

## 2023-01-18 DIAGNOSIS — E860 Dehydration: Secondary | ICD-10-CM | POA: Diagnosis not present

## 2023-01-21 DIAGNOSIS — C775 Secondary and unspecified malignant neoplasm of intrapelvic lymph nodes: Secondary | ICD-10-CM | POA: Diagnosis not present

## 2023-01-21 DIAGNOSIS — R7989 Other specified abnormal findings of blood chemistry: Secondary | ICD-10-CM | POA: Diagnosis not present

## 2023-01-25 ENCOUNTER — Other Ambulatory Visit: Payer: Self-pay | Admitting: Family Medicine

## 2023-01-25 DIAGNOSIS — D649 Anemia, unspecified: Secondary | ICD-10-CM

## 2023-01-25 DIAGNOSIS — E782 Mixed hyperlipidemia: Secondary | ICD-10-CM

## 2023-01-25 DIAGNOSIS — E876 Hypokalemia: Secondary | ICD-10-CM

## 2023-01-25 DIAGNOSIS — I10 Essential (primary) hypertension: Secondary | ICD-10-CM

## 2023-01-25 NOTE — Addendum Note (Signed)
Addended by: Tonny Bollman on: 01/25/2023 02:05 PM   Modules accepted: Orders

## 2023-01-29 ENCOUNTER — Other Ambulatory Visit: Payer: Medicare Other

## 2023-01-29 ENCOUNTER — Other Ambulatory Visit: Payer: Self-pay | Admitting: Family Medicine

## 2023-01-29 DIAGNOSIS — E782 Mixed hyperlipidemia: Secondary | ICD-10-CM | POA: Diagnosis not present

## 2023-01-29 DIAGNOSIS — I10 Essential (primary) hypertension: Secondary | ICD-10-CM

## 2023-01-29 DIAGNOSIS — D649 Anemia, unspecified: Secondary | ICD-10-CM

## 2023-01-29 DIAGNOSIS — E876 Hypokalemia: Secondary | ICD-10-CM | POA: Diagnosis not present

## 2023-01-29 DIAGNOSIS — I152 Hypertension secondary to endocrine disorders: Secondary | ICD-10-CM

## 2023-01-30 LAB — CBC WITH DIFFERENTIAL/PLATELET
Basophils Absolute: 0.1 10*3/uL (ref 0.0–0.2)
Basos: 1 %
EOS (ABSOLUTE): 0.3 10*3/uL (ref 0.0–0.4)
Eos: 5 %
Hematocrit: 36.2 % — ABNORMAL LOW (ref 37.5–51.0)
Hemoglobin: 11.3 g/dL — ABNORMAL LOW (ref 13.0–17.7)
Immature Grans (Abs): 0 10*3/uL (ref 0.0–0.1)
Immature Granulocytes: 0 %
Lymphocytes Absolute: 1.4 10*3/uL (ref 0.7–3.1)
Lymphs: 29 %
MCH: 27.7 pg (ref 26.6–33.0)
MCHC: 31.2 g/dL — ABNORMAL LOW (ref 31.5–35.7)
MCV: 89 fL (ref 79–97)
Monocytes Absolute: 0.4 10*3/uL (ref 0.1–0.9)
Monocytes: 9 %
Neutrophils Absolute: 2.8 10*3/uL (ref 1.4–7.0)
Neutrophils: 56 %
Platelets: 306 10*3/uL (ref 150–450)
RBC: 4.08 x10E6/uL — ABNORMAL LOW (ref 4.14–5.80)
RDW: 13.1 % (ref 11.6–15.4)
WBC: 5 10*3/uL (ref 3.4–10.8)

## 2023-01-30 LAB — COMPREHENSIVE METABOLIC PANEL
ALT: 12 IU/L (ref 0–44)
AST: 21 IU/L (ref 0–40)
Albumin: 4 g/dL (ref 3.8–4.8)
Alkaline Phosphatase: 82 IU/L (ref 44–121)
BUN/Creatinine Ratio: 14 (ref 10–24)
BUN: 20 mg/dL (ref 8–27)
Bilirubin Total: 0.3 mg/dL (ref 0.0–1.2)
CO2: 20 mmol/L (ref 20–29)
Calcium: 9.4 mg/dL (ref 8.6–10.2)
Chloride: 108 mmol/L — ABNORMAL HIGH (ref 96–106)
Creatinine, Ser: 1.43 mg/dL — ABNORMAL HIGH (ref 0.76–1.27)
Globulin, Total: 2 g/dL (ref 1.5–4.5)
Glucose: 93 mg/dL (ref 70–99)
Potassium: 4.7 mmol/L (ref 3.5–5.2)
Sodium: 142 mmol/L (ref 134–144)
Total Protein: 6 g/dL (ref 6.0–8.5)
eGFR: 51 mL/min/{1.73_m2} — ABNORMAL LOW (ref >59)

## 2023-01-30 LAB — LIPID PANEL
Chol/HDL Ratio: 2.7 ratio (ref 0.0–5.0)
Cholesterol, Total: 139 mg/dL (ref 100–199)
HDL: 52 mg/dL (ref >39)
LDL Chol Calc (NIH): 70 mg/dL (ref 0–99)
Triglycerides: 90 mg/dL (ref 0–149)
VLDL Cholesterol Cal: 17 mg/dL (ref 5–40)

## 2023-02-01 ENCOUNTER — Telehealth: Payer: Self-pay

## 2023-02-01 ENCOUNTER — Other Ambulatory Visit: Payer: Self-pay | Admitting: Family Medicine

## 2023-02-01 DIAGNOSIS — I152 Hypertension secondary to endocrine disorders: Secondary | ICD-10-CM

## 2023-02-01 NOTE — Telephone Encounter (Signed)
Refill sent.

## 2023-02-01 NOTE — Telephone Encounter (Signed)
Prescription Request  02/01/2023  LOV: 11/05/22  What is the name of the medication or equipment? ENTRESTO 24-26 MG   Have you contacted your pharmacy to request a refill? Yes   Which pharmacy would you like this sent to?   [facility] - [place], [place] - [address] [place] [address] Phone: [phone] Fax: [phone]  Patient notified that their request is being sent to the clinical staff for review and that they should receive a response within 2 business days.   Please advise at Mobile [phone] (mobile)

## 2023-02-05 ENCOUNTER — Ambulatory Visit (INDEPENDENT_AMBULATORY_CARE_PROVIDER_SITE_OTHER): Payer: Medicare Other | Admitting: Family Medicine

## 2023-02-05 ENCOUNTER — Encounter: Payer: Self-pay | Admitting: Family Medicine

## 2023-02-05 VITALS — BP 112/68 | HR 67 | Resp 20 | Ht 69.5 in | Wt 180.0 lb

## 2023-02-05 DIAGNOSIS — E1159 Type 2 diabetes mellitus with other circulatory complications: Secondary | ICD-10-CM

## 2023-02-05 DIAGNOSIS — I152 Hypertension secondary to endocrine disorders: Secondary | ICD-10-CM

## 2023-02-05 DIAGNOSIS — E1169 Type 2 diabetes mellitus with other specified complication: Secondary | ICD-10-CM

## 2023-02-05 DIAGNOSIS — C61 Malignant neoplasm of prostate: Secondary | ICD-10-CM

## 2023-02-05 DIAGNOSIS — C775 Secondary and unspecified malignant neoplasm of intrapelvic lymph nodes: Secondary | ICD-10-CM | POA: Diagnosis not present

## 2023-02-05 DIAGNOSIS — Z7984 Long term (current) use of oral hypoglycemic drugs: Secondary | ICD-10-CM | POA: Diagnosis not present

## 2023-02-05 DIAGNOSIS — Z1211 Encounter for screening for malignant neoplasm of colon: Secondary | ICD-10-CM

## 2023-02-05 DIAGNOSIS — N1831 Chronic kidney disease, stage 3a: Secondary | ICD-10-CM | POA: Diagnosis not present

## 2023-02-05 DIAGNOSIS — E782 Mixed hyperlipidemia: Secondary | ICD-10-CM

## 2023-02-05 NOTE — Assessment & Plan Note (Signed)
A1c 6.4. Continue metformin 500 mg daily and Farxiga 10 mg daily.  If A1c remains well at goal consider discontinuing metformin component.

## 2023-02-05 NOTE — Assessment & Plan Note (Signed)
Last lipid panel: LDL 70, HDL 52, triglycerides 90.  At goal.  Continue lovastatin 40 mg daily, will continue to monitor.

## 2023-02-05 NOTE — Patient Instructions (Addendum)
Continue to drink a TON of water every day!!!  Your creatinine level has improved since you are at your oncologist, but is not quite at normal range yet.  We will check it again when I see you in 3 months.  When we check your A1c again next time, if it remains at a good level we may be able to decrease or stop your metformin.

## 2023-02-05 NOTE — Progress Notes (Signed)
Established Patient Office Visit  Subjective   Patient ID: Alan Hurst, male    DOB: 1946-04-07  Age: 77 y.o. MRN: [id number]  Chief Complaint  Patient presents with   Diabetes   Hyperlipidemia   Hypertension   Gastroesophageal Reflux    HPI Alan Hurst is a 77 y.o. male presenting today for follow up of hypertension, hyperlipidemia, diabetes. Hypertension: Patient here for follow-up of elevated blood pressure.  Also followed by cardiology.  Pt denies chest pain, SOB, dizziness, edema, syncope, fatigue or heart palpitations. Taking spironolactone, metoprolol succinate, Entresto, reports excellent compliance with treatment. Denies side effects. Hyperlipidemia: tolerating lovastatin 40 mg daily well with no myalgias or significant side effects.  The ASCVD Risk score (Arnett DK, et al., 2019) failed to calculate for the following reasons:   The patient has a prior MI or stroke diagnosis Diabetes: denies hypoglycemic events, wounds or sores that are not healing well, increased thirst or urination. Denies vision problems, eye exam due and he is aware.  Taking metformin and Farxiga as prescribed without any side effects.  Outpatient Medications Prior to Visit  Medication Sig   abiraterone acetate (ZYTIGA) 250 MG tablet Take 1,000 mg by mouth daily.   aspirin EC 81 MG tablet Take 81 mg by mouth daily.   beclomethasone (QVAR REDIHALER) 40 MCG/ACT inhaler Inhale 1 puff into the lungs 2 (two) times daily.   Cholecalciferol (VITAMIN D) 2000 units tablet Take 1 tablet by mouth daily.   dapagliflozin propanediol (FARXIGA) 10 MG TABS tablet Take 1 tablet (10 mg total) by mouth daily before breakfast.   dapagliflozin propanediol (FARXIGA) 10 MG TABS tablet Take 1 tablet (10 mg total) by mouth daily before breakfast.   ENTRESTO 24-26 MG Take 1 tablet by mouth twice daily   fluticasone (FLONASE) 50 MCG/ACT nasal spray Place 1 spray into both nostrils daily. (Patient taking differently: Place 1  spray into both nostrils daily as needed for allergies.)   gabapentin (NEURONTIN) 100 MG capsule Take 300 mg by mouth at bedtime.   Krill Oil (OMEGA-3) 500 MG CAPS Take 1 capsule by mouth daily.   levalbuterol (XOPENEX HFA) 45 MCG/ACT inhaler Inhale 1 puff into the lungs every 4 (four) hours as needed for wheezing.   lovastatin (MEVACOR) 40 MG tablet Take 1 tablet (40 mg total) by mouth at bedtime.   magnesium oxide (MAG-OX) 400 MG tablet Take 400 mg by mouth daily.   melatonin 5 MG TABS Take 5 mg by mouth at bedtime.   metFORMIN (GLUCOPHAGE) 500 MG tablet TAKE 1 TABLET BY MOUTH DAILY   metoprolol succinate (TOPROL-XL) 25 MG 24 hr tablet Take 1 tablet by mouth once daily   Omega-3 Fatty Acids (FISH OIL) 500 MG CAPS Take 500 mg by mouth daily.   omeprazole (PRILOSEC) 20 MG capsule Take 1 capsule (20 mg total) by mouth daily.   spironolactone (ALDACTONE) 25 MG tablet Take 1/2 (one-half) tablet by mouth once daily   vitamin B-12 (CYANOCOBALAMIN) 500 MCG tablet Take 500 mcg by mouth daily.   No facility-administered medications prior to visit.    ROS Negative unless otherwise noted in HPI   Objective:     BP 112/68 (BP Location: Left Arm, Patient Position: Sitting, Cuff Size: Normal)   Pulse 67   Resp 20   Ht 5' 9.5" (1.765 m)   Wt 180 lb (81.6 kg)   SpO2 91%   BMI 26.20 kg/m   Physical Exam Constitutional:      General:  He is not in acute distress.    Appearance: Normal appearance.  HENT:     Head: Normocephalic and atraumatic.  Cardiovascular:     Rate and Rhythm: Normal rate and regular rhythm.     Heart sounds: Normal heart sounds. No murmur heard.    No friction rub. No gallop.  Pulmonary:     Effort: Pulmonary effort is normal. No respiratory distress.     Breath sounds: Normal breath sounds. No wheezing, rhonchi or rales.  Skin:    General: Skin is warm and dry.  Neurological:     Mental Status: He is alert and oriented to person, place, and time.  Psychiatric:         Mood and Affect: Mood normal.    No results found for any visits on 02/05/23.   Assessment & Plan:  Hypertension associated with diabetes [facility]) Assessment & Plan: Followed by cardiology. BP goal <130/80, stable at goal.  Continue metoprolol 25 mg daily, Entresto 24-26 mg.  Will continue to monitor.   Mixed diabetic hyperlipidemia associated with type 2 diabetes mellitus (HCC) Assessment & Plan: Last lipid panel: LDL 70, HDL 52, triglycerides 90.  At goal.  Continue lovastatin 40 mg daily, will continue to monitor.   Diabetes mellitus due to underlying condition with stage 3a chronic kidney disease, without long-term current use of insulin (HCC) Assessment & Plan: A1c 6.4. Continue metformin 500 mg daily and Farxiga 10 mg daily.  If A1c remains well at goal consider discontinuing metformin component.  Orders: -     POCT UA - Microalbumin  Prostate cancer metastatic to intrapelvic lymph node [facility]) Assessment & Plan: Followed by oncology annually, most recent PSA 0.03.  They also complete routine lab work, at last appointment on 01/18/2023 creatinine was elevated at 2.20 and he received IV fluids in the office.  Creatinine has improved since then and is now 1.43, encouraged to continue with oral hydration.  Continue Lupron and Zytiga/prednisone as prescribed by oncology.   Stage 3a chronic kidney disease (HCC) Assessment & Plan: Creatinine elevated at 1.43, EGFR low at 51, chloride slightly elevated 108, hemoglobin low but at baseline.  Emphasized the importance of continuing with good blood pressure and blood sugar control.  Will continue to monitor kidney function, repeat CMP at next appointment.  Continue Farxiga 10 mg daily.   Screening for colorectal cancer -     Cologuard    Return in about 3 months (around 05/07/2023) for follow-up for HTN, DM, nonfasting labs 1 week prior.    Melida Quitter, PA

## 2023-02-05 NOTE — Assessment & Plan Note (Signed)
Creatinine elevated at 1.43, EGFR low at 51, chloride slightly elevated 108, hemoglobin low but at baseline.  Emphasized the importance of continuing with good blood pressure and blood sugar control.  Will continue to monitor kidney function, repeat CMP at next appointment.  Continue Farxiga 10 mg daily.

## 2023-02-05 NOTE — Assessment & Plan Note (Signed)
Followed by cardiology. BP goal <130/80, stable at goal.  Continue metoprolol 25 mg daily, Entresto 24-26 mg.  Will continue to monitor.

## 2023-02-05 NOTE — Assessment & Plan Note (Signed)
Followed by oncology annually, most recent PSA 0.03.  They also complete routine lab work, at last appointment on 01/18/2023 creatinine was elevated at 2.20 and he received IV fluids in the office.  Creatinine has improved since then and is now 1.43, encouraged to continue with oral hydration.  Continue Lupron and Zytiga/prednisone as prescribed by oncology.

## 2023-02-09 ENCOUNTER — Telehealth: Payer: Self-pay

## 2023-02-09 NOTE — Telephone Encounter (Signed)
Contacted patient X2 stated unable to complete visit each time. Request call back to reschedule.

## 2023-02-09 NOTE — Patient Instructions (Signed)
Alan Hurst , Thank you for taking time to come for your Medicare Wellness Visit. I appreciate your ongoing commitment to your health goals. Please review the following plan we discussed and let me know if I can assist you in the future.   Referrals/Orders/Follow-Ups/Clinician Recommendations:   This is a list of the screening recommended for you and due dates:  Health Maintenance  Topic Date Due   Zoster (Shingles) Vaccine (1 of 2) Never done   Eye exam for diabetics  01/07/2022   Colon Cancer Screening  06/19/2022   Yearly kidney health urinalysis for diabetes  11/21/2022   Medicare Annual Wellness Visit  11/21/2022   COVID-19 Vaccine (4 - 2023-24 season) 02/21/2023*   Flu Shot  08/09/2023*   Hepatitis C Screening  10/26/2028*   Hemoglobin A1C  05/07/2023   Complete foot exam   11/05/2023   Yearly kidney function blood test for diabetes  01/29/2024   DTaP/Tdap/Td vaccine (2 - Td or Tdap) 03/26/2027   Pneumonia Vaccine  Completed   HPV Vaccine  Aged Out  *Topic was postponed. The date shown is not the original due date.    Advanced directives:   Next Medicare Annual Wellness Visit scheduled for next year: Yes

## 2023-02-09 NOTE — Progress Notes (Signed)
Subjective:   Alan Hurst is a 77 y.o. male who presents for Medicare Annual/Subsequent preventive examination.  Visit Complete:   Patient Medicare AWV questionnaire was completed by the patient on ; I have confirmed that all information answered by patient is correct and no changes since this date.        Objective:    There were no vitals filed for this visit. There is no height or weight on file to calculate BMI.     04/23/2022    3:31 PM 10/26/2016    9:33 AM 08/05/2016    4:00 AM 08/04/2016   10:57 PM 08/04/2016    1:15 PM 12/05/2015    1:30 PM 06/28/2015    1:35 PM  Advanced Directives  Does Patient Have a Medical Advance Directive? Yes No No No No Yes No  Type of Estate agent of [place];Living will     Living will;Healthcare Power of Attorney   Does patient want to make changes to medical advance directive? No - Patient declined        Copy of Healthcare Power of Attorney in Chart? No - copy requested        Would patient like information on creating a medical advance directive?  No - Patient declined No - Patient declined No - Patient declined       Current Medications (verified) Outpatient Encounter Medications as of 02/09/2023  Medication Sig   abiraterone acetate (ZYTIGA) 250 MG tablet Take 1,000 mg by mouth daily.   aspirin EC 81 MG tablet Take 81 mg by mouth daily.   beclomethasone (QVAR REDIHALER) 40 MCG/ACT inhaler Inhale 1 puff into the lungs 2 (two) times daily.   Cholecalciferol (VITAMIN D) 2000 units tablet Take 1 tablet by mouth daily.   dapagliflozin propanediol (FARXIGA) 10 MG TABS tablet Take 1 tablet (10 mg total) by mouth daily before breakfast.   dapagliflozin propanediol (FARXIGA) 10 MG TABS tablet Take 1 tablet (10 mg total) by mouth daily before breakfast.   ENTRESTO 24-26 MG Take 1 tablet by mouth twice daily   fluticasone (FLONASE) 50 MCG/ACT nasal spray Place 1 spray into both nostrils daily. (Patient taking  differently: Place 1 spray into both nostrils daily as needed for allergies.)   gabapentin (NEURONTIN) 100 MG capsule Take 300 mg by mouth at bedtime.   Krill Oil (OMEGA-3) 500 MG CAPS Take 1 capsule by mouth daily.   levalbuterol (XOPENEX HFA) 45 MCG/ACT inhaler Inhale 1 puff into the lungs every 4 (four) hours as needed for wheezing.   lovastatin (MEVACOR) 40 MG tablet Take 1 tablet (40 mg total) by mouth at bedtime.   magnesium oxide (MAG-OX) 400 MG tablet Take 400 mg by mouth daily.   melatonin 5 MG TABS Take 5 mg by mouth at bedtime.   metFORMIN (GLUCOPHAGE) 500 MG tablet TAKE 1 TABLET BY MOUTH DAILY   metoprolol succinate (TOPROL-XL) 25 MG 24 hr tablet Take 1 tablet by mouth once daily   Omega-3 Fatty Acids (FISH OIL) 500 MG CAPS Take 500 mg by mouth daily.   omeprazole (PRILOSEC) 20 MG capsule Take 1 capsule (20 mg total) by mouth daily.   spironolactone (ALDACTONE) 25 MG tablet Take 1/2 (one-half) tablet by mouth once daily   vitamin B-12 (CYANOCOBALAMIN) 500 MCG tablet Take 500 mcg by mouth daily.   No facility-administered encounter medications on file as of 02/09/2023.    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   [facility]   Recovering alcoholic in remission (HCC) 12/05/2015    No alcohol in many, many years     Sleep disorder 12/05/2015   Vitamin B deficiency 12/05/2015   Vitamin D insufficiency 12/05/2015   Past Surgical History:  Procedure Laterality Date   CATARACT EXTRACTION, BILATERAL  10/2016   COLONOSCOPY     FEMUR FRACTURE SURGERY     HIP FRACTURE SURGERY     JOINT REPLACEMENT Bilateral    hips   LEFT HEART CATH AND CORONARY ANGIOGRAPHY N/A 04/27/2022   Procedure: LEFT HEART CATH AND CORONARY ANGIOGRAPHY;  Surgeon: Kathleene Hazel, MD;  Location: [facility];  Service: Cardiovascular;  Laterality: N/A;   TIBIA FRACTURE SURGERY     TOTAL HIP REVISION Right 08/07/2016   Procedure: RIGHT TOTAL HIP REVISION;  Surgeon: Samson Frederic, MD;  Location: [facility];  Service: Orthopedics;  Laterality: Right;   Family History  Problem Relation Age of Onset   Alcohol abuse Mother    Alcohol abuse Father    Congestive Heart Failure Father    Kidney disease Father    Colon cancer Neg Hx    Esophageal cancer Neg Hx    Rectal cancer Neg Hx    Stomach cancer Neg Hx    Social History   Socioeconomic History   Marital status: Married    Spouse name: Alan Hurst   Number of children: 0   Years of education: Not on file   Highest education level: Not on file  Occupational History   Occupation: semi retired  Tobacco Use   Smoking status: Former    Current packs/day: 0.00    Average packs/day: 1.5 packs/day for 32.0 years (48.0 ttl pk-yrs)    Types: Cigarettes    Start date: 05/11/1961    Quit date: 05/11/1993    Years since quitting: 29.7    Passive exposure: Never   Smokeless tobacco: Never  Vaping Use   Vaping status: Never Used  Substance and Sexual Activity   Alcohol use: Not Currently   Drug use: No   Sexual activity: Not Currently    Birth control/protection: None  Other Topics Concern   Not on file  Social History Narrative   Not on file   Social Determinants of Health   Financial Resource Strain: Medium Risk (11/20/2021)    Overall Financial Resource Strain (CARDIA)    Difficulty of Paying Living Expenses: Somewhat hard  Food Insecurity: No Food Insecurity (04/23/2022)   Hunger Vital Sign    Worried About Running Out of Food in the Last Year: Never true    Ran Out of Food in the Last Year: Never true  Transportation Needs: No Transportation Needs (04/29/2022)   PRAPARE - Transportation  Lack of Transportation (Medical): No    Lack of Transportation (Non-Medical): No  Physical Activity: Insufficiently Active (11/20/2021)   Exercise Vital Sign    Days of Exercise per Week: 2 days    Minutes of Exercise per Session: 20 min  Stress: Stress Concern Present (11/20/2021)   [employer] of Occupational Health - Occupational Stress Questionnaire    Feeling of Stress : To some extent  Social Connections: Moderately Integrated (11/20/2021)   Social Connection and Isolation Panel [NHANES]    Frequency of Communication with Friends and Family: More than three times a week    Frequency of Social Gatherings with Friends and Family: Twice a week    Attends Religious Services: Never    Database administrator or Organizations: Yes    Attends Engineer, structural: More than 4 times per year    Marital Status: Married    Tobacco Counseling Counseling given: Not Answered   Clinical Intake:                        Activities of Daily Living    04/23/2022    3:31 PM  In your present state of health, do you have any difficulty performing the following activities:  Hearing? 0  Vision? 0  Difficulty concentrating or making decisions? 0  Walking or climbing stairs? 1  Comment SOB  Dressing or bathing? 0  Doing errands, shopping? 0    Patient Care Team: Melida Quitter, PA as PCP - General (Family Medicine) Little Ishikawa, MD as PCP - Cardiology (Cardiology) Tobias Alexander, OD as Referring Physician (Optometry) Janalyn Harder, MD (Inactive) as Consulting Physician  (Dermatology)  Indicate any recent Medical Services you may have received from other than [facility] providers in the past year (date may be approximate).     Assessment:   This is a routine wellness examination for Johnny.  Hearing/Vision screen No results found.   Goals Addressed   None    Depression Screen    02/05/2023   11:03 AM 02/24/2022    3:23 PM 11/20/2021    9:36 AM 07/01/2021    8:44 AM 02/24/2021    9:46 AM 10/24/2020   10:15 AM 06/24/2020   10:40 AM  PHQ 2/9 Scores  PHQ - 2 Score 0 0 0 0 1 1 0  PHQ- 9 Score 3 0 1 2 3 2 1     Fall Risk    02/24/2022    3:23 PM 11/20/2021    9:36 AM 07/01/2021    8:43 AM 02/24/2021    9:45 AM 10/24/2020   10:12 AM  Fall Risk   Falls in the past year? 0 0 0 0 0  Number falls in past yr: 0 0 0 0 0  Injury with Fall? 0 0 0 0 0  Risk for fall due to : No Fall Risks No Fall Risks No Fall Risks No Fall Risks No Fall Risks  Follow up Falls evaluation completed Falls evaluation completed Falls evaluation completed Falls evaluation completed Falls evaluation completed    MEDICARE RISK AT HOME:    TIMED UP AND GO:  Was the test performed?      Cognitive Function:        11/20/2021    9:43 AM 10/24/2020   10:00 AM 05/31/2019    8:10 AM 03/17/2018   11:17 AM  6CIT Screen  What Year? 0 points 0 points 0 points 0 points  What month? 0  points 0 points 0 points 0 points  What time? 0 points 0 points 0 points 0 points  Count back from 20 0 points 0 points 0 points 0 points  Months in reverse 0 points 0 points 0 points 0 points  Repeat phrase 0 points 0 points 0 points 0 points  Total Score 0 points 0 points 0 points 0 points    Immunizations Immunization History  Administered Date(s) Administered   Fluad Quad(high Dose 65+) 02/16/2019, 02/24/2021, 04/25/2022   Influenza Split 05/20/2012   Influenza, High Dose Seasonal PF 03/31/2013, 05/10/2015, 03/02/2016, 03/01/2017, 02/15/2018   Influenza-Unspecified 05/20/2012   PFIZER(Purple  Top)SARS-COV-2 Vaccination 06/16/2019, 07/07/2019, 04/26/2020   Pneumococcal Conjugate-13 05/10/2015, 03/02/2016   Pneumococcal Polysaccharide-23 03/31/2013   Tdap 03/25/2017            Qualifies for Shingles Vaccine?   Zostavax completed     Screening Tests Health Maintenance  Topic Date Due   Zoster Vaccines- Shingrix (1 of 2) Never done   OPHTHALMOLOGY EXAM  01/07/2022   Colonoscopy  06/19/2022   Diabetic kidney evaluation - Urine ACR  11/21/2022   Medicare Annual Wellness (AWV)  11/21/2022   COVID-19 Vaccine (4 - 2023-24 season) 02/21/2023 (Originally 01/10/2023)   INFLUENZA VACCINE  08/09/2023 (Originally 12/10/2022)   Hepatitis C Screening  10/26/2028 (Originally 02/15/1964)   HEMOGLOBIN A1C  05/07/2023   FOOT EXAM  11/05/2023   Diabetic kidney evaluation - eGFR measurement  01/29/2024   DTaP/Tdap/Td (2 - Td or Tdap) 03/26/2027   Pneumonia Vaccine 58+ Years old  Completed   HPV VACCINES  Aged Out    Health Maintenance  Health Maintenance Due  Topic Date Due   Zoster Vaccines- Shingrix (1 of 2) Never done   OPHTHALMOLOGY EXAM  01/07/2022   Colonoscopy  06/19/2022   Diabetic kidney evaluation - Urine ACR  11/21/2022   Medicare Annual Wellness (AWV)  11/21/2022      Lung Cancer Screening: (Low Dose CT Chest recommended if Age 66-80 years, 20 pack-year currently smoking OR have quit w/in 15years.)  qualify.   Lung Cancer Screening Referral:   Additional Screening:  Hepatitis C Screening:  qualify; Completed   Vision Screening: Recommended annual ophthalmology exams for early detection of glaucoma and other disorders of the eye. Is the patient up to date with their annual eye exam?   Who is the provider or what is the name of the office in which the patient attends annual eye exams?  If pt is not established with a provider, would they like to be referred to a provider to establish care? .   Dental Screening: Recommended annual dental exams for proper oral  hygiene  Diabetic Foot Exam:   Community Resource Referral / Chronic Care Management: CRR required this visit?    CCM required this visit?       Plan:     I have personally reviewed and noted the following in the patient's chart:   Medical and social history Use of alcohol, tobacco or illicit drugs  Current medications and supplements including opioid prescriptions.  Functional ability and status Nutritional status Physical activity Advanced directives List of other physicians Hospitalizations, surgeries, and ER visits in previous 12 months Vitals Screenings to include cognitive, depression, and falls Referrals and appointments  In addition, I have reviewed and discussed with patient certain preventive protocols, quality metrics, and best practice recommendations. A written personalized care plan for preventive services as well as general preventive health recommendations were provided to patient.  Tillie Rung, LPN   62/05/[id number]   After Visit Summary:   Nurse Notes: Patient contacted x2 stated unable to complete visit today.    This encounter was created in error - please disregard.

## 2023-02-10 NOTE — Progress Notes (Unsigned)
Cardiology Office Note:    Date:  02/11/2023   ID:  Alan Hurst, DOB 1945/06/13, MRN [id number]  PCP:  Melida Quitter, PA  Cardiologist:  Little Ishikawa, MD  Electrophysiologist:  None   Referring MD: Melida Quitter, PA   Chief Complaint  Patient presents with   Congestive Heart Failure    History of Present Illness:    Alan Hurst is a 77 y.o. male with a hx of CAD, chronic systolic heart failure, hypertension, hyperlipidemia, COPD, T2DM, CKD stage IIIb who presents for follow-up.  He was admitted 04/2022 with shortness of breath and chest pain and shortness of breath.  Troponin elevated to 500.  Echocardiogram showed EF 40 to 45%.  LHC showed nonobstructive CAD.  Troponin elevation felt to be demand ischemia in setting of COPD.  Echocardiogram 08/2022 showed EF 60 to 65%, normal RV function, no significant valvular disease.  Since last clinic visit, he reports he is doing well.  Denies any chest pain, dyspnea, lower extremity edema, or palpitations.  Reports some lightheadedness but denies any syncope.  Main complaint is cough. Takes care of wife, who is disabled.  Does not exercise outside of this.    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   [facility]   Recovering alcoholic in remission (HCC) 12/05/2015   No alcohol in many,  many years     Sleep disorder 12/05/2015   Vitamin B deficiency 12/05/2015   Vitamin D insufficiency 12/05/2015    Past Surgical History:  Procedure Laterality Date   CATARACT EXTRACTION, BILATERAL  10/2016   COLONOSCOPY     FEMUR FRACTURE SURGERY     HIP FRACTURE SURGERY     JOINT REPLACEMENT Bilateral    hips   LEFT HEART CATH AND CORONARY ANGIOGRAPHY N/A 04/27/2022   Procedure: LEFT HEART CATH AND CORONARY ANGIOGRAPHY;  Surgeon: Kathleene Hazel, MD;  Location: [facility];  Service: Cardiovascular;  Laterality: N/A;   TIBIA FRACTURE SURGERY     TOTAL HIP REVISION Right 08/07/2016   Procedure: RIGHT TOTAL HIP REVISION;  Surgeon: Samson Frederic, MD;  Location: [facility];  Service: Orthopedics;  Laterality: Right;    Current Medications: Current Meds  Medication Sig   abiraterone acetate (ZYTIGA) 250 MG tablet Take 1,000 mg by mouth daily.   aspirin EC 81 MG tablet Take 81 mg by mouth daily.   beclomethasone (QVAR REDIHALER) 40 MCG/ACT inhaler Inhale 1 puff into the lungs 2 (two) times daily.   Cholecalciferol (VITAMIN D) 2000 units tablet Take 1 tablet by mouth daily.   dapagliflozin propanediol (FARXIGA) 10 MG TABS tablet Take 1 tablet (10 mg total) by mouth daily before breakfast.   ENTRESTO 24-26 MG Take 1 tablet by mouth twice daily   Boris Lown  Oil (OMEGA-3) 500 MG CAPS Take 1 capsule by mouth daily.   lovastatin (MEVACOR) 40 MG tablet Take 1 tablet (40 mg total) by mouth at bedtime.   magnesium oxide (MAG-OX) 400 MG tablet Take 400 mg by mouth daily.   melatonin 5 MG TABS Take 5 mg by mouth at bedtime.   metFORMIN (GLUCOPHAGE) 500 MG tablet TAKE 1 TABLET BY MOUTH DAILY   metoprolol succinate (TOPROL-XL) 25 MG 24 hr tablet Take 1 tablet by mouth once daily   omeprazole (PRILOSEC) 20 MG capsule Take 1 capsule (20 mg total) by mouth daily.   spironolactone (ALDACTONE) 25 MG tablet Take 1/2 (one-half) tablet by mouth once daily   vitamin B-12 (CYANOCOBALAMIN) 500 MCG  tablet Take 500 mcg by mouth daily.     Allergies:   No known allergies and Flavoring agent   Social History   Socioeconomic History   Marital status: Married    Spouse name: Alan Hurst   Number of children: 0   Years of education: Not on file   Highest education level: Not on file  Occupational History   Occupation: semi retired  Tobacco Use   Smoking status: Former    Current packs/day: 0.00    Average packs/day: 1.5 packs/day for 32.0 years (48.0 ttl pk-yrs)    Types: Cigarettes    Start date: 05/11/1961    Quit date: 05/11/1993    Years since quitting: 29.7    Passive exposure: Never   Smokeless tobacco: Never  Vaping Use   Vaping status: Never Used  Substance and Sexual Activity   Alcohol use: Not Currently   Drug use: No   Sexual activity: Not Currently    Birth control/protection: None  Other Topics Concern   Not on file  Social History Narrative   Not on file   Social Determinants of Health   Financial Resource Strain: Medium Risk (11/20/2021)   Overall Financial Resource Strain (CARDIA)    Difficulty of Paying Living Expenses: Somewhat hard  Food Insecurity: No Food Insecurity (04/23/2022)   Hunger Vital Sign    Worried About Running Out of Food in the Last Year: Never true    Ran Out of Food in the Last Year: Never true  Transportation Needs: No Transportation Needs (04/29/2022)   PRAPARE - Administrator, Civil Service (Medical): No    Lack of Transportation (Non-Medical): No  Physical Activity: Insufficiently Active (11/20/2021)   Exercise Vital Sign    Days of Exercise per Week: 2 days    Minutes of Exercise per Session: 20 min  Stress: Stress Concern Present (11/20/2021)   [employer] of Occupational Health - Occupational Stress Questionnaire    Feeling of Stress : To some extent  Social Connections: Moderately Integrated (11/20/2021)   Social Connection and Isolation Panel [NHANES]    Frequency of Communication with Friends and Family:  More than three times a week    Frequency of Social Gatherings with Friends and Family: Twice a week    Attends Religious Services: Never    Database administrator or Organizations: Yes    Attends Engineer, structural: More than 4 times per year    Marital Status: Married     Family History: The patient's family history includes Alcohol abuse in his father and mother; Congestive Heart Failure in his father; Kidney disease in his father. There is no history of Colon cancer, Esophageal cancer, Rectal cancer, or Stomach cancer.  ROS:   Please see the history  of present illness.     All other systems reviewed and are negative.  EKGs/Labs/Other Studies Reviewed:    The following studies were reviewed today:   EKG:   02/11/23: Normal sinus rhythm, rate 65, no ST abnormalities  Recent Labs: 04/22/2022: Magnesium 2.3 04/25/2022: TSH 0.157 06/25/2022: BNP 76.9 01/29/2023: ALT 12; BUN 20; Creatinine, Ser 1.43; Hemoglobin 11.3; Platelets 306; Potassium 4.7; Sodium 142  Recent Lipid Panel    Component Value Date/Time   CHOL 139 01/29/2023 0911   TRIG 90 01/29/2023 0911   HDL 52 01/29/2023 0911   CHOLHDL 2.7 01/29/2023 0911   CHOLHDL 2.3 04/25/2022 0500   VLDL 14 04/25/2022 0500   LDLCALC 70 01/29/2023 0911    Physical Exam:    VS:  BP (!) 110/58 (BP Location: Left Arm, Patient Position: Sitting, Cuff Size: Normal)   Pulse 65   Ht 5\' 9"  (1.753 m)   Wt 180 lb (81.6 kg)   SpO2 99%   BMI 26.58 kg/m     Wt Readings from Last 3 Encounters:  02/11/23 180 lb (81.6 kg)  02/05/23 180 lb (81.6 kg)  11/05/22 181 lb (82.1 kg)     GEN:  Well nourished, well developed in no acute distress HEENT: Normal NECK: No JVD; No carotid bruits CARDIAC: RRR, no murmurs, rubs, gallops RESPIRATORY:  Clear to auscultation without rales, wheezing or rhonchi  ABDOMEN: Soft, non-tender, non-distended MUSCULOSKELETAL:  No edema; No deformity  SKIN: Warm and dry NEUROLOGIC:  Alert and  oriented x 3 PSYCHIATRIC:  Normal affect   ASSESSMENT:    1. Heart failure with recovered ejection fraction (HFrecEF) (HCC)   2. Hypertension associated with diabetes (HCC)   3. Nonobstructive atherosclerosis of coronary artery   4. AKI (acute kidney injury) (HCC)    PLAN:    Heart failure with recovered EF: Echocardiogram 04/2022 during admission for COPD exacerbation showed EF 40 to 45%.  Repeat echo 08/2022 showed EF had improved to 60 to 65%. -Continue Farxiga 10 mg daily -Continue Toprol-XL 25 mg daily -Continue spironolactone 12.5 mg daily -Continue Entresto 24-26 mg twice daily -Check BMET, magnesium  CAD: LHC 04/2022 showed nonobstructive CAD.  Continue ASA, statin  Hypertension: Continue Entresto, spironolactone, Toprol-XL as above.  Appears controlled.  Hyperlipidemia: On lovastatin 40 mg daily.  LDL 70 on 01/29/23  AKI: Creatinine 1.43 on 01/29/2023 (increased from 1.22 on 06/25/2022).  Creatinine was up to 2.2 on 01/18/2023, he was given 1 L of fluid at oncology office.  Recheck BMET  RTC in 6 months     Medication Adjustments/Labs and Tests Ordered: Current medicines are reviewed at length with the patient today.  Concerns regarding medicines are outlined above.  Orders Placed This Encounter  Procedures   Basic metabolic panel   Magnesium   EKG 12-Lead   No orders of the defined types were placed in this encounter.   Patient Instructions  Medication Instructions:   No changes  *If you need a refill on your cardiac medications before your next appointment, please call your pharmacy*   Lab Work:today  BMP MAGNESIUM   If you have labs (blood work) drawn today and your tests are completely normal, you will receive your results only by: MyChart Message (if you have MyChart) OR A paper copy in the mail If you have any lab test that is abnormal or we need to change your treatment, we will call you to review the results.   Testing/Procedures:  Not  needed  Follow-Up: At [facility], you and your health needs are our priority.  As part of our continuing mission to provide you with exceptional heart care, we have created designated Provider Care Teams.  These Care Teams include your primary Cardiologist (physician) and Advanced Practice Providers (APPs -  Physician Assistants and Nurse Practitioners) who all work together to provide you with the care you need, when you need it.  We recommend signing up for the patient portal called "MyChart".  Sign up information is provided on this After Visit Summary.  MyChart is used to connect with patients for Virtual Visits (Telemedicine).  Patients are able to view lab/test results, encounter notes, upcoming appointments, etc.  Non-urgent messages can be sent to your provider as well.   To learn more about what you can do with MyChart, go to [email or web address].    Your next appointment:   6 month(s)  The format for your next appointment:   In Person  Provider:   Little Ishikawa, MD       Signed, Little Ishikawa, MD  02/11/2023 11:41 PM    [facility]

## 2023-02-11 ENCOUNTER — Encounter: Payer: Self-pay | Admitting: Cardiology

## 2023-02-11 ENCOUNTER — Ambulatory Visit: Payer: Medicare Other | Attending: Cardiology | Admitting: Cardiology

## 2023-02-11 VITALS — BP 110/58 | HR 65 | Ht 69.0 in | Wt 180.0 lb

## 2023-02-11 DIAGNOSIS — I5032 Chronic diastolic (congestive) heart failure: Secondary | ICD-10-CM | POA: Diagnosis not present

## 2023-02-11 DIAGNOSIS — I152 Hypertension secondary to endocrine disorders: Secondary | ICD-10-CM | POA: Diagnosis not present

## 2023-02-11 DIAGNOSIS — I2510 Atherosclerotic heart disease of native coronary artery without angina pectoris: Secondary | ICD-10-CM

## 2023-02-11 DIAGNOSIS — E1159 Type 2 diabetes mellitus with other circulatory complications: Secondary | ICD-10-CM

## 2023-02-11 DIAGNOSIS — N179 Acute kidney failure, unspecified: Secondary | ICD-10-CM | POA: Diagnosis not present

## 2023-02-11 NOTE — Patient Instructions (Signed)
Medication Instructions:   No changes  *If you need a refill on your cardiac medications before your next appointment, please call your pharmacy*   Lab Work:today  BMP MAGNESIUM   If you have labs (blood work) drawn today and your tests are completely normal, you will receive your results only by: MyChart Message (if you have MyChart) OR A paper copy in the mail If you have any lab test that is abnormal or we need to change your treatment, we will call you to review the results.   Testing/Procedures:  Not needed  Follow-Up: At [facility], you and your health needs are our priority.  As part of our continuing mission to provide you with exceptional heart care, we have created designated Provider Care Teams.  These Care Teams include your primary Cardiologist (physician) and Advanced Practice Providers (APPs -  Physician Assistants and Nurse Practitioners) who all work together to provide you with the care you need, when you need it.  We recommend signing up for the patient portal called "MyChart".  Sign up information is provided on this After Visit Summary.  MyChart is used to connect with patients for Virtual Visits (Telemedicine).  Patients are able to view lab/test results, encounter notes, upcoming appointments, etc.  Non-urgent messages can be sent to your provider as well.   To learn more about what you can do with MyChart, go to [email or web address].    Your next appointment:   6 month(s)  The format for your next appointment:   In Person  Provider:   Little Ishikawa, MD

## 2023-02-12 LAB — BASIC METABOLIC PANEL
BUN/Creatinine Ratio: 18 (ref 10–24)
BUN: 29 mg/dL — ABNORMAL HIGH (ref 8–27)
CO2: 21 mmol/L (ref 20–29)
Calcium: 10.2 mg/dL (ref 8.6–10.2)
Chloride: 101 mmol/L (ref 96–106)
Creatinine, Ser: 1.65 mg/dL — ABNORMAL HIGH (ref 0.76–1.27)
Glucose: 85 mg/dL (ref 70–99)
Potassium: 5 mmol/L (ref 3.5–5.2)
Sodium: 138 mmol/L (ref 134–144)
eGFR: 43 mL/min/{1.73_m2} — ABNORMAL LOW (ref >59)

## 2023-02-12 LAB — MAGNESIUM: Magnesium: 2.5 mg/dL — ABNORMAL HIGH (ref 1.6–2.3)

## 2023-02-15 ENCOUNTER — Other Ambulatory Visit: Payer: Self-pay

## 2023-02-15 DIAGNOSIS — I5022 Chronic systolic (congestive) heart failure: Secondary | ICD-10-CM

## 2023-02-15 DIAGNOSIS — I10 Essential (primary) hypertension: Secondary | ICD-10-CM

## 2023-02-15 DIAGNOSIS — C775 Secondary and unspecified malignant neoplasm of intrapelvic lymph nodes: Secondary | ICD-10-CM | POA: Diagnosis not present

## 2023-02-17 ENCOUNTER — Telehealth: Payer: Self-pay

## 2023-02-17 NOTE — Telephone Encounter (Signed)
Patient Advocate Encounter   Received notification that patient has been auto renewed to receive Farxiga through [facility] until 05/10/2024.   Please send new rx for 1 yr supply [place] to [facility].  Haze Rushing, BA, CPhT Pharmacy Patient Advocate Specialist Fax: [phone]

## 2023-02-19 MED ORDER — DAPAGLIFLOZIN PROPANEDIOL 10 MG PO TABS: 10.0000 mg | ORAL_TABLET | Freq: Every day | ORAL | 3 refills | Status: DC

## 2023-02-19 NOTE — Addendum Note (Signed)
Addended by: Neoma Laming on: 02/19/2023 12:41 PM   Modules accepted: Orders

## 2023-02-19 NOTE — Telephone Encounter (Signed)
Fargixa 10 mg prescription sent to [facility].

## 2023-02-22 ENCOUNTER — Telehealth: Payer: Self-pay | Admitting: Cardiology

## 2023-02-22 ENCOUNTER — Other Ambulatory Visit: Payer: Self-pay

## 2023-02-22 DIAGNOSIS — I10 Essential (primary) hypertension: Secondary | ICD-10-CM | POA: Diagnosis not present

## 2023-02-22 DIAGNOSIS — E1169 Type 2 diabetes mellitus with other specified complication: Secondary | ICD-10-CM

## 2023-02-22 DIAGNOSIS — N1831 Chronic kidney disease, stage 3a: Secondary | ICD-10-CM

## 2023-02-22 DIAGNOSIS — I5022 Chronic systolic (congestive) heart failure: Secondary | ICD-10-CM | POA: Diagnosis not present

## 2023-02-22 DIAGNOSIS — E1159 Type 2 diabetes mellitus with other circulatory complications: Secondary | ICD-10-CM

## 2023-02-22 NOTE — Telephone Encounter (Signed)
Paper Work Dropped Off: BP readings  Date:02/22/2023  Location of paper:  Dr Bjorn Pippin mailbox

## 2023-02-23 LAB — BASIC METABOLIC PANEL
BUN/Creatinine Ratio: 17 (ref 10–24)
BUN: 27 mg/dL (ref 8–27)
CO2: 19 mmol/L — ABNORMAL LOW (ref 20–29)
Calcium: 9.4 mg/dL (ref 8.6–10.2)
Chloride: 104 mmol/L (ref 96–106)
Creatinine, Ser: 1.57 mg/dL — ABNORMAL HIGH (ref 0.76–1.27)
Glucose: 77 mg/dL (ref 70–99)
Potassium: 4.7 mmol/L (ref 3.5–5.2)
Sodium: 135 mmol/L (ref 134–144)
eGFR: 45 mL/min/{1.73_m2} — ABNORMAL LOW (ref >59)

## 2023-02-23 LAB — SPECIMEN STATUS REPORT

## 2023-02-26 ENCOUNTER — Telehealth: Payer: Self-pay | Admitting: Cardiology

## 2023-02-26 DIAGNOSIS — I152 Hypertension secondary to endocrine disorders: Secondary | ICD-10-CM

## 2023-02-26 DIAGNOSIS — I5022 Chronic systolic (congestive) heart failure: Secondary | ICD-10-CM

## 2023-02-26 MED ORDER — LOSARTAN POTASSIUM 25 MG PO TABS: 25.0000 mg | ORAL_TABLET | Freq: Every day | ORAL | 3 refills | Status: DC

## 2023-02-26 NOTE — Telephone Encounter (Signed)
Spoke to patient Dr.Schumann advised to stop Entresto,Spironolactone,Farxiga.He advised to start Losartan 25 mg daily.Bmet to be done in 1 week.

## 2023-02-26 NOTE — Telephone Encounter (Signed)
Pt c/o medication issue:  1. Name of Medication: Clifton Custard, and Spironolactone  2. How are you currently taking this medication (dosage and times per day)? N/A  3. Are you having a reaction (difficulty breathing--STAT)? No  4. What is your medication issue? Pt would like a callback regarding when and if he is to start back taking above medications being that he has been off of them for over a week. Please advise

## 2023-03-05 DIAGNOSIS — I152 Hypertension secondary to endocrine disorders: Secondary | ICD-10-CM | POA: Diagnosis not present

## 2023-03-05 DIAGNOSIS — E1159 Type 2 diabetes mellitus with other circulatory complications: Secondary | ICD-10-CM | POA: Diagnosis not present

## 2023-03-05 DIAGNOSIS — I5022 Chronic systolic (congestive) heart failure: Secondary | ICD-10-CM | POA: Diagnosis not present

## 2023-03-06 LAB — BASIC METABOLIC PANEL
BUN/Creatinine Ratio: 18 (ref 10–24)
BUN: 25 mg/dL (ref 8–27)
CO2: 23 mmol/L (ref 20–29)
Calcium: 9.9 mg/dL (ref 8.6–10.2)
Chloride: 104 mmol/L (ref 96–106)
Creatinine, Ser: 1.37 mg/dL — ABNORMAL HIGH (ref 0.76–1.27)
Glucose: 75 mg/dL (ref 70–99)
Potassium: 5.1 mmol/L (ref 3.5–5.2)
Sodium: 140 mmol/L (ref 134–144)
eGFR: 53 mL/min/{1.73_m2} — ABNORMAL LOW (ref >59)

## 2023-03-30 ENCOUNTER — Telehealth: Payer: Self-pay | Admitting: Cardiology

## 2023-03-30 NOTE — Telephone Encounter (Signed)
Pt c/o medication issue:  1. Name of Medication: lisinopril (ZESTRIL) 2.5 MG tablet   2. How are you currently taking this medication (dosage and times per day)?   Take 2.5 mg by mouth daily.         3. Are you having a reaction (difficulty breathing--STAT)? No  4. What is your medication issue? Pt would like to know if he's able to start taking this medication again since he's now on Losartan and no longer on Entresto. Please advise

## 2023-03-30 NOTE — Telephone Encounter (Signed)
Called and spoke to patient who wants to know since he is not on Entresto any more if he should go back to taking Lisinopril. He stated he has been off Lisinopril for about 10 days and didn't know if he was to resume medication since he stopped Entresto. He has not been taking his BP at home. Please advise.

## 2023-03-31 NOTE — Telephone Encounter (Signed)
We had stopped his Entresto and started losartan.  He should not take lisinopril with losartan.  Should only take losartan.  I think it would be helpful to schedule follow-up appointment for him since we have made medication changes, can we schedule APP appointment in the next month or so?

## 2023-04-01 NOTE — Telephone Encounter (Addendum)
Spoke to patient.He stated he did stop Lisinopril and Entresto.He is taking Losartan 25 mg daily.Appointment scheduled with Bernadene Person NP 12/10 at 1:55 pm.

## 2023-04-14 ENCOUNTER — Other Ambulatory Visit: Payer: Self-pay | Admitting: Family Medicine

## 2023-04-14 DIAGNOSIS — E119 Type 2 diabetes mellitus without complications: Secondary | ICD-10-CM

## 2023-04-14 DIAGNOSIS — E1169 Type 2 diabetes mellitus with other specified complication: Secondary | ICD-10-CM

## 2023-04-14 DIAGNOSIS — N1831 Chronic kidney disease, stage 3a: Secondary | ICD-10-CM

## 2023-04-14 DIAGNOSIS — E1159 Type 2 diabetes mellitus with other circulatory complications: Secondary | ICD-10-CM

## 2023-04-20 ENCOUNTER — Ambulatory Visit: Payer: Medicare Other | Admitting: Nurse Practitioner

## 2023-04-25 ENCOUNTER — Other Ambulatory Visit: Payer: Self-pay | Admitting: Family Medicine

## 2023-04-25 DIAGNOSIS — I152 Hypertension secondary to endocrine disorders: Secondary | ICD-10-CM

## 2023-04-28 NOTE — Assessment & Plan Note (Addendum)
A1c 6.4. Continue metformin 500 mg daily, will contact cardiologist to inquire why Marcelline Deist was discontinued.  This will guide decision as to whether to increase metformin, restart Marcelline Deist, or start alternative medication option if improved glycemic control is needed.

## 2023-04-28 NOTE — Assessment & Plan Note (Addendum)
Followed by cardiology. BP goal <130/80, stable at goal.  Continue metoprolol 25 mg daily, losartan 25 mg daily.  Will continue to monitor.

## 2023-04-28 NOTE — Progress Notes (Signed)
Established Patient Office Visit  Subjective   Patient ID: NAZIAH URDA, male    DOB: 10/16/1945  Age: 77 y.o. MRN: [id number]  Chief Complaint  Patient presents with   Diabetes   Hypertension    HPI JACKMAN CARRAHER is a 77 y.o. male presenting today for follow up of hypertension, diabetes. Hypertension: Patient here for follow-up of elevated blood pressure.  Pt denies chest pain, SOB, dizziness, edema, syncope, fatigue or heart palpitations. Taking metoprolol and losartan, reports excellent compliance with treatment. Denies side effects. Diabetes: denies hypoglycemic events, wounds or sores that are not healing well, increased thirst or urination. Denies vision problems, eye exam due. Taking metformin as prescribed without any side effects.  He states that his cardiologist discontinued Marcelline Deist but did not tell him the reason for doing so.   Outpatient Medications Prior to Visit  Medication Sig   abiraterone acetate (ZYTIGA) 250 MG tablet Take 1,000 mg by mouth daily.   aspirin EC 81 MG tablet Take 81 mg by mouth daily.   beclomethasone (QVAR REDIHALER) 40 MCG/ACT inhaler Inhale 1 puff into the lungs 2 (two) times daily.   Cholecalciferol (VITAMIN D) 2000 units tablet Take 1 tablet by mouth daily.   Krill Oil (OMEGA-3) 500 MG CAPS Take 1 capsule by mouth daily.   losartan (COZAAR) 25 MG tablet Take 1 tablet (25 mg total) by mouth daily.   lovastatin (MEVACOR) 40 MG tablet Take 1 tablet (40 mg total) by mouth at bedtime.   magnesium oxide (MAG-OX) 400 MG tablet Take 400 mg by mouth daily.   melatonin 5 MG TABS Take 5 mg by mouth at bedtime.   metFORMIN (GLUCOPHAGE) 500 MG tablet TAKE 1 TABLET BY MOUTH DAILY   metoprolol succinate (TOPROL-XL) 25 MG 24 hr tablet Take 1 tablet by mouth once daily   omeprazole (PRILOSEC) 20 MG capsule Take 1 capsule (20 mg total) by mouth daily.   vitamin B-12 (CYANOCOBALAMIN) 500 MCG tablet Take 500 mcg by mouth daily.   [DISCONTINUED] fluticasone  (FLONASE) 50 MCG/ACT nasal spray Place 1 spray into both nostrils daily. (Patient not taking: Reported on 02/11/2023)   [DISCONTINUED] gabapentin (NEURONTIN) 100 MG capsule Take 300 mg by mouth at bedtime. (Patient not taking: Reported on 02/11/2023)   [DISCONTINUED] levalbuterol (XOPENEX HFA) 45 MCG/ACT inhaler Inhale 1 puff into the lungs every 4 (four) hours as needed for wheezing. (Patient not taking: Reported on 02/11/2023)   [DISCONTINUED] Omega-3 Fatty Acids (FISH OIL) 500 MG CAPS Take 500 mg by mouth daily. (Patient not taking: Reported on 02/11/2023)   No facility-administered medications prior to visit.    ROS Negative unless otherwise noted in HPI   Objective:     BP 123/72 (BP Location: Left Arm, Patient Position: Sitting, Cuff Size: Normal)   Pulse 68   Resp 18   Ht 5\' 9"  (1.753 m)   Wt 185 lb (83.9 kg)   SpO2 100%   BMI 27.32 kg/m   Physical Exam Constitutional:      General: He is not in acute distress.    Appearance: Normal appearance.  HENT:     Head: Normocephalic and atraumatic.  Cardiovascular:     Rate and Rhythm: Normal rate and regular rhythm.     Heart sounds: Normal heart sounds. No murmur heard.    No friction rub. No gallop.  Pulmonary:     Effort: Pulmonary effort is normal. No respiratory distress.     Breath sounds: Normal breath sounds. No wheezing,  rhonchi or rales.  Skin:    General: Skin is warm and dry.  Neurological:     Mental Status: He is alert and oriented to person, place, and time.  Psychiatric:        Mood and Affect: Mood normal.     Assessment & Plan:  Hypertension associated with diabetes [facility]) Assessment & Plan: Followed by cardiology. BP goal <130/80, stable at goal.  Continue metoprolol 25 mg daily, losartan 25 mg daily.  Will continue to monitor.  Orders: -     CBC with Differential/Platelet -     Hemoglobin A1c -     Comprehensive metabolic panel  Diabetes mellitus due to underlying condition with stage 3a chronic  kidney disease, without long-term current use of insulin (HCC) Assessment & Plan: A1c 6.4. Continue metformin 500 mg daily, will contact cardiologist to inquire why Marcelline Deist was discontinued.  This will guide decision as to whether to increase metformin, restart Marcelline Deist, or start alternative medication option if improved glycemic control is needed.  Orders: -     CBC with Differential/Platelet -     Hemoglobin A1c -     Comprehensive metabolic panel  Mixed diabetic hyperlipidemia associated with type 2 diabetes mellitus (HCC) Assessment & Plan: Last lipid panel: LDL 70, HDL 52, triglycerides 90.  At goal.  Rechecking lipid panel and hepatic function today.  Continue lovastatin 40 mg daily, will continue to monitor.  Orders: -     Lipid panel -     CBC with Differential/Platelet -     Hemoglobin A1c -     Comprehensive metabolic panel  Need for influenza vaccination  Chronic systolic CHF (congestive heart failure), NYHA class 1 (HCC)    Return in about 3 months (around 07/29/2023) for follow-up for HTN, HLD, DM, POC A1C and urine sample at visit.    Melida Quitter, PA

## 2023-04-29 DIAGNOSIS — C3411 Malignant neoplasm of upper lobe, right bronchus or lung: Secondary | ICD-10-CM | POA: Diagnosis not present

## 2023-04-29 DIAGNOSIS — C775 Secondary and unspecified malignant neoplasm of intrapelvic lymph nodes: Secondary | ICD-10-CM | POA: Diagnosis not present

## 2023-04-30 ENCOUNTER — Ambulatory Visit (INDEPENDENT_AMBULATORY_CARE_PROVIDER_SITE_OTHER): Payer: Medicare Other | Admitting: Family Medicine

## 2023-04-30 ENCOUNTER — Other Ambulatory Visit: Payer: Medicare Other

## 2023-04-30 ENCOUNTER — Encounter: Payer: Self-pay | Admitting: Family Medicine

## 2023-04-30 VITALS — BP 123/72 | HR 68 | Resp 18 | Ht 69.0 in | Wt 185.0 lb

## 2023-04-30 DIAGNOSIS — Z7984 Long term (current) use of oral hypoglycemic drugs: Secondary | ICD-10-CM

## 2023-04-30 DIAGNOSIS — E1159 Type 2 diabetes mellitus with other circulatory complications: Secondary | ICD-10-CM

## 2023-04-30 DIAGNOSIS — E0822 Diabetes mellitus due to underlying condition with diabetic chronic kidney disease: Secondary | ICD-10-CM | POA: Diagnosis not present

## 2023-04-30 DIAGNOSIS — I5022 Chronic systolic (congestive) heart failure: Secondary | ICD-10-CM

## 2023-04-30 DIAGNOSIS — E782 Mixed hyperlipidemia: Secondary | ICD-10-CM | POA: Diagnosis not present

## 2023-04-30 DIAGNOSIS — I152 Hypertension secondary to endocrine disorders: Secondary | ICD-10-CM | POA: Diagnosis not present

## 2023-04-30 DIAGNOSIS — E1169 Type 2 diabetes mellitus with other specified complication: Secondary | ICD-10-CM | POA: Diagnosis not present

## 2023-04-30 DIAGNOSIS — N1831 Chronic kidney disease, stage 3a: Secondary | ICD-10-CM | POA: Diagnosis not present

## 2023-04-30 DIAGNOSIS — Z23 Encounter for immunization: Secondary | ICD-10-CM | POA: Diagnosis not present

## 2023-04-30 NOTE — Patient Instructions (Signed)
I will let you know if we need to make any medication changes once the results of your labs are back.  Otherwise, continue with your current routine and I will see you at your next appointment!

## 2023-04-30 NOTE — Assessment & Plan Note (Signed)
Last lipid panel: LDL 70, HDL 52, triglycerides 90.  At goal.  Rechecking lipid panel and hepatic function today.  Continue lovastatin 40 mg daily, will continue to monitor.

## 2023-05-01 LAB — COMPREHENSIVE METABOLIC PANEL
ALT: 20 [IU]/L (ref 0–44)
AST: 23 [IU]/L (ref 0–40)
Albumin: 4.1 g/dL (ref 3.8–4.8)
Alkaline Phosphatase: 86 [IU]/L (ref 44–121)
BUN/Creatinine Ratio: 15 (ref 10–24)
BUN: 20 mg/dL (ref 8–27)
Bilirubin Total: 0.4 mg/dL (ref 0.0–1.2)
CO2: 21 mmol/L (ref 20–29)
Calcium: 9.6 mg/dL (ref 8.6–10.2)
Chloride: 105 mmol/L (ref 96–106)
Creatinine, Ser: 1.35 mg/dL — ABNORMAL HIGH (ref 0.76–1.27)
Globulin, Total: 2.2 g/dL (ref 1.5–4.5)
Glucose: 143 mg/dL — ABNORMAL HIGH (ref 70–99)
Potassium: 4.4 mmol/L (ref 3.5–5.2)
Sodium: 142 mmol/L (ref 134–144)
Total Protein: 6.3 g/dL (ref 6.0–8.5)
eGFR: 54 mL/min/{1.73_m2} — ABNORMAL LOW (ref >59)

## 2023-05-01 LAB — CBC WITH DIFFERENTIAL/PLATELET
Basophils Absolute: 0.1 10*3/uL (ref 0.0–0.2)
Basos: 1 %
EOS (ABSOLUTE): 0.4 10*3/uL (ref 0.0–0.4)
Eos: 7 %
Hematocrit: 35.6 % — ABNORMAL LOW (ref 37.5–51.0)
Hemoglobin: 11.2 g/dL — ABNORMAL LOW (ref 13.0–17.7)
Immature Grans (Abs): 0 10*3/uL (ref 0.0–0.1)
Immature Granulocytes: 0 %
Lymphocytes Absolute: 1.8 10*3/uL (ref 0.7–3.1)
Lymphs: 33 %
MCH: 28.1 pg (ref 26.6–33.0)
MCHC: 31.5 g/dL (ref 31.5–35.7)
MCV: 89 fL (ref 79–97)
Monocytes Absolute: 0.5 10*3/uL (ref 0.1–0.9)
Monocytes: 10 %
Neutrophils Absolute: 2.7 10*3/uL (ref 1.4–7.0)
Neutrophils: 49 %
Platelets: 269 10*3/uL (ref 150–450)
RBC: 3.99 x10E6/uL — ABNORMAL LOW (ref 4.14–5.80)
RDW: 13.3 % (ref 11.6–15.4)
WBC: 5.4 10*3/uL (ref 3.4–10.8)

## 2023-05-01 LAB — LIPID PANEL
Chol/HDL Ratio: 2.4 {ratio} (ref 0.0–5.0)
Cholesterol, Total: 158 mg/dL (ref 100–199)
HDL: 67 mg/dL (ref >39)
LDL Chol Calc (NIH): 64 mg/dL (ref 0–99)
Triglycerides: 164 mg/dL — ABNORMAL HIGH (ref 0–149)
VLDL Cholesterol Cal: 27 mg/dL (ref 5–40)

## 2023-05-01 LAB — HEMOGLOBIN A1C
Est. average glucose Bld gHb Est-mCnc: 131 mg/dL
Hgb A1c MFr Bld: 6.2 % — ABNORMAL HIGH (ref 4.8–5.6)

## 2023-05-03 DIAGNOSIS — J449 Chronic obstructive pulmonary disease, unspecified: Secondary | ICD-10-CM | POA: Diagnosis not present

## 2023-05-03 DIAGNOSIS — C3492 Malignant neoplasm of unspecified part of left bronchus or lung: Secondary | ICD-10-CM | POA: Diagnosis not present

## 2023-05-03 DIAGNOSIS — C3411 Malignant neoplasm of upper lobe, right bronchus or lung: Secondary | ICD-10-CM | POA: Diagnosis not present

## 2023-05-07 ENCOUNTER — Other Ambulatory Visit: Payer: Self-pay | Admitting: Family Medicine

## 2023-05-07 ENCOUNTER — Encounter: Payer: Self-pay | Admitting: Cardiology

## 2023-05-07 ENCOUNTER — Ambulatory Visit: Payer: Medicare Other | Admitting: Family Medicine

## 2023-05-07 ENCOUNTER — Ambulatory Visit: Payer: Medicare Other | Attending: Cardiology | Admitting: Cardiology

## 2023-05-07 VITALS — BP 122/58 | HR 75 | Ht 69.0 in | Wt 186.0 lb

## 2023-05-07 DIAGNOSIS — I2510 Atherosclerotic heart disease of native coronary artery without angina pectoris: Secondary | ICD-10-CM

## 2023-05-07 DIAGNOSIS — R0602 Shortness of breath: Secondary | ICD-10-CM

## 2023-05-07 DIAGNOSIS — E785 Hyperlipidemia, unspecified: Secondary | ICD-10-CM

## 2023-05-07 DIAGNOSIS — E119 Type 2 diabetes mellitus without complications: Secondary | ICD-10-CM

## 2023-05-07 DIAGNOSIS — N1831 Chronic kidney disease, stage 3a: Secondary | ICD-10-CM | POA: Diagnosis not present

## 2023-05-07 DIAGNOSIS — E782 Mixed hyperlipidemia: Secondary | ICD-10-CM

## 2023-05-07 DIAGNOSIS — I5032 Chronic diastolic (congestive) heart failure: Secondary | ICD-10-CM

## 2023-05-07 DIAGNOSIS — I10 Essential (primary) hypertension: Secondary | ICD-10-CM

## 2023-05-07 NOTE — Progress Notes (Signed)
Cardiology Office Note:    Date:  05/07/2023   ID:  Alan Hurst, DOB Oct 29, 1945, MRN [id number]  PCP:  Melida Quitter, PA  Cardiologist:  Little Ishikawa, MD  Electrophysiologist:  None   Referring MD: Melida Quitter, PA   No chief complaint on file.   History of Present Illness:    Alan Hurst is a 77 y.o. male with a hx of CAD, chronic systolic heart failure, hypertension, hyperlipidemia, COPD, T2DM, CKD stage IIIb who presents for follow-up.  He was admitted 04/2022 with shortness of breath and chest pain and shortness of breath.  Troponin elevated to 500.  Echocardiogram showed EF 40 to 45%.  LHC showed nonobstructive CAD.  Troponin elevation felt to be demand ischemia in setting of COPD.  Echocardiogram 08/2022 showed EF 60 to 65%, normal RV function, no significant valvular disease.  Since last clinic visit, he reports he is doing okay.  States he feels like he has less stamina recently and having some shortness of breath.  Also reports some burning chest pain at night when lying flat.  Denies any lightheadedness, syncope, lower extremity edema, or palpitations.  Not exercising but takes care of wife who is disabled. Reports BP 130/70s when checks at home.    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   No alcohol in many, many years     Sleep disorder 12/05/2015   Vitamin B deficiency 12/05/2015   Vitamin D insufficiency 12/05/2015    Past Surgical History:  Procedure Laterality Date   CATARACT EXTRACTION, BILATERAL  10/2016   COLONOSCOPY     FEMUR FRACTURE SURGERY     HIP FRACTURE SURGERY     JOINT REPLACEMENT Bilateral    hips   LEFT HEART CATH AND CORONARY ANGIOGRAPHY N/A 04/27/2022   Procedure: LEFT HEART CATH AND CORONARY ANGIOGRAPHY;  Surgeon: Kathleene Hazel, MD;  Location: [facility] INVASIVE CV [facility];  Service: Cardiovascular;  Laterality: N/A;   TIBIA FRACTURE SURGERY     TOTAL HIP REVISION Right 08/07/2016   Procedure: RIGHT TOTAL HIP REVISION;  Surgeon: Samson Frederic, MD;  Location: [facility] OR;  Service: Orthopedics;  Laterality: Right;    Current Medications: Current Meds  Medication Sig   abiraterone acetate (ZYTIGA) 250 MG tablet Take 1,000 mg by mouth daily.   aspirin EC 81 MG tablet Take 81 mg by mouth daily.   beclomethasone (QVAR REDIHALER) 40 MCG/ACT inhaler Inhale 1 puff into the lungs 2 (two) times daily.   Cholecalciferol (VITAMIN D) 2000 units tablet Take 1 tablet by mouth daily.   Krill Oil (OMEGA-3) 500 MG CAPS Take 1 capsule by mouth daily.   levalbuterol (XOPENEX HFA) 45 MCG/ACT inhaler Inhale into  the lungs every 4 (four) hours as needed for wheezing.   losartan (COZAAR) 25 MG tablet Take 1 tablet (25 mg total) by mouth daily.   lovastatin (MEVACOR) 40 MG tablet Take 1 tablet (40 mg total) by mouth at bedtime.   magnesium oxide (MAG-OX) 400 MG tablet Take 400 mg by mouth daily.   melatonin 5 MG TABS Take 5 mg by mouth at bedtime.   metFORMIN (GLUCOPHAGE) 500 MG tablet TAKE 1 TABLET BY MOUTH DAILY   metoprolol succinate (TOPROL-XL) 25 MG 24 hr tablet Take 1 tablet by mouth once daily   omeprazole (PRILOSEC) 20 MG capsule Take 1 capsule (20 mg total) by mouth daily.   vitamin B-12 (CYANOCOBALAMIN) 500 MCG tablet  Take 500 mcg by mouth daily.     Allergies:   No known allergies and Flavoring agent   Social History   Socioeconomic History   Marital status: Married    Spouse name: [name]   Number of children: 0   Years of education: Not on file   Highest education level: Not on file  Occupational History   Occupation: semi retired  Tobacco Use   Smoking status: Former    Current packs/day: 0.00    Average packs/day: 1.5 packs/day for 32.0 years (48.0 ttl pk-yrs)    Types: Cigarettes    Start date: 05/11/1961    Quit date: 05/11/1993    Years since quitting: 30.0    Passive exposure: Never   Smokeless tobacco: Never  Vaping Use   Vaping status: Never Used  Substance and Sexual Activity   Alcohol use: Not Currently   Drug use: No   Sexual activity: Not Currently    Birth control/protection: None  Other Topics Concern   Not on file  Social History Narrative   Not on file   Social Drivers of Health   Financial Resource Strain: Medium Risk (11/20/2021)   Overall Financial Resource Strain (CARDIA)    Difficulty of Paying Living Expenses: Somewhat hard  Food Insecurity: Low Risk  (05/03/2023)   Received from [facility]   Hunger Vital Sign    Worried About Running Out of Food in the Last Year: Never true    Ran Out of Food in the Last Year: Never true  Transportation Needs: No Transportation Needs (05/03/2023)   Received from [facility]    In the past 12 months, has lack of reliable transportation kept you from medical appointments, meetings, work or from getting things needed for daily living? : No  Physical Activity: Insufficiently Active (11/20/2021)   Exercise Vital Sign    Days of Exercise per Week: 2 days    Minutes of Exercise per Session: 20 min  Stress: Stress Concern Present (11/20/2021)   [employer] of Occupational Health - Occupational Stress Questionnaire    Feeling of Stress : To some extent  Social Connections: Moderately Integrated  (11/20/2021)   Social Connection and Isolation Panel [NHANES]    Frequency of Communication with Friends and Family: More than three times a week    Frequency of Social Gatherings with Friends and Family: Twice a week    Attends Religious Services: Never    Database administrator or Organizations: Yes    Attends Engineer, structural: More than 4 times per year    Marital Status: Married     Family History: The patient's family history includes Alcohol abuse in his father and mother; Congestive Heart Failure in his father; Kidney disease in  his father. There is no history of Colon cancer, Esophageal cancer, Rectal cancer, or Stomach cancer.  ROS:   Please see the history of present illness.     All other systems reviewed and are negative.  EKGs/Labs/Other Studies Reviewed:    The following studies were reviewed today:   EKG:   02/11/23: Normal sinus rhythm, rate 65, no ST abnormalities  Recent Labs: 06/25/2022: BNP 76.9 02/11/2023: Magnesium 2.5 04/30/2023: ALT 20; BUN 20; Creatinine, Ser 1.35; Hemoglobin 11.2; Platelets 269; Potassium 4.4; Sodium 142  Recent Lipid Panel    Component Value Date/Time   CHOL 158 04/30/2023 0908   TRIG 164 (H) 04/30/2023 0908   HDL 67 04/30/2023 0908   CHOLHDL 2.4 04/30/2023 0908   CHOLHDL 2.3 04/25/2022 0500   VLDL 14 04/25/2022 0500   LDLCALC 64 04/30/2023 0908    Physical Exam:    VS:  BP (!) 122/58 (BP Location: Left Arm, Patient Position: Sitting, Cuff Size: Normal)   Pulse 75   Ht 5\' 9"  (1.753 m)   Wt 186 lb (84.4 kg)   SpO2 98%   BMI 27.47 kg/m     Wt Readings from Last 3 Encounters:  05/07/23 186 lb (84.4 kg)  04/30/23 185 lb (83.9 kg)  02/11/23 180 lb (81.6 kg)     GEN:  Well nourished, well developed in no acute distress HEENT: Normal NECK: No JVD; No carotid bruits CARDIAC: RRR, no murmurs, rubs, gallops RESPIRATORY:  Clear to auscultation without rales, wheezing or rhonchi  ABDOMEN: Soft, non-tender,  non-distended MUSCULOSKELETAL:  No edema; No deformity  SKIN: Warm and dry NEUROLOGIC:  Alert and oriented x 3 PSYCHIATRIC:  Normal affect   ASSESSMENT:    1. Heart failure with recovered ejection fraction (HFrecEF) (HCC)   2. SOB (shortness of breath)   3. Coronary artery disease involving native coronary artery of native heart without angina pectoris   4. Essential hypertension   5. Hyperlipidemia, unspecified hyperlipidemia type   6. CKD stage 3a, GFR 45-59 ml/min (HCC)     PLAN:    Heart failure with recovered EF: Echocardiogram 04/2022 during admission for COPD exacerbation showed EF 40 to 45%.  Repeat echo 08/2022 showed EF had improved to 60 to 65%. -Worsening renal function 02/2023, his diuretics (Farxiga, spironolactone) were discontinued.  Entresto was changed to losartan 25 mg daily.  Continue Toprol-XL 25 mg daily -Reports dyspnea recently, will update echocardiogram  CAD: LHC 04/2022 showed nonobstructive CAD.  Continue ASA, statin  Hypertension: Continue losartan, Toprol-XL as above.  Appears controlled.  Hyperlipidemia: On lovastatin 40 mg daily.  LDL 64 on 04/30/23  CKD stage IIIa: Creatinine 1.35 on 04/30/2023.  Creatinine was up to 1.65 02/2023, Farxiga and spironolactone were discontinued and Entresto was changed to losartan.  RTC in 6 months     Medication Adjustments/Labs and Tests Ordered: Current medicines are reviewed at length with the patient today.  Concerns regarding medicines are outlined above.  Orders Placed This Encounter  Procedures   ECHOCARDIOGRAM COMPLETE   No orders of the defined types were placed in this encounter.   Patient Instructions  Medication Instructions:  Continue current medications *If you need a refill on your cardiac medications before your next appointment, please call your pharmacy*   Lab Work: none If you have labs (blood work) drawn today and your tests are completely normal, you will receive your results only  by: MyChart Message (if you have MyChart) OR A paper copy in the mail If you have  any lab test that is abnormal or we need to change your treatment, we will call you to review the results.   Testing/Procedures: ECHO Your physician has requested that you have an echocardiogram. Echocardiography is a painless test that uses sound waves to create images of your heart. It provides your doctor with information about the size and shape of your heart and how well your heart's chambers and valves are working. This procedure takes approximately one hour. There are no restrictions for this procedure. Please do NOT wear cologne, perfume, aftershave, or lotions (deodorant is allowed). Please arrive 15 minutes prior to your appointment time.  Please note: We ask at that you not bring children with you during ultrasound (echo/ vascular) testing. Due to room size and safety concerns, children are not allowed in the ultrasound rooms during exams. Our front office staff cannot provide observation of children in our lobby area while testing is being conducted. An adult accompanying a patient to their appointment will only be allowed in the ultrasound room at the discretion of the ultrasound technician under special circumstances. We apologize for any inconvenience.    Follow-Up: At [facility], you and your health needs are our priority.  As part of our continuing mission to provide you with exceptional heart care, we have created designated Provider Care Teams.  These Care Teams include your primary Cardiologist (physician) and Advanced Practice Providers (APPs -  Physician Assistants and Nurse Practitioners) who all work together to provide you with the care you need, when you need it.  We recommend signing up for the patient portal called "MyChart".  Sign up information is provided on this After Visit Summary.  MyChart is used to connect with patients for Virtual Visits (Telemedicine).  Patients are  able to view lab/test results, encounter notes, upcoming appointments, etc.  Non-urgent messages can be sent to your provider as well.   To learn more about what you can do with MyChart, go to [email or web address].    Your next appointment:   6 month(s)  Provider:   Little Ishikawa, MD     Other Instructions none        Signed, Little Ishikawa, MD  05/07/2023 8:32 AM    [facility]

## 2023-05-07 NOTE — Patient Instructions (Signed)
Medication Instructions:  Continue current medications *If you need a refill on your cardiac medications before your next appointment, please call your pharmacy*   Lab Work: none If you have labs (blood work) drawn today and your tests are completely normal, you will receive your results only by: MyChart Message (if you have MyChart) OR A paper copy in the mail If you have any lab test that is abnormal or we need to change your treatment, we will call you to review the results.   Testing/Procedures: ECHO Your physician has requested that you have an echocardiogram. Echocardiography is a painless test that uses sound waves to create images of your heart. It provides your doctor with information about the size and shape of your heart and how well your heart's chambers and valves are working. This procedure takes approximately one hour. There are no restrictions for this procedure. Please do NOT wear cologne, perfume, aftershave, or lotions (deodorant is allowed). Please arrive 15 minutes prior to your appointment time.  Please note: We ask at that you not bring children with you during ultrasound (echo/ vascular) testing. Due to room size and safety concerns, children are not allowed in the ultrasound rooms during exams. Our front office staff cannot provide observation of children in our lobby area while testing is being conducted. An adult accompanying a patient to their appointment will only be allowed in the ultrasound room at the discretion of the ultrasound technician under special circumstances. We apologize for any inconvenience.    Follow-Up: At [facility], you and your health needs are our priority.  As part of our continuing mission to provide you with exceptional heart care, we have created designated Provider Care Teams.  These Care Teams include your primary Cardiologist (physician) and Advanced Practice Providers (APPs -  Physician Assistants and Nurse Practitioners)  who all work together to provide you with the care you need, when you need it.  We recommend signing up for the patient portal called "MyChart".  Sign up information is provided on this After Visit Summary.  MyChart is used to connect with patients for Virtual Visits (Telemedicine).  Patients are able to view lab/test results, encounter notes, upcoming appointments, etc.  Non-urgent messages can be sent to your provider as well.   To learn more about what you can do with MyChart, go to [email or web address].    Your next appointment:   6 month(s)  Provider:   Little Ishikawa, MD     Other Instructions none

## 2023-05-25 ENCOUNTER — Ambulatory Visit: Payer: Medicare Other | Admitting: Nurse Practitioner

## 2023-06-01 DIAGNOSIS — Z923 Personal history of irradiation: Secondary | ICD-10-CM | POA: Diagnosis not present

## 2023-06-01 DIAGNOSIS — C3411 Malignant neoplasm of upper lobe, right bronchus or lung: Secondary | ICD-10-CM | POA: Diagnosis not present

## 2023-06-03 ENCOUNTER — Ambulatory Visit (HOSPITAL_COMMUNITY): Payer: Medicare Other | Attending: Cardiology

## 2023-06-03 DIAGNOSIS — R0602 Shortness of breath: Secondary | ICD-10-CM | POA: Diagnosis not present

## 2023-06-03 LAB — ECHOCARDIOGRAM COMPLETE
Area-P 1/2: 3.83 cm2
S' Lateral: 2.7 cm

## 2023-06-08 ENCOUNTER — Encounter: Payer: Self-pay | Admitting: Cardiology

## 2023-06-08 ENCOUNTER — Ambulatory Visit: Payer: Medicare Other | Attending: Cardiology | Admitting: Cardiology

## 2023-06-08 VITALS — BP 120/58 | HR 69 | Ht 69.0 in | Wt 187.2 lb

## 2023-06-08 DIAGNOSIS — C3411 Malignant neoplasm of upper lobe, right bronchus or lung: Secondary | ICD-10-CM | POA: Diagnosis not present

## 2023-06-08 DIAGNOSIS — I2510 Atherosclerotic heart disease of native coronary artery without angina pectoris: Secondary | ICD-10-CM

## 2023-06-08 DIAGNOSIS — I10 Essential (primary) hypertension: Secondary | ICD-10-CM

## 2023-06-08 DIAGNOSIS — N1831 Chronic kidney disease, stage 3a: Secondary | ICD-10-CM

## 2023-06-08 DIAGNOSIS — I5032 Chronic diastolic (congestive) heart failure: Secondary | ICD-10-CM | POA: Diagnosis not present

## 2023-06-08 DIAGNOSIS — E785 Hyperlipidemia, unspecified: Secondary | ICD-10-CM | POA: Diagnosis not present

## 2023-06-08 DIAGNOSIS — C775 Secondary and unspecified malignant neoplasm of intrapelvic lymph nodes: Secondary | ICD-10-CM | POA: Diagnosis not present

## 2023-06-08 MED ORDER — FUROSEMIDE 40 MG PO TABS: 40.0000 mg | ORAL_TABLET | ORAL | 3 refills | Status: AC | PRN

## 2023-06-08 NOTE — Progress Notes (Signed)
Cardiology Office Note:    Date:  06/08/2023   ID:  Alan Hurst, DOB 09-Nov-1945, MRN [id number]  PCP:  Melida Quitter, PA  Cardiologist:  Little Ishikawa, MD  Electrophysiologist:  None   Referring MD: Melida Quitter, PA   Chief Complaint  Patient presents with   Congestive Heart Failure    History of Present Illness:    Alan Hurst is a 78 y.o. male with a hx of CAD, chronic systolic heart failure, hypertension, hyperlipidemia, COPD, T2DM, CKD stage IIIb who presents for follow-up.  He was admitted 04/2022 with shortness of breath and chest pain and shortness of breath.  Troponin elevated to 500.  Echocardiogram showed EF 40 to 45%.  LHC showed nonobstructive CAD.  Troponin elevation felt to be demand ischemia in setting of COPD.  Echocardiogram 08/2022 showed EF 60 to 65%, normal RV function, no significant valvular disease.  Echocardiogram 06/03/2023 showed normal biventricular function, normal diastolic function, no significant valvular disease.  Since last clinic visit, he reports he is doing okay.  States that was having swelling in his legs but has gone down. Denies any chest pain, dyspnea, lightheadedness, syncope, or palpitations.   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   [facility]   Recovering alcoholic in remission (HCC) 12/05/2015   No alcohol in many, many years     Sleep disorder 12/05/2015   Vitamin B deficiency 12/05/2015   Vitamin D insufficiency 12/05/2015    Past Surgical History:  Procedure Laterality Date   CATARACT EXTRACTION, BILATERAL  10/2016   COLONOSCOPY     FEMUR FRACTURE SURGERY     HIP FRACTURE SURGERY     JOINT REPLACEMENT Bilateral    hips   LEFT HEART CATH AND CORONARY ANGIOGRAPHY N/A 04/27/2022   Procedure: LEFT HEART CATH AND CORONARY ANGIOGRAPHY;  Surgeon: Kathleene Hazel, MD;  Location: [facility]VASIVE [facility];  Service: Cardiovascular;  Laterality: N/A;   TIBIA FRACTURE SURGERY     TOTAL HIP REVISION Right 08/07/2016   Procedure: RIGHT TOTAL HIP REVISION;  Surgeon: Samson Frederic, MD;  Location: [facility] OR;  Service: Orthopedics;  Laterality: Right;    Current Medications: Current Meds  Medication Sig   abiraterone acetate (ZYTIGA) 250 MG tablet Take 1,000 mg by mouth daily.   aspirin EC 81 MG tablet Take 81 mg by mouth daily.   beclomethasone (QVAR REDIHALER) 40 MCG/ACT inhaler Inhale 1 puff into the lungs 2 (two) times daily.   Cholecalciferol (VITAMIN D) 2000 units tablet Take 1 tablet by mouth daily.   furosemide (LASIX) 40 MG tablet  Take 1 tablet (40 mg total) by mouth as needed. Please take daily as needed for greater than 3 lbs a day or 5 lbs in a week   Krill Oil (OMEGA-3) 500 MG CAPS Take 1 capsule by mouth daily.   levalbuterol (XOPENEX HFA) 45 MCG/ACT inhaler Inhale into the lungs every 4 (four) hours as needed for wheezing.   lovastatin (MEVACOR) 40 MG tablet TAKE 1 TABLET BY MOUTH AT  BEDTIME   magnesium oxide (MAG-OX) 400 MG tablet Take 400 mg by mouth daily.   melatonin 5 MG TABS Take 5 mg by mouth at bedtime.   metFORMIN (GLUCOPHAGE) 500 MG tablet TAKE 1 TABLET BY MOUTH DAILY   metoprolol succinate (TOPROL-XL) 25 MG 24 hr  tablet Take 1 tablet by mouth once daily   omeprazole (PRILOSEC) 20 MG capsule Take 1 capsule (20 mg total) by mouth daily.   vitamin B-12 (CYANOCOBALAMIN) 500 MCG tablet Take 500 mcg by mouth daily.     Allergies:   No known allergies and Flavoring agent (non-screening)   Social History   Socioeconomic History   Marital status: Married    Spouse name: [name]   Number of children: 0   Years of education: Not on file   Highest education level: Not on file  Occupational History   Occupation: semi retired  Tobacco Use   Smoking status: Former    Current packs/day: 0.00    Average packs/day: 1.5 packs/day for 32.0 years (48.0 ttl pk-yrs)    Types: Cigarettes    Start date: 05/11/1961    Quit date: 05/11/1993    Years since quitting: 30.0    Passive exposure: Never   Smokeless tobacco: Never  Vaping Use   Vaping status: Never Used  Substance and Sexual Activity   Alcohol use: Not Currently   Drug use: No   Sexual activity: Not Currently    Birth control/protection: None  Other Topics Concern   Not on file  Social History Narrative   Not on file   Social Drivers of Health   Financial Resource Strain: Medium Risk (11/20/2021)   Overall Financial Resource Strain (CARDIA)    Difficulty of Paying Living Expenses: Somewhat hard  Food Insecurity: Low Risk  (05/03/2023)   Received from [facility]   Hunger Vital Sign    Worried About Running Out of Food in the Last Year: Never true    Ran Out of Food in the Last Year: Never true  Transportation Needs: No Transportation Needs (05/03/2023)   Received from [facility]    In the past 12 months, has lack of reliable transportation kept you from medical appointments, meetings, work or from getting things needed for daily living? : No  Physical Activity: Insufficiently Active (11/20/2021)   Exercise Vital Sign    Days of Exercise per Week: 2 days    Minutes of Exercise per Session: 20 min  Stress: Stress Concern  Present (11/20/2021)   [employer] of Occupational Health - Occupational Stress Questionnaire    Feeling of Stress : To some extent  Social Connections: Moderately Integrated (11/20/2021)   Social Connection and Isolation Panel [NHANES]    Frequency of Communication with Friends and Family: More than three times a week    Frequency of Social Gatherings with Friends and Family: Twice a week    Attends Religious Services: Never    Database administrator or Organizations: Yes    Attends Engineer, structural: More than 4 times  per year    Marital Status: Married     Family History: The patient's family history includes Alcohol abuse in his father and mother; Congestive Heart Failure in his father; Kidney disease in his father. There is no history of Colon cancer, Esophageal cancer, Rectal cancer, or Stomach cancer.  ROS:   Please see the history of present illness.     All other systems reviewed and are negative.  EKGs/Labs/Other Studies Reviewed:    The following studies were reviewed today:   EKG:   02/11/23: Normal sinus rhythm, rate 65, no ST abnormalities  Recent Labs: 06/25/2022: BNP 76.9 02/11/2023: Magnesium 2.5 04/30/2023: ALT 20; BUN 20; Creatinine, Ser 1.35; Hemoglobin 11.2; Platelets 269; Potassium 4.4; Sodium 142  Recent Lipid Panel    Component Value Date/Time   CHOL 158 04/30/2023 0908   TRIG 164 (H) 04/30/2023 0908   HDL 67 04/30/2023 0908   CHOLHDL 2.4 04/30/2023 0908   CHOLHDL 2.3 04/25/2022 0500   VLDL 14 04/25/2022 0500   LDLCALC 64 04/30/2023 0908    Physical Exam:    VS:  BP (!) 120/58   Pulse 69   Ht 5\' 9"  (1.753 m)   Wt 187 lb 3.2 oz (84.9 kg)   SpO2 92%   BMI 27.64 kg/m     Wt Readings from Last 3 Encounters:  06/08/23 187 lb 3.2 oz (84.9 kg)  05/07/23 186 lb (84.4 kg)  04/30/23 185 lb (83.9 kg)     GEN:  Well nourished, well developed in no acute distress HEENT: Normal NECK: No JVD; No carotid bruits CARDIAC: RRR, no  murmurs, rubs, gallops RESPIRATORY:  Clear to auscultation without rales, wheezing or rhonchi  ABDOMEN: Soft, non-tender, non-distended MUSCULOSKELETAL:  No edema; No deformity  SKIN: Warm and dry NEUROLOGIC:  Alert and oriented x 3 PSYCHIATRIC:  Normal affect   ASSESSMENT:    1. Heart failure with recovered ejection fraction (HFrecEF) (HCC)   2. Coronary artery disease involving native coronary artery of native heart without angina pectoris   3. Essential hypertension   4. Hyperlipidemia, unspecified hyperlipidemia type   5. CKD stage 3a, GFR 45-59 ml/min (HCC)      PLAN:    Heart failure with recovered EF: Echocardiogram 04/2022 during admission for COPD exacerbation showed EF 40 to 45%.  Repeat echo 08/2022 showed EF had improved to 60 to 65%.  Echocardiogram 06/03/2023 showed normal biventricular function, normal diastolic function, no significant valvular disease. -Worsening renal function 02/2023, his diuretics (Farxiga, spironolactone) were discontinued.  Entresto was changed to losartan 25 mg daily.  Continue Toprol-XL 25 mg daily -Reports recent lower extremity edema, improved on exam today.  Will prescribe Lasix 40 mg daily as needed.  Asked to monitor daily weights.  Can take Lasix if gains more than 3 pounds in 1 day or 5 pounds in 1 week, or worsening edema  CAD: LHC 04/2022 showed nonobstructive CAD.  Continue ASA, statin  Hypertension: Continue losartan, Toprol-XL as above.  Appears controlled.  Hyperlipidemia: On lovastatin 40 mg daily.  LDL 64 on 04/30/23  CKD stage IIIa: Creatinine 1.35 on 04/30/2023.  Creatinine was up to 1.65 02/2023, Farxiga and spironolactone were discontinued and Entresto was changed to losartan.  Creatinine 1.35 on 04/30/2023.  Check BMET   RTC in 6 months    Medication Adjustments/Labs and Tests Ordered: Current medicines are reviewed at length with the patient today.  Concerns regarding medicines are outlined above.  Orders Placed This  Encounter  Procedures  Basic Metabolic Panel (BMET)   Magnesium   Meds ordered this encounter  Medications   furosemide (LASIX) 40 MG tablet    Sig: Take 1 tablet (40 mg total) by mouth as needed. Please take daily as needed for greater than 3 lbs a day or 5 lbs in a week    Dispense:  90 tablet    Refill:  3    Patient Instructions  Medication Instructions:  Start Lasix 40 mg as needed daily for weight gain greater than 3 lbs in a day or 5 lbs in a week Continue all current medications *If you need a refill on your cardiac medications before your next appointment, please call your pharmacy*   Lab Work: Bmet, mg today If you have labs (blood work) drawn today and your tests are completely normal, you will receive your results only by: MyChart Message (if you have MyChart) OR A paper copy in the mail If you have any lab test that is abnormal or we need to change your treatment, we will call you to review the results.   Testing/Procedures: none   Follow-Up: At [facility], you and your health needs are our priority.  As part of our continuing mission to provide you with exceptional heart care, we have created designated Provider Care Teams.  These Care Teams include your primary Cardiologist (physician) and Advanced Practice Providers (APPs -  Physician Assistants and Nurse Practitioners) who all work together to provide you with the care you need, when you need it.  We recommend signing up for the patient portal called "MyChart".  Sign up information is provided on this After Visit Summary.  MyChart is used to connect with patients for Virtual Visits (Telemedicine).  Patients are able to view lab/test results, encounter notes, upcoming appointments, etc.  Non-urgent messages can be sent to your provider as well.   To learn more about what you can do with MyChart, go to [email or web address].    Your next appointment:   As scheduled in November 03 2023  @9 :40am  Provider:   Little Ishikawa, MD     Other Instructions Please check weight daily  and take lasix as needed for weight gain greater than 3 lbs in a or 5 lbs in a week         Signed, Little Ishikawa, MD  06/08/2023 3:13 PM    [facility]

## 2023-06-08 NOTE — Patient Instructions (Addendum)
Medication Instructions:  Start Lasix 40 mg as needed daily for weight gain greater than 3 lbs in a day or 5 lbs in a week Continue all current medications *If you need a refill on your cardiac medications before your next appointment, please call your pharmacy*   Lab Work: Bmet, mg today If you have labs (blood work) drawn today and your tests are completely normal, you will receive your results only by: MyChart Message (if you have MyChart) OR A paper copy in the mail If you have any lab test that is abnormal or we need to change your treatment, we will call you to review the results.   Testing/Procedures: none   Follow-Up: At [facility], you and your health needs are our priority.  As part of our continuing mission to provide you with exceptional heart care, we have created designated Provider Care Teams.  These Care Teams include your primary Cardiologist (physician) and Advanced Practice Providers (APPs -  Physician Assistants and Nurse Practitioners) who all work together to provide you with the care you need, when you need it.  We recommend signing up for the patient portal called "MyChart".  Sign up information is provided on this After Visit Summary.  MyChart is used to connect with patients for Virtual Visits (Telemedicine).  Patients are able to view lab/test results, encounter notes, upcoming appointments, etc.  Non-urgent messages can be sent to your provider as well.   To learn more about what you can do with MyChart, go to [email or web address].    Your next appointment:   As scheduled in November 03 2023 @9 :40am  Provider:   Little Ishikawa, MD     Other Instructions Please check weight daily  and take lasix as needed for weight gain greater than 3 lbs in a or 5 lbs in a week

## 2023-06-09 LAB — BASIC METABOLIC PANEL
BUN/Creatinine Ratio: 20 (ref 10–24)
BUN: 23 mg/dL (ref 8–27)
CO2: 20 mmol/L (ref 20–29)
Calcium: 9.5 mg/dL (ref 8.6–10.2)
Chloride: 105 mmol/L (ref 96–106)
Creatinine, Ser: 1.17 mg/dL (ref 0.76–1.27)
Glucose: 91 mg/dL (ref 70–99)
Potassium: 4.2 mmol/L (ref 3.5–5.2)
Sodium: 140 mmol/L (ref 134–144)
eGFR: 64 mL/min/{1.73_m2} (ref >59)

## 2023-06-09 LAB — MAGNESIUM: Magnesium: 2.3 mg/dL (ref 1.6–2.3)

## 2023-06-10 DIAGNOSIS — Z87891 Personal history of nicotine dependence: Secondary | ICD-10-CM | POA: Diagnosis not present

## 2023-06-10 DIAGNOSIS — Z192 Hormone resistant malignancy status: Secondary | ICD-10-CM | POA: Diagnosis not present

## 2023-06-10 DIAGNOSIS — C786 Secondary malignant neoplasm of retroperitoneum and peritoneum: Secondary | ICD-10-CM | POA: Diagnosis not present

## 2023-06-10 DIAGNOSIS — Z96643 Presence of artificial hip joint, bilateral: Secondary | ICD-10-CM | POA: Diagnosis not present

## 2023-06-10 DIAGNOSIS — Z85118 Personal history of other malignant neoplasm of bronchus and lung: Secondary | ICD-10-CM | POA: Diagnosis not present

## 2023-06-10 DIAGNOSIS — Z923 Personal history of irradiation: Secondary | ICD-10-CM | POA: Diagnosis not present

## 2023-06-10 DIAGNOSIS — C3411 Malignant neoplasm of upper lobe, right bronchus or lung: Secondary | ICD-10-CM | POA: Diagnosis not present

## 2023-06-10 DIAGNOSIS — C775 Secondary and unspecified malignant neoplasm of intrapelvic lymph nodes: Secondary | ICD-10-CM | POA: Diagnosis not present

## 2023-06-17 ENCOUNTER — Encounter: Payer: Self-pay | Admitting: Family Medicine

## 2023-06-17 ENCOUNTER — Ambulatory Visit: Payer: Medicare Other

## 2023-06-17 DIAGNOSIS — Z0000 Encounter for general adult medical examination without abnormal findings: Secondary | ICD-10-CM | POA: Diagnosis not present

## 2023-06-17 NOTE — Patient Instructions (Signed)
 Mr. Alan Hurst , Thank you for taking time to come for your Medicare Wellness Visit. I appreciate your ongoing commitment to your health goals. Please review the following plan we discussed and let me know if I can assist you in the future.   Referrals/Orders/Follow-Ups/Clinician Recommendations: none  This is a list of the screening recommended for you and due dates:  Health Maintenance  Topic Date Due   Zoster (Shingles) Vaccine (1 of 2) Never done   Eye exam for diabetics  01/07/2022   Colon Cancer Screening  06/19/2022   Yearly kidney health urinalysis for diabetes  11/21/2022   COVID-19 Vaccine (4 - 2024-25 season) 01/10/2023   Flu Shot  08/09/2023*   Hepatitis C Screening  10/26/2028*   Hemoglobin A1C  10/29/2023   Complete foot exam   11/05/2023   Yearly kidney function blood test for diabetes  06/07/2024   Medicare Annual Wellness Visit  06/16/2024   DTaP/Tdap/Td vaccine (2 - Td or Tdap) 03/26/2027   Pneumonia Vaccine  Completed   HPV Vaccine  Aged Out  *Topic was postponed. The date shown is not the original due date.    Advanced directives: (Copy Requested) Please bring a copy of your health care power of attorney and living will to the office to be added to your chart at your convenience.  Next Medicare Annual Wellness Visit scheduled for next year: Yes  insert Preventive Care attachment Insert FALL PREVENTION attachment if needed

## 2023-06-17 NOTE — Progress Notes (Signed)
 Subjective:   JOFFRE LUCKS is a 78 y.o. male who presents for Medicare Annual/Subsequent preventive examination.  Visit Complete: Virtual I connected with  Alm GORMAN Conrad on 06/17/23 by a audio enabled telemedicine application and verified that I am speaking with the correct person using two identifiers.  Interactive audio and video telecommunications were attempted between this provider and patient, however failed, due to patient having technical difficulties OR patient did not have access to video capability.  We continued and completed visit with audio only.  Patient Location: Home  Provider Location: Home Office  I discussed the limitations of evaluation and management by telemedicine. The patient expressed understanding and agreed to proceed.  Vital Signs: Because this visit was a virtual/telehealth visit, some criteria may be missing or patient reported. Any vitals not documented were not able to be obtained and vitals that have been documented are patient reported.    Cardiac Risk Factors include: advanced age (>51men, >41 women);diabetes mellitus;dyslipidemia;hypertension;male gender     Objective:    Today's Vitals   There is no height or weight on file to calculate BMI.     06/17/2023   11:37 AM 04/23/2022    3:31 PM 10/26/2016    9:33 AM 08/05/2016    4:00 AM 08/04/2016   10:57 PM 08/04/2016    1:15 PM 12/05/2015    1:30 PM  Advanced Directives  Does Patient Have a Medical Advance Directive? Yes Yes No No No No Yes  Type of Advance Directive Living will;Healthcare Power of [employer] Power of [place];Living will     Living will;Healthcare Power of Attorney  Does patient want to make changes to medical advance directive?  No - Patient declined       Copy of Healthcare Power of Attorney in Chart? No - copy requested No - copy requested       Would patient like information on creating a medical advance directive?   No - Patient declined No - Patient declined  No - Patient declined      Current Medications (verified) Outpatient Encounter Medications as of 06/17/2023  Medication Sig   abiraterone  acetate (ZYTIGA ) 250 MG tablet Take 1,000 mg by mouth daily.   aspirin  EC 81 MG tablet Take 81 mg by mouth daily.   beclomethasone (QVAR  REDIHALER) 40 MCG/ACT inhaler Inhale 1 puff into the lungs 2 (two) times daily.   Cholecalciferol  (VITAMIN D ) 2000 units tablet Take 1 tablet by mouth daily.   furosemide  (LASIX ) 40 MG tablet Take 1 tablet (40 mg total) by mouth as needed. Please take daily as needed for greater than 3 lbs a day or 5 lbs in a week   Krill Oil (OMEGA-3) 500 MG CAPS Take 1 capsule by mouth daily.   levalbuterol  (XOPENEX  HFA) 45 MCG/ACT inhaler Inhale into the lungs every 4 (four) hours as needed for wheezing.   lovastatin  (MEVACOR ) 40 MG tablet TAKE 1 TABLET BY MOUTH AT  BEDTIME   magnesium  oxide (MAG-OX) 400 MG tablet Take 400 mg by mouth daily.   melatonin 5 MG TABS Take 5 mg by mouth at bedtime.   metFORMIN  (GLUCOPHAGE ) 500 MG tablet TAKE 1 TABLET BY MOUTH DAILY   metoprolol  succinate (TOPROL -XL) 25 MG 24 hr tablet Take 1 tablet by mouth once daily   omeprazole  (PRILOSEC) 20 MG capsule Take 1 capsule (20 mg total) by mouth daily.   vitamin B-12 (CYANOCOBALAMIN ) 500 MCG tablet Take 500 mcg by mouth daily.   losartan  (COZAAR ) 25 MG  tablet Take 1 tablet (25 mg total) by mouth daily.   No facility-administered encounter medications on file as of 06/17/2023.    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   [facility]   Recovering alcoholic in remission (HCC) 12/05/2015   No alcohol in many, many years     Sleep disorder 12/05/2015   Vitamin B deficiency 12/05/2015   Vitamin D  insufficiency 12/05/2015   Past Surgical History:  Procedure Laterality Date   CATARACT EXTRACTION, BILATERAL  10/2016   COLONOSCOPY     FEMUR FRACTURE SURGERY     HIP FRACTURE SURGERY     JOINT REPLACEMENT Bilateral    hips   LEFT HEART CATH AND CORONARY ANGIOGRAPHY N/A 04/27/2022   Procedure: LEFT HEART CATH AND CORONARY ANGIOGRAPHY;  Surgeon: Verlin Lonni BIRCH, MD;  Location: [facility];  Service: Cardiovascular;  Laterality: N/A;   TIBIA FRACTURE SURGERY     TOTAL HIP REVISION Right 08/07/2016   Procedure: RIGHT TOTAL HIP REVISION;  Surgeon: Redell Shoals, MD;  Location: [facility] OR;  Service: Orthopedics;  Laterality: Right;   Family History  Problem Relation Age of Onset   Alcohol abuse Mother    Alcohol abuse Father    Congestive Heart Failure Father    Kidney disease Father    Colon cancer Neg Hx    Esophageal cancer Neg Hx    Rectal cancer Neg Hx    Stomach cancer Neg Hx    Social History   Socioeconomic History   Marital status: Married    Spouse name: Clarita   Number of children: 0   Years of education: Not on file   Highest education level: Not on file  Occupational History   Occupation: semi retired  Tobacco Use   Smoking status: Former    Current packs/day: 0.00    Average packs/day: 1.5 packs/day for 32.0 years (48.0 ttl pk-yrs)    Types: Cigarettes    Start date: 05/11/1961    Quit date: 05/11/1993    Years since quitting: 30.1    Passive exposure: Never   Smokeless tobacco: Never  Vaping  Use   Vaping status: Never Used  Substance and Sexual Activity   Alcohol use: Not Currently   Drug use: No   Sexual activity: Not Currently    Birth control/protection: None  Other Topics Concern   Not on file  Social History Narrative   Not on file   Social Drivers of Health   Financial Resource Strain: Low Risk  (06/17/2023)   Overall Financial Resource Strain (CARDIA)    Difficulty of Paying Living Expenses: Not hard at all  Food Insecurity: No Food Insecurity (06/17/2023)   Hunger Vital Sign  Worried About Programme Researcher, Broadcasting/film/video in the Last Year: Never true    Ran Out of Food in the Last Year: Never true  Transportation Needs: No Transportation Needs (06/17/2023)   PRAPARE - Administrator, Civil Service (Medical): No    Lack of Transportation (Non-Medical): No  Physical Activity: Inactive (06/17/2023)   Exercise Vital Sign    Days of Exercise per Week: 0 days    Minutes of Exercise per Session: 0 min  Stress: No Stress Concern Present (06/17/2023)   [employer] of Occupational Health - Occupational Stress Questionnaire    Feeling of Stress : Only a little  Social Connections: Moderately Integrated (06/17/2023)   Social Connection and Isolation Panel [NHANES]    Frequency of Communication with Friends and Family: More than three times a week    Frequency of Social Gatherings with Friends and Family: Twice a week    Attends Religious Services: Never    Database Administrator or Organizations: Yes    Attends Engineer, Structural: More than 4 times per year    Marital Status: Married    Tobacco Counseling Counseling given: Not Answered   Clinical Intake:  Pre-visit preparation completed: Yes  Pain : No/denies pain     Nutritional Risks: None Diabetes: Yes CBG done?: No Did pt. bring in CBG monitor from home?: No  How often do you need to have someone help you when you read instructions, pamphlets, or other written materials from your doctor or  pharmacy?: 1 - Never  Interpreter Needed?: No  Information entered by :: NAllen LPN   Activities of Daily Living    06/17/2023   11:29 AM  In your present state of health, do you have any difficulty performing the following activities:  Hearing? 1  Vision? 0  Difficulty concentrating or making decisions? 0  Walking or climbing stairs? 1  Dressing or bathing? 0  Doing errands, shopping? 0  Preparing Food and eating ? N  Using the Toilet? N  In the past six months, have you accidently leaked urine? N  Do you have problems with loss of bowel control? N  Managing your Medications? N  Managing your Finances? N  Housekeeping or managing your Housekeeping? N    Patient Care Team: Wallace Joesph LABOR, PA as PCP - General (Family Medicine) Kate Lonni CROME, MD as PCP - Cardiology (Cardiology) Cammie Batters, OD as Referring Physician (Optometry) Livingston Rigg, MD (Inactive) as Consulting Physician (Dermatology)  Indicate any recent Medical Services you may have received from other than [facility] providers in the past year (date may be approximate).     Assessment:   This is a routine wellness examination for Jaystin.  Hearing/Vision screen Hearing Screening - Comments:: States has some hearing issues Vision Screening - Comments:: Regular eye exams, [facility]   Goals Addressed             This Visit's Progress    Patient Stated       06/17/2023, wants to get through radiation therapy       Depression Screen    06/17/2023   11:39 AM 04/30/2023    9:16 AM 02/05/2023   11:03 AM 02/24/2022    3:23 PM 11/20/2021    9:36 AM 07/01/2021    8:44 AM 02/24/2021    9:46 AM  PHQ 2/9 Scores  PHQ - 2 Score 0 0 0 0 0 0 1  PHQ- 9 Score 1 2 3  0 1 2 3  Fall Risk    06/17/2023   11:38 AM 02/24/2022    3:23 PM 11/20/2021    9:36 AM 07/01/2021    8:43 AM 02/24/2021    9:45 AM  Fall Risk   Falls in the past year? 0 0 0 0 0  Number falls in past yr: 0 0 0 0 0  Injury with Fall? 0 0 0 0  0  Risk for fall due to : Medication side effect No Fall Risks No Fall Risks No Fall Risks No Fall Risks  Follow up Falls prevention discussed;Falls evaluation completed Falls evaluation completed Falls evaluation completed Falls evaluation completed Falls evaluation completed    MEDICARE RISK AT HOME: Medicare Risk at Home Any stairs in or around the home?: Yes If so, are there any without handrails?: No Home free of loose throw rugs in walkways, pet beds, electrical cords, etc?: Yes Adequate lighting in your home to reduce risk of falls?: Yes Life alert?: No Use of a cane, walker or w/c?: No Grab bars in the bathroom?: Yes Shower chair or bench in shower?: No Elevated toilet seat or a handicapped toilet?: No  TIMED UP AND GO:  Was the test performed?  No    Cognitive Function:        06/17/2023   11:40 AM 11/20/2021    9:43 AM 10/24/2020   10:00 AM 05/31/2019    8:10 AM 03/17/2018   11:17 AM  6CIT Screen  What Year? 0 points 0 points 0 points 0 points 0 points  What month? 0 points 0 points 0 points 0 points 0 points  What time? 0 points 0 points 0 points 0 points 0 points  Count back from 20 0 points 0 points 0 points 0 points 0 points  Months in reverse 0 points 0 points 0 points 0 points 0 points  Repeat phrase 0 points 0 points 0 points 0 points 0 points  Total Score 0 points 0 points 0 points 0 points 0 points    Immunizations Immunization History  Administered Date(s) Administered   Fluad Quad(high Dose 65+) 02/16/2019, 02/24/2021, 04/25/2022   Influenza Split 05/20/2012   Influenza, High Dose Seasonal PF 03/31/2013, 05/10/2015, 03/02/2016, 03/01/2017, 02/15/2018   Influenza-Unspecified 05/20/2012   PFIZER(Purple Top)SARS-COV-2 Vaccination 06/16/2019, 07/07/2019, 04/26/2020   Pneumococcal Conjugate-13 05/10/2015, 03/02/2016   Pneumococcal Polysaccharide-23 03/31/2013   Tdap 03/25/2017    TDAP status: Up to date  Flu Vaccine status: Due, Education has been  provided regarding the importance of this vaccine. Advised may receive this vaccine at local pharmacy or Health Dept. Aware to provide a copy of the vaccination record if obtained from local pharmacy or Health Dept. Verbalized acceptance and understanding.  Pneumococcal vaccine status: Up to date  Covid-19 vaccine status: Information provided on how to obtain vaccines.   Qualifies for Shingles Vaccine? Yes   Zostavax completed No   Shingrix Completed?: No.    Education has been provided regarding the importance of this vaccine. Patient has been advised to call insurance company to determine out of pocket expense if they have not yet received this vaccine. Advised may also receive vaccine at local pharmacy or Health Dept. Verbalized acceptance and understanding.  Screening Tests Health Maintenance  Topic Date Due   Zoster Vaccines- Shingrix (1 of 2) Never done   OPHTHALMOLOGY EXAM  01/07/2022   Colonoscopy  06/19/2022   Diabetic kidney evaluation - Urine ACR  11/21/2022   COVID-19 Vaccine (4 - 2024-25 season) 01/10/2023   INFLUENZA  VACCINE  08/09/2023 (Originally 12/10/2022)   Hepatitis C Screening  10/26/2028 (Originally 02/15/1964)   HEMOGLOBIN A1C  10/29/2023   FOOT EXAM  11/05/2023   Diabetic kidney evaluation - eGFR measurement  06/07/2024   Medicare Annual Wellness (AWV)  06/16/2024   DTaP/Tdap/Td (2 - Td or Tdap) 03/26/2027   Pneumonia Vaccine 58+ Years old  Completed   HPV VACCINES  Aged Out    Health Maintenance  Health Maintenance Due  Topic Date Due   Zoster Vaccines- Shingrix (1 of 2) Never done   OPHTHALMOLOGY EXAM  01/07/2022   Colonoscopy  06/19/2022   Diabetic kidney evaluation - Urine ACR  11/21/2022   COVID-19 Vaccine (4 - 2024-25 season) 01/10/2023    Colorectal cancer screening: Type of screening: Colonoscopy. Completed 06/20/2019. Repeat every 5 years  Lung Cancer Screening: (Low Dose CT Chest recommended if Age 33-80 years, 20 pack-year currently smoking OR  have quit w/in 15years.) does not qualify.   Lung Cancer Screening Referral: no  Additional Screening:  Hepatitis C Screening: does not qualify;   Vision Screening: Recommended annual ophthalmology exams for early detection of glaucoma and other disorders of the eye. Is the patient up to date with their annual eye exam?  No  Who is the provider or what is the name of the office in which the patient attends annual eye exams? [facility] If pt is not established with a provider, would they like to be referred to a provider to establish care? No .   Dental Screening: Recommended annual dental exams for proper oral hygiene  Diabetic Foot Exam: Diabetic Foot Exam: Completed 11/05/2022  Community Resource Referral / Chronic Care Management: CRR required this visit?  No   CCM required this visit?  No     Plan:     I have personally reviewed and noted the following in the patient's chart:   Medical and social history Use of alcohol, tobacco or illicit drugs  Current medications and supplements including opioid prescriptions. Patient is not currently taking opioid prescriptions. Functional ability and status Nutritional status Physical activity Advanced directives List of other physicians Hospitalizations, surgeries, and ER visits in previous 12 months Vitals Screenings to include cognitive, depression, and falls Referrals and appointments  In addition, I have reviewed and discussed with patient certain preventive protocols, quality metrics, and best practice recommendations. A written personalized care plan for preventive services as well as general preventive health recommendations were provided to patient.     Ardella FORBES Dawn, LPN   11/10/7972   After Visit Summary: (Pick Up) Due to this being a telephonic visit, with patients personalized plan was offered to patient and patient has requested to Pick up at office.  Nurse Notes: none

## 2023-06-18 ENCOUNTER — Encounter: Payer: Self-pay | Admitting: Family Medicine

## 2023-07-05 DIAGNOSIS — C786 Secondary malignant neoplasm of retroperitoneum and peritoneum: Secondary | ICD-10-CM | POA: Diagnosis not present

## 2023-07-05 DIAGNOSIS — C775 Secondary and unspecified malignant neoplasm of intrapelvic lymph nodes: Secondary | ICD-10-CM | POA: Diagnosis not present

## 2023-07-08 ENCOUNTER — Ambulatory Visit: Payer: Medicare Other | Admitting: Cardiology

## 2023-07-08 DIAGNOSIS — C3411 Malignant neoplasm of upper lobe, right bronchus or lung: Secondary | ICD-10-CM | POA: Diagnosis not present

## 2023-07-08 DIAGNOSIS — C3492 Malignant neoplasm of unspecified part of left bronchus or lung: Secondary | ICD-10-CM | POA: Diagnosis not present

## 2023-07-08 DIAGNOSIS — J449 Chronic obstructive pulmonary disease, unspecified: Secondary | ICD-10-CM | POA: Diagnosis not present

## 2023-07-10 DIAGNOSIS — C775 Secondary and unspecified malignant neoplasm of intrapelvic lymph nodes: Secondary | ICD-10-CM | POA: Diagnosis not present

## 2023-07-14 ENCOUNTER — Other Ambulatory Visit: Payer: Self-pay | Admitting: *Deleted

## 2023-07-14 ENCOUNTER — Other Ambulatory Visit: Payer: Self-pay

## 2023-07-14 DIAGNOSIS — Z510 Encounter for antineoplastic radiation therapy: Secondary | ICD-10-CM | POA: Diagnosis not present

## 2023-07-14 DIAGNOSIS — C3411 Malignant neoplasm of upper lobe, right bronchus or lung: Secondary | ICD-10-CM | POA: Diagnosis not present

## 2023-07-14 DIAGNOSIS — J441 Chronic obstructive pulmonary disease with (acute) exacerbation: Secondary | ICD-10-CM

## 2023-07-14 DIAGNOSIS — I152 Hypertension secondary to endocrine disorders: Secondary | ICD-10-CM

## 2023-07-14 MED ORDER — LOSARTAN POTASSIUM 25 MG PO TABS: 25.0000 mg | ORAL_TABLET | Freq: Every day | ORAL | 3 refills | Status: DC

## 2023-07-15 MED ORDER — METOPROLOL SUCCINATE ER 25 MG PO TB24: 25.0000 mg | ORAL_TABLET | Freq: Every day | ORAL | 0 refills | Status: DC

## 2023-07-15 NOTE — Telephone Encounter (Signed)
 Refilled metoprolol succinate.  Levalbuterol is prescribed by his pulmonologist, he is no longer taking lisinopril, he is taking losartan instead.

## 2023-07-29 ENCOUNTER — Ambulatory Visit: Payer: Medicare Other | Admitting: Family Medicine

## 2023-07-30 ENCOUNTER — Emergency Department (HOSPITAL_COMMUNITY)

## 2023-07-30 ENCOUNTER — Other Ambulatory Visit: Payer: Self-pay

## 2023-07-30 ENCOUNTER — Encounter (HOSPITAL_COMMUNITY): Payer: Self-pay

## 2023-07-30 ENCOUNTER — Inpatient Hospital Stay (HOSPITAL_COMMUNITY)
Admission: EM | Admit: 2023-07-30 | Discharge: 2023-08-01 | DRG: 389 | Disposition: A | Discharge status: Home / Self Care | Attending: Family Medicine | Admitting: Family Medicine

## 2023-07-30 DIAGNOSIS — Z8249 Family history of ischemic heart disease and other diseases of the circulatory system: Secondary | ICD-10-CM | POA: Diagnosis not present

## 2023-07-30 DIAGNOSIS — R1111 Vomiting without nausea: Secondary | ICD-10-CM | POA: Diagnosis not present

## 2023-07-30 DIAGNOSIS — Z85118 Personal history of other malignant neoplasm of bronchus and lung: Secondary | ICD-10-CM | POA: Diagnosis not present

## 2023-07-30 DIAGNOSIS — I129 Hypertensive chronic kidney disease with stage 1 through stage 4 chronic kidney disease, or unspecified chronic kidney disease: Secondary | ICD-10-CM | POA: Diagnosis not present

## 2023-07-30 DIAGNOSIS — E876 Hypokalemia: Secondary | ICD-10-CM | POA: Diagnosis not present

## 2023-07-30 DIAGNOSIS — Z7951 Long term (current) use of inhaled steroids: Secondary | ICD-10-CM

## 2023-07-30 DIAGNOSIS — E872 Acidosis, unspecified: Secondary | ICD-10-CM | POA: Diagnosis present

## 2023-07-30 DIAGNOSIS — R112 Nausea with vomiting, unspecified: Principal | ICD-10-CM | POA: Diagnosis present

## 2023-07-30 DIAGNOSIS — A084 Viral intestinal infection, unspecified: Secondary | ICD-10-CM | POA: Diagnosis present

## 2023-07-30 DIAGNOSIS — E1122 Type 2 diabetes mellitus with diabetic chronic kidney disease: Secondary | ICD-10-CM | POA: Diagnosis not present

## 2023-07-30 DIAGNOSIS — Z1152 Encounter for screening for COVID-19: Secondary | ICD-10-CM

## 2023-07-30 DIAGNOSIS — J439 Emphysema, unspecified: Secondary | ICD-10-CM | POA: Diagnosis not present

## 2023-07-30 DIAGNOSIS — N1831 Chronic kidney disease, stage 3a: Secondary | ICD-10-CM | POA: Diagnosis present

## 2023-07-30 DIAGNOSIS — K219 Gastro-esophageal reflux disease without esophagitis: Secondary | ICD-10-CM | POA: Diagnosis not present

## 2023-07-30 DIAGNOSIS — Z79899 Other long term (current) drug therapy: Secondary | ICD-10-CM | POA: Diagnosis not present

## 2023-07-30 DIAGNOSIS — Z7984 Long term (current) use of oral hypoglycemic drugs: Secondary | ICD-10-CM

## 2023-07-30 DIAGNOSIS — J90 Pleural effusion, not elsewhere classified: Secondary | ICD-10-CM | POA: Diagnosis not present

## 2023-07-30 DIAGNOSIS — K828 Other specified diseases of gallbladder: Secondary | ICD-10-CM | POA: Diagnosis not present

## 2023-07-30 DIAGNOSIS — Z87891 Personal history of nicotine dependence: Secondary | ICD-10-CM | POA: Diagnosis not present

## 2023-07-30 DIAGNOSIS — I252 Old myocardial infarction: Secondary | ICD-10-CM | POA: Diagnosis not present

## 2023-07-30 DIAGNOSIS — C7800 Secondary malignant neoplasm of unspecified lung: Secondary | ICD-10-CM | POA: Diagnosis not present

## 2023-07-30 DIAGNOSIS — J441 Chronic obstructive pulmonary disease with (acute) exacerbation: Secondary | ICD-10-CM | POA: Diagnosis not present

## 2023-07-30 DIAGNOSIS — K56609 Unspecified intestinal obstruction, unspecified as to partial versus complete obstruction: Principal | ICD-10-CM | POA: Diagnosis present

## 2023-07-30 DIAGNOSIS — Z923 Personal history of irradiation: Secondary | ICD-10-CM | POA: Diagnosis not present

## 2023-07-30 DIAGNOSIS — R509 Fever, unspecified: Secondary | ICD-10-CM | POA: Diagnosis not present

## 2023-07-30 DIAGNOSIS — R1084 Generalized abdominal pain: Secondary | ICD-10-CM | POA: Diagnosis not present

## 2023-07-30 DIAGNOSIS — I700 Atherosclerosis of aorta: Secondary | ICD-10-CM | POA: Diagnosis not present

## 2023-07-30 DIAGNOSIS — E785 Hyperlipidemia, unspecified: Secondary | ICD-10-CM | POA: Diagnosis not present

## 2023-07-30 DIAGNOSIS — R051 Acute cough: Secondary | ICD-10-CM

## 2023-07-30 DIAGNOSIS — Z841 Family history of disorders of kidney and ureter: Secondary | ICD-10-CM

## 2023-07-30 DIAGNOSIS — Z7982 Long term (current) use of aspirin: Secondary | ICD-10-CM | POA: Diagnosis not present

## 2023-07-30 DIAGNOSIS — C61 Malignant neoplasm of prostate: Secondary | ICD-10-CM | POA: Diagnosis present

## 2023-07-30 DIAGNOSIS — Z811 Family history of alcohol abuse and dependence: Secondary | ICD-10-CM

## 2023-07-30 DIAGNOSIS — R059 Cough, unspecified: Secondary | ICD-10-CM | POA: Diagnosis not present

## 2023-07-30 DIAGNOSIS — J9811 Atelectasis: Secondary | ICD-10-CM | POA: Diagnosis not present

## 2023-07-30 DIAGNOSIS — F1021 Alcohol dependence, in remission: Secondary | ICD-10-CM | POA: Diagnosis present

## 2023-07-30 LAB — PROTIME-INR
INR: 1.1 (ref 0.8–1.2)
INR: 1.2 (ref 0.8–1.2)
Prothrombin Time: 14.6 s (ref 11.4–15.2)
Prothrombin Time: 15.2 s (ref 11.4–15.2)

## 2023-07-30 LAB — URINALYSIS, W/ REFLEX TO CULTURE (INFECTION SUSPECTED)
Bilirubin Urine: NEGATIVE
Glucose, UA: NEGATIVE mg/dL
Ketones, ur: 15 mg/dL — AB
Leukocytes,Ua: NEGATIVE
Nitrite: NEGATIVE
Protein, ur: NEGATIVE mg/dL
Specific Gravity, Urine: 1.025 (ref 1.005–1.030)
pH: 6 (ref 5.0–8.0)

## 2023-07-30 LAB — COMPREHENSIVE METABOLIC PANEL
ALT: 20 U/L (ref 0–44)
AST: 26 U/L (ref 15–41)
Albumin: 3.5 g/dL (ref 3.5–5.0)
Alkaline Phosphatase: 57 U/L (ref 38–126)
Anion gap: 15 (ref 5–15)
BUN: 25 mg/dL — ABNORMAL HIGH (ref 8–23)
CO2: 17 mmol/L — ABNORMAL LOW (ref 22–32)
Calcium: 9.2 mg/dL (ref 8.9–10.3)
Chloride: 106 mmol/L (ref 98–111)
Creatinine, Ser: 1.35 mg/dL — ABNORMAL HIGH (ref 0.61–1.24)
GFR, Estimated: 54 mL/min — ABNORMAL LOW (ref >60)
Glucose, Bld: 142 mg/dL — ABNORMAL HIGH (ref 70–99)
Potassium: 3.3 mmol/L — ABNORMAL LOW (ref 3.5–5.1)
Sodium: 138 mmol/L (ref 135–145)
Total Bilirubin: 0.7 mg/dL (ref 0.0–1.2)
Total Protein: 6.5 g/dL (ref 6.5–8.1)

## 2023-07-30 LAB — RESP PANEL BY RT-PCR (RSV, FLU A&B, COVID)  RVPGX2
Influenza A by PCR: NEGATIVE
Influenza B by PCR: NEGATIVE
Resp Syncytial Virus by PCR: NEGATIVE
SARS Coronavirus 2 by RT PCR: NEGATIVE

## 2023-07-30 LAB — CBC WITH DIFFERENTIAL/PLATELET
Abs Immature Granulocytes: 0.02 10*3/uL (ref 0.00–0.07)
Basophils Absolute: 0 10*3/uL (ref 0.0–0.1)
Basophils Relative: 0 %
Eosinophils Absolute: 0.3 10*3/uL (ref 0.0–0.5)
Eosinophils Relative: 5 %
HCT: 39.7 % (ref 39.0–52.0)
Hemoglobin: 13 g/dL (ref 13.0–17.0)
Immature Granulocytes: 0 %
Lymphocytes Relative: 3 %
Lymphs Abs: 0.2 10*3/uL — ABNORMAL LOW (ref 0.7–4.0)
MCH: 27.7 pg (ref 26.0–34.0)
MCHC: 32.7 g/dL (ref 30.0–36.0)
MCV: 84.6 fL (ref 80.0–100.0)
Monocytes Absolute: 0.1 10*3/uL (ref 0.1–1.0)
Monocytes Relative: 2 %
Neutro Abs: 4.7 10*3/uL (ref 1.7–7.7)
Neutrophils Relative %: 90 %
Platelets: 175 10*3/uL (ref 150–400)
RBC: 4.69 MIL/uL (ref 4.22–5.81)
RDW: 14.4 % (ref 11.5–15.5)
WBC: 5.3 10*3/uL (ref 4.0–10.5)
nRBC: 0 % (ref 0.0–0.2)

## 2023-07-30 LAB — APTT: aPTT: 29 s (ref 24–36)

## 2023-07-30 LAB — LACTIC ACID, PLASMA: Lactic Acid, Venous: 1.3 mmol/L (ref 0.5–1.9)

## 2023-07-30 MED ORDER — IPRATROPIUM-ALBUTEROL 0.5-2.5 (3) MG/3ML IN SOLN: 3.0000 mL | Freq: Once | RESPIRATORY_TRACT | Status: AC

## 2023-07-30 MED ORDER — ONDANSETRON HCL 4 MG/2ML IJ SOLN
INTRAMUSCULAR | Status: AC
  Administered 2023-07-30: 4 mg via INTRAVENOUS
  Filled 2023-07-30: qty 2

## 2023-07-30 MED ORDER — ACETAMINOPHEN 500 MG PO TABS
1000.0000 mg | ORAL_TABLET | Freq: Once | ORAL | Status: AC
  Administered 2023-07-30: 1000 mg via ORAL
  Filled 2023-07-30: qty 2

## 2023-07-30 MED ORDER — LACTATED RINGERS IV BOLUS
500.0000 mL | Freq: Once | INTRAVENOUS | Status: AC
  Administered 2023-07-30: 500 mL via INTRAVENOUS

## 2023-07-30 MED ORDER — ONDANSETRON HCL 4 MG/2ML IJ SOLN: 4.0000 mg | Freq: Once | INTRAMUSCULAR | Status: AC

## 2023-07-30 MED ORDER — SODIUM CHLORIDE 0.9 % IV SOLN
1.0000 g | Freq: Once | INTRAVENOUS | Status: AC
  Administered 2023-07-30: 1 g via INTRAVENOUS
  Filled 2023-07-30: qty 10

## 2023-07-30 MED ORDER — IOHEXOL 350 MG/ML SOLN
75.0000 mL | Freq: Once | INTRAVENOUS | Status: AC | PRN
  Administered 2023-07-30: 75 mL via INTRAVENOUS

## 2023-07-30 MED ORDER — IPRATROPIUM-ALBUTEROL 0.5-2.5 (3) MG/3ML IN SOLN
RESPIRATORY_TRACT | Status: AC
  Filled 2023-07-30: qty 3

## 2023-07-30 NOTE — ED Notes (Signed)
 Provider made aware of pt's BP decreasing.

## 2023-07-30 NOTE — ED Notes (Signed)
 Patient transported to CT

## 2023-07-30 NOTE — ED Notes (Signed)
 Pt returned from CT in position of comfort on locked stretcher with call bell in reach.

## 2023-07-30 NOTE — ED Provider Notes (Signed)
 [place] EMERGENCY DEPARTMENT AT [facility] Provider Note   CSN: [id number] Arrival date & time: 07/30/23  1750     History  Chief Complaint  Patient presents with   Emesis    Alan Hurst is a 78 y.o. male.  HPI     78 year old male with a history of metastatic prostate cancer, COPD, diabetes, hyperlipidemia, prior history of alcoholism, CKD, who presents with concern for nausea and vomiting and was found to have a fever.  Reports he had a cough starting last night and today had nausea and vomiting more than 5 episodes since this afternoon.  He was not aware of a fever until he got here.  He had a fever to 101.3 with EMS.  Feels generally weak, low appetite.  Denies diarrhea.  Denies headache, chest pain, urinary symptoms.  Has shortness of breath and wheezing. 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 Authorizing Provider  abiraterone acetate (ZYTIGA) 250 MG tablet Take 1,000 mg by mouth daily. 09/16/20  Yes [provider]   Ascorbic Acid (VITAMIN C) 1000 MG tablet Take 1,000 mg by mouth daily.   Yes [provider]  aspirin EC 81 MG tablet Take 81 mg by mouth daily.   Yes [provider]  azithromycin (ZITHROMAX) 250 MG tablet Take 250 mg by mouth every other day.   Yes [provider]  beclomethasone (QVAR REDIHALER) 40 MCG/ACT inhaler Inhale 1 puff into the lungs 2 (two) times daily. 07/30/22  Yes Saralyn Pilar A, PA  Cholecalciferol (VITAMIN D) 2000 units tablet Take 2,000 Units by mouth daily. 04/25/13  Yes [provider]  [place] Lanius (OMEGA-3) 500 MG CAPS Take 1 capsule by mouth daily. 09/26/20  Yes [provider]  levalbuterol (XOPENEX HFA) 45 MCG/ACT inhaler Inhale 2 puffs into the lungs every 4 (four) hours as needed for wheezing or shortness of breath.   Yes Marisue Brooklyn, MD  losartan (COZAAR) 25 MG tablet Take 1 tablet (25 mg total) by mouth daily. 07/14/23  Yes Little Ishikawa, MD  lovastatin (MEVACOR) 40 MG tablet TAKE 1 TABLET BY MOUTH AT  BEDTIME 05/10/23  Yes Saralyn Pilar A, PA  magnesium oxide (MAG-OX) 400 MG tablet Take 400 mg by mouth daily.   Yes [provider]  Melatonin 10 MG TABS Take 10 mg by mouth at bedtime.   Yes [provider]  metFORMIN (GLUCOPHAGE) 500 MG tablet TAKE 1  TABLET BY MOUTH DAILY 04/15/23  Yes Saralyn Pilar A, PA  metoprolol succinate (TOPROL-XL) 25 MG 24 hr tablet Take 1 tablet (25 mg total) by mouth daily. 07/15/23  Yes Saralyn Pilar A, PA  omeprazole (PRILOSEC) 20 MG capsule Take 1 capsule (20 mg total) by mouth daily. 12/15/22  Yes Saralyn Pilar A, PA  Tiotropium Bromide-Olodaterol (STIOLTO RESPIMAT) 2.5-2.5 MCG/ACT AERS Inhale 1 puff into the lungs daily at 12 noon.   Yes [provider]  vitamin B-12 (CYANOCOBALAMIN) 500 MCG tablet Take 500 mcg by mouth daily.   Yes [provider]  furosemide (LASIX) 40 MG tablet Take 1 tablet (40 mg total) by mouth as needed. Please take  daily as needed for greater than 3 lbs a day or 5 lbs in a week Patient not taking: Reported on 08/01/2023 06/08/23 09/06/23  Little Ishikawa, MD      Allergies    Patient has no known allergies.    Review of Systems   Review of Systems  Physical Exam Updated Vital Signs BP 125/78 (BP Location: Left Arm)   Pulse (!) 111   Temp 98.4 F (36.9 C) (Oral)   Resp 18   Ht 5\' 9"  (1.753 m)   Wt 81.6 kg   SpO2 95%   BMI 26.58 kg/m  Physical Exam Vitals and nursing note reviewed.  Constitutional:      General: He is not in acute distress.    Appearance: He is well-developed. He is ill-appearing. He is not diaphoretic.  HENT:     Head: Normocephalic and atraumatic.  Eyes:     Conjunctiva/sclera: Conjunctivae normal.  Cardiovascular:     Rate and Rhythm: Normal rate and regular rhythm.     Heart sounds: Normal heart sounds. No murmur heard.    No friction rub. No gallop.  Pulmonary:     Effort: Pulmonary effort is normal. Tachypnea present. No respiratory distress.     Breath sounds: Normal breath sounds. No wheezing or rales.     Comments: Decreased breath sounds  Abdominal:     General: There is distension.     Palpations: Abdomen is soft.     Tenderness: There is no abdominal tenderness. There is no guarding.  Musculoskeletal:     Cervical back: Normal range of motion.  Skin:    General: Skin is warm and dry.  Neurological:     Mental Status: He is alert and oriented to person, place, and time.     ED Results / Procedures / Treatments   Labs (all labs ordered are listed, but only abnormal results are displayed) Labs Reviewed  COMPREHENSIVE METABOLIC PANEL - Abnormal; Notable for the following components:      Result Value   Potassium 3.3 (*)    CO2 17 (*)    Glucose, Bld 142 (*)    BUN 25 (*)    Creatinine, Ser 1.35 (*)    GFR, Estimated 54 (*)    All other components within normal limits  CBC WITH DIFFERENTIAL/PLATELET - Abnormal; Notable for the  following components:   Lymphs Abs 0.2 (*)    All other components within normal limits  URINALYSIS, W/ REFLEX TO CULTURE (INFECTION SUSPECTED) - Abnormal; Notable for the following components:   Hgb urine dipstick SMALL (*)    Ketones, ur 15 (*)    Bacteria, UA RARE (*)    All other components within normal limits  CBC - Abnormal; Notable for the following components:   RBC 4.12 (*)  Hemoglobin 11.4 (*)    HCT 34.6 (*)    All other components within normal limits  BASIC METABOLIC PANEL - Abnormal; Notable for the following components:   CO2 18 (*)    Glucose, Bld 163 (*)    BUN 25 (*)    Creatinine, Ser 1.31 (*)    Calcium 8.4 (*)    GFR, Estimated 56 (*)    All other components within normal limits  PHOSPHORUS - Abnormal; Notable for the following components:   Phosphorus 2.2 (*)    All other components within normal limits  BASIC METABOLIC PANEL - Abnormal; Notable for the following components:   Potassium 3.4 (*)    CO2 19 (*)    Glucose, Bld 101 (*)    BUN 27 (*)    Calcium 8.1 (*)    All other components within normal limits  PHOSPHORUS - Abnormal; Notable for the following components:   Phosphorus 2.3 (*)    All other components within normal limits  CBC - Abnormal; Notable for the following components:   RBC 3.80 (*)    Hemoglobin 10.4 (*)    HCT 31.4 (*)    Platelets 103 (*)    All other components within normal limits  CULTURE, BLOOD (ROUTINE X 2)  CULTURE, BLOOD (ROUTINE X 2)  RESP PANEL BY RT-PCR (RSV, FLU A&B, COVID)  RVPGX2  RESPIRATORY PANEL BY PCR  PROTIME-INR  LACTIC ACID, PLASMA  LACTIC ACID, PLASMA  PROTIME-INR  APTT  MAGNESIUM    EKG EKG Interpretation Date/Time:  Friday July 30 2023 20:11:49 EDT Ventricular Rate:  97 PR Interval:  182 QRS Duration:  89 QT Interval:  356 QTC Calculation: 453 R Axis:   -1  Text Interpretation: Sinus rhythm No significant change since last tracing Confirmed by Alvira Monday ([id number]) on 07/30/2023  11:21:54 PM  Radiology DG Abd Portable 1V-Small Bowel Obstruction Protocol-initial, 8 hr delay Result Date: 08/01/2023 CLINICAL DATA:  8 hour small-bowel delay EXAM: PORTABLE ABDOMEN - 1 VIEW COMPARISON:  CT abdomen and pelvis 07/30/2023 FINDINGS: Contrast material is demonstrated throughout the colon without significant residual small bowel contrast. No small bowel distention. Changes suggest no evidence of small-bowel obstruction. Degenerative changes in the spine. Postoperative changes in the hips. IMPRESSION: Contrast material is demonstrated throughout the colon suggesting no evidence of small-bowel obstruction. Electronically Signed   By: Burman Nieves M.D.   On: 08/01/2023 00:45   VAS [place] LOWER EXTREMITY VENOUS (DVT) Result Date: 07/31/2023  Lower Venous DVT Study Patient Name:  RANDALL COLDEN  Date of Exam:   07/31/2023 Medical Rec #: [id number]        Accession #:    [id number] Date of Birth: 08-07-1945        Patient Gender: M Patient Age:   28 years Exam Location:  [facility] Procedure:      VAS [place] LOWER EXTREMITY VENOUS (DVT) Referring Phys: Enid Derry HALL --------------------------------------------------------------------------------  Indications: Pain, and Swelling.  Risk Factors: Cancer Prostate & lung. Anticoagulation: Lovenox. Comparison Study: None. Performing Technologist: Shona Simpson  Examination Guidelines: A complete evaluation includes B-mode imaging, spectral Doppler, color Doppler, and power Doppler as needed of all accessible portions of each vessel. Bilateral testing is considered an integral part of a complete examination. Limited examinations for reoccurring indications may be performed as noted. The reflux portion of the exam is performed with the patient in reverse Trendelenburg.  +---------+---------------+---------+-----------+----------+--------------+ RIGHT    CompressibilityPhasicitySpontaneityPropertiesThrombus Aging  +---------+---------------+---------+-----------+----------+--------------+ CFV  Full           Yes      Yes                                 +---------+---------------+---------+-----------+----------+--------------+ SFJ      Full                                                        +---------+---------------+---------+-----------+----------+--------------+ FV Prox  Full                                                        +---------+---------------+---------+-----------+----------+--------------+ FV Mid   Full                                                        +---------+---------------+---------+-----------+----------+--------------+ FV DistalFull                                                        +---------+---------------+---------+-----------+----------+--------------+ PFV      Full                                                        +---------+---------------+---------+-----------+----------+--------------+ POP      Full           Yes      Yes                                 +---------+---------------+---------+-----------+----------+--------------+ PTV      Full                                                        +---------+---------------+---------+-----------+----------+--------------+ PERO     Full                                                        +---------+---------------+---------+-----------+----------+--------------+   +----+---------------+---------+-----------+----------+--------------+ LEFTCompressibilityPhasicitySpontaneityPropertiesThrombus Aging +----+---------------+---------+-----------+----------+--------------+ CFV Full           Yes      Yes                                 +----+---------------+---------+-----------+----------+--------------+  Summary: RIGHT: - No evidence of deep vein thrombosis in the lower extremity. No indirect evidence of obstruction proximal to the inguinal  ligament.  - No cystic structure found in the popliteal fossa.  LEFT: - No evidence of common femoral vein obstruction.   *See table(s) above for measurements and observations. Electronically signed by Lemar Livings MD on 07/31/2023 at 9:41:15 AM.    Final    CT Angio Chest PE W and/or Wo Contrast Result Date: 07/30/2023 CLINICAL DATA:  Emesis fever cough EXAM: CT ANGIOGRAPHY CHEST CT ABDOMEN AND PELVIS WITH CONTRAST TECHNIQUE: Multidetector CT imaging of the chest was performed using the standard protocol during bolus administration of intravenous contrast. Multiplanar CT image reconstructions and MIPs were obtained to evaluate the vascular anatomy. Multidetector CT imaging of the abdomen and pelvis was performed using the standard protocol during bolus administration of intravenous contrast. RADIATION DOSE REDUCTION: This exam was performed according to the departmental dose-optimization program which includes automated exposure control, adjustment of the mA and/or kV according to patient size and/or use of iterative reconstruction technique. CONTRAST:  75mL OMNIPAQUE IOHEXOL 350 MG/ML SOLN COMPARISON:  Chest x-ray 07/30/2023, CT chest 04/22/2022, CT chest abdomen pelvis 08/21/2022, PET CT 04/29/2023 FINDINGS: CTA CHEST FINDINGS Cardiovascular: Satisfactory opacification of the pulmonary arteries to the segmental level. No evidence of pulmonary embolism. Mild aortic atherosclerosis. No aneurysm or dissection. Coronary vascular calcification. Normal cardiac size. No significant pericardial effusion. Mediastinum/Nodes: Patent trachea. No thyroid mass. No suspicious lymph nodes. Esophagus within normal limits. Lungs/Pleura: Trace right pleural effusion. Emphysema. Bandlike parenchymal density at the right apex, measures about 5.2 x 2.4 cm on series 4, image 26, probably stable but abuts the pleural surface compared to prior. No acute airspace disease. Musculoskeletal: No acute or suspicious osseous abnormality.  Review of the MIP images confirms the above findings. CT ABDOMEN and PELVIS FINDINGS Hepatobiliary: Distended gallbladder without calcified stone. No biliary dilatation. Pancreas: Unremarkable. No pancreatic ductal dilatation or surrounding inflammatory changes. Spleen: Normal in size without focal abnormality. Adrenals/Urinary Tract: Stranding centered around the bilateral adrenal glands. No mass or hemorrhage at this time. Kidneys show no hydronephrosis. The bladder is partially obscured by artifact from hip hardware Stomach/Bowel: Mild fluid distension of stomach. Fluid-filled proximal small bowel with some areas of distension measuring up to 3.9 cm, no well-defined transition point but relatively decompressed distal small bowel. No acute bowel wall thickening. Vascular/Lymphatic: Aortic atherosclerosis. No enlarged abdominal or pelvic lymph nodes. Retroaortic left renal vein. Reproductive: Prostate is obscured by artifact from hip hardware Other: Negative for pelvic effusion or free air. Small fat containing inguinal hernias Musculoskeletal: No acute osseous abnormality. Multilevel degenerative changes of the spine. Bilateral hip replacements with artifact. Review of the MIP images confirms the above findings. IMPRESSION: 1. Negative for acute pulmonary embolus. 2. Trace right pleural effusion. Emphysema. Bandlike parenchymal density at the right apex is grossly stable but now abuts the pleural surface compared to prior. Continued attention on follow-up imaging recommended. 3. Fluid-filled proximal small bowel with some areas of distension measuring up to 3.9 cm, no well-defined transition point but relatively decompressed distal small bowel. Findings are favored to represent ileus but partial or developing obstruction not excluded. 4. Stranding centered around the bilateral adrenal glands, nonspecific and could be due to adrenal congestion. No evidence for adrenal hemorrhage at this time. 5. Aortic  atherosclerosis. Electronically Signed   By: Jasmine Pang M.D.   On: 07/30/2023 21:57   CT ABDOMEN PELVIS W CONTRAST Result Date: 07/30/2023 CLINICAL  DATA:  Emesis fever cough EXAM: CT ANGIOGRAPHY CHEST CT ABDOMEN AND PELVIS WITH CONTRAST TECHNIQUE: Multidetector CT imaging of the chest was performed using the standard protocol during bolus administration of intravenous contrast. Multiplanar CT image reconstructions and MIPs were obtained to evaluate the vascular anatomy. Multidetector CT imaging of the abdomen and pelvis was performed using the standard protocol during bolus administration of intravenous contrast. RADIATION DOSE REDUCTION: This exam was performed according to the departmental dose-optimization program which includes automated exposure control, adjustment of the mA and/or kV according to patient size and/or use of iterative reconstruction technique. CONTRAST:  75mL OMNIPAQUE IOHEXOL 350 MG/ML SOLN COMPARISON:  Chest x-ray 07/30/2023, CT chest 04/22/2022, CT chest abdomen pelvis 08/21/2022, PET CT 04/29/2023 FINDINGS: CTA CHEST FINDINGS Cardiovascular: Satisfactory opacification of the pulmonary arteries to the segmental level. No evidence of pulmonary embolism. Mild aortic atherosclerosis. No aneurysm or dissection. Coronary vascular calcification. Normal cardiac size. No significant pericardial effusion. Mediastinum/Nodes: Patent trachea. No thyroid mass. No suspicious lymph nodes. Esophagus within normal limits. Lungs/Pleura: Trace right pleural effusion. Emphysema. Bandlike parenchymal density at the right apex, measures about 5.2 x 2.4 cm on series 4, image 26, probably stable but abuts the pleural surface compared to prior. No acute airspace disease. Musculoskeletal: No acute or suspicious osseous abnormality. Review of the MIP images confirms the above findings. CT ABDOMEN and PELVIS FINDINGS Hepatobiliary: Distended gallbladder without calcified stone. No biliary dilatation. Pancreas:  Unremarkable. No pancreatic ductal dilatation or surrounding inflammatory changes. Spleen: Normal in size without focal abnormality. Adrenals/Urinary Tract: Stranding centered around the bilateral adrenal glands. No mass or hemorrhage at this time. Kidneys show no hydronephrosis. The bladder is partially obscured by artifact from hip hardware Stomach/Bowel: Mild fluid distension of stomach. Fluid-filled proximal small bowel with some areas of distension measuring up to 3.9 cm, no well-defined transition point but relatively decompressed distal small bowel. No acute bowel wall thickening. Vascular/Lymphatic: Aortic atherosclerosis. No enlarged abdominal or pelvic lymph nodes. Retroaortic left renal vein. Reproductive: Prostate is obscured by artifact from hip hardware Other: Negative for pelvic effusion or free air. Small fat containing inguinal hernias Musculoskeletal: No acute osseous abnormality. Multilevel degenerative changes of the spine. Bilateral hip replacements with artifact. Review of the MIP images confirms the above findings. IMPRESSION: 1. Negative for acute pulmonary embolus. 2. Trace right pleural effusion. Emphysema. Bandlike parenchymal density at the right apex is grossly stable but now abuts the pleural surface compared to prior. Continued attention on follow-up imaging recommended. 3. Fluid-filled proximal small bowel with some areas of distension measuring up to 3.9 cm, no well-defined transition point but relatively decompressed distal small bowel. Findings are favored to represent ileus but partial or developing obstruction not excluded. 4. Stranding centered around the bilateral adrenal glands, nonspecific and could be due to adrenal congestion. No evidence for adrenal hemorrhage at this time. 5. Aortic atherosclerosis. Electronically Signed   By: Jasmine Pang M.D.   On: 07/30/2023 21:57   DG Chest Port 1 View Result Date: 07/30/2023 CLINICAL DATA:  Questionable sepsis - evaluate for  abnormality EXAM: PORTABLE CHEST 1 VIEW COMPARISON:  Chest x-ray 04/22/2022 FINDINGS: The heart and mediastinal contours are unchanged. Atherosclerotic plaque. Left base atelectasis. No focal consolidation. No pulmonary edema. No pleural effusion. No pneumothorax. No acute osseous abnormality. IMPRESSION: 1. No active disease. 2.  Aortic Atherosclerosis (ICD10-I70.0). Electronically Signed   By: Tish Frederickson M.D.   On: 07/30/2023 19:46    Procedures Procedures    Medications Ordered in ED Medications  enoxaparin (LOVENOX) injection 40 mg (40 mg Subcutaneous Given 07/31/23 1816)  lactated ringers 1,000 mL with potassium chloride 10 mEq infusion ( Intravenous Stopped 08/01/23 0739)  acetaminophen (TYLENOL) tablet 650 mg (has no administration in time range)  prochlorperazine (COMPAZINE) injection 5 mg (has no administration in time range)  melatonin tablet 5 mg (has no administration in time range)  polyethylene glycol (MIRALAX / GLYCOLAX) packet 17 g (has no administration in time range)  senna-docusate (Senokot-S) tablet 1 tablet (1 tablet Oral Not Given 07/31/23 2129)  abiraterone acetate (ZYTIGA) tablet 1,000 mg (has no administration in time range)  levalbuterol (XOPENEX) nebulizer solution 0.63 mg (0.63 mg Inhalation Given 08/01/23 0457)  acetaminophen (TYLENOL) tablet 1,000 mg (1,000 mg Oral Given 07/30/23 2007)  cefTRIAXone (ROCEPHIN) 1 g in sodium chloride 0.9 % 100 mL IVPB (0 g Intravenous Stopped 07/30/23 2134)  ipratropium-albuterol (DUONEB) 0.5-2.5 (3) MG/3ML nebulizer solution 3 mL ( Nebulization Given 07/30/23 2025)  ondansetron (ZOFRAN) injection 4 mg (4 mg Intravenous Given 07/30/23 2025)  iohexol (OMNIPAQUE) 350 MG/ML injection 75 mL (75 mLs Intravenous Contrast Given 07/30/23 2125)  lactated ringers bolus 500 mL (0 mLs Intravenous Stopped 07/31/23 0054)  methylPREDNISolone sodium succinate (SOLU-MEDROL) 125 mg/2 mL injection 125 mg (125 mg Intravenous Given 07/31/23 0100)   metroNIDAZOLE (FLAGYL) IVPB 500 mg (0 mg Intravenous Stopped 07/31/23 0230)  lactated ringers bolus 1,000 mL (0 mLs Intravenous Stopped 07/31/23 0230)  vancomycin (VANCOREADY) IVPB 1500 mg/300 mL (0 mg Intravenous Stopped 07/31/23 0500)  diatrizoate meglumine-sodium (GASTROGRAFIN) 66-10 % solution 90 mL (90 mLs Per NG tube Given 07/31/23 1622)    ED Course/ Medical Decision Making/ A&P                                   78 year old male with a history of metastatic prostate cancer, COPD, diabetes, hyperlipidemia, prior history of alcoholism, CKD, who presents with concern for cough, nausea and vomiting and was found to have a fever.  DDx includes PE, pneumonia, intraabdominal infection, SBO, UTI, ACS.  Normal mentation, no headache, low suspicion for intracranial etiology of symptoms or meningitis at this time. No chest pain, with fever decreased suspicion for ACS as etiology of symptoms.   CT PE study ordered given tachypnea, cancer history, cough and shows a trace right pleural effusion, parenchymal density is stable from prior.  CT abdomen and pelvis ordered given nausea, vomiting and fever shows fluid-filled proximal small bowel which may represent ileus or developing obstruction, and stranding around the bilateral adrenal glands without signs of hemorrhage  Labs completed and personally about interpreted by me show no leukocytosis, no anemia, normal INR, negative COVID/influenza/RSV testing.  CMP with mild hypokalemia, mildly decreased bicarb, creatinine near baseline, no transaminitis.  Urinalysis without infection.    Unclear source of fever-initially was given rocephin as awaiting urine/chest imaging results-added flagyl and vancomycin for more broad coverage with no clear source of fever found and blood cx pending. Given solumedrol/duoneb for COPD exacerbation, 1500c LR.  RVP ordered and pending. Will admit for continued care of fever of unknown etiology, possible SBO vs  ileus.         Final Clinical Impression(s) / ED Diagnoses Final diagnoses:  Fever, unspecified fever cause  Nausea and vomiting, unspecified vomiting type  SBO (small bowel obstruction) (HCC)  Acute cough  COPD exacerbation (HCC)    Rx / DC Orders ED Discharge Orders     None  Alvira Monday, MD 08/01/23 [phone]

## 2023-07-30 NOTE — ED Notes (Signed)
 Lab called to notify blood sample clotted & will reorder PTT

## 2023-07-30 NOTE — ED Triage Notes (Signed)
 Pt bib ems from home c/o emesis. Pt has been vomiting since lunch time. Pt was hot to touch and was given 650 mg Tylenol. 101.3 F and pt has a cough that started yesterday. Family states pt felt fine yesterday. Notice lung sounds on left side that may be rhonchi. Pt states he vomited a bunch of times.   BP 176/90 CBG 197 RA 93% HR 100

## 2023-07-30 NOTE — ED Notes (Signed)
 Brother Aneta Mins called for update, told him nurse would have to get consent from pt in order to give a update because he wasn't in [facility].

## 2023-07-30 NOTE — ED Notes (Signed)
 EKG completed

## 2023-07-30 NOTE — ED Notes (Signed)
 Pt had an episode of vomiting & became SOB afterwards. Provider made aware, new order for EKG placed. Provider to see pt.

## 2023-07-30 NOTE — ED Notes (Signed)
 Provider at bedside with new orders.

## 2023-07-31 ENCOUNTER — Other Ambulatory Visit: Payer: Self-pay

## 2023-07-31 ENCOUNTER — Observation Stay (HOSPITAL_BASED_OUTPATIENT_CLINIC_OR_DEPARTMENT_OTHER)

## 2023-07-31 DIAGNOSIS — Z7982 Long term (current) use of aspirin: Secondary | ICD-10-CM | POA: Diagnosis not present

## 2023-07-31 DIAGNOSIS — R933 Abnormal findings on diagnostic imaging of other parts of digestive tract: Secondary | ICD-10-CM | POA: Diagnosis not present

## 2023-07-31 DIAGNOSIS — Z1152 Encounter for screening for COVID-19: Secondary | ICD-10-CM | POA: Diagnosis not present

## 2023-07-31 DIAGNOSIS — Z8249 Family history of ischemic heart disease and other diseases of the circulatory system: Secondary | ICD-10-CM | POA: Diagnosis not present

## 2023-07-31 DIAGNOSIS — Z85118 Personal history of other malignant neoplasm of bronchus and lung: Secondary | ICD-10-CM | POA: Diagnosis not present

## 2023-07-31 DIAGNOSIS — E1122 Type 2 diabetes mellitus with diabetic chronic kidney disease: Secondary | ICD-10-CM | POA: Diagnosis present

## 2023-07-31 DIAGNOSIS — K56609 Unspecified intestinal obstruction, unspecified as to partial versus complete obstruction: Secondary | ICD-10-CM | POA: Diagnosis present

## 2023-07-31 DIAGNOSIS — I252 Old myocardial infarction: Secondary | ICD-10-CM | POA: Diagnosis not present

## 2023-07-31 DIAGNOSIS — F1021 Alcohol dependence, in remission: Secondary | ICD-10-CM | POA: Diagnosis present

## 2023-07-31 DIAGNOSIS — Z79899 Other long term (current) drug therapy: Secondary | ICD-10-CM | POA: Diagnosis not present

## 2023-07-31 DIAGNOSIS — Z87891 Personal history of nicotine dependence: Secondary | ICD-10-CM | POA: Diagnosis not present

## 2023-07-31 DIAGNOSIS — E872 Acidosis, unspecified: Secondary | ICD-10-CM | POA: Diagnosis present

## 2023-07-31 DIAGNOSIS — R112 Nausea with vomiting, unspecified: Secondary | ICD-10-CM | POA: Diagnosis not present

## 2023-07-31 DIAGNOSIS — E876 Hypokalemia: Secondary | ICD-10-CM | POA: Diagnosis present

## 2023-07-31 DIAGNOSIS — M7989 Other specified soft tissue disorders: Secondary | ICD-10-CM | POA: Diagnosis not present

## 2023-07-31 DIAGNOSIS — J441 Chronic obstructive pulmonary disease with (acute) exacerbation: Secondary | ICD-10-CM | POA: Diagnosis present

## 2023-07-31 DIAGNOSIS — I129 Hypertensive chronic kidney disease with stage 1 through stage 4 chronic kidney disease, or unspecified chronic kidney disease: Secondary | ICD-10-CM | POA: Diagnosis present

## 2023-07-31 DIAGNOSIS — Z7984 Long term (current) use of oral hypoglycemic drugs: Secondary | ICD-10-CM | POA: Diagnosis not present

## 2023-07-31 DIAGNOSIS — A084 Viral intestinal infection, unspecified: Secondary | ICD-10-CM | POA: Diagnosis present

## 2023-07-31 DIAGNOSIS — C61 Malignant neoplasm of prostate: Secondary | ICD-10-CM | POA: Diagnosis present

## 2023-07-31 DIAGNOSIS — N1831 Chronic kidney disease, stage 3a: Secondary | ICD-10-CM | POA: Diagnosis present

## 2023-07-31 DIAGNOSIS — K219 Gastro-esophageal reflux disease without esophagitis: Secondary | ICD-10-CM | POA: Diagnosis present

## 2023-07-31 DIAGNOSIS — E785 Hyperlipidemia, unspecified: Secondary | ICD-10-CM | POA: Diagnosis present

## 2023-07-31 DIAGNOSIS — Z923 Personal history of irradiation: Secondary | ICD-10-CM | POA: Diagnosis not present

## 2023-07-31 DIAGNOSIS — Z7951 Long term (current) use of inhaled steroids: Secondary | ICD-10-CM | POA: Diagnosis not present

## 2023-07-31 DIAGNOSIS — C7800 Secondary malignant neoplasm of unspecified lung: Secondary | ICD-10-CM | POA: Diagnosis present

## 2023-07-31 LAB — RESPIRATORY PANEL BY PCR

## 2023-07-31 LAB — BASIC METABOLIC PANEL
Anion gap: 11 (ref 5–15)
BUN: 25 mg/dL — ABNORMAL HIGH (ref 8–23)
CO2: 18 mmol/L — ABNORMAL LOW (ref 22–32)
Calcium: 8.4 mg/dL — ABNORMAL LOW (ref 8.9–10.3)
Chloride: 108 mmol/L (ref 98–111)
Creatinine, Ser: 1.31 mg/dL — ABNORMAL HIGH (ref 0.61–1.24)
GFR, Estimated: 56 mL/min — ABNORMAL LOW (ref >60)
Glucose, Bld: 163 mg/dL — ABNORMAL HIGH (ref 70–99)
Potassium: 3.6 mmol/L (ref 3.5–5.1)
Sodium: 137 mmol/L (ref 135–145)

## 2023-07-31 LAB — CBC
HCT: 34.6 % — ABNORMAL LOW (ref 39.0–52.0)
Hemoglobin: 11.4 g/dL — ABNORMAL LOW (ref 13.0–17.0)
MCH: 27.7 pg (ref 26.0–34.0)
MCHC: 32.9 g/dL (ref 30.0–36.0)
MCV: 84 fL (ref 80.0–100.0)
Platelets: 157 10*3/uL (ref 150–400)
RBC: 4.12 MIL/uL — ABNORMAL LOW (ref 4.22–5.81)
RDW: 14.6 % (ref 11.5–15.5)
WBC: 6 10*3/uL (ref 4.0–10.5)
nRBC: 0 % (ref 0.0–0.2)

## 2023-07-31 LAB — LACTIC ACID, PLASMA: Lactic Acid, Venous: 0.8 mmol/L (ref 0.5–1.9)

## 2023-07-31 LAB — PHOSPHORUS: Phosphorus: 2.2 mg/dL — ABNORMAL LOW (ref 2.5–4.6)

## 2023-07-31 LAB — MAGNESIUM: Magnesium: 1.8 mg/dL (ref 1.7–2.4)

## 2023-07-31 MED ORDER — ABIRATERONE ACETATE 250 MG PO TABS: 1000.0000 mg | ORAL_TABLET | Freq: Every day | ORAL | Status: DC

## 2023-07-31 MED ORDER — PROCHLORPERAZINE EDISYLATE 10 MG/2ML IJ SOLN: 5.0000 mg | Freq: Four times a day (QID) | INTRAMUSCULAR | Status: DC | PRN

## 2023-07-31 MED ORDER — POTASSIUM CHLORIDE 2 MEQ/ML IV SOLN
INTRAVENOUS | Status: AC
  Filled 2023-07-31 (×2): qty 1000

## 2023-07-31 MED ORDER — LEVALBUTEROL HCL 0.63 MG/3ML IN NEBU
0.6300 mg | INHALATION_SOLUTION | RESPIRATORY_TRACT | Status: DC | PRN
  Administered 2023-07-31 – 2023-08-01 (×3): 0.63 mg via RESPIRATORY_TRACT
  Filled 2023-07-31 (×3): qty 3

## 2023-07-31 MED ORDER — DIATRIZOATE MEGLUMINE & SODIUM 66-10 % PO SOLN
90.0000 mL | Freq: Once | ORAL | Status: AC
  Administered 2023-07-31: 90 mL via NASOGASTRIC
  Filled 2023-07-31: qty 90

## 2023-07-31 MED ORDER — VANCOMYCIN HCL 1500 MG/300ML IV SOLN
1500.0000 mg | Freq: Once | INTRAVENOUS | Status: AC
  Administered 2023-07-31: 1500 mg via INTRAVENOUS
  Filled 2023-07-31: qty 300

## 2023-07-31 MED ORDER — ENOXAPARIN SODIUM 40 MG/0.4ML IJ SOSY
40.0000 mg | PREFILLED_SYRINGE | Freq: Every day | INTRAMUSCULAR | Status: DC
  Administered 2023-07-31 – 2023-08-01 (×2): 40 mg via SUBCUTANEOUS
  Filled 2023-07-31 (×2): qty 0.4

## 2023-07-31 MED ORDER — METHYLPREDNISOLONE SODIUM SUCC 125 MG IJ SOLR
125.0000 mg | Freq: Once | INTRAMUSCULAR | Status: AC
  Administered 2023-07-31: 125 mg via INTRAVENOUS
  Filled 2023-07-31: qty 2

## 2023-07-31 MED ORDER — LACTATED RINGERS IV BOLUS
1000.0000 mL | Freq: Once | INTRAVENOUS | Status: AC
Start: 2023-07-31 — End: 2023-07-31
  Administered 2023-07-31: 1000 mL via INTRAVENOUS

## 2023-07-31 MED ORDER — POLYETHYLENE GLYCOL 3350 17 G PO PACK: 17.0000 g | PACK | Freq: Every day | ORAL | Status: DC | PRN

## 2023-07-31 MED ORDER — ACETAMINOPHEN 325 MG PO TABS: 650.0000 mg | ORAL_TABLET | Freq: Four times a day (QID) | ORAL | Status: DC | PRN

## 2023-07-31 MED ORDER — SENNOSIDES-DOCUSATE SODIUM 8.6-50 MG PO TABS
1.0000 | ORAL_TABLET | Freq: Two times a day (BID) | ORAL | Status: AC
  Administered 2023-07-31: 1 via ORAL
  Filled 2023-07-31: qty 1

## 2023-07-31 MED ORDER — MELATONIN 5 MG PO TABS: 5.0000 mg | ORAL_TABLET | Freq: Every evening | ORAL | Status: DC | PRN

## 2023-07-31 MED ORDER — METRONIDAZOLE 500 MG/100ML IV SOLN
500.0000 mg | Freq: Once | INTRAVENOUS | Status: AC
  Administered 2023-07-31: 500 mg via INTRAVENOUS
  Filled 2023-07-31: qty 100

## 2023-07-31 NOTE — ED Notes (Signed)
Report given to Judith RN.

## 2023-07-31 NOTE — Care Management Obs Status (Signed)
 MEDICARE OBSERVATION STATUS NOTIFICATION   Patient Details  Name: Alan Hurst MRN: [id number] Date of Birth: 1946/01/02   Medicare Observation Status Notification Given:  Yes    Lockie Pares, RN 07/31/2023, 1:04 PM

## 2023-07-31 NOTE — H&P (Addendum)
 History and Physical  Alan Hurst ZOX:[id number] DOB: 06-Dec-1945 DOA: 07/30/2023  Referring physician: Dr. Dalene Seltzer, EDP  PCP: Melida Quitter, PA  Outpatient Specialists: Urology Patient coming from: Home.  Chief Complaint: Nausea vomiting cough.  HPI: Alan Hurst is a 78 y.o. 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  Associated with generalized weakness.  His wife felt nauseous too however her symptoms resolved quickly.  In the ER noted to be febrile.  Chest x-ray was nonacute.  CTA was negative for pulmonary embolism.  CT abdomen and pelvis showed findings suggestive of partial small bowel obstruction versus ileus.  Incidentally noted stranding around adrenal glands with no evidence of hemorrhage.  The patient received IV fluid hydration and IV antiemetics.  Admitted by [facility], hospitalist service.  ED Course: Temperature 101.  BP 111/57, pulse 89, respiratory 23, O2 saturation 91% on room air.  Lab studies notable for serum potassium 3.3, serum bicarb 17, glucose 142, BUN 25, creatinine 1.35, GFR 54.  Review of Systems: Review of systems as noted in the HPI. All other systems reviewed and are negative.   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   [facility]   Recovering alcoholic in remission (HCC) 12/05/2015   No alcohol in many, many years     Sleep disorder 12/05/2015   Vitamin B deficiency 12/05/2015   Vitamin D insufficiency 12/05/2015   Past Surgical History:  Procedure Laterality Date   CATARACT EXTRACTION, BILATERAL  10/2016   COLONOSCOPY     FEMUR FRACTURE SURGERY     HIP FRACTURE SURGERY     JOINT REPLACEMENT Bilateral    hips   LEFT HEART CATH AND CORONARY ANGIOGRAPHY N/A 04/27/2022   Procedure: LEFT HEART CATH AND CORONARY ANGIOGRAPHY;  Surgeon: Kathleene Hazel, MD;  Location: [facility];  Service: Cardiovascular;  Laterality: N/A;   TIBIA FRACTURE SURGERY     TOTAL HIP REVISION Right 08/07/2016   Procedure: RIGHT TOTAL HIP REVISION;  Surgeon: Samson Frederic, MD;  Location: [facility] OR;  Service: Orthopedics;  Laterality: Right;    Social History:  reports that he quit smoking about 30 years ago. His smoking use included cigarettes. He started smoking about 62 years ago. He has a 48 pack-year smoking history. He has never been exposed to tobacco smoke. He has never used smokeless tobacco. He reports that he does not currently use alcohol. He reports that he does not use drugs.   Allergies  Allergen Reactions   No Known Allergies    Flavoring Agent (Non-Screening) Rash    Pt states this is not true.  Family History  Problem Relation Age of Onset   Alcohol abuse Mother    Alcohol abuse Father    Congestive Heart Failure Father    Kidney disease Father    Colon cancer Neg Hx    Esophageal cancer Neg Hx    Rectal cancer Neg Hx    Stomach cancer Neg Hx      Prior to Admission medications   Medication Sig Start Date End Date Taking? Authorizing Provider  abiraterone acetate (ZYTIGA) 250 MG tablet Take 1,000 mg by mouth daily. 09/16/20   [provider]  aspirin EC 81 MG tablet Take 81 mg by mouth daily.     [provider]  beclomethasone (QVAR REDIHALER) 40 MCG/ACT inhaler Inhale 1 puff into the lungs 2 (two) times daily. 07/30/22   Melida Quitter, PA  Cholecalciferol (VITAMIN D) 2000 units tablet Take 1 tablet by mouth daily. 04/25/13   [provider]  furosemide (LASIX) 40 MG tablet Take 1 tablet (40 mg total) by mouth as needed. Please take daily as needed for greater than 3 lbs a day or 5 lbs in a week 06/08/23 09/06/23  Little Ishikawa, MD  Boris Lown Oil (OMEGA-3) 500 MG CAPS Take 1 capsule by mouth daily. 09/26/20   [provider]  levalbuterol Pauline Aus HFA) 45 MCG/ACT inhaler Inhale into the lungs every 4 (four) hours as needed for wheezing.    Marisue Brooklyn, MD  losartan (COZAAR) 25 MG tablet Take 1 tablet (25 mg total) by mouth daily. 07/14/23   Little Ishikawa, MD  lovastatin (MEVACOR) 40 MG tablet TAKE 1 TABLET BY MOUTH AT  BEDTIME 05/10/23   Saralyn Pilar A, PA  magnesium oxide (MAG-OX) 400 MG tablet Take 400 mg by mouth daily.    [provider]  melatonin 5 MG TABS Take 5 mg by mouth at bedtime.    [provider]  metFORMIN (GLUCOPHAGE) 500 MG tablet TAKE 1 TABLET BY MOUTH DAILY 04/15/23   Saralyn Pilar A, PA  metoprolol succinate (TOPROL-XL) 25 MG 24 hr tablet Take 1 tablet (25 mg total) by mouth daily. 07/15/23   Melida Quitter, PA  omeprazole (PRILOSEC) 20 MG capsule Take 1 capsule (20 mg total) by mouth daily. 12/15/22   Melida Quitter, PA  vitamin B-12 (CYANOCOBALAMIN) 500 MCG tablet Take 500 mcg by mouth daily.    [provider]    Physical Exam: BP (!) 106/51   Pulse 95   Temp 98.8 F (37.1 C) (Oral)   Resp (!) 22   Ht 5\' 9"  (1.753 m)   Wt 81.6 kg   SpO2 95%   BMI 26.58 kg/m   General: 78 y.o. year-old male well developed well nourished in no acute distress.  Alert and oriented x3. Cardiovascular: Regular rate and rhythm with no rubs or gallops.  No thyromegaly or JVD noted.  Unilateral  right lower extremity edema.   Respiratory: Clear to auscultation with no wheezes or rales. Good inspiratory effort. Abdomen: Soft nontender nondistended with normal bowel sounds x4 quadrants. Muskuloskeletal: No cyanosis, no clubbing noted bilaterally Neuro: CN II-XII intact, strength, sensation, reflexes Skin: No ulcerative lesions noted or rashes Psychiatry: Judgement and insight appear normal. Mood is appropriate for condition and setting          Labs on Admission:  Basic Metabolic Panel: Recent Labs  Lab 07/30/23 1838  NA 138  K 3.3*  CL 106  CO2 17*  GLUCOSE 142*  BUN 25*  CREATININE 1.35*  CALCIUM 9.2   Liver Function Tests: Recent Labs  Lab 07/30/23 1838  AST 26  ALT 20  ALKPHOS 57  BILITOT 0.7  PROT 6.5  ALBUMIN 3.5   No results for input(s): "LIPASE", "AMYLASE" in the last 168 hours. No results for input(s): "AMMONIA" in the last 168 hours. CBC: Recent Labs  Lab 07/30/23 1838  WBC 5.3  NEUTROABS 4.7  HGB 13.0  HCT 39.7  MCV 84.6  PLT 175   Cardiac Enzymes: No results for input(s): "CKTOTAL", "CKMB", "CKMBINDEX", "TROPONINI" in the last 168 hours.  BNP (last 3 results) No results for input(s): "BNP" in the last 8760 hours.  ProBNP (last 3 results) No results for input(s): "PROBNP" in the last 8760 hours.  CBG: No results for input(s): "GLUCAP" in the last 168 hours.  Radiological Exams on Admission: CT Angio Chest PE W and/or Wo Contrast Result Date: 07/30/2023 CLINICAL DATA:  Emesis fever cough EXAM: CT ANGIOGRAPHY CHEST CT ABDOMEN AND PELVIS WITH CONTRAST TECHNIQUE: Multidetector CT imaging of the chest was performed using the standard protocol during bolus administration of intravenous contrast. Multiplanar CT image reconstructions and MIPs were obtained to evaluate the vascular anatomy. Multidetector CT imaging of the abdomen and pelvis was performed using the standard protocol during bolus administration of intravenous contrast. RADIATION  DOSE REDUCTION: This exam was performed according to the departmental dose-optimization program which includes automated exposure control, adjustment of the mA and/or kV according to patient size and/or use of iterative reconstruction technique. CONTRAST:  75mL OMNIPAQUE IOHEXOL 350 MG/ML SOLN COMPARISON:  Chest x-ray 07/30/2023, CT chest 04/22/2022, CT chest abdomen pelvis 08/21/2022, PET CT 04/29/2023 FINDINGS: CTA CHEST FINDINGS Cardiovascular: Satisfactory opacification of the pulmonary arteries to the segmental level. No evidence of pulmonary embolism. Mild aortic atherosclerosis. No aneurysm or dissection. Coronary vascular calcification. Normal cardiac size. No significant pericardial effusion. Mediastinum/Nodes: Patent trachea. No thyroid mass. No suspicious lymph nodes. Esophagus within normal limits. Lungs/Pleura: Trace right pleural effusion. Emphysema. Bandlike parenchymal density at the right apex, measures about 5.2 x 2.4 cm on series 4, image 26, probably stable but abuts the pleural surface compared to prior. No acute airspace disease. Musculoskeletal: No acute or suspicious osseous abnormality. Review of the MIP images confirms the above findings. CT ABDOMEN and PELVIS FINDINGS Hepatobiliary: Distended gallbladder without calcified stone. No biliary dilatation. Pancreas: Unremarkable. No pancreatic ductal dilatation or surrounding inflammatory changes. Spleen: Normal in size without focal abnormality. Adrenals/Urinary Tract: Stranding centered around the bilateral adrenal glands. No mass or hemorrhage at this time. Kidneys show no hydronephrosis. The bladder is partially obscured by artifact from hip hardware Stomach/Bowel: Mild fluid distension of stomach. Fluid-filled proximal small bowel with some areas of distension measuring up to 3.9 cm, no well-defined transition point but relatively decompressed distal small bowel. No acute bowel wall thickening. Vascular/Lymphatic: Aortic atherosclerosis.  No enlarged abdominal or pelvic lymph nodes. Retroaortic left renal vein. Reproductive: Prostate is obscured by artifact from hip hardware Other: Negative for pelvic effusion or free air. Small fat containing inguinal hernias Musculoskeletal: No acute osseous abnormality. Multilevel degenerative changes of the spine. Bilateral hip replacements with artifact. Review of the MIP images confirms the above findings. IMPRESSION: 1. Negative for acute pulmonary embolus. 2. Trace right pleural effusion. Emphysema. Bandlike parenchymal density at the right apex is grossly stable but now abuts the pleural surface compared to prior. Continued attention on follow-up imaging recommended. 3. Fluid-filled proximal small bowel with some areas of distension measuring up to 3.9 cm,  no well-defined transition point but relatively decompressed distal small bowel. Findings are favored to represent ileus but partial or developing obstruction not excluded. 4. Stranding centered around the bilateral adrenal glands, nonspecific and could be due to adrenal congestion. No evidence for adrenal hemorrhage at this time. 5. Aortic atherosclerosis. Electronically Signed   By: Jasmine Pang M.D.   On: 07/30/2023 21:57   CT ABDOMEN PELVIS W CONTRAST Result Date: 07/30/2023 CLINICAL DATA:  Emesis fever cough EXAM: CT ANGIOGRAPHY CHEST CT ABDOMEN AND PELVIS WITH CONTRAST TECHNIQUE: Multidetector CT imaging of the chest was performed using the standard protocol during bolus administration of intravenous contrast. Multiplanar CT image reconstructions and MIPs were obtained to evaluate the vascular anatomy. Multidetector CT imaging of the abdomen and pelvis was performed using the standard protocol during bolus administration of intravenous contrast. RADIATION DOSE REDUCTION: This exam was performed according to the departmental dose-optimization program which includes automated exposure control, adjustment of the mA and/or kV according to patient  size and/or use of iterative reconstruction technique. CONTRAST:  75mL OMNIPAQUE IOHEXOL 350 MG/ML SOLN COMPARISON:  Chest x-ray 07/30/2023, CT chest 04/22/2022, CT chest abdomen pelvis 08/21/2022, PET CT 04/29/2023 FINDINGS: CTA CHEST FINDINGS Cardiovascular: Satisfactory opacification of the pulmonary arteries to the segmental level. No evidence of pulmonary embolism. Mild aortic atherosclerosis. No aneurysm or dissection. Coronary vascular calcification. Normal cardiac size. No significant pericardial effusion. Mediastinum/Nodes: Patent trachea. No thyroid mass. No suspicious lymph nodes. Esophagus within normal limits. Lungs/Pleura: Trace right pleural effusion. Emphysema. Bandlike parenchymal density at the right apex, measures about 5.2 x 2.4 cm on series 4, image 26, probably stable but abuts the pleural surface compared to prior. No acute airspace disease. Musculoskeletal: No acute or suspicious osseous abnormality. Review of the MIP images confirms the above findings. CT ABDOMEN and PELVIS FINDINGS Hepatobiliary: Distended gallbladder without calcified stone. No biliary dilatation. Pancreas: Unremarkable. No pancreatic ductal dilatation or surrounding inflammatory changes. Spleen: Normal in size without focal abnormality. Adrenals/Urinary Tract: Stranding centered around the bilateral adrenal glands. No mass or hemorrhage at this time. Kidneys show no hydronephrosis. The bladder is partially obscured by artifact from hip hardware Stomach/Bowel: Mild fluid distension of stomach. Fluid-filled proximal small bowel with some areas of distension measuring up to 3.9 cm, no well-defined transition point but relatively decompressed distal small bowel. No acute bowel wall thickening. Vascular/Lymphatic: Aortic atherosclerosis. No enlarged abdominal or pelvic lymph nodes. Retroaortic left renal vein. Reproductive: Prostate is obscured by artifact from hip hardware Other: Negative for pelvic effusion or free air.  Small fat containing inguinal hernias Musculoskeletal: No acute osseous abnormality. Multilevel degenerative changes of the spine. Bilateral hip replacements with artifact. Review of the MIP images confirms the above findings. IMPRESSION: 1. Negative for acute pulmonary embolus. 2. Trace right pleural effusion. Emphysema. Bandlike parenchymal density at the right apex is grossly stable but now abuts the pleural surface compared to prior. Continued attention on follow-up imaging recommended. 3. Fluid-filled proximal small bowel with some areas of distension measuring up to 3.9 cm, no well-defined transition point but relatively decompressed distal small bowel. Findings are favored to represent ileus but partial or developing obstruction not excluded. 4. Stranding centered around the bilateral adrenal glands, nonspecific and could be due to adrenal congestion. No evidence for adrenal hemorrhage at this time. 5. Aortic atherosclerosis. Electronically Signed   By: Jasmine Pang M.D.   On: 07/30/2023 21:57   DG Chest Port 1 View Result Date: 07/30/2023 CLINICAL DATA:  Questionable sepsis - evaluate for abnormality  EXAM: PORTABLE CHEST 1 VIEW COMPARISON:  Chest x-ray 04/22/2022 FINDINGS: The heart and mediastinal contours are unchanged. Atherosclerotic plaque. Left base atelectasis. No focal consolidation. No pulmonary edema. No pleural effusion. No pneumothorax. No acute osseous abnormality. IMPRESSION: 1. No active disease. 2.  Aortic Atherosclerosis (ICD10-I70.0). Electronically Signed   By: Tish Frederickson M.D.   On: 07/30/2023 19:46    EKG: I independently viewed the EKG done and my findings are as followed: Sinus rhythm rate of 97.  Nonspecific ST-T changes QTc 453  Assessment/Plan Present on Admission:  Intractable nausea and vomiting  Principal Problem:   Intractable nausea and vomiting  Intractable nausea and vomiting, suspect viral gastroenteritis. Continue supportive care IV fluid hydration IV  antiemetics Clear liquid diet, advance as tolerated Replace electrolytes as indicated.  Possible partial small bowel obstruction versus ileus seen on CT scan Positive for flatulence, no bowel movement in the past 2 days. Continue IV fluid, optimize magnesium and potassium levels Early mobilization Bowel regimen in place  Unilateral right lower extremity edema Follow duplex ultrasound to rule out DVT Elevate lower extremities  Metastatic prostate cancer Resume home Zytiga  Hypokalemia Repleted intravenously with LR KCl 10 mill equivalent at 50 cc p.o. x 1 day Repeat BMP and check magnesium level  CKD 3 A Appears to be close to baseline Avoid nephrotoxic agents, dehydration, and hypotension Monitor urine output Repeat BMP in the morning  Non anion gap metabolic acidosis Serum bicarb 17, anion gap 15 Continue gentle IV fluid hydration Repeat BMP in the morning.  Generalized weakness PT OT assessment Fall precautions.    Time: 75 minutes.    DVT prophylaxis: Subcu Lovenox daily  Code Status: Full code.  Family Communication: None at bedside.  Disposition Plan: Admitted to telemetry medical unit.  Consults called: None.  Admission status: Observation status.   Status is: Observation    Darlin Drop MD [facility] Pager [phone]  If 7PM-7AM, please contact night-coverage [email or web address] Password [facility]  07/31/2023, 12:37 AM

## 2023-07-31 NOTE — Consult Note (Signed)
 Reason for Consult:SBO vs ileus Referring Physician: [name] is an 78 y.o. male.  HPI: 78yo M with PMHx as below was admitted by the hospitalist service yesterday after multiple episodes of vomiting.  CT scan done in the emergency department showed ileus versus SBO but no transition zone was noted.  He is not having any abdominal pain.  No more nausea and vomiting.  No flatus or bowel movement since he has been at the hospital.  I was asked to see him from a surgical standpoint.  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   [facility]   Recovering alcoholic in remission (HCC) 12/05/2015   No alcohol in many, many years     Sleep disorder 12/05/2015   Vitamin B deficiency 12/05/2015   Vitamin D insufficiency 12/05/2015    Past Surgical History:  Procedure Laterality Date   CATARACT EXTRACTION, BILATERAL  10/2016   COLONOSCOPY     FEMUR FRACTURE SURGERY     HIP FRACTURE SURGERY     JOINT REPLACEMENT Bilateral    hips   LEFT HEART CATH AND CORONARY ANGIOGRAPHY N/A 04/27/2022   Procedure: LEFT HEART CATH AND CORONARY ANGIOGRAPHY;  Surgeon: Kathleene Hazel, MD;  Location: [facility];  Service: Cardiovascular;  Laterality: N/A;   TIBIA FRACTURE SURGERY     TOTAL HIP REVISION Right 08/07/2016   Procedure: RIGHT TOTAL HIP  REVISION;  Surgeon: Samson Frederic, MD;  Location: [facility] OR;  Service: Orthopedics;  Laterality: Right;    Family History  Problem Relation Age of Onset   Alcohol abuse Mother    Alcohol abuse Father    Congestive Heart Failure Father    Kidney disease Father    Colon cancer Neg Hx    Esophageal cancer Neg Hx    Rectal cancer Neg Hx    Stomach cancer Neg Hx     Social History:  reports that he quit smoking about 30 years ago. His smoking use included cigarettes. He started smoking about 62 years ago. He has a 48 pack-year smoking history. He has never been exposed to tobacco smoke. He has never used smokeless tobacco. He reports that he does not currently use alcohol. He reports that he does not use drugs.  Allergies:  Allergies  Allergen Reactions   No Known Allergies    Flavoring Agent (Non-Screening) Rash    Pt states this is not true.    Medications: I have reviewed the patient's current medications.  Results for orders placed or performed during the hospital encounter of 07/30/23 (from the past 48 hours)  Comprehensive metabolic panel     Status: Abnormal   Collection Time: 07/30/23  6:38 PM  Result Value Ref Range   Sodium 138 135 - 145 mmol/L  Potassium 3.3 (L) 3.5 - 5.1 mmol/L   Chloride 106 98 - 111 mmol/L   CO2 17 (L) 22 - 32 mmol/L   Glucose, Bld 142 (H) 70 - 99 mg/dL    Comment: Glucose reference range applies only to samples taken after fasting for at least 8 hours.   BUN 25 (H) 8 - 23 mg/dL   Creatinine, Ser 4.09 (H) 0.61 - 1.24 mg/dL   Calcium 9.2 8.9 - 81.1 mg/dL   Total Protein 6.5 6.5 - 8.1 g/dL   Albumin 3.5 3.5 - 5.0 g/dL   AST 26 15 - 41 U/L   ALT 20 0 - 44 U/L   Alkaline Phosphatase 57 38 - 126 U/L   Total Bilirubin 0.7 0.0 - 1.2 mg/dL   GFR, Estimated 54 (L) >60 mL/min    Comment: (NOTE) Calculated using the CKD-EPI Creatinine Equation (2021)    Anion gap 15 5 - 15    Comment: Performed at [facility], [address] [place], [place]  [address]  CBC with Differential     Status: Abnormal   Collection Time: 07/30/23  6:38 PM  Result Value Ref Range   WBC 5.3 4.0 - 10.5 K/uL   RBC 4.69 4.22 - 5.81 MIL/uL   Hemoglobin 13.0 13.0 - 17.0 g/dL   HCT 29.5 62.1 - 30.8 %   MCV 84.6 80.0 - 100.0 fL   MCH 27.7 26.0 - 34.0 pg   MCHC 32.7 30.0 - 36.0 g/dL   RDW 65.7 84.6 - 96.2 %   Platelets 175 150 - 400 K/uL   nRBC 0.0 0.0 - 0.2 %   Neutrophils Relative % 90 %   Neutro Abs 4.7 1.7 - 7.7 K/uL   Lymphocytes Relative 3 %   Lymphs Abs 0.2 (L) 0.7 - 4.0 K/uL   Monocytes Relative 2 %   Monocytes Absolute 0.1 0.1 - 1.0 K/uL   Eosinophils Relative 5 %   Eosinophils Absolute 0.3 0.0 - 0.5 K/uL   Basophils Relative 0 %   Basophils Absolute 0.0 0.0 - 0.1 K/uL   Immature Granulocytes 0 %   Abs Immature Granulocytes 0.02 0.00 - 0.07 K/uL    Comment: Performed at [facility], [address] [place], [place] [address]  Protime-INR     Status: None   Collection Time: 07/30/23  6:38 PM  Result Value Ref Range   Prothrombin Time 14.6 11.4 - 15.2 seconds    Comment:  SPECIMEN IS CLOTTED CALLED TO ASHLEY BRADSHAW RN @2040  ON 07/30/23 BY MAB   INR 1.1 0.8 - 1.2    Comment:  SPECIMEN IS CLOTTED CALLED TO ASHLEY BRADSHAW RN @2040  ON 07/30/23 BY MAB (NOTE) INR goal varies based on device and disease states. Performed at [facility], [address] [place], [place] [address]   Blood Culture (routine x 2)     Status: None (Preliminary result)   Collection Time: 07/30/23  6:38 PM   Specimen: BLOOD RIGHT ARM  Result Value Ref Range   Specimen Description BLOOD RIGHT ARM    Special Requests      BOTTLES DRAWN AEROBIC AND ANAEROBIC Blood Culture adequate volume   Culture      NO GROWTH < 12 HOURS Performed at [facility], [address], [place], [place] [address]    Report Status PENDING   Resp panel by RT-PCR (RSV, Flu A&B, Covid) Anterior Nasal Swab     Status: None  Collection Time: 07/30/23  6:38 PM   Specimen:  Anterior Nasal Swab  Result Value Ref Range   SARS Coronavirus 2 by RT PCR NEGATIVE NEGATIVE   Influenza A by PCR NEGATIVE NEGATIVE   Influenza B by PCR NEGATIVE NEGATIVE    Comment: (NOTE) The Xpert Xpress SARS-CoV-2/FLU/RSV plus assay is intended as an aid in the diagnosis of influenza from Nasopharyngeal swab specimens and should not be used as a sole basis for treatment. Nasal washings and aspirates are unacceptable for Xpert Xpress SARS-CoV-2/FLU/RSV testing.  Fact Sheet for Patients: BloggerCourse.com  Fact Sheet for Healthcare Providers: SeriousBroker.it  This test is not yet approved or cleared by the [place] FDA and has been authorized for detection and/or diagnosis of SARS-CoV-2 by FDA under an Emergency Use Authorization (EUA). This EUA will remain in effect (meaning this test can be used) for the duration of the COVID-19 declaration under Section 564(b)(1) of the Act, 21 U.S.C. section 360bbb-3(b)(1), unless the authorization is terminated or revoked.     Resp Syncytial Virus by PCR NEGATIVE NEGATIVE    Comment: (NOTE) Fact Sheet for Patients: BloggerCourse.com  Fact Sheet for Healthcare Providers: SeriousBroker.it  This test is not yet approved or cleared by the [place] FDA and has been authorized for detection and/or diagnosis of SARS-CoV-2 by FDA under an Emergency Use Authorization (EUA). This EUA will remain in effect (meaning this test can be used) for the duration of the COVID-19 declaration under Section 564(b)(1) of the Act, 21 U.S.C. section 360bbb-3(b)(1), unless the authorization is terminated or revoked.  Performed at [facility], [address] [place], [place] [address]   Blood Culture (routine x 2)     Status: None (Preliminary result)   Collection Time: 07/30/23  6:43 PM   Specimen: BLOOD  Result Value Ref Range   Specimen  Description BLOOD LEFT ANTECUBITAL    Special Requests      BOTTLES DRAWN AEROBIC AND ANAEROBIC Blood Culture adequate volume   Culture      NO GROWTH < 12 HOURS Performed at [facility], [address], [place], [place] [address]    Report Status PENDING   Urinalysis, w/ Reflex to Culture (Infection Suspected) -Urine, Clean Catch     Status: Abnormal   Collection Time: 07/30/23  7:42 PM  Result Value Ref Range   Specimen Source URINE, CLEAN CATCH    Color, Urine YELLOW YELLOW   APPearance CLEAR CLEAR   Specific Gravity, Urine 1.025 1.005 - 1.030   pH 6.0 5.0 - 8.0   Glucose, UA NEGATIVE NEGATIVE mg/dL   Hgb urine dipstick SMALL (A) NEGATIVE   Bilirubin Urine NEGATIVE NEGATIVE   Ketones, ur 15 (A) NEGATIVE mg/dL   Protein, ur NEGATIVE NEGATIVE mg/dL   Nitrite NEGATIVE NEGATIVE   Leukocytes,Ua NEGATIVE NEGATIVE   Squamous Epithelial / HPF 0-5 0 - 5 /HPF   WBC, UA 0-5 0 - 5 WBC/hpf    Comment: Reflex urine culture not performed if WBC <=10, OR if Squamous epithelial cells >5. If Squamous epithelial cells >5, suggest recollection.   RBC / HPF 6-10 0 - 5 RBC/hpf   Bacteria, UA RARE (A) NONE SEEN    Comment: Performed at [facility], [address], [place], [place] [address]  Lactic acid, plasma     Status: None   Collection Time: 07/30/23 11:04 PM  Result Value Ref Range   Lactic Acid, Venous 1.3 0.5 - 1.9 mmol/L  Comment: Performed at [facility], [address], [place], [place] [address]  Protime-INR     Status: None   Collection Time: 07/30/23 11:04 PM  Result Value Ref Range   Prothrombin Time 15.2 11.4 - 15.2 seconds   INR 1.2 0.8 - 1.2    Comment: (NOTE) INR goal varies based on device and disease states. Performed at [facility], [address] [place], [place] [address]   APTT     Status: None   Collection Time: 07/30/23 11:04 PM  Result Value Ref Range   aPTT 29 24 - 36 seconds    Comment: Performed at [facility], [address], [place], [place] [address]  Respiratory (~20 pathogens) panel by PCR     Status: None   Collection Time: 07/31/23 12:35 AM   Specimen: Nasopharyngeal Swab; Respiratory  Result Value Ref Range   Adenovirus NOT DETECTED NOT DETECTED   Coronavirus 229E NOT DETECTED NOT DETECTED    Comment: (NOTE) The Coronavirus on the Respiratory Panel, DOES NOT test for the novel  Coronavirus (2019 nCoV)    Coronavirus HKU1 NOT DETECTED NOT DETECTED   Coronavirus NL63 NOT DETECTED NOT DETECTED   Coronavirus OC43 NOT DETECTED NOT DETECTED   Metapneumovirus NOT DETECTED NOT DETECTED   Rhinovirus / Enterovirus NOT DETECTED NOT DETECTED   Influenza A NOT DETECTED NOT DETECTED   Influenza B NOT DETECTED NOT DETECTED   Parainfluenza Virus 1 NOT DETECTED NOT DETECTED   Parainfluenza Virus 2 NOT DETECTED NOT DETECTED   Parainfluenza Virus 3 NOT DETECTED NOT DETECTED   Parainfluenza Virus 4 NOT DETECTED NOT DETECTED   Respiratory Syncytial Virus NOT DETECTED NOT DETECTED   Bordetella pertussis NOT DETECTED NOT DETECTED   Bordetella Parapertussis NOT DETECTED NOT DETECTED   Chlamydophila pneumoniae NOT DETECTED NOT DETECTED   Mycoplasma pneumoniae NOT DETECTED NOT DETECTED    Comment: Performed at [facility], [address], [place], [place] [address]  CBC     Status: Abnormal   Collection Time: 07/31/23  5:30 AM  Result Value Ref Range   WBC 6.0 4.0 - 10.5 K/uL   RBC 4.12 (L) 4.22 - 5.81 MIL/uL   Hemoglobin 11.4 (L) 13.0 - 17.0 g/dL   HCT 29.5 (L) 62.1 - 30.8 %   MCV 84.0 80.0 - 100.0 fL   MCH 27.7 26.0 - 34.0 pg   MCHC 32.9 30.0 - 36.0 g/dL   RDW 65.7 84.6 - 96.2 %   Platelets 157 150 - 400 K/uL   nRBC 0.0 0.0 - 0.2 %    Comment: Performed at [facility], [address] [place], [place] [address]  Basic metabolic panel     Status: Abnormal   Collection Time: 07/31/23  5:30 AM  Result Value Ref Range   Sodium 137 135 - 145 mmol/L   Potassium 3.6 3.5 - 5.1 mmol/L   Chloride  108 98 - 111 mmol/L   CO2 18 (L) 22 - 32 mmol/L   Glucose, Bld 163 (H) 70 - 99 mg/dL    Comment: Glucose reference range applies only to samples taken after fasting for at least 8 hours.   BUN 25 (H) 8 - 23 mg/dL   Creatinine, Ser 1.32 (H) 0.61 - 1.24 mg/dL   Calcium 8.4 (L) 8.9 - 10.3 mg/dL   GFR, Estimated 56 (L) >60 mL/min    Comment: (NOTE) Calculated using the CKD-EPI Creatinine Equation (2021)  Anion gap 11 5 - 15    Comment: Performed at [facility], [address], [place], [place] [address]  Magnesium     Status: None   Collection Time: 07/31/23  5:30 AM  Result Value Ref Range   Magnesium 1.8 1.7 - 2.4 mg/dL    Comment: Performed at [facility], [address], [place], [place] [address]  Phosphorus     Status: Abnormal   Collection Time: 07/31/23  5:30 AM  Result Value Ref Range   Phosphorus 2.2 (L) 2.5 - 4.6 mg/dL    Comment: Performed at [facility], [address], [place], [place] [address]  Lactic acid, plasma     Status: None   Collection Time: 07/31/23  5:38 AM  Result Value Ref Range   Lactic Acid, Venous 0.8 0.5 - 1.9 mmol/L    Comment: Performed at [facility], [address], [place], [place] [address]    VAS [place] LOWER EXTREMITY VENOUS (DVT) Result Date: 07/31/2023  Lower Venous DVT Study Patient Name:  Alan Hurst  Date of Exam:   07/31/2023 Medical Rec #: [id number]        Accession #:    [id number] Date of Birth: 06-Apr-1946        Patient Gender: M Patient Age:   36 years Exam [facility]  [facility] Procedure:      VAS [place] LOWER EXTREMITY VENOUS (DVT) Referring Phys: Enid Derry HALL --------------------------------------------------------------------------------  Indications: Pain, and Swelling.  Risk Factors: Cancer Prostate & lung. Anticoagulation: Lovenox. Comparison Study: None. Performing Technologist: Shona Simpson  Examination Guidelines: A complete evaluation includes B-mode imaging, spectral Doppler, color Doppler, and  power Doppler as needed of all accessible portions of each vessel. Bilateral testing is considered an integral part of a complete examination. Limited examinations for reoccurring indications may be performed as noted. The reflux portion of the exam is performed with the patient in reverse Trendelenburg.  +---------+---------------+---------+-----------+----------+--------------+ RIGHT    CompressibilityPhasicitySpontaneityPropertiesThrombus Aging +---------+---------------+---------+-----------+----------+--------------+ CFV      Full           Yes      Yes                                 +---------+---------------+---------+-----------+----------+--------------+ SFJ      Full                                                        +---------+---------------+---------+-----------+----------+--------------+ FV Prox  Full                                                        +---------+---------------+---------+-----------+----------+--------------+ FV Mid   Full                                                        +---------+---------------+---------+-----------+----------+--------------+ FV DistalFull                                                        +---------+---------------+---------+-----------+----------+--------------+  PFV      Full                                                        +---------+---------------+---------+-----------+----------+--------------+ POP      Full           Yes      Yes                                 +---------+---------------+---------+-----------+----------+--------------+ PTV      Full                                                        +---------+---------------+---------+-----------+----------+--------------+ PERO     Full                                                        +---------+---------------+---------+-----------+----------+--------------+    +----+---------------+---------+-----------+----------+--------------+ LEFTCompressibilityPhasicitySpontaneityPropertiesThrombus Aging +----+---------------+---------+-----------+----------+--------------+ CFV Full           Yes      Yes                                 +----+---------------+---------+-----------+----------+--------------+     Summary: RIGHT: - No evidence of deep vein thrombosis in the lower extremity. No indirect evidence of obstruction proximal to the inguinal ligament.  - No cystic structure found in the popliteal fossa.  LEFT: - No evidence of common femoral vein obstruction.   *See table(s) above for measurements and observations. Electronically signed by Lemar Livings MD on 07/31/2023 at 9:41:15 AM.    Final    CT Angio Chest PE W and/or Wo Contrast Result Date: 07/30/2023 CLINICAL DATA:  Emesis fever cough EXAM: CT ANGIOGRAPHY CHEST CT ABDOMEN AND PELVIS WITH CONTRAST TECHNIQUE: Multidetector CT imaging of the chest was performed using the standard protocol during bolus administration of intravenous contrast. Multiplanar CT image reconstructions and MIPs were obtained to evaluate the vascular anatomy. Multidetector CT imaging of the abdomen and pelvis was performed using the standard protocol during bolus administration of intravenous contrast. RADIATION DOSE REDUCTION: This exam was performed according to the departmental dose-optimization program which includes automated exposure control, adjustment of the mA and/or kV according to patient size and/or use of iterative reconstruction technique. CONTRAST:  75mL OMNIPAQUE IOHEXOL 350 MG/ML SOLN COMPARISON:  Chest x-ray 07/30/2023, CT chest 04/22/2022, CT chest abdomen pelvis 08/21/2022, PET CT 04/29/2023 FINDINGS: CTA CHEST FINDINGS Cardiovascular: Satisfactory opacification of the pulmonary arteries to the segmental level. No evidence of pulmonary embolism. Mild aortic atherosclerosis. No aneurysm or dissection. Coronary  vascular calcification. Normal cardiac size. No significant pericardial effusion. Mediastinum/Nodes: Patent trachea. No thyroid mass. No suspicious lymph nodes. Esophagus within normal limits. Lungs/Pleura: Trace right pleural effusion. Emphysema. Bandlike parenchymal density at the right apex, measures about 5.2 x 2.4 cm on series 4, image 26, probably stable but abuts the pleural surface compared to  prior. No acute airspace disease. Musculoskeletal: No acute or suspicious osseous abnormality. Review of the MIP images confirms the above findings. CT ABDOMEN and PELVIS FINDINGS Hepatobiliary: Distended gallbladder without calcified stone. No biliary dilatation. Pancreas: Unremarkable. No pancreatic ductal dilatation or surrounding inflammatory changes. Spleen: Normal in size without focal abnormality. Adrenals/Urinary Tract: Stranding centered around the bilateral adrenal glands. No mass or hemorrhage at this time. Kidneys show no hydronephrosis. The bladder is partially obscured by artifact from hip hardware Stomach/Bowel: Mild fluid distension of stomach. Fluid-filled proximal small bowel with some areas of distension measuring up to 3.9 cm, no well-defined transition point but relatively decompressed distal small bowel. No acute bowel wall thickening. Vascular/Lymphatic: Aortic atherosclerosis. No enlarged abdominal or pelvic lymph nodes. Retroaortic left renal vein. Reproductive: Prostate is obscured by artifact from hip hardware Other: Negative for pelvic effusion or free air. Small fat containing inguinal hernias Musculoskeletal: No acute osseous abnormality. Multilevel degenerative changes of the spine. Bilateral hip replacements with artifact. Review of the MIP images confirms the above findings. IMPRESSION: 1. Negative for acute pulmonary embolus. 2. Trace right pleural effusion. Emphysema. Bandlike parenchymal density at the right apex is grossly stable but now abuts the pleural surface compared to prior.  Continued attention on follow-up imaging recommended. 3. Fluid-filled proximal small bowel with some areas of distension measuring up to 3.9 cm, no well-defined transition point but relatively decompressed distal small bowel. Findings are favored to represent ileus but partial or developing obstruction not excluded. 4. Stranding centered around the bilateral adrenal glands, nonspecific and could be due to adrenal congestion. No evidence for adrenal hemorrhage at this time. 5. Aortic atherosclerosis. Electronically Signed   By: Jasmine Pang M.D.   On: 07/30/2023 21:57   CT ABDOMEN PELVIS W CONTRAST Result Date: 07/30/2023 CLINICAL DATA:  Emesis fever cough EXAM: CT ANGIOGRAPHY CHEST CT ABDOMEN AND PELVIS WITH CONTRAST TECHNIQUE: Multidetector CT imaging of the chest was performed using the standard protocol during bolus administration of intravenous contrast. Multiplanar CT image reconstructions and MIPs were obtained to evaluate the vascular anatomy. Multidetector CT imaging of the abdomen and pelvis was performed using the standard protocol during bolus administration of intravenous contrast. RADIATION DOSE REDUCTION: This exam was performed according to the departmental dose-optimization program which includes automated exposure control, adjustment of the mA and/or kV according to patient size and/or use of iterative reconstruction technique. CONTRAST:  75mL OMNIPAQUE IOHEXOL 350 MG/ML SOLN COMPARISON:  Chest x-ray 07/30/2023, CT chest 04/22/2022, CT chest abdomen pelvis 08/21/2022, PET CT 04/29/2023 FINDINGS: CTA CHEST FINDINGS Cardiovascular: Satisfactory opacification of the pulmonary arteries to the segmental level. No evidence of pulmonary embolism. Mild aortic atherosclerosis. No aneurysm or dissection. Coronary vascular calcification. Normal cardiac size. No significant pericardial effusion. Mediastinum/Nodes: Patent trachea. No thyroid mass. No suspicious lymph nodes. Esophagus within normal limits.  Lungs/Pleura: Trace right pleural effusion. Emphysema. Bandlike parenchymal density at the right apex, measures about 5.2 x 2.4 cm on series 4, image 26, probably stable but abuts the pleural surface compared to prior. No acute airspace disease. Musculoskeletal: No acute or suspicious osseous abnormality. Review of the MIP images confirms the above findings. CT ABDOMEN and PELVIS FINDINGS Hepatobiliary: Distended gallbladder without calcified stone. No biliary dilatation. Pancreas: Unremarkable. No pancreatic ductal dilatation or surrounding inflammatory changes. Spleen: Normal in size without focal abnormality. Adrenals/Urinary Tract: Stranding centered around the bilateral adrenal glands. No mass or hemorrhage at this time. Kidneys show no hydronephrosis. The bladder is partially obscured by artifact from hip hardware Stomach/Bowel:  Mild fluid distension of stomach. Fluid-filled proximal small bowel with some areas of distension measuring up to 3.9 cm, no well-defined transition point but relatively decompressed distal small bowel. No acute bowel wall thickening. Vascular/Lymphatic: Aortic atherosclerosis. No enlarged abdominal or pelvic lymph nodes. Retroaortic left renal vein. Reproductive: Prostate is obscured by artifact from hip hardware Other: Negative for pelvic effusion or free air. Small fat containing inguinal hernias Musculoskeletal: No acute osseous abnormality. Multilevel degenerative changes of the spine. Bilateral hip replacements with artifact. Review of the MIP images confirms the above findings. IMPRESSION: 1. Negative for acute pulmonary embolus. 2. Trace right pleural effusion. Emphysema. Bandlike parenchymal density at the right apex is grossly stable but now abuts the pleural surface compared to prior. Continued attention on follow-up imaging recommended. 3. Fluid-filled proximal small bowel with some areas of distension measuring up to 3.9 cm, no well-defined transition point but relatively  decompressed distal small bowel. Findings are favored to represent ileus but partial or developing obstruction not excluded. 4. Stranding centered around the bilateral adrenal glands, nonspecific and could be due to adrenal congestion. No evidence for adrenal hemorrhage at this time. 5. Aortic atherosclerosis. Electronically Signed   By: Jasmine Pang M.D.   On: 07/30/2023 21:57   DG Chest Port 1 View Result Date: 07/30/2023 CLINICAL DATA:  Questionable sepsis - evaluate for abnormality EXAM: PORTABLE CHEST 1 VIEW COMPARISON:  Chest x-ray 04/22/2022 FINDINGS: The heart and mediastinal contours are unchanged. Atherosclerotic plaque. Left base atelectasis. No focal consolidation. No pulmonary edema. No pleural effusion. No pneumothorax. No acute osseous abnormality. IMPRESSION: 1. No active disease. 2.  Aortic Atherosclerosis (ICD10-I70.0). Electronically Signed   By: Tish Frederickson M.D.   On: 07/30/2023 19:46    Review of Systems  Constitutional:  Positive for appetite change.  HENT: Negative.    Eyes: Negative.   Respiratory: Negative.    Cardiovascular: Negative.   Gastrointestinal:  Negative for abdominal pain.       See HPI  Endocrine: Negative.   Genitourinary: Negative.   Musculoskeletal: Negative.   Allergic/Immunologic: Negative.   Neurological: Negative.   Hematological: Negative.   Psychiatric/Behavioral: Negative.     Blood pressure 128/71, pulse 94, temperature 99 F (37.2 C), temperature source Oral, resp. rate (!) 21, height 5\' 9"  (1.753 m), weight 81.6 kg, SpO2 100%. Physical Exam HENT:     Mouth/Throat:     Mouth: Mucous membranes are moist.  Cardiovascular:     Rate and Rhythm: Normal rate and regular rhythm.  Pulmonary:     Effort: Pulmonary effort is normal.     Breath sounds: Normal breath sounds. No wheezing.  Abdominal:     Palpations: Abdomen is soft.     Tenderness: There is no abdominal tenderness. There is no guarding or rebound.     Comments: Mild  tenderness  Musculoskeletal:        General: No tenderness.  Skin:    General: Skin is warm.  Neurological:     Mental Status: He is alert and oriented to person, place, and time.  Psychiatric:        Mood and Affect: Mood normal.     Assessment/Plan: SBO versus ileus -no abdominal pain, white count is normal.  Agree with IV fluids and bowel rest.  Will proceed with SBO protocol using oral contrast.  No need for emergent surgery.  We will follow.  Liz Malady 07/31/2023, 1:29 PM

## 2023-07-31 NOTE — Progress Notes (Signed)
  Progress Note   Patient: Alan Hurst ZOX:[id number] DOB: 05/27/1945 DOA: 07/30/2023     0 DOS: the patient was seen and examined on 07/31/2023   Brief hospital course: 78 y.o. 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  Associated with generalized weakness.  His wife felt nauseous too however her symptoms resolved quickly.   In the ER noted to be febrile.  Chest x-ray was nonacute.  CTA was negative for pulmonary embolism.  CT abdomen and pelvis showed findings suggestive of partial small bowel obstruction versus ileus.  Incidentally noted stranding around adrenal glands with no evidence of hemorrhage.    Assessment and Plan: Intractable nausea and vomiting with concern for ileus vs SBO: On IV fluids, antiemetic Surgery consulted today 3/22.  Appreciate input SBO protocol initiated No further nausea/vomiting, holding on NGT   Unilateral right lower extremity edema Negative dopplers, reassuring   Metastatic prostate cancer Holding while in house, restart would need medical oncology approval   Hypokalemia/hypophos Replaced Goal >4 with possible ileus   CKD 3 A Appears to be close to baseline of past year Avoid nephrotoxic agents, dehydration, and hypotension Monitor urine output Repeat BMP   Non anion gap metabolic acidosis Slowly improving, lacitic acid normal   Generalized weakness PT OT assessment Fall precautions.      Subjective: Feels a little better today.  No N/V since coming to being admitted.  Not hungry.  No BM/flatus.   Physical Exam: Vitals:   07/31/23 1000 07/31/23 1100 07/31/23 1200 07/31/23 1335  BP: 100/73 (!) 117/59 128/71 130/60  Pulse: 92 94 94 100  Resp: (!) 23 15 (!) 21 (!) 24  Temp:    99.2 F (37.3 C)  TempSrc:    Oral  SpO2: 95% 98% 100% 100%  Weight:      Height:        PE: General: 78 y.o. year-old male well developed well nourished in no acute distress.  Alert and oriented x3. Cardiovascular: Regular rate and rhythm with no rubs or gallops.  No thyromegaly or JVD noted.  Unilateral right lower extremity edema.   Respiratory: Some end expiratory wheezing noted Abdomen: Soft mild tenderness without guarding or rebound, minimally distended.   Muskuloskeletal: No cyanosis, no clubbing noted bilaterally Neuro: CN II-XII intact, strength, sensation, reflexes Skin: No ulcerative lesions noted or rashes Psychiatry: Judgement and insight appear normal. Mood is appropriate for condition and setting    Disposition: Status is: Inpatient  Planned Discharge Destination: Home when medically stable    Time spent: 55 minutes  Author: Tobey Grim, MD 07/31/2023 1:49 PM  For on call review [email or web address].

## 2023-07-31 NOTE — Evaluation (Signed)
 Physical Therapy Evaluation Patient Details Name: Alan Hurst MRN: [id number] DOB: 09-Apr-1946 Today's Date: 07/31/2023  History of Present Illness  78 y.o. male admitted 3/21 with Intractable nausea and vomiting with concern for ileus vs SBO. 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 Ind at baseline, cares for his wife who is under hospice care but he does actively assist with transfers. Mobilizing at a mod I level today. Feels more fatigued than baseline with mild dyspnea. HR in low 100s while ambulating. May benefit from rollator for community distances with OPPT follow-up if recovery is slower than anticipated. Pt agreeable to acute and post-acute recommendations. Will follow until d/c.  Pt currently with functional limitations due to the deficits listed below (see PT Problem List). Pt will benefit from acute skilled PT to increase their independence and safety with mobility to allow discharge.         If plan is discharge home, recommend the following: Assist for transportation   Can travel by private vehicle        Equipment Recommendations Rollator (4 wheels)  Recommendations for Other Services       Functional Status Assessment Patient has had a recent decline in their functional status and demonstrates the ability to make significant improvements in function in a reasonable and predictable amount of time.     Precautions / Restrictions Precautions Precautions: None Recall of Precautions/Restrictions: Intact Restrictions Weight Bearing Restrictions Per Provider Order: No      Mobility  Bed Mobility Overal bed mobility: Modified Independent             General bed mobility comments: extra time, some effort    Transfers Overall transfer level: Modified independent Equipment used: None               General transfer comment:  Able to stand with arms across chest    Ambulation/Gait Ambulation/Gait assistance: Modified independent (Device/Increase time) Gait Distance (Feet): 110 Feet Assistive device: None Gait Pattern/deviations: Step-through pattern, Decreased stride length, Shuffle Gait velocity: dec Gait velocity interpretation: 1.31 - 2.62 ft/sec, indicative of limited community ambulator   General Gait Details: No overt LOB noted with ambulatory bout. Smaller steps, initially shuffled but improved as we exited room. Reports feeling more fatigued than usual with mild dyspnea but states not uncommon with his reported hx of COPD.  Stairs            Wheelchair Mobility     Tilt Bed    Modified Rankin (Stroke Patients Only)       Balance Overall balance assessment: Modified Independent                               Standardized Balance Assessment Standardized Balance Assessment : Berg Balance Test Berg Balance Test Sit to Stand: Able to stand without using hands and stabilize independently Standing Unsupported: Able to stand safely 2 minutes Sitting with Back Unsupported but Feet Supported on Floor or Stool: Able to sit safely and securely 2 minutes Stand to Sit: Sits safely with minimal use of hands Transfers: Able to transfer safely, minor use of hands Standing Unsupported with Eyes Closed: Able to stand 10 seconds safely Standing Ubsupported with Feet Together: Able to place feet together independently and stand 1 minute safely From Standing, Reach Forward with Outstretched Arm: Can reach confidently >25  cm (10") From Standing Position, Pick up Object from Floor: Able to pick up shoe safely and easily From Standing Position, Turn to Look Behind Over each Shoulder: Looks behind from both sides and weight shifts well Turn 360 Degrees: Able to turn 360 degrees safely in 4 seconds or less Standing Unsupported, Alternately Place Feet on Step/Stool: Able to stand independently and  complete 8 steps >20 seconds Standing Unsupported, One Foot in Front: Able to plae foot ahead of the other independently and hold 30 seconds Standing on One Leg: Tries to lift leg/unable to hold 3 seconds but remains standing independently Total Score: 51         Pertinent Vitals/Pain Pain Assessment Pain Assessment: No/denies pain    Home Living Family/patient expects to be discharged to:: Private residence Living Arrangements: Spouse/significant other Available Help at Discharge: Family;Friend(s);Available PRN/intermittently (pt is primary care provider for wife) Type of Home: House Home Access: Ramped entrance       Home Layout: One level Home Equipment: Shower seat;Other (comment) Water quality scientist lift for wife)      Prior Function Prior Level of Function : Independent/Modified Independent;Driving             Mobility Comments: Ind, no device, cares for wife who is dependent ADLs Comments: Ind, takes care of wife (has hospice and aides assist with her as well, but he helps perform bed care and transfers her with hoyer.)     Extremity/Trunk Assessment   Upper Extremity Assessment Upper Extremity Assessment: Defer to OT evaluation    Lower Extremity Assessment Lower Extremity Assessment: Overall WFL for tasks assessed       Communication   Communication Communication: No apparent difficulties    Cognition Arousal: Alert Behavior During Therapy: WFL for tasks assessed/performed   PT - Cognitive impairments: No apparent impairments                         Following commands: Intact       Cueing Cueing Techniques: Verbal cues     General Comments      Exercises     Assessment/Plan    PT Assessment Patient needs continued PT services  PT Problem List Decreased activity tolerance;Decreased mobility;Decreased balance;Decreased knowledge of use of DME;Cardiopulmonary status limiting activity       PT Treatment Interventions DME instruction;Gait  training;Functional mobility training;Therapeutic activities;Therapeutic exercise;Balance training;Neuromuscular re-education;Patient/family education    PT Goals (Current goals can be found in the Care Plan section)  Acute Rehab PT Goals Patient Stated Goal: get well, return home PT Goal Formulation: With patient Time For Goal Achievement: 08/07/23 Potential to Achieve Goals: Good    Frequency Min 2X/week     Co-evaluation               AM-PAC PT "6 Clicks" Mobility  Outcome Measure Help needed turning from your back to your side while in a flat bed without using bedrails?: None Help needed moving from lying on your back to sitting on the side of a flat bed without using bedrails?: None Help needed moving to and from a bed to a chair (including a wheelchair)?: None Help needed standing up from a chair using your arms (e.g., wheelchair or bedside chair)?: None Help needed to walk in hospital room?: None Help needed climbing 3-5 steps with a railing? : A Little 6 Click Score: 23    End of Session Equipment Utilized During Treatment: Gait belt Activity Tolerance: Patient tolerated treatment well  Patient left: in bed;with call bell/phone within reach;with nursing/sitter in room   PT Visit Diagnosis: Other abnormalities of gait and mobility (R26.89)    Time: 1610-9604 PT Time Calculation (min) (ACUTE ONLY): 20 min   Charges:   PT Evaluation $PT Eval Low Complexity: 1 Low   PT General Charges $$ ACUTE PT VISIT: 1 Visit         Kathlyn [place], PT, DPT [facility]  Rehabilitation Services Physical Therapist Office: [phone] Website: [email or web address]   Berton Mount 07/31/2023, 4:25 PM

## 2023-07-31 NOTE — Progress Notes (Signed)
 Lower extremity venous duplex completed. Please see CV Procedures for preliminary results.  Shona Simpson, RVT 07/31/23 9:39 AM

## 2023-07-31 NOTE — ED Notes (Signed)
 1st lac 1.3 in normal range, 2nd not needed can be canceled

## 2023-08-01 ENCOUNTER — Inpatient Hospital Stay (HOSPITAL_COMMUNITY)

## 2023-08-01 DIAGNOSIS — R112 Nausea with vomiting, unspecified: Secondary | ICD-10-CM | POA: Diagnosis not present

## 2023-08-01 LAB — CBC
HCT: 31.4 % — ABNORMAL LOW (ref 39.0–52.0)
Hemoglobin: 10.4 g/dL — ABNORMAL LOW (ref 13.0–17.0)
MCH: 27.4 pg (ref 26.0–34.0)
MCHC: 33.1 g/dL (ref 30.0–36.0)
MCV: 82.6 fL (ref 80.0–100.0)
Platelets: 103 10*3/uL — ABNORMAL LOW (ref 150–400)
RBC: 3.8 MIL/uL — ABNORMAL LOW (ref 4.22–5.81)
RDW: 14.6 % (ref 11.5–15.5)
WBC: 5.2 10*3/uL (ref 4.0–10.5)
nRBC: 0 % (ref 0.0–0.2)

## 2023-08-01 LAB — BASIC METABOLIC PANEL
Anion gap: 9 (ref 5–15)
BUN: 27 mg/dL — ABNORMAL HIGH (ref 8–23)
CO2: 19 mmol/L — ABNORMAL LOW (ref 22–32)
Calcium: 8.1 mg/dL — ABNORMAL LOW (ref 8.9–10.3)
Chloride: 108 mmol/L (ref 98–111)
Creatinine, Ser: 1.23 mg/dL (ref 0.61–1.24)
GFR, Estimated: >60 mL/min (ref >60)
Glucose, Bld: 101 mg/dL — ABNORMAL HIGH (ref 70–99)
Potassium: 3.4 mmol/L — ABNORMAL LOW (ref 3.5–5.1)
Sodium: 136 mmol/L (ref 135–145)

## 2023-08-01 LAB — PHOSPHORUS: Phosphorus: 2.3 mg/dL — ABNORMAL LOW (ref 2.5–4.6)

## 2023-08-01 NOTE — Progress Notes (Signed)
 RT called to pt room for PRN tx RT went to give breathing tx pt currently resting comfortably.

## 2023-08-01 NOTE — Discharge Summary (Signed)
 Physician Discharge Summary   Patient: Alan Hurst MRN: [id number] DOB: 1946-02-01  Admit date:     07/30/2023  Discharge date: 08/01/23  Discharge Physician: Tobey Grim   PCP: Melida Quitter, PA   Recommendations at discharge:   Patient recommended to slowly increase time back to normal.  Able to tolerate soft full diet prior to discharge.  Recommended to restart Zytiga after he returned back home.  Consulted with on-call medical oncologist who recommended holding this while in house and restarting at home.  Patient scheduled to start radiation therapy tomorrow but still very tired likely unable to attend.  Will need to call and reschedule.  Discharge Diagnoses: Principal Problem:   Intractable nausea and vomiting Active Problems:   SBO (small bowel obstruction) (HCC)  Resolved Problems:   * No resolved hospital problems. *  Hospital Course: 78 y.o. 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  Associated with generalized weakness.  His wife felt nauseous too however her symptoms resolved quickly.  In the ER noted to be febrile.  Chest x-ray was nonacute.  CTA was negative for pulmonary embolism.  CT abdomen and pelvis showed findings suggestive of partial small bowel obstruction versus ileus.  Incidentally noted stranding around adrenal glands with no evidence of hemorrhage.  Due to concerns for ileus/beginning SBO.  General surgery was consulted.  Small bowel protocol started.  This showed contrast in colon and on hospital day 3 patient was able to have a formed stool.  He had no further nausea after being admitted to the hospital from the emergency department.  No further vomiting.  Able to tolerate pretty quick acceleration of his diet from n.p.o. to full liquids to soft foods.  Due to his ongoing  improvement, feeling that he was back to his baseline, and his desire as well to go home to care for his quadriplegic wife he was discharged home on hospital day 3.     Assessment and Plan: Intractable nausea and vomiting with concern for ileus vs SBO: CT scan showed concern for SBO versus ileus -However this resolved after admission to the hospital. -Final diagnosis therefore likely viral gastroenteritis especially in light of him being febrile when brought into the emergency department in fact his wife also had transient nausea vomiting at home.   Unilateral right lower extremity edema Negative dopplers, reassuring   Metastatic prostate cancer Restart home medications at home.   Hypokalemia/hypophos Replaced Goal >4 with possible ileus   CKD 3 A Patient at discharge was at baseline creatinine.   Non anion gap metabolic acidosis Slowly improving, lacitic acid normal   Generalized weakness PT OT assessment Fall precautions.       Consultants: General Surgery Disposition: Home Diet recommendation:  Discharge Diet Orders (From admission, onward)     Start     Ordered   08/01/23 0000  Diet - low sodium heart healthy        08/01/23 1242           Regular diet DISCHARGE MEDICATION: Allergies as of 08/01/2023   No Known Allergies      Medication List     TAKE these medications    abiraterone acetate 250 MG tablet Commonly known as: ZYTIGA Take 1,000 mg by mouth daily.   aspirin EC 81 MG tablet Take 81 mg by  mouth daily.   azithromycin 250 MG tablet Commonly known as: ZITHROMAX Take 250 mg by mouth every other day.   furosemide 40 MG tablet Commonly known as: LASIX Take 1 tablet (40 mg total) by mouth as needed. Please take daily as needed for greater than 3 lbs a day or 5 lbs in a week   levalbuterol 45 MCG/ACT inhaler Commonly known as: XOPENEX HFA Inhale 2 puffs into the lungs every 4 (four) hours as needed for wheezing or shortness of breath.    losartan 25 MG tablet Commonly known as: COZAAR Take 1 tablet (25 mg total) by mouth daily.   lovastatin 40 MG tablet Commonly known as: MEVACOR TAKE 1 TABLET BY MOUTH AT  BEDTIME   magnesium oxide 400 MG tablet Commonly known as: MAG-OX Take 400 mg by mouth daily.   Melatonin 10 MG Tabs Take 10 mg by mouth at bedtime.   metFORMIN 500 MG tablet Commonly known as: GLUCOPHAGE TAKE 1 TABLET BY MOUTH DAILY   metoprolol succinate 25 MG 24 hr tablet Commonly known as: TOPROL-XL Take 1 tablet (25 mg total) by mouth daily.   Omega-3 500 MG Caps Take 1 capsule by mouth daily.   omeprazole 20 MG capsule Commonly known as: PRILOSEC Take 1 capsule (20 mg total) by mouth daily.   Qvar RediHaler 40 MCG/ACT inhaler Generic drug: beclomethasone Inhale 1 puff into the lungs 2 (two) times daily.   Stiolto Respimat 2.5-2.5 MCG/ACT Aers Generic drug: Tiotropium Bromide-Olodaterol Inhale 1 puff into the lungs daily at 12 noon.   vitamin B-12 500 MCG tablet Commonly known as: CYANOCOBALAMIN Take 500 mcg by mouth daily.   vitamin C 1000 MG tablet Take 1,000 mg by mouth daily.   Vitamin D 50 MCG (2000 UT) tablet Take 2,000 Units by mouth daily.        Discharge Exam: Filed Weights   07/30/23 1757  Weight: 81.6 kg   General: 78 y.o. year-old male well developed well nourished in no acute distress.  Alert and oriented x3. Cardiovascular: Regular rate and rhythm with no rubs or gallops.  No thyromegaly or JVD noted.  Unilateral right lower extremity edema.   Respiratory: Some end expiratory wheezing noted Abdomen: No further abdominal tenderness on exam.  Nondistended.  Follow-up with rebound. Muskuloskeletal: No cyanosis, no clubbing noted bilaterally Neuro: CN II-XII intact, strength, sensation, reflexes Skin: No ulcerative lesions noted or rashes Psychiatry: Judgement and insight appear normal. Mood is appropriate for condition and setting  Condition at discharge:  good  The results of significant diagnostics from this hospitalization (including imaging, microbiology, ancillary and laboratory) are listed below for reference.   Imaging Studies: DG Abd Portable 1V-Small Bowel Obstruction Protocol-initial, 8 hr delay Result Date: 08/01/2023 CLINICAL DATA:  8 hour small-bowel delay EXAM: PORTABLE ABDOMEN - 1 VIEW COMPARISON:  CT abdomen and pelvis 07/30/2023 FINDINGS: Contrast material is demonstrated throughout the colon without significant residual small bowel contrast. No small bowel distention. Changes suggest no evidence of small-bowel obstruction. Degenerative changes in the spine. Postoperative changes in the hips. IMPRESSION: Contrast material is demonstrated throughout the colon suggesting no evidence of small-bowel obstruction. Electronically Signed   By: Burman Nieves M.D.   On: 08/01/2023 00:45   VAS [place] LOWER EXTREMITY VENOUS (DVT) Result Date: 07/31/2023  Lower Venous DVT Study Patient Name:  VADHIR MCNAY  Date of Exam:   07/31/2023 Medical Rec #: [id number]        Accession #:    [id number] Date of  Birth: 02-05-1946        Patient Gender: M Patient Age:   77 years Exam Location:  [facility] Procedure:      VAS [place] LOWER EXTREMITY VENOUS (DVT) Referring Phys: CAROLE HALL --------------------------------------------------------------------------------  Indications: Pain, and Swelling.  Risk Factors: Cancer Prostate & lung. Anticoagulation: Lovenox. Comparison Study: None. Performing Technologist: Shona Simpson  Examination Guidelines: A complete evaluation includes B-mode imaging, spectral Doppler, color Doppler, and power Doppler as needed of all accessible portions of each vessel. Bilateral testing is considered an integral part of a complete examination. Limited examinations for reoccurring indications may be performed as noted. The reflux portion of the exam is performed with the patient in reverse Trendelenburg.   +---------+---------------+---------+-----------+----------+--------------+ RIGHT    CompressibilityPhasicitySpontaneityPropertiesThrombus Aging +---------+---------------+---------+-----------+----------+--------------+ CFV      Full           Yes      Yes                                 +---------+---------------+---------+-----------+----------+--------------+ SFJ      Full                                                        +---------+---------------+---------+-----------+----------+--------------+ FV Prox  Full                                                        +---------+---------------+---------+-----------+----------+--------------+ FV Mid   Full                                                        +---------+---------------+---------+-----------+----------+--------------+ FV DistalFull                                                        +---------+---------------+---------+-----------+----------+--------------+ PFV      Full                                                        +---------+---------------+---------+-----------+----------+--------------+ POP      Full           Yes      Yes                                 +---------+---------------+---------+-----------+----------+--------------+ PTV      Full                                                        +---------+---------------+---------+-----------+----------+--------------+  PERO     Full                                                        +---------+---------------+---------+-----------+----------+--------------+   +----+---------------+---------+-----------+----------+--------------+ LEFTCompressibilityPhasicitySpontaneityPropertiesThrombus Aging +----+---------------+---------+-----------+----------+--------------+ CFV Full           Yes      Yes                                  +----+---------------+---------+-----------+----------+--------------+     Summary: RIGHT: - No evidence of deep vein thrombosis in the lower extremity. No indirect evidence of obstruction proximal to the inguinal ligament.  - No cystic structure found in the popliteal fossa.  LEFT: - No evidence of common femoral vein obstruction.   *See table(s) above for measurements and observations. Electronically signed by Lemar Livings MD on 07/31/2023 at 9:41:15 AM.    Final    CT Angio Chest PE W and/or Wo Contrast Result Date: 07/30/2023 CLINICAL DATA:  Emesis fever cough EXAM: CT ANGIOGRAPHY CHEST CT ABDOMEN AND PELVIS WITH CONTRAST TECHNIQUE: Multidetector CT imaging of the chest was performed using the standard protocol during bolus administration of intravenous contrast. Multiplanar CT image reconstructions and MIPs were obtained to evaluate the vascular anatomy. Multidetector CT imaging of the abdomen and pelvis was performed using the standard protocol during bolus administration of intravenous contrast. RADIATION DOSE REDUCTION: This exam was performed according to the departmental dose-optimization program which includes automated exposure control, adjustment of the mA and/or kV according to patient size and/or use of iterative reconstruction technique. CONTRAST:  75mL OMNIPAQUE IOHEXOL 350 MG/ML SOLN COMPARISON:  Chest x-ray 07/30/2023, CT chest 04/22/2022, CT chest abdomen pelvis 08/21/2022, PET CT 04/29/2023 FINDINGS: CTA CHEST FINDINGS Cardiovascular: Satisfactory opacification of the pulmonary arteries to the segmental level. No evidence of pulmonary embolism. Mild aortic atherosclerosis. No aneurysm or dissection. Coronary vascular calcification. Normal cardiac size. No significant pericardial effusion. Mediastinum/Nodes: Patent trachea. No thyroid mass. No suspicious lymph nodes. Esophagus within normal limits. Lungs/Pleura: Trace right pleural effusion. Emphysema. Bandlike parenchymal density at the right  apex, measures about 5.2 x 2.4 cm on series 4, image 26, probably stable but abuts the pleural surface compared to prior. No acute airspace disease. Musculoskeletal: No acute or suspicious osseous abnormality. Review of the MIP images confirms the above findings. CT ABDOMEN and PELVIS FINDINGS Hepatobiliary: Distended gallbladder without calcified stone. No biliary dilatation. Pancreas: Unremarkable. No pancreatic ductal dilatation or surrounding inflammatory changes. Spleen: Normal in size without focal abnormality. Adrenals/Urinary Tract: Stranding centered around the bilateral adrenal glands. No mass or hemorrhage at this time. Kidneys show no hydronephrosis. The bladder is partially obscured by artifact from hip hardware Stomach/Bowel: Mild fluid distension of stomach. Fluid-filled proximal small bowel with some areas of distension measuring up to 3.9 cm, no well-defined transition point but relatively decompressed distal small bowel. No acute bowel wall thickening. Vascular/Lymphatic: Aortic atherosclerosis. No enlarged abdominal or pelvic lymph nodes. Retroaortic left renal vein. Reproductive: Prostate is obscured by artifact from hip hardware Other: Negative for pelvic effusion or free air. Small fat containing inguinal hernias Musculoskeletal: No acute osseous abnormality. Multilevel degenerative changes of the spine. Bilateral hip replacements with artifact. Review of the MIP images confirms the above findings. IMPRESSION: 1. Negative for acute  pulmonary embolus. 2. Trace right pleural effusion. Emphysema. Bandlike parenchymal density at the right apex is grossly stable but now abuts the pleural surface compared to prior. Continued attention on follow-up imaging recommended. 3. Fluid-filled proximal small bowel with some areas of distension measuring up to 3.9 cm, no well-defined transition point but relatively decompressed distal small bowel. Findings are favored to represent ileus but partial or  developing obstruction not excluded. 4. Stranding centered around the bilateral adrenal glands, nonspecific and could be due to adrenal congestion. No evidence for adrenal hemorrhage at this time. 5. Aortic atherosclerosis. Electronically Signed   By: Jasmine Pang M.D.   On: 07/30/2023 21:57   CT ABDOMEN PELVIS W CONTRAST Result Date: 07/30/2023 CLINICAL DATA:  Emesis fever cough EXAM: CT ANGIOGRAPHY CHEST CT ABDOMEN AND PELVIS WITH CONTRAST TECHNIQUE: Multidetector CT imaging of the chest was performed using the standard protocol during bolus administration of intravenous contrast. Multiplanar CT image reconstructions and MIPs were obtained to evaluate the vascular anatomy. Multidetector CT imaging of the abdomen and pelvis was performed using the standard protocol during bolus administration of intravenous contrast. RADIATION DOSE REDUCTION: This exam was performed according to the departmental dose-optimization program which includes automated exposure control, adjustment of the mA and/or kV according to patient size and/or use of iterative reconstruction technique. CONTRAST:  75mL OMNIPAQUE IOHEXOL 350 MG/ML SOLN COMPARISON:  Chest x-ray 07/30/2023, CT chest 04/22/2022, CT chest abdomen pelvis 08/21/2022, PET CT 04/29/2023 FINDINGS: CTA CHEST FINDINGS Cardiovascular: Satisfactory opacification of the pulmonary arteries to the segmental level. No evidence of pulmonary embolism. Mild aortic atherosclerosis. No aneurysm or dissection. Coronary vascular calcification. Normal cardiac size. No significant pericardial effusion. Mediastinum/Nodes: Patent trachea. No thyroid mass. No suspicious lymph nodes. Esophagus within normal limits. Lungs/Pleura: Trace right pleural effusion. Emphysema. Bandlike parenchymal density at the right apex, measures about 5.2 x 2.4 cm on series 4, image 26, probably stable but abuts the pleural surface compared to prior. No acute airspace disease. Musculoskeletal: No acute or  suspicious osseous abnormality. Review of the MIP images confirms the above findings. CT ABDOMEN and PELVIS FINDINGS Hepatobiliary: Distended gallbladder without calcified stone. No biliary dilatation. Pancreas: Unremarkable. No pancreatic ductal dilatation or surrounding inflammatory changes. Spleen: Normal in size without focal abnormality. Adrenals/Urinary Tract: Stranding centered around the bilateral adrenal glands. No mass or hemorrhage at this time. Kidneys show no hydronephrosis. The bladder is partially obscured by artifact from hip hardware Stomach/Bowel: Mild fluid distension of stomach. Fluid-filled proximal small bowel with some areas of distension measuring up to 3.9 cm, no well-defined transition point but relatively decompressed distal small bowel. No acute bowel wall thickening. Vascular/Lymphatic: Aortic atherosclerosis. No enlarged abdominal or pelvic lymph nodes. Retroaortic left renal vein. Reproductive: Prostate is obscured by artifact from hip hardware Other: Negative for pelvic effusion or free air. Small fat containing inguinal hernias Musculoskeletal: No acute osseous abnormality. Multilevel degenerative changes of the spine. Bilateral hip replacements with artifact. Review of the MIP images confirms the above findings. IMPRESSION: 1. Negative for acute pulmonary embolus. 2. Trace right pleural effusion. Emphysema. Bandlike parenchymal density at the right apex is grossly stable but now abuts the pleural surface compared to prior. Continued attention on follow-up imaging recommended. 3. Fluid-filled proximal small bowel with some areas of distension measuring up to 3.9 cm, no well-defined transition point but relatively decompressed distal small bowel. Findings are favored to represent ileus but partial or developing obstruction not excluded. 4. Stranding centered around the bilateral adrenal glands, nonspecific and could be  due to adrenal congestion. No evidence for adrenal hemorrhage at  this time. 5. Aortic atherosclerosis. Electronically Signed   By: Jasmine Pang M.D.   On: 07/30/2023 21:57   DG Chest Port 1 View Result Date: 07/30/2023 CLINICAL DATA:  Questionable sepsis - evaluate for abnormality EXAM: PORTABLE CHEST 1 VIEW COMPARISON:  Chest x-ray 04/22/2022 FINDINGS: The heart and mediastinal contours are unchanged. Atherosclerotic plaque. Left base atelectasis. No focal consolidation. No pulmonary edema. No pleural effusion. No pneumothorax. No acute osseous abnormality. IMPRESSION: 1. No active disease. 2.  Aortic Atherosclerosis (ICD10-I70.0). Electronically Signed   By: Tish Frederickson M.D.   On: 07/30/2023 19:46    Microbiology: Results for orders placed or performed during the hospital encounter of 07/30/23  Blood Culture (routine x 2)     Status: None (Preliminary result)   Collection Time: 07/30/23  6:38 PM   Specimen: BLOOD RIGHT ARM  Result Value Ref Range Status   Specimen Description BLOOD RIGHT ARM  Final   Special Requests   Final    BOTTLES DRAWN AEROBIC AND ANAEROBIC Blood Culture adequate volume   Culture   Final    NO GROWTH 2 DAYS Performed at [facility], [address], [place], [place] [address]    Report Status PENDING  Incomplete  Resp panel by RT-PCR (RSV, Flu A&B, Covid) Anterior Nasal Swab     Status: None   Collection Time: 07/30/23  6:38 PM   Specimen: Anterior Nasal Swab  Result Value Ref Range Status   SARS Coronavirus 2 by RT PCR NEGATIVE NEGATIVE Final   Influenza A by PCR NEGATIVE NEGATIVE Final   Influenza B by PCR NEGATIVE NEGATIVE Final    Comment: (NOTE) The Xpert Xpress SARS-CoV-2/FLU/RSV plus assay is intended as an aid in the diagnosis of influenza from Nasopharyngeal swab specimens and should not be used as a sole basis for treatment. Nasal washings and aspirates are unacceptable for Xpert Xpress SARS-CoV-2/FLU/RSV testing.  Fact Sheet for Patients: BloggerCourse.com  Fact Sheet for  Healthcare Providers: SeriousBroker.it  This test is not yet approved or cleared by the [place] FDA and has been authorized for detection and/or diagnosis of SARS-CoV-2 by FDA under an Emergency Use Authorization (EUA). This EUA will remain in effect (meaning this test can be used) for the duration of the COVID-19 declaration under Section 564(b)(1) of the Act, 21 U.S.C. section 360bbb-3(b)(1), unless the authorization is terminated or revoked.     Resp Syncytial Virus by PCR NEGATIVE NEGATIVE Final    Comment: (NOTE) Fact Sheet for Patients: BloggerCourse.com  Fact Sheet for Healthcare Providers: SeriousBroker.it  This test is not yet approved or cleared by the [place] FDA and has been authorized for detection and/or diagnosis of SARS-CoV-2 by FDA under an Emergency Use Authorization (EUA). This EUA will remain in effect (meaning this test can be used) for the duration of the COVID-19 declaration under Section 564(b)(1) of the Act, 21 U.S.C. section 360bbb-3(b)(1), unless the authorization is terminated or revoked.  Performed at [facility], [address], [place], [place] [address]   Blood Culture (routine x 2)     Status: None (Preliminary result)   Collection Time: 07/30/23  6:43 PM   Specimen: BLOOD  Result Value Ref Range Status   Specimen Description BLOOD LEFT ANTECUBITAL  Final   Special Requests   Final    BOTTLES DRAWN AEROBIC AND ANAEROBIC Blood Culture adequate volume   Culture   Final  NO GROWTH 2 DAYS Performed at [facility], [address], [place], [place] [address]    Report Status PENDING  Incomplete  Respiratory (~20 pathogens) panel by PCR     Status: None   Collection Time: 07/31/23 12:35 AM   Specimen: Nasopharyngeal Swab; Respiratory  Result Value Ref Range Status   Adenovirus NOT DETECTED NOT DETECTED Final   Coronavirus 229E NOT DETECTED NOT  DETECTED Final    Comment: (NOTE) The Coronavirus on the Respiratory Panel, DOES NOT test for the novel  Coronavirus (2019 nCoV)    Coronavirus HKU1 NOT DETECTED NOT DETECTED Final   Coronavirus NL63 NOT DETECTED NOT DETECTED Final   Coronavirus OC43 NOT DETECTED NOT DETECTED Final   Metapneumovirus NOT DETECTED NOT DETECTED Final   Rhinovirus / Enterovirus NOT DETECTED NOT DETECTED Final   Influenza A NOT DETECTED NOT DETECTED Final   Influenza B NOT DETECTED NOT DETECTED Final   Parainfluenza Virus 1 NOT DETECTED NOT DETECTED Final   Parainfluenza Virus 2 NOT DETECTED NOT DETECTED Final   Parainfluenza Virus 3 NOT DETECTED NOT DETECTED Final   Parainfluenza Virus 4 NOT DETECTED NOT DETECTED Final   Respiratory Syncytial Virus NOT DETECTED NOT DETECTED Final   Bordetella pertussis NOT DETECTED NOT DETECTED Final   Bordetella Parapertussis NOT DETECTED NOT DETECTED Final   Chlamydophila pneumoniae NOT DETECTED NOT DETECTED Final   Mycoplasma pneumoniae NOT DETECTED NOT DETECTED Final    Comment: Performed at [facility], [address], [place], [place] [address]    Labs: CBC: Recent Labs  Lab 07/30/23 1838 07/31/23 0530 08/01/23 0716  WBC 5.3 6.0 5.2  NEUTROABS 4.7  --   --   HGB 13.0 11.4* 10.4*  HCT 39.7 34.6* 31.4*  MCV 84.6 84.0 82.6  PLT 175 157 103*   Basic Metabolic Panel: Recent Labs  Lab 07/30/23 1838 07/31/23 0530 08/01/23 0716  NA 138 137 136  K 3.3* 3.6 3.4*  CL 106 108 108  CO2 17* 18* 19*  GLUCOSE 142* 163* 101*  BUN 25* 25* 27*  CREATININE 1.35* 1.31* 1.23  CALCIUM 9.2 8.4* 8.1*  MG  --  1.8  --   PHOS  --  2.2* 2.3*   Liver Function Tests: Recent Labs  Lab 07/30/23 1838  AST 26  ALT 20  ALKPHOS 57  BILITOT 0.7  PROT 6.5  ALBUMIN 3.5   CBG: No results for input(s): "GLUCAP" in the last 168 hours.  Discharge time spent: less than 30 minutes.  Signed: Tobey Grim, MD [facility] 08/01/2023

## 2023-08-01 NOTE — Plan of Care (Signed)

## 2023-08-01 NOTE — Progress Notes (Signed)
 OT Cancellation Note  Patient Details Name: Alan Hurst MRN: [id number] DOB: 03/21/1946   Cancelled Treatment:    Reason Eval/Treat Not Completed: OT screened, no needs identified, will sign off (Discussed with PT. Pt close to baseline.)  Kesa Birky,HILLARY 08/01/2023, 7:15 AM Luisa Dago, OT/L   Acute OT Clinical Specialist Acute Rehabilitation Services Pager [phone] Office [phone]

## 2023-08-01 NOTE — Progress Notes (Signed)
 Mobility Specialist: Progress Note   08/01/23 1252  Mobility  Activity Ambulated independently in hallway  Level of Assistance Independent  Assistive Device None  Distance Ambulated (ft) 250 ft  Activity Response Tolerated well  Mobility Referral Yes  Mobility visit 1 Mobility  Mobility Specialist Start Time (ACUTE ONLY) 1003  Mobility Specialist Stop Time (ACUTE ONLY) 1015  Mobility Specialist Time Calculation (min) (ACUTE ONLY) 12 min    Received pt in bed having no complaints and agreeable to mobility. Pt was asymptomatic throughout ambulation and returned to room w/o fault. Left in bed w/ call bell in reach and all needs met.   Maurene Capes Mobility Specialist Please contact via SecureChat or Rehab office at [phone]

## 2023-08-01 NOTE — Progress Notes (Addendum)
 [facility] Progress Note     Subjective: CC:  Denies abdominal pain, nausea, or vomiting. Reports flatus. Denies BM. States at baseline he has a soft, formed stool every 3-4 days without taking any stool softeners or laxatives. Denies straining to have a BM.  Objective: Vital signs in last 24 hours: Temp:  [98.5 F (36.9 C)-99.2 F (37.3 C)] 98.5 F (36.9 C) (03/23 0519) Pulse Rate:  [88-101] 97 (03/23 0519) Resp:  [15-25] 18 (03/23 0519) BP: (100-145)/(59-73) 141/71 (03/23 0519) SpO2:  [91 %-100 %] 95 % (03/23 0519)    Intake/Output from previous day: 03/22 0701 - 03/23 0700 In: 1157.6 [I.V.:1157.6] Out: 425 [Urine:425] Intake/Output this shift: No intake/output data recorded.  PE: Gen:  Alert, NAD, pleasant Card:  Regular rate and rhythm Pulm:  Normal effort ORA Abd: Soft, non-tender, non-distended, no masses Skin: warm and dry, no rashes  Psych: A&Ox3   Lab Results:  Recent Labs    07/31/23 0530 08/01/23 0716  WBC 6.0 5.2  HGB 11.4* 10.4*  HCT 34.6* 31.4*  PLT 157 103*   BMET Recent Labs    07/30/23 1838 07/31/23 0530  NA 138 137  K 3.3* 3.6  CL 106 108  CO2 17* 18*  GLUCOSE 142* 163*  BUN 25* 25*  CREATININE 1.35* 1.31*  CALCIUM 9.2 8.4*   PT/INR Recent Labs    07/30/23 1838 07/30/23 2304  LABPROT 14.6 15.2  INR 1.1 1.2   CMP     Component Value Date/Time   NA 137 07/31/2023 0530   NA 140 06/08/2023 1520   K 3.6 07/31/2023 0530   CL 108 07/31/2023 0530   CO2 18 (L) 07/31/2023 0530   GLUCOSE 163 (H) 07/31/2023 0530   BUN 25 (H) 07/31/2023 0530   BUN 23 06/08/2023 1520   CREATININE 1.31 (H) 07/31/2023 0530   CREATININE 1.27 (H) 12/26/2015 0808   CALCIUM 8.4 (L) 07/31/2023 0530   PROT 6.5 07/30/2023 1838   PROT 6.3 04/30/2023 0908   ALBUMIN 3.5 07/30/2023 1838   ALBUMIN 4.1 04/30/2023 0908   AST 26 07/30/2023 1838   ALT 20 07/30/2023 1838   ALKPHOS 57 07/30/2023 1838   BILITOT 0.7 07/30/2023 1838   BILITOT 0.4  04/30/2023 0908   GFRNONAA 56 (L) 07/31/2023 0530   GFRNONAA 57 (L) 12/26/2015 0808   GFRAA 72 02/02/2020 0908   GFRAA 66 12/26/2015 0808   Lipase  No results found for: "LIPASE"     Studies/Results: DG Abd Portable 1V-Small Bowel Obstruction Protocol-initial, 8 hr delay Result Date: 08/01/2023 CLINICAL DATA:  8 hour small-bowel delay EXAM: PORTABLE ABDOMEN - 1 VIEW COMPARISON:  CT abdomen and pelvis 07/30/2023 FINDINGS: Contrast material is demonstrated throughout the colon without significant residual small bowel contrast. No small bowel distention. Changes suggest no evidence of small-bowel obstruction. Degenerative changes in the spine. Postoperative changes in the hips. IMPRESSION: Contrast material is demonstrated throughout the colon suggesting no evidence of small-bowel obstruction. Electronically Signed   By: Burman Nieves M.D.   On: 08/01/2023 00:45   VAS [place] LOWER EXTREMITY VENOUS (DVT) Result Date: 07/31/2023  Lower Venous DVT Study Patient Name:  Alan Hurst  Date of Exam:   07/31/2023 Medical Rec #: [id number]        Accession #:    [id number] Date of Birth: 08/24/1945        Patient Gender: M Patient Age:   78 years Exam Location:  [facility] Procedure:  VAS [place] LOWER EXTREMITY VENOUS (DVT) Referring Phys: CAROLE HALL --------------------------------------------------------------------------------  Indications: Pain, and Swelling.  Risk Factors: Cancer Prostate & lung. Anticoagulation: Lovenox. Comparison Study: None. Performing Technologist: Shona Simpson  Examination Guidelines: A complete evaluation includes B-mode imaging, spectral Doppler, color Doppler, and power Doppler as needed of all accessible portions of each vessel. Bilateral testing is considered an integral part of a complete examination. Limited examinations for reoccurring indications may be performed as noted. The reflux portion of the exam is performed with the patient in reverse Trendelenburg.   +---------+---------------+---------+-----------+----------+--------------+ RIGHT    CompressibilityPhasicitySpontaneityPropertiesThrombus Aging +---------+---------------+---------+-----------+----------+--------------+ CFV      Full           Yes      Yes                                 +---------+---------------+---------+-----------+----------+--------------+ SFJ      Full                                                        +---------+---------------+---------+-----------+----------+--------------+ FV Prox  Full                                                        +---------+---------------+---------+-----------+----------+--------------+ FV Mid   Full                                                        +---------+---------------+---------+-----------+----------+--------------+ FV DistalFull                                                        +---------+---------------+---------+-----------+----------+--------------+ PFV      Full                                                        +---------+---------------+---------+-----------+----------+--------------+ POP      Full           Yes      Yes                                 +---------+---------------+---------+-----------+----------+--------------+ PTV      Full                                                        +---------+---------------+---------+-----------+----------+--------------+ PERO     Full                                                        +---------+---------------+---------+-----------+----------+--------------+   +----+---------------+---------+-----------+----------+--------------+  LEFTCompressibilityPhasicitySpontaneityPropertiesThrombus Aging +----+---------------+---------+-----------+----------+--------------+ CFV Full           Yes      Yes                                  +----+---------------+---------+-----------+----------+--------------+     Summary: RIGHT: - No evidence of deep vein thrombosis in the lower extremity. No indirect evidence of obstruction proximal to the inguinal ligament.  - No cystic structure found in the popliteal fossa.  LEFT: - No evidence of common femoral vein obstruction.   *See table(s) above for measurements and observations. Electronically signed by Lemar Livings MD on 07/31/2023 at 9:41:15 AM.    Final    CT Angio Chest PE W and/or Wo Contrast Result Date: 07/30/2023 CLINICAL DATA:  Emesis fever cough EXAM: CT ANGIOGRAPHY CHEST CT ABDOMEN AND PELVIS WITH CONTRAST TECHNIQUE: Multidetector CT imaging of the chest was performed using the standard protocol during bolus administration of intravenous contrast. Multiplanar CT image reconstructions and MIPs were obtained to evaluate the vascular anatomy. Multidetector CT imaging of the abdomen and pelvis was performed using the standard protocol during bolus administration of intravenous contrast. RADIATION DOSE REDUCTION: This exam was performed according to the departmental dose-optimization program which includes automated exposure control, adjustment of the mA and/or kV according to patient size and/or use of iterative reconstruction technique. CONTRAST:  75mL OMNIPAQUE IOHEXOL 350 MG/ML SOLN COMPARISON:  Chest x-ray 07/30/2023, CT chest 04/22/2022, CT chest abdomen pelvis 08/21/2022, PET CT 04/29/2023 FINDINGS: CTA CHEST FINDINGS Cardiovascular: Satisfactory opacification of the pulmonary arteries to the segmental level. No evidence of pulmonary embolism. Mild aortic atherosclerosis. No aneurysm or dissection. Coronary vascular calcification. Normal cardiac size. No significant pericardial effusion. Mediastinum/Nodes: Patent trachea. No thyroid mass. No suspicious lymph nodes. Esophagus within normal limits. Lungs/Pleura: Trace right pleural effusion. Emphysema. Bandlike parenchymal density at the right  apex, measures about 5.2 x 2.4 cm on series 4, image 26, probably stable but abuts the pleural surface compared to prior. No acute airspace disease. Musculoskeletal: No acute or suspicious osseous abnormality. Review of the MIP images confirms the above findings. CT ABDOMEN and PELVIS FINDINGS Hepatobiliary: Distended gallbladder without calcified stone. No biliary dilatation. Pancreas: Unremarkable. No pancreatic ductal dilatation or surrounding inflammatory changes. Spleen: Normal in size without focal abnormality. Adrenals/Urinary Tract: Stranding centered around the bilateral adrenal glands. No mass or hemorrhage at this time. Kidneys show no hydronephrosis. The bladder is partially obscured by artifact from hip hardware Stomach/Bowel: Mild fluid distension of stomach. Fluid-filled proximal small bowel with some areas of distension measuring up to 3.9 cm, no well-defined transition point but relatively decompressed distal small bowel. No acute bowel wall thickening. Vascular/Lymphatic: Aortic atherosclerosis. No enlarged abdominal or pelvic lymph nodes. Retroaortic left renal vein. Reproductive: Prostate is obscured by artifact from hip hardware Other: Negative for pelvic effusion or free air. Small fat containing inguinal hernias Musculoskeletal: No acute osseous abnormality. Multilevel degenerative changes of the spine. Bilateral hip replacements with artifact. Review of the MIP images confirms the above findings. IMPRESSION: 1. Negative for acute pulmonary embolus. 2. Trace right pleural effusion. Emphysema. Bandlike parenchymal density at the right apex is grossly stable but now abuts the pleural surface compared to prior. Continued attention on follow-up imaging recommended. 3. Fluid-filled proximal small bowel with some areas of distension measuring up to 3.9 cm, no well-defined transition point but relatively decompressed distal small bowel. Findings are favored to represent ileus but  partial or  developing obstruction not excluded. 4. Stranding centered around the bilateral adrenal glands, nonspecific and could be due to adrenal congestion. No evidence for adrenal hemorrhage at this time. 5. Aortic atherosclerosis. Electronically Signed   By: Jasmine Pang M.D.   On: 07/30/2023 21:57   CT ABDOMEN PELVIS W CONTRAST Result Date: 07/30/2023 CLINICAL DATA:  Emesis fever cough EXAM: CT ANGIOGRAPHY CHEST CT ABDOMEN AND PELVIS WITH CONTRAST TECHNIQUE: Multidetector CT imaging of the chest was performed using the standard protocol during bolus administration of intravenous contrast. Multiplanar CT image reconstructions and MIPs were obtained to evaluate the vascular anatomy. Multidetector CT imaging of the abdomen and pelvis was performed using the standard protocol during bolus administration of intravenous contrast. RADIATION DOSE REDUCTION: This exam was performed according to the departmental dose-optimization program which includes automated exposure control, adjustment of the mA and/or kV according to patient size and/or use of iterative reconstruction technique. CONTRAST:  75mL OMNIPAQUE IOHEXOL 350 MG/ML SOLN COMPARISON:  Chest x-ray 07/30/2023, CT chest 04/22/2022, CT chest abdomen pelvis 08/21/2022, PET CT 04/29/2023 FINDINGS: CTA CHEST FINDINGS Cardiovascular: Satisfactory opacification of the pulmonary arteries to the segmental level. No evidence of pulmonary embolism. Mild aortic atherosclerosis. No aneurysm or dissection. Coronary vascular calcification. Normal cardiac size. No significant pericardial effusion. Mediastinum/Nodes: Patent trachea. No thyroid mass. No suspicious lymph nodes. Esophagus within normal limits. Lungs/Pleura: Trace right pleural effusion. Emphysema. Bandlike parenchymal density at the right apex, measures about 5.2 x 2.4 cm on series 4, image 26, probably stable but abuts the pleural surface compared to prior. No acute airspace disease. Musculoskeletal: No acute or  suspicious osseous abnormality. Review of the MIP images confirms the above findings. CT ABDOMEN and PELVIS FINDINGS Hepatobiliary: Distended gallbladder without calcified stone. No biliary dilatation. Pancreas: Unremarkable. No pancreatic ductal dilatation or surrounding inflammatory changes. Spleen: Normal in size without focal abnormality. Adrenals/Urinary Tract: Stranding centered around the bilateral adrenal glands. No mass or hemorrhage at this time. Kidneys show no hydronephrosis. The bladder is partially obscured by artifact from hip hardware Stomach/Bowel: Mild fluid distension of stomach. Fluid-filled proximal small bowel with some areas of distension measuring up to 3.9 cm, no well-defined transition point but relatively decompressed distal small bowel. No acute bowel wall thickening. Vascular/Lymphatic: Aortic atherosclerosis. No enlarged abdominal or pelvic lymph nodes. Retroaortic left renal vein. Reproductive: Prostate is obscured by artifact from hip hardware Other: Negative for pelvic effusion or free air. Small fat containing inguinal hernias Musculoskeletal: No acute osseous abnormality. Multilevel degenerative changes of the spine. Bilateral hip replacements with artifact. Review of the MIP images confirms the above findings. IMPRESSION: 1. Negative for acute pulmonary embolus. 2. Trace right pleural effusion. Emphysema. Bandlike parenchymal density at the right apex is grossly stable but now abuts the pleural surface compared to prior. Continued attention on follow-up imaging recommended. 3. Fluid-filled proximal small bowel with some areas of distension measuring up to 3.9 cm, no well-defined transition point but relatively decompressed distal small bowel. Findings are favored to represent ileus but partial or developing obstruction not excluded. 4. Stranding centered around the bilateral adrenal glands, nonspecific and could be due to adrenal congestion. No evidence for adrenal hemorrhage at  this time. 5. Aortic atherosclerosis. Electronically Signed   By: Jasmine Pang M.D.   On: 07/30/2023 21:57   DG Chest Port 1 View Result Date: 07/30/2023 CLINICAL DATA:  Questionable sepsis - evaluate for abnormality EXAM: PORTABLE CHEST 1 VIEW COMPARISON:  Chest x-ray 04/22/2022 FINDINGS: The heart and mediastinal contours  are unchanged. Atherosclerotic plaque. Left base atelectasis. No focal consolidation. No pulmonary edema. No pleural effusion. No pneumothorax. No acute osseous abnormality. IMPRESSION: 1. No active disease. 2.  Aortic Atherosclerosis (ICD10-I70.0). Electronically Signed   By: Tish Frederickson M.D.   On: 07/30/2023 19:46    Anti-infectives: Anti-infectives (From admission, onward)    Start     Dose/Rate Route Frequency Ordered Stop   07/31/23 0100  vancomycin (VANCOREADY) IVPB 1500 mg/300 mL        1,500 mg 150 mL/hr over 120 Minutes Intravenous  Once 07/31/23 0041 07/31/23 0500   07/31/23 0045  metroNIDAZOLE (FLAGYL) IVPB 500 mg        500 mg 100 mL/hr over 60 Minutes Intravenous  Once 07/31/23 0034 07/31/23 0230   07/30/23 1915  cefTRIAXone (ROCEPHIN) 1 g in sodium chloride 0.9 % 100 mL IVPB        1 g 200 mL/hr over 30 Minutes Intravenous  Once 07/30/23 1909 07/30/23 2134        Assessment/Plan  SBO vs ileus SBO protocol >> Contrast in colon on x-ray this morning. Having flatus. He does not have signs of bowel obstruction. Will start FLD and advance to SOFT at lunch.   No acute surgical needs. We will sign off. Call as needed.     LOS: 1 day   I reviewed nursing notes, hospitalist notes, last 24 h vitals and pain scores, last 48 h intake and output, last 24 h labs and trends, and last 24 h imaging results.  This care required straight-forward level of medical decision making.   Hosie Spangle, PA-C [facility] Please see Amion for pager number during day hours 7:00am-4:30pm

## 2023-08-02 ENCOUNTER — Telehealth: Payer: Self-pay

## 2023-08-02 NOTE — Transitions of Care (Post Inpatient/ED Visit) (Signed)
   08/02/2023  Name: Alan Hurst MRN: [id number] DOB: 1945-07-10  Today's TOC FU Call Status: Today's TOC FU Call Status:: Unsuccessful Call (1st Attempt) Unsuccessful Call (1st Attempt) Date: 08/02/23  Attempted to reach the patient regarding the most recent Inpatient/ED visit.  Follow Up Plan: Additional outreach attempts will be made to reach the patient to complete the Transitions of Care (Post Inpatient/ED visit) call.   Lonia Chimera, RN, BSN, CEN [employer]- Transition of Care Team.  [facility] [phone]

## 2023-08-03 ENCOUNTER — Telehealth: Payer: Self-pay | Admitting: *Deleted

## 2023-08-03 NOTE — Transitions of Care (Post Inpatient/ED Visit) (Signed)
 08/03/2023  Name: Alan Hurst MRN: [id number] DOB: 1945/06/09  Today's TOC FU Call Status: Today's TOC FU Call Status:: Successful TOC FU Call Completed TOC FU Call Complete Date: 08/03/23 Patient's Name and Date of Birth confirmed.  Transition Care Management Follow-up Telephone Call Date of Discharge: 08/01/23 Discharge Facility: [facility] 121) Type of Discharge: Inpatient Admission Primary Inpatient Discharge Diagnosis:: Intractable nausea and vomiting How have you been since you were released from the hospital?: Better (I feel real weak) Any questions or concerns?: No  Items Reviewed: Did you receive and understand the discharge instructions provided?: Yes Medications obtained,verified, and reconciled?: Yes (Medications Reviewed) Any new allergies since your discharge?: No Dietary orders reviewed?: No Do you have support at home?: Yes People in Home: spouse Name of Support/Comfort Primary Source: Ers[employer]b  Medications Reviewed Today: Medications Reviewed Today     Reviewed by Luella Cook, RN (Case Manager) on 08/03/23 at 1652  Med List Status: <None>   Medication Order Taking? Sig Documenting Provider Last Dose Status Informant  abiraterone acetate (ZYTIGA) 250 MG tablet [id number] Yes Take 1,000 mg by mouth daily. [provider] Taking Active Self, Pharmacy Records, Multiple Informants  Ascorbic Acid (VITAMIN C) 1000 MG tablet [id number] Yes Take 1,000 mg by mouth daily. [provider] Taking Active Self, Pharmacy Records, Multiple Informants  aspirin EC 81 MG tablet [id number] Yes Take 81 mg by mouth daily. [provider] Taking Active Self, Pharmacy Records, Multiple Informants  azithromycin (ZITHROMAX) 250 MG tablet [id number] Yes Take 250 mg by mouth every other day. [provider] Taking Active Self, Pharmacy Records, Multiple Informants  beclomethasone (QVAR REDIHALER) 40 MCG/ACT inhaler [id number] Yes Inhale 1 puff into the  lungs 2 (two) times daily. Melida Quitter, PA Taking Active Self, Pharmacy Records, Multiple Informants  Cholecalciferol (VITAMIN D) 2000 units tablet [id number] Yes Take 2,000 Units by mouth daily. [provider] Taking Active Self, Pharmacy Records, Multiple Informants           Med Note (LEE, NICOLE   Sat Jul 31, 2023  2:09 AM)    furosemide (LASIX) 40 MG tablet [id number] No Take 1 tablet (40 mg total) by mouth as needed. Please take daily as needed for greater than 3 lbs a day or 5 lbs in a week  Patient not taking: Reported on 08/01/2023   Little Ishikawa, MD Not Taking Active Self, Pharmacy Records, Multiple Informants           Med Note Sharrie Rothman Aug 01, 2023  9:26 AM) Malva Cogan needed   [place] Lanius (OMEGA-3) 500 MG CAPS [id number] Yes Take 1 capsule by mouth daily. [provider] Taking Active Self, Pharmacy Records, Multiple Informants  levalbuterol [facility] HFA) 45 MCG/ACT inhaler [id number] Yes Inhale 2 puffs into the lungs every 4 (four) hours as needed for wheezing or shortness of breath. Marisue Brooklyn, MD Taking Active Self, Pharmacy Records, Multiple Informants  losartan (COZAAR) 25 MG tablet [id number] Yes Take 1 tablet (25 mg total) by mouth daily. Little Ishikawa, MD Taking Active Self, Pharmacy Records, Multiple Informants  lovastatin (MEVACOR) 40 MG tablet [id number] Yes TAKE 1 TABLET BY MOUTH AT  BEDTIME Melida Quitter, PA Taking Active Self, Pharmacy Records, Multiple Informants  magnesium oxide (MAG-OX) 400 MG tablet [id number] Yes Take 400 mg by mouth daily. [provider] Taking Active Self, Pharmacy Records, Multiple Informants  Melatonin 10 MG TABS [id number] Yes Take 10 mg by mouth at bedtime.  [provider] Taking Active Self, Pharmacy Records, Multiple Informants           Med Note Azucena Fallen   ZOX Jul 31, 2023  4:17 PM)    metFORMIN (GLUCOPHAGE) 500 MG tablet [id number] Yes TAKE 1 TABLET BY  MOUTH DAILY Melida Quitter, PA Taking Active Self, Pharmacy Records, Multiple Informants  metoprolol succinate (TOPROL-XL) 25 MG 24 hr tablet [id number] Yes Take 1 tablet (25 mg total) by mouth daily. Melida Quitter, PA Taking Active Self, Pharmacy Records, Multiple Informants  omeprazole (PRILOSEC) 20 MG capsule [id number] Yes Take 1 capsule (20 mg total) by mouth daily. Melida Quitter, PA Taking Active Self, Pharmacy Records, Multiple Informants  Tiotropium Bromide-Olodaterol (STIOLTO RESPIMAT) 2.5-2.5 MCG/ACT AERS [id number] Yes Inhale 1 puff into the lungs daily at 12 noon. [provider] Taking Active Self, Pharmacy Records, Multiple Informants           Med Note Cheron Schaumann, Dannielle Huh Aug 01, 2023  9:28 AM) Dolores Lory from [facility]  vitamin B-12 (CYANOCOBALAMIN) 500 MCG tablet [id number] Yes Take 500 mcg by mouth daily. [provider] Taking Active Self, Pharmacy Records, Multiple Informants            Home Care and Equipment/Supplies: Were Home Health Services Ordered?: NA Any new equipment or medical supplies ordered?: NA  Functional Questionnaire: Do you need assistance with bathing/showering or dressing?: No Do you need assistance with meal preparation?: No Do you need assistance with eating?: No Do you have difficulty maintaining continence: No Do you need assistance with getting out of bed/getting out of a chair/moving?: No Do you have difficulty managing or taking your medications?: No  Follow up appointments reviewed: PCP Follow-up appointment confirmed?: Yes Date of PCP follow-up appointment?: 08/12/23 Follow-up Provider: Lenor Coffin Specialist [facility] Follow-up appointment confirmed?: NA Do you need transportation to your follow-up appointment?: No Do you understand care options if your condition(s) worsen?: Yes-patient verbalized understanding  SDOH Interventions Today    Flowsheet Row Most Recent Value  SDOH Interventions   Food  Insecurity Interventions Intervention Not Indicated  Housing Interventions Intervention Not Indicated  Transportation Interventions Intervention Not Indicated  Utilities Interventions Intervention Not Indicated      Interventions Today    Flowsheet Row Most Recent Value  Chronic Disease   Chronic disease during today's visit Other  [Intractable nausea and vomiting]  General Interventions   General Interventions Discussed/Reviewed General Interventions Discussed, General Interventions Reviewed, Doctor Visits  Doctor Visits Discussed/Reviewed Doctor Visits Discussed, Doctor Visits Reviewed, PCP  PCP/Specialist Visits Compliance with follow-up visit  Education Interventions   Education Provided Provided Education  Provided Verbal Education On Other  [How to get DME equipment for future use.]  Pharmacy Interventions   Pharmacy Dicussed/Reviewed Pharmacy Topics Discussed, Pharmacy Topics Reviewed        Gean Maidens BSN RN [facility] Health Care Management Coordinator [email or web address] Direct Dial: [phone]  Fax: [phone] Website: [email or web address]

## 2023-08-04 DIAGNOSIS — Z510 Encounter for antineoplastic radiation therapy: Secondary | ICD-10-CM | POA: Diagnosis not present

## 2023-08-04 DIAGNOSIS — C3411 Malignant neoplasm of upper lobe, right bronchus or lung: Secondary | ICD-10-CM | POA: Diagnosis not present

## 2023-08-04 LAB — CULTURE, BLOOD (ROUTINE X 2)
Culture: NO GROWTH
Culture: NO GROWTH
Special Requests: ADEQUATE
Special Requests: ADEQUATE

## 2023-08-05 DIAGNOSIS — C3411 Malignant neoplasm of upper lobe, right bronchus or lung: Secondary | ICD-10-CM | POA: Diagnosis not present

## 2023-08-05 DIAGNOSIS — Z510 Encounter for antineoplastic radiation therapy: Secondary | ICD-10-CM | POA: Diagnosis not present

## 2023-08-06 DIAGNOSIS — C3411 Malignant neoplasm of upper lobe, right bronchus or lung: Secondary | ICD-10-CM | POA: Diagnosis not present

## 2023-08-06 DIAGNOSIS — Z510 Encounter for antineoplastic radiation therapy: Secondary | ICD-10-CM | POA: Diagnosis not present

## 2023-08-09 DIAGNOSIS — Z510 Encounter for antineoplastic radiation therapy: Secondary | ICD-10-CM | POA: Diagnosis not present

## 2023-08-09 DIAGNOSIS — C3411 Malignant neoplasm of upper lobe, right bronchus or lung: Secondary | ICD-10-CM | POA: Diagnosis not present

## 2023-08-10 DIAGNOSIS — C786 Secondary malignant neoplasm of retroperitoneum and peritoneum: Secondary | ICD-10-CM | POA: Diagnosis not present

## 2023-08-10 DIAGNOSIS — Z923 Personal history of irradiation: Secondary | ICD-10-CM | POA: Diagnosis not present

## 2023-08-10 DIAGNOSIS — Z85118 Personal history of other malignant neoplasm of bronchus and lung: Secondary | ICD-10-CM | POA: Diagnosis not present

## 2023-08-10 DIAGNOSIS — Z191 Hormone sensitive malignancy status: Secondary | ICD-10-CM | POA: Diagnosis not present

## 2023-08-10 DIAGNOSIS — Z510 Encounter for antineoplastic radiation therapy: Secondary | ICD-10-CM | POA: Diagnosis not present

## 2023-08-10 DIAGNOSIS — C772 Secondary and unspecified malignant neoplasm of intra-abdominal lymph nodes: Secondary | ICD-10-CM | POA: Diagnosis not present

## 2023-08-10 DIAGNOSIS — Z96643 Presence of artificial hip joint, bilateral: Secondary | ICD-10-CM | POA: Diagnosis not present

## 2023-08-10 DIAGNOSIS — C775 Secondary and unspecified malignant neoplasm of intrapelvic lymph nodes: Secondary | ICD-10-CM | POA: Diagnosis not present

## 2023-08-11 DIAGNOSIS — Z510 Encounter for antineoplastic radiation therapy: Secondary | ICD-10-CM | POA: Diagnosis not present

## 2023-08-11 DIAGNOSIS — Z191 Hormone sensitive malignancy status: Secondary | ICD-10-CM | POA: Diagnosis not present

## 2023-08-11 DIAGNOSIS — C775 Secondary and unspecified malignant neoplasm of intrapelvic lymph nodes: Secondary | ICD-10-CM | POA: Diagnosis not present

## 2023-08-11 DIAGNOSIS — Z96643 Presence of artificial hip joint, bilateral: Secondary | ICD-10-CM | POA: Diagnosis not present

## 2023-08-11 DIAGNOSIS — C772 Secondary and unspecified malignant neoplasm of intra-abdominal lymph nodes: Secondary | ICD-10-CM | POA: Diagnosis not present

## 2023-08-11 DIAGNOSIS — Z85118 Personal history of other malignant neoplasm of bronchus and lung: Secondary | ICD-10-CM | POA: Diagnosis not present

## 2023-08-11 DIAGNOSIS — Z923 Personal history of irradiation: Secondary | ICD-10-CM | POA: Diagnosis not present

## 2023-08-11 DIAGNOSIS — C786 Secondary malignant neoplasm of retroperitoneum and peritoneum: Secondary | ICD-10-CM | POA: Diagnosis not present

## 2023-08-12 ENCOUNTER — Encounter: Payer: Self-pay | Admitting: Family Medicine

## 2023-08-12 ENCOUNTER — Ambulatory Visit (INDEPENDENT_AMBULATORY_CARE_PROVIDER_SITE_OTHER): Admitting: Family Medicine

## 2023-08-12 VITALS — BP 147/74 | HR 73 | Ht 69.0 in | Wt 185.1 lb

## 2023-08-12 DIAGNOSIS — C772 Secondary and unspecified malignant neoplasm of intra-abdominal lymph nodes: Secondary | ICD-10-CM | POA: Diagnosis not present

## 2023-08-12 DIAGNOSIS — D649 Anemia, unspecified: Secondary | ICD-10-CM | POA: Diagnosis not present

## 2023-08-12 DIAGNOSIS — C3411 Malignant neoplasm of upper lobe, right bronchus or lung: Secondary | ICD-10-CM | POA: Diagnosis not present

## 2023-08-12 DIAGNOSIS — C786 Secondary malignant neoplasm of retroperitoneum and peritoneum: Secondary | ICD-10-CM | POA: Diagnosis not present

## 2023-08-12 DIAGNOSIS — E539 Vitamin B deficiency, unspecified: Secondary | ICD-10-CM | POA: Diagnosis not present

## 2023-08-12 DIAGNOSIS — J449 Chronic obstructive pulmonary disease, unspecified: Secondary | ICD-10-CM | POA: Diagnosis not present

## 2023-08-12 DIAGNOSIS — C775 Secondary and unspecified malignant neoplasm of intrapelvic lymph nodes: Secondary | ICD-10-CM | POA: Diagnosis not present

## 2023-08-12 DIAGNOSIS — Z7984 Long term (current) use of oral hypoglycemic drugs: Secondary | ICD-10-CM

## 2023-08-12 DIAGNOSIS — N1831 Chronic kidney disease, stage 3a: Secondary | ICD-10-CM | POA: Diagnosis not present

## 2023-08-12 DIAGNOSIS — R112 Nausea with vomiting, unspecified: Secondary | ICD-10-CM | POA: Diagnosis not present

## 2023-08-12 DIAGNOSIS — Z85118 Personal history of other malignant neoplasm of bronchus and lung: Secondary | ICD-10-CM | POA: Diagnosis not present

## 2023-08-12 DIAGNOSIS — I5022 Chronic systolic (congestive) heart failure: Secondary | ICD-10-CM

## 2023-08-12 DIAGNOSIS — Z923 Personal history of irradiation: Secondary | ICD-10-CM | POA: Diagnosis not present

## 2023-08-12 DIAGNOSIS — E0822 Diabetes mellitus due to underlying condition with diabetic chronic kidney disease: Secondary | ICD-10-CM

## 2023-08-12 DIAGNOSIS — Z96643 Presence of artificial hip joint, bilateral: Secondary | ICD-10-CM | POA: Diagnosis not present

## 2023-08-12 DIAGNOSIS — E119 Type 2 diabetes mellitus without complications: Secondary | ICD-10-CM | POA: Diagnosis not present

## 2023-08-12 DIAGNOSIS — Z510 Encounter for antineoplastic radiation therapy: Secondary | ICD-10-CM | POA: Diagnosis not present

## 2023-08-12 DIAGNOSIS — Z191 Hormone sensitive malignancy status: Secondary | ICD-10-CM | POA: Diagnosis not present

## 2023-08-12 LAB — POCT GLYCOSYLATED HEMOGLOBIN (HGB A1C): HbA1c POC (<> result, manual entry): 6 % (ref 4.0–5.6)

## 2023-08-12 MED ORDER — LEVALBUTEROL TARTRATE 45 MCG/ACT IN AERO: 2.0000 | INHALATION_SPRAY | RESPIRATORY_TRACT | 11 refills | Status: DC | PRN

## 2023-08-12 NOTE — Patient Instructions (Addendum)
 It was nice to see you today,  We addressed the following topics today: -I will put in an order for a nebulizer, and a rolling walker.  I am also refilling your levealbuterol. - Try to get some compression stockings over-the-counter to help with your leg swelling - Your calves were 15.5 inches and your ankles were 11.5 inches.  Use this to determine the size you need of your compression stockings. - If your leg swelling significantly worsens, or especially if you have any difficulty breathing go ahead and take the Lasix.  Take it for 2 days and then let either me or your cardiologist know.  Have a great day,  Frederic Jericho, MD

## 2023-08-12 NOTE — Progress Notes (Signed)
 Established Patient Office Visit  Subjective   Patient ID: Alan Hurst, male    DOB: Dec 06, 1945  Age: 78 y.o. MRN: [id number]  Chief Complaint  Patient presents with   Medical Management of Chronic Issues    HPI  Patient here for hospital follow-up of recent nausea and vomiting and to rule out ileus/SBO.  Patient sees cardiology.  He is prescribed Lasix but takes it as needed.  Patient complaining of worsening leg swelling.  Discussed potential causes of leg swelling including cardiac and venous insufficiency.  Patient request rolling walker to help with ambulation.  Patient also requesting nebulizer.  Currently takes levalbuterol inhaler.  In the hospital he was given a nebulizer and felt like this was more effective in treating his shortness of breath.    The ASCVD Risk score (Arnett DK, et al., 2019) failed to calculate for the following reasons:   Risk score cannot be calculated because patient has a medical history suggesting prior/existing ASCVD  Health Maintenance Due  Topic Date Due   Zoster Vaccines- Shingrix (1 of 2) Never done   OPHTHALMOLOGY EXAM  01/07/2022   Colonoscopy  06/19/2022   COVID-19 Vaccine (4 - 2024-25 season) 01/10/2023      Objective:     BP (!) 147/74   Pulse 73   Ht 5\' 9"  (1.753 m)   Wt 185 lb 1.9 oz (84 kg)   SpO2 97%   BMI 27.34 kg/m    Physical Exam General: Alert, oriented CV: Regular rate and rhythm Pulmonary: Lungs clear bilaterally Extremities: Mild pitting edema bilaterally.     Assessment & Plan:   Chronic systolic CHF (congestive heart failure), NYHA class 1 (HCC) Assessment & Plan: Continue to follow with cardiology.  Discussed when it is appropriate to use his Lasix.  Advised him on using compression stockings as well.  Measured his calf and ankle size and advised him to find the appropriate size over-the-counter.  Sending in rolling walker with seat order.  Patient would benefit from walker with seat so he can  rest when getting dyspnea on exertion.  Orders: -     For home use only DME 4 wheeled rolling walker with seat  Stage 3a chronic kidney disease (HCC) -     Comprehensive metabolic panel with GFR -     Magnesium  Vitamin B deficiency -     B12 and Folate Panel  Anemia, unspecified type -     Iron, TIBC and Ferritin Panel -     CBC with Differential/Platelet  Type 2 diabetes mellitus without complication, without long-term current use of insulin (HCC) -     POCT glycosylated hemoglobin (Hb A1C) -     Microalbumin / creatinine urine ratio  Chronic obstructive pulmonary disease, unspecified COPD type (HCC) Assessment & Plan: Will send in nebulizer with DuoNebs to use if inhaler insufficient at home.  Orders: -     For home use only DME Nebulizer machine -     For home use only DME 4 wheeled rolling walker with seat  Intractable nausea and vomiting Assessment & Plan: Resolved.  No issues since discharge from hospital.   Other orders -     Levalbuterol Tartrate; Inhale 2 puffs into the lungs every 4 (four) hours as needed for wheezing or shortness of breath.  Dispense: 1 each; Refill: 11 -     Levalbuterol HCl; Take 3 mLs (0.63 mg total) by nebulization once for 1 dose.  Dispense: 3 mL; Refill: 12  Return in about 4 months (around 12/12/2023) for DM, hld.    Sandre Kitty, MD

## 2023-08-13 DIAGNOSIS — C775 Secondary and unspecified malignant neoplasm of intrapelvic lymph nodes: Secondary | ICD-10-CM | POA: Diagnosis not present

## 2023-08-13 DIAGNOSIS — Z923 Personal history of irradiation: Secondary | ICD-10-CM | POA: Diagnosis not present

## 2023-08-13 DIAGNOSIS — C786 Secondary malignant neoplasm of retroperitoneum and peritoneum: Secondary | ICD-10-CM | POA: Diagnosis not present

## 2023-08-13 DIAGNOSIS — Z191 Hormone sensitive malignancy status: Secondary | ICD-10-CM | POA: Diagnosis not present

## 2023-08-13 DIAGNOSIS — C772 Secondary and unspecified malignant neoplasm of intra-abdominal lymph nodes: Secondary | ICD-10-CM | POA: Diagnosis not present

## 2023-08-13 DIAGNOSIS — Z510 Encounter for antineoplastic radiation therapy: Secondary | ICD-10-CM | POA: Diagnosis not present

## 2023-08-13 DIAGNOSIS — Z85118 Personal history of other malignant neoplasm of bronchus and lung: Secondary | ICD-10-CM | POA: Diagnosis not present

## 2023-08-13 DIAGNOSIS — Z96643 Presence of artificial hip joint, bilateral: Secondary | ICD-10-CM | POA: Diagnosis not present

## 2023-08-13 LAB — CBC WITH DIFFERENTIAL/PLATELET
Basophils Absolute: 0.1 10*3/uL (ref 0.0–0.2)
Basos: 1 %
EOS (ABSOLUTE): 0.4 10*3/uL (ref 0.0–0.4)
Eos: 7 %
Hematocrit: 32.2 % — ABNORMAL LOW (ref 37.5–51.0)
Hemoglobin: 10.5 g/dL — ABNORMAL LOW (ref 13.0–17.7)
Immature Grans (Abs): 0 10*3/uL (ref 0.0–0.1)
Immature Granulocytes: 0 %
Lymphocytes Absolute: 1.3 10*3/uL (ref 0.7–3.1)
Lymphs: 25 %
MCH: 27.6 pg (ref 26.6–33.0)
MCHC: 32.6 g/dL (ref 31.5–35.7)
MCV: 85 fL (ref 79–97)
Monocytes Absolute: 0.7 10*3/uL (ref 0.1–0.9)
Monocytes: 13 %
Neutrophils Absolute: 2.9 10*3/uL (ref 1.4–7.0)
Neutrophils: 54 %
Platelets: 371 10*3/uL (ref 150–450)
RBC: 3.81 x10E6/uL — ABNORMAL LOW (ref 4.14–5.80)
RDW: 13.9 % (ref 11.6–15.4)
WBC: 5.3 10*3/uL (ref 3.4–10.8)

## 2023-08-13 LAB — MICROALBUMIN / CREATININE URINE RATIO
Creatinine, Urine: 100.7 mg/dL
Microalb/Creat Ratio: <3 mg/g{creat} (ref 0–29)
Microalbumin, Urine: <3 ug/mL

## 2023-08-13 LAB — COMPREHENSIVE METABOLIC PANEL WITH GFR
ALT: 18 IU/L (ref 0–44)
AST: 21 IU/L (ref 0–40)
Albumin: 3.6 g/dL — ABNORMAL LOW (ref 3.8–4.8)
Alkaline Phosphatase: 82 IU/L (ref 44–121)
BUN/Creatinine Ratio: 9 — ABNORMAL LOW (ref 10–24)
BUN: 11 mg/dL (ref 8–27)
Bilirubin Total: 0.4 mg/dL (ref 0.0–1.2)
CO2: 21 mmol/L (ref 20–29)
Calcium: 9 mg/dL (ref 8.6–10.2)
Chloride: 106 mmol/L (ref 96–106)
Creatinine, Ser: 1.21 mg/dL (ref 0.76–1.27)
Globulin, Total: 2.3 g/dL (ref 1.5–4.5)
Glucose: 78 mg/dL (ref 70–99)
Potassium: 3.9 mmol/L (ref 3.5–5.2)
Sodium: 140 mmol/L (ref 134–144)
Total Protein: 5.9 g/dL — ABNORMAL LOW (ref 6.0–8.5)
eGFR: 62 mL/min/{1.73_m2} (ref >59)

## 2023-08-13 LAB — IRON,TIBC AND FERRITIN PANEL
Ferritin: 99 ng/mL (ref 30–400)
Iron Saturation: 25 % (ref 15–55)
Iron: 77 ug/dL (ref 38–169)
Total Iron Binding Capacity: 303 ug/dL (ref 250–450)
UIBC: 226 ug/dL (ref 111–343)

## 2023-08-13 LAB — MAGNESIUM: Magnesium: 2.1 mg/dL (ref 1.6–2.3)

## 2023-08-13 LAB — B12 AND FOLATE PANEL
Folate: >20 ng/mL (ref >3.0)
Vitamin B-12: 881 pg/mL (ref 232–1245)

## 2023-08-16 DIAGNOSIS — C775 Secondary and unspecified malignant neoplasm of intrapelvic lymph nodes: Secondary | ICD-10-CM | POA: Diagnosis not present

## 2023-08-16 DIAGNOSIS — C786 Secondary malignant neoplasm of retroperitoneum and peritoneum: Secondary | ICD-10-CM | POA: Diagnosis not present

## 2023-08-16 DIAGNOSIS — Z923 Personal history of irradiation: Secondary | ICD-10-CM | POA: Diagnosis not present

## 2023-08-16 DIAGNOSIS — Z191 Hormone sensitive malignancy status: Secondary | ICD-10-CM | POA: Diagnosis not present

## 2023-08-16 DIAGNOSIS — Z85118 Personal history of other malignant neoplasm of bronchus and lung: Secondary | ICD-10-CM | POA: Diagnosis not present

## 2023-08-16 DIAGNOSIS — Z96643 Presence of artificial hip joint, bilateral: Secondary | ICD-10-CM | POA: Diagnosis not present

## 2023-08-16 DIAGNOSIS — Z510 Encounter for antineoplastic radiation therapy: Secondary | ICD-10-CM | POA: Diagnosis not present

## 2023-08-16 DIAGNOSIS — C772 Secondary and unspecified malignant neoplasm of intra-abdominal lymph nodes: Secondary | ICD-10-CM | POA: Diagnosis not present

## 2023-08-16 MED ORDER — LEVALBUTEROL HCL 0.63 MG/3ML IN NEBU: 0.6300 mg | INHALATION_SOLUTION | Freq: Once | RESPIRATORY_TRACT | 12 refills | Status: DC

## 2023-08-16 NOTE — Assessment & Plan Note (Signed)
 Will send in nebulizer with DuoNebs to use if inhaler insufficient at home.

## 2023-08-16 NOTE — Assessment & Plan Note (Signed)
 Resolved.  No issues since discharge from hospital.

## 2023-08-16 NOTE — Assessment & Plan Note (Signed)
 Continue to follow with cardiology.  Discussed when it is appropriate to use his Lasix.  Advised him on using compression stockings as well.  Measured his calf and ankle size and advised him to find the appropriate size over-the-counter.  Sending in rolling walker with seat order.  Patient would benefit from walker with seat so he can rest when getting dyspnea on exertion.

## 2023-08-17 DIAGNOSIS — Z923 Personal history of irradiation: Secondary | ICD-10-CM | POA: Diagnosis not present

## 2023-08-17 DIAGNOSIS — Z191 Hormone sensitive malignancy status: Secondary | ICD-10-CM | POA: Diagnosis not present

## 2023-08-17 DIAGNOSIS — Z510 Encounter for antineoplastic radiation therapy: Secondary | ICD-10-CM | POA: Diagnosis not present

## 2023-08-17 DIAGNOSIS — C772 Secondary and unspecified malignant neoplasm of intra-abdominal lymph nodes: Secondary | ICD-10-CM | POA: Diagnosis not present

## 2023-08-17 DIAGNOSIS — Z85118 Personal history of other malignant neoplasm of bronchus and lung: Secondary | ICD-10-CM | POA: Diagnosis not present

## 2023-08-17 DIAGNOSIS — C775 Secondary and unspecified malignant neoplasm of intrapelvic lymph nodes: Secondary | ICD-10-CM | POA: Diagnosis not present

## 2023-08-17 DIAGNOSIS — C786 Secondary malignant neoplasm of retroperitoneum and peritoneum: Secondary | ICD-10-CM | POA: Diagnosis not present

## 2023-08-17 DIAGNOSIS — Z96643 Presence of artificial hip joint, bilateral: Secondary | ICD-10-CM | POA: Diagnosis not present

## 2023-08-18 DIAGNOSIS — Z510 Encounter for antineoplastic radiation therapy: Secondary | ICD-10-CM | POA: Diagnosis not present

## 2023-08-18 DIAGNOSIS — C772 Secondary and unspecified malignant neoplasm of intra-abdominal lymph nodes: Secondary | ICD-10-CM | POA: Diagnosis not present

## 2023-08-18 DIAGNOSIS — Z923 Personal history of irradiation: Secondary | ICD-10-CM | POA: Diagnosis not present

## 2023-08-18 DIAGNOSIS — Z96643 Presence of artificial hip joint, bilateral: Secondary | ICD-10-CM | POA: Diagnosis not present

## 2023-08-18 DIAGNOSIS — C775 Secondary and unspecified malignant neoplasm of intrapelvic lymph nodes: Secondary | ICD-10-CM | POA: Diagnosis not present

## 2023-08-18 DIAGNOSIS — C786 Secondary malignant neoplasm of retroperitoneum and peritoneum: Secondary | ICD-10-CM | POA: Diagnosis not present

## 2023-08-18 DIAGNOSIS — Z85118 Personal history of other malignant neoplasm of bronchus and lung: Secondary | ICD-10-CM | POA: Diagnosis not present

## 2023-08-18 DIAGNOSIS — Z191 Hormone sensitive malignancy status: Secondary | ICD-10-CM | POA: Diagnosis not present

## 2023-08-19 ENCOUNTER — Other Ambulatory Visit: Payer: Self-pay | Admitting: Family Medicine

## 2023-08-19 DIAGNOSIS — Z923 Personal history of irradiation: Secondary | ICD-10-CM | POA: Diagnosis not present

## 2023-08-19 DIAGNOSIS — C786 Secondary malignant neoplasm of retroperitoneum and peritoneum: Secondary | ICD-10-CM | POA: Diagnosis not present

## 2023-08-19 DIAGNOSIS — C775 Secondary and unspecified malignant neoplasm of intrapelvic lymph nodes: Secondary | ICD-10-CM | POA: Diagnosis not present

## 2023-08-19 DIAGNOSIS — Z510 Encounter for antineoplastic radiation therapy: Secondary | ICD-10-CM | POA: Diagnosis not present

## 2023-08-19 DIAGNOSIS — Z96643 Presence of artificial hip joint, bilateral: Secondary | ICD-10-CM | POA: Diagnosis not present

## 2023-08-19 DIAGNOSIS — Z85118 Personal history of other malignant neoplasm of bronchus and lung: Secondary | ICD-10-CM | POA: Diagnosis not present

## 2023-08-19 DIAGNOSIS — Z191 Hormone sensitive malignancy status: Secondary | ICD-10-CM | POA: Diagnosis not present

## 2023-08-19 DIAGNOSIS — C772 Secondary and unspecified malignant neoplasm of intra-abdominal lymph nodes: Secondary | ICD-10-CM | POA: Diagnosis not present

## 2023-08-19 MED ORDER — LEVALBUTEROL HCL 0.63 MG/3ML IN NEBU: 0.6300 mg | INHALATION_SOLUTION | Freq: Three times a day (TID) | RESPIRATORY_TRACT | 5 refills | Status: DC | PRN

## 2023-08-20 DIAGNOSIS — C786 Secondary malignant neoplasm of retroperitoneum and peritoneum: Secondary | ICD-10-CM | POA: Diagnosis not present

## 2023-08-20 DIAGNOSIS — Z96643 Presence of artificial hip joint, bilateral: Secondary | ICD-10-CM | POA: Diagnosis not present

## 2023-08-20 DIAGNOSIS — Z191 Hormone sensitive malignancy status: Secondary | ICD-10-CM | POA: Diagnosis not present

## 2023-08-20 DIAGNOSIS — Z85118 Personal history of other malignant neoplasm of bronchus and lung: Secondary | ICD-10-CM | POA: Diagnosis not present

## 2023-08-20 DIAGNOSIS — Z923 Personal history of irradiation: Secondary | ICD-10-CM | POA: Diagnosis not present

## 2023-08-20 DIAGNOSIS — Z510 Encounter for antineoplastic radiation therapy: Secondary | ICD-10-CM | POA: Diagnosis not present

## 2023-08-20 DIAGNOSIS — C772 Secondary and unspecified malignant neoplasm of intra-abdominal lymph nodes: Secondary | ICD-10-CM | POA: Diagnosis not present

## 2023-08-20 DIAGNOSIS — C775 Secondary and unspecified malignant neoplasm of intrapelvic lymph nodes: Secondary | ICD-10-CM | POA: Diagnosis not present

## 2023-08-23 DIAGNOSIS — C775 Secondary and unspecified malignant neoplasm of intrapelvic lymph nodes: Secondary | ICD-10-CM | POA: Diagnosis not present

## 2023-08-23 DIAGNOSIS — Z85118 Personal history of other malignant neoplasm of bronchus and lung: Secondary | ICD-10-CM | POA: Diagnosis not present

## 2023-08-23 DIAGNOSIS — C786 Secondary malignant neoplasm of retroperitoneum and peritoneum: Secondary | ICD-10-CM | POA: Diagnosis not present

## 2023-08-23 DIAGNOSIS — Z191 Hormone sensitive malignancy status: Secondary | ICD-10-CM | POA: Diagnosis not present

## 2023-08-23 DIAGNOSIS — Z923 Personal history of irradiation: Secondary | ICD-10-CM | POA: Diagnosis not present

## 2023-08-23 DIAGNOSIS — C772 Secondary and unspecified malignant neoplasm of intra-abdominal lymph nodes: Secondary | ICD-10-CM | POA: Diagnosis not present

## 2023-08-23 DIAGNOSIS — Z96643 Presence of artificial hip joint, bilateral: Secondary | ICD-10-CM | POA: Diagnosis not present

## 2023-08-23 DIAGNOSIS — Z510 Encounter for antineoplastic radiation therapy: Secondary | ICD-10-CM | POA: Diagnosis not present

## 2023-08-24 DIAGNOSIS — Z510 Encounter for antineoplastic radiation therapy: Secondary | ICD-10-CM | POA: Diagnosis not present

## 2023-08-24 DIAGNOSIS — C786 Secondary malignant neoplasm of retroperitoneum and peritoneum: Secondary | ICD-10-CM | POA: Diagnosis not present

## 2023-08-24 DIAGNOSIS — Z923 Personal history of irradiation: Secondary | ICD-10-CM | POA: Diagnosis not present

## 2023-08-24 DIAGNOSIS — Z191 Hormone sensitive malignancy status: Secondary | ICD-10-CM | POA: Diagnosis not present

## 2023-08-24 DIAGNOSIS — Z96643 Presence of artificial hip joint, bilateral: Secondary | ICD-10-CM | POA: Diagnosis not present

## 2023-08-24 DIAGNOSIS — C772 Secondary and unspecified malignant neoplasm of intra-abdominal lymph nodes: Secondary | ICD-10-CM | POA: Diagnosis not present

## 2023-08-24 DIAGNOSIS — Z85118 Personal history of other malignant neoplasm of bronchus and lung: Secondary | ICD-10-CM | POA: Diagnosis not present

## 2023-08-24 DIAGNOSIS — C775 Secondary and unspecified malignant neoplasm of intrapelvic lymph nodes: Secondary | ICD-10-CM | POA: Diagnosis not present

## 2023-08-25 DIAGNOSIS — C775 Secondary and unspecified malignant neoplasm of intrapelvic lymph nodes: Secondary | ICD-10-CM | POA: Diagnosis not present

## 2023-08-25 DIAGNOSIS — Z96643 Presence of artificial hip joint, bilateral: Secondary | ICD-10-CM | POA: Diagnosis not present

## 2023-08-25 DIAGNOSIS — C786 Secondary malignant neoplasm of retroperitoneum and peritoneum: Secondary | ICD-10-CM | POA: Diagnosis not present

## 2023-08-25 DIAGNOSIS — Z85118 Personal history of other malignant neoplasm of bronchus and lung: Secondary | ICD-10-CM | POA: Diagnosis not present

## 2023-08-25 DIAGNOSIS — C772 Secondary and unspecified malignant neoplasm of intra-abdominal lymph nodes: Secondary | ICD-10-CM | POA: Diagnosis not present

## 2023-08-25 DIAGNOSIS — Z510 Encounter for antineoplastic radiation therapy: Secondary | ICD-10-CM | POA: Diagnosis not present

## 2023-08-25 DIAGNOSIS — Z191 Hormone sensitive malignancy status: Secondary | ICD-10-CM | POA: Diagnosis not present

## 2023-08-25 DIAGNOSIS — Z923 Personal history of irradiation: Secondary | ICD-10-CM | POA: Diagnosis not present

## 2023-08-26 DIAGNOSIS — Z510 Encounter for antineoplastic radiation therapy: Secondary | ICD-10-CM | POA: Diagnosis not present

## 2023-08-26 DIAGNOSIS — C775 Secondary and unspecified malignant neoplasm of intrapelvic lymph nodes: Secondary | ICD-10-CM | POA: Diagnosis not present

## 2023-08-26 DIAGNOSIS — Z96643 Presence of artificial hip joint, bilateral: Secondary | ICD-10-CM | POA: Diagnosis not present

## 2023-08-26 DIAGNOSIS — C772 Secondary and unspecified malignant neoplasm of intra-abdominal lymph nodes: Secondary | ICD-10-CM | POA: Diagnosis not present

## 2023-08-26 DIAGNOSIS — Z85118 Personal history of other malignant neoplasm of bronchus and lung: Secondary | ICD-10-CM | POA: Diagnosis not present

## 2023-08-26 DIAGNOSIS — Z923 Personal history of irradiation: Secondary | ICD-10-CM | POA: Diagnosis not present

## 2023-08-26 DIAGNOSIS — Z191 Hormone sensitive malignancy status: Secondary | ICD-10-CM | POA: Diagnosis not present

## 2023-08-26 DIAGNOSIS — C786 Secondary malignant neoplasm of retroperitoneum and peritoneum: Secondary | ICD-10-CM | POA: Diagnosis not present

## 2023-08-27 DIAGNOSIS — Z85118 Personal history of other malignant neoplasm of bronchus and lung: Secondary | ICD-10-CM | POA: Diagnosis not present

## 2023-08-27 DIAGNOSIS — Z510 Encounter for antineoplastic radiation therapy: Secondary | ICD-10-CM | POA: Diagnosis not present

## 2023-08-27 DIAGNOSIS — Z191 Hormone sensitive malignancy status: Secondary | ICD-10-CM | POA: Diagnosis not present

## 2023-08-27 DIAGNOSIS — Z96643 Presence of artificial hip joint, bilateral: Secondary | ICD-10-CM | POA: Diagnosis not present

## 2023-08-27 DIAGNOSIS — C775 Secondary and unspecified malignant neoplasm of intrapelvic lymph nodes: Secondary | ICD-10-CM | POA: Diagnosis not present

## 2023-08-27 DIAGNOSIS — C786 Secondary malignant neoplasm of retroperitoneum and peritoneum: Secondary | ICD-10-CM | POA: Diagnosis not present

## 2023-08-27 DIAGNOSIS — Z923 Personal history of irradiation: Secondary | ICD-10-CM | POA: Diagnosis not present

## 2023-08-27 DIAGNOSIS — C772 Secondary and unspecified malignant neoplasm of intra-abdominal lymph nodes: Secondary | ICD-10-CM | POA: Diagnosis not present

## 2023-08-30 DIAGNOSIS — C786 Secondary malignant neoplasm of retroperitoneum and peritoneum: Secondary | ICD-10-CM | POA: Diagnosis not present

## 2023-08-30 DIAGNOSIS — Z923 Personal history of irradiation: Secondary | ICD-10-CM | POA: Diagnosis not present

## 2023-08-30 DIAGNOSIS — Z191 Hormone sensitive malignancy status: Secondary | ICD-10-CM | POA: Diagnosis not present

## 2023-08-30 DIAGNOSIS — Z96643 Presence of artificial hip joint, bilateral: Secondary | ICD-10-CM | POA: Diagnosis not present

## 2023-08-30 DIAGNOSIS — C775 Secondary and unspecified malignant neoplasm of intrapelvic lymph nodes: Secondary | ICD-10-CM | POA: Diagnosis not present

## 2023-08-30 DIAGNOSIS — Z510 Encounter for antineoplastic radiation therapy: Secondary | ICD-10-CM | POA: Diagnosis not present

## 2023-08-30 DIAGNOSIS — C772 Secondary and unspecified malignant neoplasm of intra-abdominal lymph nodes: Secondary | ICD-10-CM | POA: Diagnosis not present

## 2023-08-30 DIAGNOSIS — Z85118 Personal history of other malignant neoplasm of bronchus and lung: Secondary | ICD-10-CM | POA: Diagnosis not present

## 2023-08-31 DIAGNOSIS — Z191 Hormone sensitive malignancy status: Secondary | ICD-10-CM | POA: Diagnosis not present

## 2023-08-31 DIAGNOSIS — C772 Secondary and unspecified malignant neoplasm of intra-abdominal lymph nodes: Secondary | ICD-10-CM | POA: Diagnosis not present

## 2023-08-31 DIAGNOSIS — Z510 Encounter for antineoplastic radiation therapy: Secondary | ICD-10-CM | POA: Diagnosis not present

## 2023-08-31 DIAGNOSIS — Z85118 Personal history of other malignant neoplasm of bronchus and lung: Secondary | ICD-10-CM | POA: Diagnosis not present

## 2023-08-31 DIAGNOSIS — C786 Secondary malignant neoplasm of retroperitoneum and peritoneum: Secondary | ICD-10-CM | POA: Diagnosis not present

## 2023-08-31 DIAGNOSIS — Z96643 Presence of artificial hip joint, bilateral: Secondary | ICD-10-CM | POA: Diagnosis not present

## 2023-08-31 DIAGNOSIS — C775 Secondary and unspecified malignant neoplasm of intrapelvic lymph nodes: Secondary | ICD-10-CM | POA: Diagnosis not present

## 2023-08-31 DIAGNOSIS — Z923 Personal history of irradiation: Secondary | ICD-10-CM | POA: Diagnosis not present

## 2023-09-27 ENCOUNTER — Other Ambulatory Visit: Payer: Self-pay | Admitting: Family Medicine

## 2023-09-27 DIAGNOSIS — E1159 Type 2 diabetes mellitus with other circulatory complications: Secondary | ICD-10-CM

## 2023-09-30 ENCOUNTER — Other Ambulatory Visit: Payer: Self-pay | Admitting: Family Medicine

## 2023-09-30 DIAGNOSIS — K219 Gastro-esophageal reflux disease without esophagitis: Secondary | ICD-10-CM

## 2023-10-14 DIAGNOSIS — C775 Secondary and unspecified malignant neoplasm of intrapelvic lymph nodes: Secondary | ICD-10-CM | POA: Diagnosis not present

## 2023-10-14 DIAGNOSIS — C786 Secondary malignant neoplasm of retroperitoneum and peritoneum: Secondary | ICD-10-CM | POA: Diagnosis not present

## 2023-10-15 ENCOUNTER — Other Ambulatory Visit: Payer: Self-pay | Admitting: Family Medicine

## 2023-10-15 DIAGNOSIS — E1159 Type 2 diabetes mellitus with other circulatory complications: Secondary | ICD-10-CM

## 2023-10-25 ENCOUNTER — Other Ambulatory Visit: Payer: Self-pay | Admitting: Family Medicine

## 2023-11-02 LAB — HM DIABETES EYE EXAM

## 2023-11-02 NOTE — Progress Notes (Unsigned)
 Cardiology Office Note:    Date:  11/03/2023   ID:  Alan Hurst, DOB 28-Aug-1945, MRN [id number]  PCP:  Wallace Joesph LABOR, PA  Cardiologist:  Lonni LITTIE Nanas, MD  Electrophysiologist:  None   Referring MD: Wallace Joesph LABOR, PA   Chief Complaint  Patient presents with   Congestive Heart Failure    History of Present Illness:    Alan Hurst is a 78 y.o. male with a hx of CAD, chronic systolic heart failure, hypertension, hyperlipidemia, COPD, T2DM, CKD stage IIIb, prostate cancer who presents for follow-up.  He was admitted 04/2022 with shortness of breath and chest pain and shortness of breath.  Troponin elevated to 500.  Echocardiogram showed EF 40 to 45%.  LHC showed nonobstructive CAD.  Troponin elevation felt to be demand ischemia in setting of COPD.  Echocardiogram 08/2022 showed EF 60 to 65%, normal RV function, no significant valvular disease.  Echocardiogram 06/03/2023 showed normal biventricular function, normal diastolic function, no significant valvular disease.  Since last clinic visit, he was admitted for SBO 07/2023.  Improved with conservative management.  Denies any chest pain, dyspnea, lightheadedness, syncope, or palpitations.  Does report some lower extremity swelling.   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   [facility]   Recovering alcoholic in remission (HCC) 12/05/2015   No alcohol in many, many years     Sleep disorder 12/05/2015   Vitamin B deficiency 12/05/2015   Vitamin D  insufficiency 12/05/2015    Past Surgical History:  Procedure Laterality Date   CATARACT EXTRACTION, BILATERAL  10/2016   COLONOSCOPY     FEMUR FRACTURE SURGERY     HIP FRACTURE SURGERY     JOINT REPLACEMENT Bilateral    hips   LEFT HEART CATH AND CORONARY ANGIOGRAPHY N/A 04/27/2022   Procedure: LEFT HEART CATH AND CORONARY ANGIOGRAPHY;  Surgeon: Verlin Lonni BIRCH, MD;  Location: [facility];  Service: Cardiovascular;  Laterality: N/A;   TIBIA FRACTURE SURGERY     TOTAL HIP REVISION Right 08/07/2016   Procedure: RIGHT TOTAL HIP REVISION;  Surgeon: Redell Shoals, MD;  Location: [facility] OR;  Service: Orthopedics;  Laterality: Right;    Current Medications: Current Meds  Medication Sig   abiraterone  acetate (ZYTIGA ) 250 MG tablet Take 1,000 mg by mouth daily.   Ascorbic Acid (VITAMIN C) 1000 MG tablet Take 1,000 mg by mouth daily.   aspirin  EC 81 MG tablet Take 81 mg by mouth daily.   azithromycin  (ZITHROMAX ) 250 MG tablet Take 250 mg by mouth every other day.   beclomethasone (QVAR  REDIHALER)  40 MCG/ACT inhaler Inhale 1 puff into the lungs 2 (two) times daily.   Cholecalciferol  (VITAMIN D ) 2000 units tablet Take 2,000 Units by mouth daily.   Krill Oil (OMEGA-3) 500 MG CAPS Take 1 capsule by mouth daily.   levalbuterol  (XOPENEX ) 0.63 MG/3ML nebulizer solution USE 1 VIAL VIA NEBULIZER EVERY 8 HOURS AS NEEDED FOR WHEEZING OR  SHORTNESS OF BREATH   losartan  (COZAAR ) 25 MG tablet Take 1 tablet (25 mg total) by mouth daily.   lovastatin  (MEVACOR ) 40 MG tablet TAKE 1 TABLET BY MOUTH AT  BEDTIME   magnesium  oxide (MAG-OX) 400 MG tablet Take 400 mg by mouth daily.   Melatonin 10 MG  TABS Take 10 mg by mouth at bedtime.   metFORMIN  (GLUCOPHAGE ) 500 MG tablet TAKE 1 TABLET BY MOUTH DAILY   metoprolol  succinate (TOPROL -XL) 25 MG 24 hr tablet TAKE 1 TABLET BY MOUTH DAILY   omeprazole  (PRILOSEC) 20 MG capsule TAKE 1 CAPSULE BY MOUTH DAILY   predniSONE  (DELTASONE ) 5 MG tablet Take 1 tablet by mouth daily.   Tiotropium Bromide-Olodaterol (STIOLTO RESPIMAT ) 2.5-2.5 MCG/ACT AERS Inhale 1 puff into the lungs daily at 12 noon.   vitamin B-12 (CYANOCOBALAMIN ) 500 MCG tablet Take 500 mcg by mouth daily.     Allergies:   Patient has no known allergies.   Social History   Socioeconomic History   Marital status: Married    Spouse name: Clarita   Number of children: 0   Years of education: Not on file   Highest education level: Not on file  Occupational History   Occupation: semi retired  Tobacco Use   Smoking status: Former    Current packs/day: 0.00    Average packs/day: 1.5 packs/day for 32.0 years (48.0 ttl pk-yrs)    Types: Cigarettes    Start date: 05/11/1961    Quit date: 05/11/1993    Years since quitting: 30.5    Passive exposure: Never   Smokeless tobacco: Never  Vaping Use   Vaping status: Never Used  Substance and Sexual Activity   Alcohol use: Not Currently   Drug use: No   Sexual activity: Not Currently    Birth control/protection: None  Other Topics Concern   Not on file  Social History Narrative   Not on file   Social Drivers of Health   Financial Resource Strain: Low Risk  (06/17/2023)   Overall Financial Resource Strain (CARDIA)    Difficulty of Paying Living Expenses: Not hard at all  Food Insecurity: No Food Insecurity (08/03/2023)   Hunger Vital Sign    Worried About Running Out of Food in the Last Year: Never true    Ran Out of Food in the Last Year: Never true  Transportation Needs: No Transportation Needs (08/03/2023)   PRAPARE - Administrator, Civil Service (Medical): No    Lack of Transportation (Non-Medical): No  Physical  Activity: Inactive (06/17/2023)   Exercise Vital Sign    Days of Exercise per Week: 0 days    Minutes of Exercise per Session: 0 min  Stress: No Stress Concern Present (06/17/2023)   [employer] of Occupational Health - Occupational Stress Questionnaire    Feeling of Stress : Only a little  Social Connections: Socially Integrated (07/31/2023)   Social Connection and Isolation Panel    Frequency of Communication with Friends and Family: More than three times a week    Frequency of Social Gatherings with Friends and Family: More than three times a week  Attends Religious Services: More than 4 times per year    Active Member of Clubs or Organizations: Yes    Attends Banker Meetings: More than 4 times per year    Marital Status: Married     Family History: The patient's family history includes Alcohol abuse in his father and mother; Congestive Heart Failure in his father; Kidney disease in his father. There is no history of Colon cancer, Esophageal cancer, Rectal cancer, or Stomach cancer.  ROS:   Please see the history of present illness.     All other systems reviewed and are negative.  EKGs/Labs/Other Studies Reviewed:    The following studies were reviewed today:   EKG:   02/11/23: Normal sinus rhythm, rate 65, no ST abnormalities  Recent Labs: 08/12/2023: ALT 18; BUN 11; Creatinine, Ser 1.21; Hemoglobin 10.5; Magnesium  2.1; Platelets 371; Potassium 3.9; Sodium 140  Recent Lipid Panel    Component Value Date/Time   CHOL 158 04/30/2023 0908   TRIG 164 (H) 04/30/2023 0908   HDL 67 04/30/2023 0908   CHOLHDL 2.4 04/30/2023 0908   CHOLHDL 2.3 04/25/2022 0500   VLDL 14 04/25/2022 0500   LDLCALC 64 04/30/2023 0908    Physical Exam:    VS:  BP 112/60   Pulse 68   Ht 5' 9 (1.753 m)   Wt 180 lb 9.6 oz (81.9 kg)   SpO2 97%   BMI 26.67 kg/m     Wt Readings from Last 3 Encounters:  11/03/23 180 lb 9.6 oz (81.9 kg)  08/12/23 185 lb 1.9 oz (84 kg)  07/30/23  180 lb (81.6 kg)     GEN:  Well nourished, well developed in no acute distress HEENT: Normal NECK: No JVD; No carotid bruits CARDIAC: RRR, no murmurs, rubs, gallops RESPIRATORY:  Clear to auscultation without rales, wheezing or rhonchi  ABDOMEN: Soft, non-tender, non-distended MUSCULOSKELETAL: Trace edema; No deformity  SKIN: Warm and dry NEUROLOGIC:  Alert and oriented x 3 PSYCHIATRIC:  Normal affect   ASSESSMENT:    1. Heart failure with recovered ejection fraction (HFrecEF) (HCC)   2. Essential hypertension   3. Coronary artery disease involving native coronary artery of native heart without angina pectoris   4. Hyperlipidemia, unspecified hyperlipidemia type   5. CKD stage 3a, GFR 45-59 ml/min (HCC)       PLAN:    Heart failure with recovered EF: Echocardiogram 04/2022 during admission for COPD exacerbation showed EF 40 to 45%.  Repeat echo 08/2022 showed EF had improved to 60 to 65%.  Echocardiogram 06/03/2023 showed normal biventricular function, normal diastolic function, no significant valvular disease. -Worsening renal function 02/2023, his diuretics (Farxiga , spironolactone ) were discontinued.  Entresto  was changed to losartan  25 mg daily.  Continue Toprol -XL 25 mg daily - Continue Lasix  40 mg daily as needed.  Asked to monitor daily weights.  Can take Lasix  if gains more than 3 pounds in 1 day or 5 pounds in 1 week, or worsening edema  CAD: LHC 04/2022 showed nonobstructive CAD.  Continue ASA, statin  Hypertension: Continue losartan , Toprol -XL as above.  Appears controlled.  Hyperlipidemia: On lovastatin  40 mg daily.  LDL 64 on 04/30/23  CKD stage IIIa: Creatinine 1.47 on 10/14/2023.  Check BMET   RTC in 6 months    Medication Adjustments/Labs and Tests Ordered: Current medicines are reviewed at length with the patient today.  Concerns regarding medicines are outlined above.  Orders Placed This Encounter  Procedures   Basic Metabolic Panel (BMET)  Magnesium     No orders of the defined types were placed in this encounter.   Patient Instructions  Medication Instructions:  Continue current medicaitrons *If you need a refill on your cardiac medications before your next appointment, please call your pharmacy*  Lab Work: Bmet, mg today If you have labs (blood work) drawn today and your tests are completely normal, you will receive your results only by: MyChart Message (if you have MyChart) OR A paper copy in the mail If you have any lab test that is abnormal or we need to change your treatment, we will call you to review the results.  Testing/Procedures: none  Follow-Up: At [facility], you and your health needs are our priority.  As part of our continuing mission to provide you with exceptional heart care, our providers are all part of one team.  This team includes your primary Cardiologist (physician) and Advanced Practice Providers or APPs (Physician Assistants and Nurse Practitioners) who all work together to provide you with the care you need, when you need it.  Your next appointment:   6 month(s)  Provider:   Lonni LITTIE Nanas, MD    We recommend signing up for the patient portal called MyChart.  Sign up information is provided on this After Visit Summary.  MyChart is used to connect with patients for Virtual Visits (Telemedicine).  Patients are able to view lab/test results, encounter notes, upcoming appointments, etc.  Non-urgent messages can be sent to your provider as well.   To learn more about what you can do with MyChart, go to [email or web address].   Other Instructions none       Signed, Lonni LITTIE Nanas, MD  11/03/2023 11:28 AM    [facility]

## 2023-11-03 ENCOUNTER — Encounter: Payer: Self-pay | Admitting: Cardiology

## 2023-11-03 ENCOUNTER — Ambulatory Visit: Payer: Medicare Other | Attending: Cardiology | Admitting: Cardiology

## 2023-11-03 VITALS — BP 112/60 | HR 68 | Ht 69.0 in | Wt 180.6 lb

## 2023-11-03 DIAGNOSIS — E785 Hyperlipidemia, unspecified: Secondary | ICD-10-CM

## 2023-11-03 DIAGNOSIS — I2510 Atherosclerotic heart disease of native coronary artery without angina pectoris: Secondary | ICD-10-CM

## 2023-11-03 DIAGNOSIS — N1831 Chronic kidney disease, stage 3a: Secondary | ICD-10-CM | POA: Diagnosis not present

## 2023-11-03 DIAGNOSIS — I10 Essential (primary) hypertension: Secondary | ICD-10-CM | POA: Diagnosis not present

## 2023-11-03 DIAGNOSIS — I5032 Chronic diastolic (congestive) heart failure: Secondary | ICD-10-CM

## 2023-11-03 NOTE — Patient Instructions (Signed)
 Medication Instructions:  Continue current medicaitrons *If you need a refill on your cardiac medications before your next appointment, please call your pharmacy*  Lab Work: Bmet, mg today If you have labs (blood work) drawn today and your tests are completely normal, you will receive your results only by: MyChart Message (if you have MyChart) OR A paper copy in the mail If you have any lab test that is abnormal or we need to change your treatment, we will call you to review the results.  Testing/Procedures: none  Follow-Up: At [facility], you and your health needs are our priority.  As part of our continuing mission to provide you with exceptional heart care, our providers are all part of one team.  This team includes your primary Cardiologist (physician) and Advanced Practice Providers or APPs (Physician Assistants and Nurse Practitioners) who all work together to provide you with the care you need, when you need it.  Your next appointment:   6 month(s)  Provider:   Lonni LITTIE Nanas, MD    We recommend signing up for the patient portal called MyChart.  Sign up information is provided on this After Visit Summary.  MyChart is used to connect with patients for Virtual Visits (Telemedicine).  Patients are able to view lab/test results, encounter notes, upcoming appointments, etc.  Non-urgent messages can be sent to your provider as well.   To learn more about what you can do with MyChart, go to [email or web address].   Other Instructions none

## 2023-11-04 ENCOUNTER — Ambulatory Visit: Payer: Self-pay | Admitting: Cardiology

## 2023-11-04 LAB — BASIC METABOLIC PANEL WITH GFR
BUN/Creatinine Ratio: 18 (ref 10–24)
BUN: 25 mg/dL (ref 8–27)
CO2: 18 mmol/L — ABNORMAL LOW (ref 20–29)
Calcium: 9.7 mg/dL (ref 8.6–10.2)
Chloride: 104 mmol/L (ref 96–106)
Creatinine, Ser: 1.38 mg/dL — ABNORMAL HIGH (ref 0.76–1.27)
Glucose: 85 mg/dL (ref 70–99)
Potassium: 4.8 mmol/L (ref 3.5–5.2)
Sodium: 141 mmol/L (ref 134–144)
eGFR: 53 mL/min/{1.73_m2} — ABNORMAL LOW (ref >59)

## 2023-11-04 LAB — MAGNESIUM: Magnesium: 2.4 mg/dL — ABNORMAL HIGH (ref 1.6–2.3)

## 2023-11-25 ENCOUNTER — Telehealth: Payer: Self-pay

## 2023-11-25 NOTE — Telephone Encounter (Signed)
 Copied from CRM [id number]. Topic: Clinical - Prescription Issue >> Nov 25, 2023  1:57 PM Emylou G wrote: Reason for CRM: Pls call patient.. checking status of nebulizer.. he rcvd the liquid but not the nebulizer?

## 2023-11-26 ENCOUNTER — Other Ambulatory Visit: Payer: Self-pay | Admitting: Family Medicine

## 2023-11-26 DIAGNOSIS — J441 Chronic obstructive pulmonary disease with (acute) exacerbation: Secondary | ICD-10-CM

## 2023-11-26 NOTE — Telephone Encounter (Signed)
 Nebulizer dme order sent in.

## 2023-11-29 NOTE — Telephone Encounter (Signed)
 Message sent to the DME team and they are working on this.

## 2023-12-13 ENCOUNTER — Ambulatory Visit: Admitting: Family Medicine

## 2024-01-23 ENCOUNTER — Other Ambulatory Visit: Payer: Self-pay | Admitting: Family Medicine

## 2024-01-23 DIAGNOSIS — E119 Type 2 diabetes mellitus without complications: Secondary | ICD-10-CM

## 2024-01-23 DIAGNOSIS — E782 Mixed hyperlipidemia: Secondary | ICD-10-CM

## 2024-01-31 ENCOUNTER — Ambulatory Visit (INDEPENDENT_AMBULATORY_CARE_PROVIDER_SITE_OTHER): Admitting: Family Medicine

## 2024-01-31 ENCOUNTER — Encounter: Payer: Self-pay | Admitting: Family Medicine

## 2024-01-31 VITALS — BP 128/77 | HR 68 | Ht 69.0 in | Wt 182.0 lb

## 2024-01-31 DIAGNOSIS — C61 Malignant neoplasm of prostate: Secondary | ICD-10-CM

## 2024-01-31 DIAGNOSIS — I152 Hypertension secondary to endocrine disorders: Secondary | ICD-10-CM

## 2024-01-31 DIAGNOSIS — Z23 Encounter for immunization: Secondary | ICD-10-CM

## 2024-01-31 DIAGNOSIS — E1159 Type 2 diabetes mellitus with other circulatory complications: Secondary | ICD-10-CM | POA: Diagnosis not present

## 2024-01-31 DIAGNOSIS — J449 Chronic obstructive pulmonary disease, unspecified: Secondary | ICD-10-CM

## 2024-01-31 DIAGNOSIS — E1169 Type 2 diabetes mellitus with other specified complication: Secondary | ICD-10-CM

## 2024-01-31 DIAGNOSIS — Z7984 Long term (current) use of oral hypoglycemic drugs: Secondary | ICD-10-CM

## 2024-01-31 DIAGNOSIS — E782 Mixed hyperlipidemia: Secondary | ICD-10-CM

## 2024-01-31 DIAGNOSIS — L299 Pruritus, unspecified: Secondary | ICD-10-CM

## 2024-01-31 DIAGNOSIS — C3411 Malignant neoplasm of upper lobe, right bronchus or lung: Secondary | ICD-10-CM

## 2024-01-31 DIAGNOSIS — N1831 Chronic kidney disease, stage 3a: Secondary | ICD-10-CM | POA: Diagnosis not present

## 2024-01-31 DIAGNOSIS — F4321 Adjustment disorder with depressed mood: Secondary | ICD-10-CM

## 2024-01-31 MED ORDER — TRIAMCINOLONE ACETONIDE 0.1 % EX CREA: 1.0000 | TOPICAL_CREAM | Freq: Two times a day (BID) | CUTANEOUS | 0 refills | Status: AC | PRN

## 2024-01-31 NOTE — Patient Instructions (Signed)
 It was nice to see you today,  We addressed the following topics today: -I will send in referral to dermatology. - I will let you know the results of the labs I get them - For your inhalers, I would recommend asking your insurance company the price is for the following: Incruse Ellipta , Stiolto Respimat , Breztri, Trelegy.  When you decide which one you would like to send in through [employer] I will let you know and I will send in.  Have a great day,  Rolan Slain, MD

## 2024-01-31 NOTE — Progress Notes (Unsigned)
   Established Patient Office Visit  Subjective   Patient ID: Alan Hurst, male    DOB: 1945-08-26  Age: 78 y.o. MRN: [id number]  No chief complaint on file.   HPI  Subjective - Reports intermittent, spreading pruritus, primarily on the back and arms. No rash or scaling. Attributes it to stress. Using previously prescribed triamcinolone  cream at night with some relief. - Discussed significant stress and grief following the death of his wife two months ago. She had a 30-year history of MS. He is living alone but has support from two brothers in town. Attending AA meetings for support and has an appointment with a hospice bereavement counselor next Thursday. - Inquires about inhaler costs and options. - Reports some congestion and cough.  Medications: Levalbuterol , Qvar , and Spiriva Respimat prescribed by pulmonology. Currently using samples of Spiriva. Takes furosemide  once weekly for edema prevention, as advised by his cardiologist.  PMH, PSH, FH, Social Hx: PMHx: Eczema, dermatitis, diabetes, history of elevated creatinine. Wife deceased two months ago from complications of MS. FH: Not discussed. Social Hx: Attends AA meetings for support. Has two brothers in town. States recent PET scan was normal.  ROS: Skin: Reports pruritus and age spots. Denies rash or scaling. Respiratory: Reports some congestion and cough. Constitutional: Denies weight gain or loss. Psychological: Reports sadness, depression, and stress related to grieving.  Objective PULM: Lungs clear to auscultation bilaterally.  Assessment and Plan Grief Reaction: Experiencing significant stress, sadness, and depression following the recent death of his wife. He is actively seeking support. - Continue attending AA meetings. - Follow up with hospice bereavement counselor as planned. - Encouraged to contact the office if symptoms change or worsen.  Pruritus/Dermatitis: Reports intermittent, spreading itching consistent with  a history of eczema and dermatitis. Responds to triamcinolone . - Re-prescribe triamcinolone  cream. - Submit referral to Dermatology (Dr. Vicki).  Asthma/COPD: Inquires about more affordable long-acting inhaler options through [employer]. Currently uses samples from pulmonology. - Provided names of potential alternatives: Incruse, Trelegy, or Breztri. - Advised to call his insurance/[employer] [facility] to check pricing for each option. - Plan to send a prescription for the selected inhaler once he informs us  of his choice. - Continue levalbuterol  and Qvar  as prescribed.  Diabetes Mellitus: Due for routine monitoring and foot exam. - Performed annual diabetic foot exam. - Ordered labs today to check A1C and repeat creatinine.  Flu Vaccine: - Administered flu vaccine today.   The ASCVD Risk score (Arnett DK, et al., 2019) failed to calculate for the following reasons:   Risk score cannot be calculated because patient has a medical history suggesting prior/existing ASCVD  Health Maintenance Due  Topic Date Due   Zoster Vaccines- Shingrix (1 of 2) Never done   Colonoscopy  06/19/2022   FOOT EXAM  11/05/2023   Influenza Vaccine  12/10/2023   COVID-19 Vaccine (4 - 2025-26 season) 01/10/2024      Objective:     There were no vitals taken for this visit. {Vitals History (Optional):23777}  Physical Exam   No results found for any visits on 01/31/24.      Assessment & Plan:   There are no diagnoses linked to this encounter.   No follow-ups on file.    Toribio MARLA Slain, MD

## 2024-02-01 ENCOUNTER — Other Ambulatory Visit: Payer: Self-pay | Admitting: Family Medicine

## 2024-02-01 ENCOUNTER — Ambulatory Visit: Payer: Self-pay | Admitting: Family Medicine

## 2024-02-01 LAB — BASIC METABOLIC PANEL WITH GFR
BUN/Creatinine Ratio: 14 (ref 10–24)
BUN: 22 mg/dL (ref 8–27)
CO2: 22 mmol/L (ref 20–29)
Calcium: 9.8 mg/dL (ref 8.6–10.2)
Chloride: 102 mmol/L (ref 96–106)
Creatinine, Ser: 1.56 mg/dL — ABNORMAL HIGH (ref 0.76–1.27)
Glucose: 89 mg/dL (ref 70–99)
Potassium: 5 mmol/L (ref 3.5–5.2)
Sodium: 139 mmol/L (ref 134–144)
eGFR: 45 mL/min/1.73 — ABNORMAL LOW (ref >59)

## 2024-02-01 LAB — HEMOGLOBIN A1C
Est. average glucose Bld gHb Est-mCnc: 134 mg/dL
Hgb A1c MFr Bld: 6.3 % — ABNORMAL HIGH (ref 4.8–5.6)

## 2024-02-02 NOTE — Progress Notes (Signed)
 Called patient he is advised of lab results

## 2024-02-03 DIAGNOSIS — F4321 Adjustment disorder with depressed mood: Secondary | ICD-10-CM | POA: Insufficient documentation

## 2024-02-03 DIAGNOSIS — L299 Pruritus, unspecified: Secondary | ICD-10-CM | POA: Insufficient documentation

## 2024-02-03 NOTE — Assessment & Plan Note (Signed)
 Sees oncology with [facility].  Currently on androgen deprivation therapy w/ lupron, zytiga , prednisone . Recent pet scan in August shows:  1. Mild asymmetric focus of increased uptake within the left side of the  prostate gland with an SUV max of 6.2, previously 7.5. this may represent  prostate cancer or local recurrence.  2. No evidence of nodal metastases or distant metastatic disease.  3. Unchanged appearance of tracer avid chronic band-like area of consolidation  at lung cancer treatment site in the right upper lobe.

## 2024-02-03 NOTE — Assessment & Plan Note (Signed)
 Due for routine monitoring and foot exam. - Performed annual diabetic foot exam. - Ordered labs today to check A1C and repeat creatinine.

## 2024-02-03 NOTE — Assessment & Plan Note (Signed)
 Inquires about more affordable long-acting inhaler options through [employer]. Currently uses samples from pulmonology. - Provided names of potential alternatives: Incruse, Trelegy, or Breztri. - Advised to call his insurance/[employer] [facility] to check pricing for each option. - Plan to send a prescription for the selected inhaler once he informs us  of his choice. - Continue levalbuterol  and Qvar  as prescribed.

## 2024-02-03 NOTE — Assessment & Plan Note (Signed)
 Reports intermittent, spreading itching consistent with a history of eczema and dermatitis. Responds to triamcinolone . - Re-prescribe triamcinolone  cream. - Submit referral to Dermatology

## 2024-02-03 NOTE — Assessment & Plan Note (Signed)
 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  Continues to f/u with pulm.  S/p radiation therapy.

## 2024-02-03 NOTE — Assessment & Plan Note (Signed)
 Experiencing significant stress, sadness, and depression following the recent death of his wife. He is actively seeking support. - Continue attending AA meetings. - Follow up with hospice bereavement counselor as planned. - Encouraged to contact the office if symptoms change or worsen.

## 2024-02-08 ENCOUNTER — Encounter (HOSPITAL_BASED_OUTPATIENT_CLINIC_OR_DEPARTMENT_OTHER): Payer: Self-pay

## 2024-04-24 ENCOUNTER — Other Ambulatory Visit: Payer: Self-pay | Admitting: Cardiology

## 2024-05-07 NOTE — Progress Notes (Unsigned)
 " Cardiology Office Note:    Date:  05/09/2024   ID:  Alan Hurst, DOB 10/01/1945, MRN [id number]  PCP:  Alan Toribio POUR, MD  Cardiologist:  Alan LITTIE Nanas, MD  Electrophysiologist:  None   Referring MD: Alan Joesph LABOR, PA   Chief Complaint  Patient presents with   Congestive Heart Failure    History of Present Illness:    Alan Hurst is a 78 y.o. male with a hx of CAD, chronic systolic heart failure, hypertension, hyperlipidemia, COPD, T2DM, CKD stage IIIb, prostate cancer who presents for follow-up.  He was admitted 04/2022 with shortness of breath and chest pain and shortness of breath.  Troponin elevated to 500.  Echocardiogram showed EF 40 to 45%.  LHC showed nonobstructive CAD.  Troponin elevation felt to be demand ischemia in setting of COPD.  Echocardiogram 08/2022 showed EF 60 to 65%, normal RV function, no significant valvular disease.  Echocardiogram 06/03/2023 showed normal biventricular function, normal diastolic function, no significant valvular disease.  Since last clinic visit, he reports he is doing okay. Denies any chest pain, dyspnea,  or palpitations.  Reports some lightheadedness if stands quickly, but denies any syncope.  Reports some swelling in legs.  He takes Lasix  about once per week.   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   [facility]   Recovering alcoholic in remission (HCC) 12/05/2015   No alcohol in many, many years     Sleep disorder 12/05/2015   Vitamin B deficiency 12/05/2015   Vitamin D  insufficiency 12/05/2015    Past Surgical History:  Procedure Laterality Date   CATARACT EXTRACTION, BILATERAL  10/2016   COLONOSCOPY     FEMUR FRACTURE SURGERY     HIP FRACTURE SURGERY     JOINT REPLACEMENT Bilateral    hips   LEFT HEART CATH AND CORONARY ANGIOGRAPHY N/A 04/27/2022   Procedure: LEFT HEART CATH AND CORONARY ANGIOGRAPHY;  Surgeon: Alan Alan BIRCH, MD;  Location: [facility];  Service: Cardiovascular;  Laterality: N/A;   TIBIA FRACTURE SURGERY     TOTAL HIP REVISION Right 08/07/2016   Procedure: RIGHT TOTAL HIP REVISION;  Surgeon: Alan Shoals, MD;  Location: [facility] OR;  Service: Orthopedics;  Laterality: Right;    Current Medications: Current Meds  Medication Sig   abiraterone  acetate (ZYTIGA ) 250 MG tablet Take 1,000 mg by mouth daily.   Ascorbic Acid (VITAMIN C) 1000 MG tablet Take 1,000 mg by mouth daily.   aspirin  EC 81 MG tablet Take 81 mg by mouth daily.   azithromycin  (ZITHROMAX ) 250 MG tablet Take 250  mg by mouth every other day.   BREZTRI AEROSPHERE 160-9-4.8 MCG/ACT AERO inhaler Inhale 2 puffs into the lungs daily.   Cholecalciferol  (VITAMIN D ) 2000 units tablet Take 2,000 Units by mouth daily.   furosemide  (LASIX ) 40 MG tablet Take 1 tablet (40 mg total) by mouth as needed. Please take daily as needed for greater than 3 lbs a day or 5 lbs in a week   Krill Oil (OMEGA-3) 500 MG CAPS Take 1 capsule by mouth daily.   levalbuterol  (XOPENEX  HFA) 45 MCG/ACT inhaler USE 2 INHALATIONS BY MOUTH EVERY 4 HOURS AS NEEDED FOR WHEEZING  OR SHORTNESS OF BREATH   levalbuterol  (XOPENEX ) 0.63 MG/3ML nebulizer solution USE 1 VIAL VIA NEBULIZER  EVERY 8 HOURS AS NEEDED FOR WHEEZING OR  SHORTNESS OF BREATH   losartan  (COZAAR ) 25 MG tablet TAKE 1 TABLET BY MOUTH DAILY   lovastatin  (MEVACOR ) 40 MG tablet TAKE 1 TABLET BY MOUTH AT  BEDTIME   magnesium  oxide (MAG-OX) 400 MG tablet Take 400 mg by mouth daily.   Melatonin 10 MG TABS Take 10 mg by mouth at bedtime.   metFORMIN  (GLUCOPHAGE ) 500 MG tablet TAKE 1 TABLET BY MOUTH DAILY   metoprolol  succinate (TOPROL -XL) 25 MG 24 hr tablet TAKE 1 TABLET BY MOUTH DAILY   omeprazole  (PRILOSEC) 20 MG capsule TAKE 1 CAPSULE BY MOUTH DAILY   predniSONE  (DELTASONE ) 5 MG tablet Take 1 tablet by mouth daily.   triamcinolone  cream (KENALOG ) 0.1 % Apply 1 Application topically 2 (two) times daily as needed.   vitamin B-12 (CYANOCOBALAMIN ) 500 MCG tablet Take 500 mcg by mouth daily.     Allergies:   Patient has no known allergies.   Social History   Socioeconomic History   Marital status: Married    Spouse name: Clarita   Number of children: 0   Years of education: Not on file   Highest education level: Not on file  Occupational History   Occupation: semi retired  Tobacco Use   Smoking status: Former    Current packs/day: 0.00    Average packs/day: 1.5 packs/day for 32.0 years (48.0 ttl pk-yrs)    Types: Cigarettes    Start date: 05/11/1961    Quit date: 05/11/1993    Years since quitting: 31.0    Passive exposure: Never   Smokeless tobacco: Never  Vaping Use   Vaping status: Never Used  Substance and Sexual Activity   Alcohol use: Not Currently   Drug use: No   Sexual activity: Not Currently    Birth control/protection: None  Other Topics Concern   Not on file  Social History Narrative   Not on file   Social Drivers of Health   Tobacco Use: Medium Risk (05/09/2024)   Patient History    Smoking Tobacco Use: Former    Smokeless Tobacco Use: Never    Passive Exposure: Never  Physicist, Medical Strain: Low Risk (06/17/2023)   Overall Financial Resource Strain (CARDIA)    Difficulty of  Paying Living Expenses: Not hard at all  Food Insecurity: No Food Insecurity (08/03/2023)   Hunger Vital Sign    Worried About Running Out of Food in the Last Year: Never true    Ran Out of Food in the Last Year: Never true  Transportation Needs: No Transportation Needs (08/03/2023)   PRAPARE - Administrator, Civil Service (Medical): No    Lack of Transportation (Non-Medical): No  Physical Activity: Inactive (06/17/2023)   Exercise Vital Sign    Days  of Exercise per Week: 0 days    Minutes of Exercise per Session: 0 min  Stress: No Stress Concern Present (06/17/2023)   [employer] of Occupational Health - Occupational Stress Questionnaire    Feeling of Stress : Only a little  Social Connections: Socially Integrated (07/31/2023)   Social Connection and Isolation Panel    Frequency of Communication with Friends and Family: More than three times a week    Frequency of Social Gatherings with Friends and Family: More than three times a week    Attends Religious Services: More than 4 times per year    Active Member of Clubs or Organizations: Yes    Attends Banker Meetings: More than 4 times per year    Marital Status: Married  Depression (PHQ2-9): Low Risk (01/31/2024)   Depression (PHQ2-9)    PHQ-2 Score: 2  Alcohol Screen: Low Risk (06/17/2023)   Alcohol Screen    Last Alcohol Screening Score (AUDIT): 0  Housing: Low Risk (08/03/2023)   Housing Stability Vital Sign    Unable to Pay for Housing in the Last Year: No    Number of Times Moved in the Last Year: 0    Homeless in the Last Year: No  Utilities: Not At Risk (08/03/2023)   AHC Utilities    Threatened with loss of utilities: No  Health Literacy: Adequate Health Literacy (06/17/2023)   B1300 Health Literacy    Frequency of need for help with medical instructions: Never     Family History: The patient's family history includes Alcohol abuse in his father and mother; Congestive Heart Failure in his father;  Kidney disease in his father. There is no history of Colon cancer, Esophageal cancer, Rectal cancer, or Stomach cancer.  ROS:   Please see the history of present illness.     All other systems reviewed and are negative.  EKGs/Labs/Other Studies Reviewed:    The following studies were reviewed today:   EKG:   02/11/23: Normal sinus rhythm, rate 65, no ST abnormalities 05/09/2024: Sinus rhythm, rate 61, first-degree AV block, no ST abnormalities  Recent Labs: 08/12/2023: ALT 18; Hemoglobin 10.5; Platelets 371 11/03/2023: Magnesium  2.4 01/31/2024: BUN 22; Creatinine, Ser 1.56; Potassium 5.0; Sodium 139  Recent Lipid Panel    Component Value Date/Time   CHOL 158 04/30/2023 0908   TRIG 164 (H) 04/30/2023 0908   HDL 67 04/30/2023 0908   CHOLHDL 2.4 04/30/2023 0908   CHOLHDL 2.3 04/25/2022 0500   VLDL 14 04/25/2022 0500   LDLCALC 64 04/30/2023 0908    Physical Exam:    VS:  BP 110/68 (BP Location: Right Arm, Patient Position: Sitting, Cuff Size: Normal)   Pulse 61   Ht 5' 9 (1.753 m)   Wt 188 lb 3.2 oz (85.4 kg)   SpO2 92%   BMI 27.79 kg/m     Wt Readings from Last 3 Encounters:  05/09/24 188 lb 3.2 oz (85.4 kg)  01/31/24 182 lb (82.6 kg)  11/03/23 180 lb 9.6 oz (81.9 kg)     GEN:  Well nourished, well developed in no acute distress HEENT: Normal NECK: No JVD; No carotid bruits CARDIAC: RRR, no murmurs, rubs, gallops RESPIRATORY:  Clear to auscultation without rales, wheezing or rhonchi  ABDOMEN: Soft, non-tender, non-distended MUSCULOSKELETAL: Trace edema; No deformity  SKIN: Warm and dry NEUROLOGIC:  Alert and oriented x 3 PSYCHIATRIC:  Normal affect   ASSESSMENT:    1. Heart failure with recovered ejection fraction (HFrecEF) (HCC)   2.  Coronary artery disease involving native coronary artery of native heart without angina pectoris   3. Essential hypertension   4. CKD stage 3a, GFR 45-59 ml/min (HCC)   5. Hyperlipidemia, unspecified hyperlipidemia type   6.  Medication management      PLAN:    Heart failure with recovered EF: Echocardiogram 04/2022 during admission for COPD exacerbation showed EF 40 to 45%.  Repeat echo 08/2022 showed EF had improved to 60 to 65%.  Echocardiogram 06/03/2023 showed normal biventricular function, normal diastolic function, no significant valvular disease. -Worsening renal function 02/2023, his diuretics (Farxiga , spironolactone ) were discontinued.  Entresto  was changed to losartan  25 mg daily.  Continue Toprol -XL 25 mg daily - Continue Lasix  40 mg daily as needed.  Asked to monitor daily weights.  Can take Lasix  if gains more than 3 pounds in 1 day or 5 pounds in 1 week, or worsening edema  CAD: LHC 04/2022 showed nonobstructive CAD.  Continue ASA, statin  Hypertension: Continue losartan , Toprol -XL as above.  Appears controlled.  Hyperlipidemia: On lovastatin  40 mg daily.  LDL 64 on 04/30/23.  Check lipid panel  CKD stage IIIa: Creatinine 1.40 on 04/18/2024   RTC in 6 months    Medication Adjustments/Labs and Tests Ordered: Current medicines are reviewed at length with the patient today.  Concerns regarding medicines are outlined above.  Orders Placed This Encounter  Procedures   Lipid Profile   EKG 12-Lead   No orders of the defined types were placed in this encounter.   Patient Instructions  Medication Instructions:  None *If you need a refill on your cardiac medications before your next appointment, please call your pharmacy*  Lab Work: At your convenience, please come back around 1 week to get a FASTED LIPID PANEL drawn.  If you have labs (blood work) drawn today and your tests are completely normal, you will receive your results only by: MyChart Message (if you have MyChart) OR A paper copy in the mail If you have any lab test that is abnormal or we need to change your treatment, we will call you to review the results.  Follow-Up: At [facility], you and your health needs are our  priority.  As part of our continuing mission to provide you with exceptional heart care, our providers are all part of one team.  This team includes your primary Cardiologist (physician) and Advanced Practice Providers or APPs (Physician Assistants and Nurse Practitioners) who all work together to provide you with the care you need, when you need it.  Your next appointment:   6 month(s)  Provider:   Lonni LITTIE Nanas, MD   We recommend signing up for the patient portal called MyChart.  Sign up information is provided on this After Visit Summary.  MyChart is used to connect with patients for Virtual Visits (Telemedicine).  Patients are able to view lab/test results, encounter notes, upcoming appointments, etc.  Non-urgent messages can be sent to your provider as well.   To learn more about what you can do with MyChart, go to [email or web address].             Signed, Alan LITTIE Nanas, MD  05/09/2024 12:03 PM    [facility] "

## 2024-05-09 ENCOUNTER — Encounter: Payer: Self-pay | Admitting: Cardiology

## 2024-05-09 ENCOUNTER — Ambulatory Visit: Admitting: Cardiology

## 2024-05-09 VITALS — BP 110/68 | HR 61 | Ht 69.0 in | Wt 188.2 lb

## 2024-05-09 DIAGNOSIS — Z79899 Other long term (current) drug therapy: Secondary | ICD-10-CM

## 2024-05-09 DIAGNOSIS — E785 Hyperlipidemia, unspecified: Secondary | ICD-10-CM | POA: Diagnosis not present

## 2024-05-09 DIAGNOSIS — N1831 Chronic kidney disease, stage 3a: Secondary | ICD-10-CM | POA: Diagnosis not present

## 2024-05-09 DIAGNOSIS — I2510 Atherosclerotic heart disease of native coronary artery without angina pectoris: Secondary | ICD-10-CM | POA: Diagnosis not present

## 2024-05-09 DIAGNOSIS — I5020 Unspecified systolic (congestive) heart failure: Secondary | ICD-10-CM

## 2024-05-09 DIAGNOSIS — I10 Essential (primary) hypertension: Secondary | ICD-10-CM

## 2024-05-09 NOTE — Patient Instructions (Signed)
 Medication Instructions:  None *If you need a refill on your cardiac medications before your next appointment, please call your pharmacy*  Lab Work: At your convenience, please come back around 1 week to get a FASTED LIPID PANEL drawn.  If you have labs (blood work) drawn today and your tests are completely normal, you will receive your results only by: MyChart Message (if you have MyChart) OR A paper copy in the mail If you have any lab test that is abnormal or we need to change your treatment, we will call you to review the results.  Follow-Up: At [facility], you and your health needs are our priority.  As part of our continuing mission to provide you with exceptional heart care, our providers are all part of one team.  This team includes your primary Cardiologist (physician) and Advanced Practice Providers or APPs (Physician Assistants and Nurse Practitioners) who all work together to provide you with the care you need, when you need it.  Your next appointment:   6 month(s)  Provider:   Lonni LITTIE Nanas, MD   We recommend signing up for the patient portal called MyChart.  Sign up information is provided on this After Visit Summary.  MyChart is used to connect with patients for Virtual Visits (Telemedicine).  Patients are able to view lab/test results, encounter notes, upcoming appointments, etc.  Non-urgent messages can be sent to your provider as well.   To learn more about what you can do with MyChart, go to [email or web address].

## 2024-06-29 ENCOUNTER — Ambulatory Visit: Payer: Medicare Other

## 2024-07-25 ENCOUNTER — Other Ambulatory Visit

## 2024-07-31 ENCOUNTER — Ambulatory Visit: Admitting: Family Medicine
